# Patient Record
Sex: Female | Born: 1950 | Race: White | Hispanic: No | Smoking: Never smoker
Health system: Southern US, Community
[De-identification: ages and names within clinical notes are randomized; demographics above are authoritative.]

## PROBLEM LIST (undated history)

## (undated) DIAGNOSIS — R1031 Right lower quadrant pain: Secondary | ICD-10-CM

## (undated) DIAGNOSIS — I4510 Unspecified right bundle-branch block: Secondary | ICD-10-CM

## (undated) DIAGNOSIS — Z860100 Personal history of colon polyps, unspecified: Secondary | ICD-10-CM

## (undated) DIAGNOSIS — R51 Headache: Secondary | ICD-10-CM

## (undated) DIAGNOSIS — F32A Depression, unspecified: Secondary | ICD-10-CM

## (undated) DIAGNOSIS — R9389 Abnormal findings on diagnostic imaging of other specified body structures: Secondary | ICD-10-CM

## (undated) DIAGNOSIS — M797 Fibromyalgia: Secondary | ICD-10-CM

## (undated) DIAGNOSIS — E109 Type 1 diabetes mellitus without complications: Secondary | ICD-10-CM

## (undated) DIAGNOSIS — I10 Essential (primary) hypertension: Secondary | ICD-10-CM

## (undated) DIAGNOSIS — Z9641 Presence of insulin pump (external) (internal): Secondary | ICD-10-CM

## (undated) DIAGNOSIS — G4700 Insomnia, unspecified: Secondary | ICD-10-CM

## (undated) DIAGNOSIS — Z8639 Personal history of other endocrine, nutritional and metabolic disease: Secondary | ICD-10-CM

## (undated) DIAGNOSIS — F329 Major depressive disorder, single episode, unspecified: Secondary | ICD-10-CM

## (undated) DIAGNOSIS — R42 Dizziness and giddiness: Secondary | ICD-10-CM

## (undated) HISTORY — PX: COLONOSCOPY WITH PROPOFOL: SHX5780

## (undated) HISTORY — DX: Personal history of colonic polyps: Z86.010

## (undated) HISTORY — DX: Depression, unspecified: F32.A

## (undated) HISTORY — DX: Major depressive disorder, single episode, unspecified: F32.9

## (undated) HISTORY — DX: Personal history of colon polyps, unspecified: Z86.0100

## (undated) HISTORY — DX: Essential (primary) hypertension: I10

## (undated) HISTORY — DX: Insomnia, unspecified: G47.00

## (undated) HISTORY — DX: Headache: R51

## (undated) HISTORY — PX: TONSILLECTOMY: SUR1361

## (undated) HISTORY — DX: Fibromyalgia: M79.7

---

## 2003-02-13 ENCOUNTER — Other Ambulatory Visit: Admission: RE | Admit: 2003-02-13 | Discharge: 2003-02-13 | Payer: Self-pay | Admitting: Obstetrics and Gynecology

## 2005-01-13 ENCOUNTER — Other Ambulatory Visit: Admission: RE | Admit: 2005-01-13 | Discharge: 2005-01-13 | Payer: Self-pay | Admitting: Obstetrics and Gynecology

## 2006-02-01 ENCOUNTER — Encounter: Admission: RE | Admit: 2006-02-01 | Discharge: 2006-02-01 | Payer: Self-pay | Admitting: Obstetrics and Gynecology

## 2006-12-15 ENCOUNTER — Ambulatory Visit: Payer: Self-pay | Admitting: Family Medicine

## 2006-12-15 DIAGNOSIS — I10 Essential (primary) hypertension: Secondary | ICD-10-CM

## 2006-12-15 DIAGNOSIS — R51 Headache: Secondary | ICD-10-CM

## 2006-12-15 DIAGNOSIS — F329 Major depressive disorder, single episode, unspecified: Secondary | ICD-10-CM | POA: Insufficient documentation

## 2006-12-15 DIAGNOSIS — F3289 Other specified depressive episodes: Secondary | ICD-10-CM | POA: Insufficient documentation

## 2006-12-15 DIAGNOSIS — R079 Chest pain, unspecified: Secondary | ICD-10-CM

## 2006-12-15 DIAGNOSIS — R519 Headache, unspecified: Secondary | ICD-10-CM | POA: Insufficient documentation

## 2006-12-15 DIAGNOSIS — Z860100 Personal history of colon polyps, unspecified: Secondary | ICD-10-CM | POA: Insufficient documentation

## 2006-12-15 DIAGNOSIS — G4700 Insomnia, unspecified: Secondary | ICD-10-CM

## 2006-12-15 DIAGNOSIS — E785 Hyperlipidemia, unspecified: Secondary | ICD-10-CM | POA: Insufficient documentation

## 2006-12-17 ENCOUNTER — Telehealth: Payer: Self-pay | Admitting: Family Medicine

## 2007-01-03 ENCOUNTER — Encounter: Admission: RE | Admit: 2007-01-03 | Discharge: 2007-01-03 | Payer: Self-pay | Admitting: Obstetrics and Gynecology

## 2007-08-29 ENCOUNTER — Telehealth: Payer: Self-pay | Admitting: Family Medicine

## 2007-10-05 ENCOUNTER — Ambulatory Visit: Payer: Self-pay | Admitting: Family Medicine

## 2007-10-10 LAB — CONVERTED CEMR LAB
Albumin: 4 g/dL (ref 3.5–5.2)
Alkaline Phosphatase: 63 units/L (ref 39–117)
BUN: 17 mg/dL (ref 6–23)
Basophils Absolute: 0 10*3/uL (ref 0.0–0.1)
Cholesterol: 234 mg/dL (ref 0–200)
Eosinophils Absolute: 0.2 10*3/uL (ref 0.0–0.7)
Eosinophils Relative: 2.8 % (ref 0.0–5.0)
GFR calc Af Amer: 95 mL/min
GFR calc non Af Amer: 78 mL/min
HCT: 42.4 % (ref 36.0–46.0)
MCHC: 35.5 g/dL (ref 30.0–36.0)
MCV: 88.1 fL (ref 78.0–100.0)
Monocytes Absolute: 0.4 10*3/uL (ref 0.1–1.0)
Platelets: 225 10*3/uL (ref 150–400)
Potassium: 4 meq/L (ref 3.5–5.1)
RDW: 12.8 % (ref 11.5–14.6)
Sodium: 141 meq/L (ref 135–145)
TSH: 1 microintl units/mL (ref 0.35–5.50)
Total Bilirubin: 1 mg/dL (ref 0.3–1.2)
Triglycerides: 96 mg/dL (ref 0–149)

## 2007-11-01 ENCOUNTER — Telehealth: Payer: Self-pay | Admitting: Family Medicine

## 2008-03-21 ENCOUNTER — Telehealth: Payer: Self-pay | Admitting: Family Medicine

## 2008-05-17 ENCOUNTER — Ambulatory Visit: Payer: Self-pay | Admitting: Family Medicine

## 2008-05-17 LAB — CONVERTED CEMR LAB
Blood in Urine, dipstick: NEGATIVE
Glucose, Urine, Semiquant: NEGATIVE
Specific Gravity, Urine: 1.02
WBC Urine, dipstick: NEGATIVE
pH: 7

## 2008-05-21 ENCOUNTER — Encounter: Payer: Self-pay | Admitting: Family Medicine

## 2008-05-21 LAB — CONVERTED CEMR LAB
ALT: 18 units/L (ref 0–35)
AST: 18 units/L (ref 0–37)
Albumin: 3.6 g/dL (ref 3.5–5.2)
BUN: 17 mg/dL (ref 6–23)
Basophils Absolute: 0 10*3/uL (ref 0.0–0.1)
Chloride: 108 meq/L (ref 96–112)
Direct LDL: 124.5 mg/dL
Glucose, Bld: 97 mg/dL (ref 70–99)
Lymphocytes Relative: 29.7 % (ref 12.0–46.0)
Lymphs Abs: 1.6 10*3/uL (ref 0.7–4.0)
Monocytes Relative: 6.9 % (ref 3.0–12.0)
Platelets: 186 10*3/uL (ref 150.0–400.0)
Potassium: 3.9 meq/L (ref 3.5–5.1)
RDW: 12.6 % (ref 11.5–14.6)
TSH: 1.65 microintl units/mL (ref 0.35–5.50)

## 2008-05-29 ENCOUNTER — Telehealth (INDEPENDENT_AMBULATORY_CARE_PROVIDER_SITE_OTHER): Payer: Self-pay | Admitting: *Deleted

## 2008-07-20 ENCOUNTER — Telehealth: Payer: Self-pay | Admitting: Family Medicine

## 2008-09-05 ENCOUNTER — Telehealth: Payer: Self-pay | Admitting: Family Medicine

## 2008-09-10 ENCOUNTER — Telehealth (INDEPENDENT_AMBULATORY_CARE_PROVIDER_SITE_OTHER): Payer: Self-pay | Admitting: *Deleted

## 2008-10-25 ENCOUNTER — Telehealth: Payer: Self-pay | Admitting: Family Medicine

## 2008-11-19 ENCOUNTER — Ambulatory Visit: Payer: Self-pay | Admitting: Family Medicine

## 2008-11-19 DIAGNOSIS — IMO0001 Reserved for inherently not codable concepts without codable children: Secondary | ICD-10-CM | POA: Insufficient documentation

## 2008-11-23 ENCOUNTER — Telehealth: Payer: Self-pay | Admitting: *Deleted

## 2008-12-31 ENCOUNTER — Telehealth: Payer: Self-pay | Admitting: Family Medicine

## 2009-01-07 ENCOUNTER — Telehealth: Payer: Self-pay | Admitting: Family Medicine

## 2009-01-11 ENCOUNTER — Ambulatory Visit: Payer: Self-pay | Admitting: Family Medicine

## 2009-01-31 ENCOUNTER — Telehealth (INDEPENDENT_AMBULATORY_CARE_PROVIDER_SITE_OTHER): Payer: Self-pay | Admitting: *Deleted

## 2009-02-04 ENCOUNTER — Ambulatory Visit: Payer: Self-pay

## 2009-02-04 ENCOUNTER — Encounter (HOSPITAL_COMMUNITY): Admission: RE | Admit: 2009-02-04 | Discharge: 2009-04-01 | Payer: Self-pay | Admitting: Family Medicine

## 2009-02-04 ENCOUNTER — Ambulatory Visit: Payer: Self-pay | Admitting: Family Medicine

## 2009-02-04 ENCOUNTER — Ambulatory Visit: Payer: Self-pay | Admitting: Cardiology

## 2009-02-04 ENCOUNTER — Telehealth: Payer: Self-pay | Admitting: Family Medicine

## 2009-02-06 ENCOUNTER — Ambulatory Visit: Payer: Self-pay | Admitting: Family Medicine

## 2009-02-06 LAB — CONVERTED CEMR LAB
AST: 25 units/L (ref 0–37)
Albumin: 3.5 g/dL (ref 3.5–5.2)
Basophils Absolute: 0 10*3/uL (ref 0.0–0.1)
Bilirubin Urine: NEGATIVE
Chloride: 106 meq/L (ref 96–112)
Cholesterol: 226 mg/dL — ABNORMAL HIGH (ref 0–200)
Creatinine, Ser: 0.9 mg/dL (ref 0.4–1.2)
Direct LDL: 145.5 mg/dL
Eosinophils Relative: 2.4 % (ref 0.0–5.0)
GFR calc non Af Amer: 67.82 mL/min (ref >60)
Glucose, Urine, Semiquant: NEGATIVE
HCT: 45.7 % (ref 36.0–46.0)
Ketones, urine, test strip: NEGATIVE
Lymphocytes Relative: 31.3 % (ref 12.0–46.0)
Monocytes Relative: 6.7 % (ref 3.0–12.0)
Neutrophils Relative %: 58.9 % (ref 43.0–77.0)
Platelets: 213 10*3/uL (ref 150.0–400.0)
Potassium: 4 meq/L (ref 3.5–5.1)
Specific Gravity, Urine: >=1.03
TSH: 2.51 microintl units/mL (ref 0.35–5.50)
Total CHOL/HDL Ratio: 4
Total Protein: 6.8 g/dL (ref 6.0–8.3)
Triglycerides: 161 mg/dL — ABNORMAL HIGH (ref 0.0–149.0)
WBC: 5.8 10*3/uL (ref 4.5–10.5)

## 2009-02-07 ENCOUNTER — Encounter (INDEPENDENT_AMBULATORY_CARE_PROVIDER_SITE_OTHER): Payer: Self-pay | Admitting: *Deleted

## 2009-02-13 ENCOUNTER — Telehealth: Payer: Self-pay | Admitting: Family Medicine

## 2009-02-14 ENCOUNTER — Encounter (INDEPENDENT_AMBULATORY_CARE_PROVIDER_SITE_OTHER): Payer: Self-pay | Admitting: *Deleted

## 2009-02-20 ENCOUNTER — Ambulatory Visit: Payer: Self-pay | Admitting: Internal Medicine

## 2009-06-07 ENCOUNTER — Encounter: Admission: RE | Admit: 2009-06-07 | Discharge: 2009-06-07 | Payer: Self-pay | Admitting: Obstetrics and Gynecology

## 2009-08-08 ENCOUNTER — Telehealth: Payer: Self-pay | Admitting: Family Medicine

## 2009-12-31 ENCOUNTER — Encounter: Admission: RE | Admit: 2009-12-31 | Discharge: 2009-12-31 | Payer: Self-pay | Admitting: Obstetrics and Gynecology

## 2010-02-23 LAB — CONVERTED CEMR LAB
Albumin: 3.9 g/dL (ref 3.5–5.2)
Basophils Absolute: 0 10*3/uL (ref 0.0–0.1)
Bilirubin Urine: NEGATIVE
Bilirubin, Direct: 0.1 mg/dL (ref 0.0–0.3)
Blood in Urine, dipstick: NEGATIVE
Calcium: 9.5 mg/dL (ref 8.4–10.5)
Cholesterol: 199 mg/dL (ref 0–200)
Eosinophils Absolute: 0.1 10*3/uL (ref 0.0–0.6)
Eosinophils Relative: 1.7 % (ref 0.0–5.0)
GFR calc Af Amer: 95 mL/min
GFR calc non Af Amer: 78 mL/min
Glucose, Bld: 86 mg/dL (ref 70–99)
HDL: 53.2 mg/dL (ref >39.0)
Ketones, urine, test strip: NEGATIVE
Lymphocytes Relative: 32.2 % (ref 12.0–46.0)
MCHC: 34.8 g/dL (ref 30.0–36.0)
MCV: 87.9 fL (ref 78.0–100.0)
Neutro Abs: 4.6 10*3/uL (ref 1.4–7.7)
Neutrophils Relative %: 59.4 % (ref 43.0–77.0)
Platelets: 229 10*3/uL (ref 150–400)
RBC: 4.83 M/uL (ref 3.87–5.11)
Sodium: 140 meq/L (ref 135–145)
Specific Gravity, Urine: >=1.03
TSH: 1.41 microintl units/mL (ref 0.35–5.50)
Total CHOL/HDL Ratio: 3.7
Triglycerides: 122 mg/dL (ref 0–149)
WBC: 7.6 10*3/uL (ref 4.5–10.5)
pH: 5

## 2010-02-25 NOTE — Assessment & Plan Note (Signed)
Summary: CPX/MHF/RSC/CJR/pt rsc/cjr pt rsc/njr   Vital Signs:  Patient profile:   60 year old female Height:      64.25 inches Weight:      236 pounds Temp:     98.0 degrees F oral Pulse rate:   103 / minute BP sitting:   108 / 80  (left arm) Cuff size:   large  Vitals Entered By: Alfred Levins, CMA (February 06, 2009 9:32 AM) CC: cpx, no pap   History of Present Illness: 60 yr old female for cpx. She feels great today. She was seen last month for some chest discomfort that we felt was due to GERD. Indeed after starting Omeprazole daily these symptoms have disappeared. Her stress test was normal.   Current Medications (verified): 1)  Ambien 10 Mg  Tabs (Zolpidem Tartrate) .... At Bedtime 2)  Hyzaar 50-12.5 Mg Tabs (Losartan Potassium-Hctz) .... Take 1 By Mouth Once Daily. 3)  Flexeril 10 Mg Tabs (Cyclobenzaprine Hcl) .... Three Times A Day As Needed Spasm 4)  Omeprazole 40 Mg Cpdr (Omeprazole) .... Once Daily  Allergies (verified): No Known Drug Allergies  Past History:  Past Medical History: Colonic polyps, hx of Depression Headache Hypertension insomnia normal stress test on 02-04-09 sees Dr. Henderson Cloud for GYN exams  Past Surgical History: Tonsillectomy colonoscopy 02-2006 showing benign polyps  Family History: Reviewed history from 12/15/2006 and no changes required. Family History of Alcoholism/Addiction Family History Breast cancer 1st degree relative <50 Family History Uterine cancer Family History of Heart disease Family History of Aneurysm Abdominal  Social History: Reviewed history from 12/15/2006 and no changes required. Divorced Never Smoked Alcohol use-no Drug use-no Regular exercise-no  Review of Systems  The patient denies anorexia, fever, weight loss, weight gain, vision loss, decreased hearing, hoarseness, chest pain, syncope, dyspnea on exertion, peripheral edema, prolonged cough, headaches, hemoptysis, abdominal pain, melena, hematochezia,  severe indigestion/heartburn, hematuria, incontinence, genital sores, muscle weakness, suspicious skin lesions, transient blindness, difficulty walking, depression, unusual weight change, abnormal bleeding, enlarged lymph nodes, angioedema, breast masses, and testicular masses.    Physical Exam  General:  overweight-appearing.   Head:  Normocephalic and atraumatic without obvious abnormalities. No apparent alopecia or balding. Eyes:  No corneal or conjunctival inflammation noted. EOMI. Perrla. Funduscopic exam benign, without hemorrhages, exudates or papilledema. Vision grossly normal. Ears:  External ear exam shows no significant lesions or deformities.  Otoscopic examination reveals clear canals, tympanic membranes are intact bilaterally without bulging, retraction, inflammation or discharge. Hearing is grossly normal bilaterally. Nose:  External nasal examination shows no deformity or inflammation. Nasal mucosa are pink and moist without lesions or exudates. Mouth:  Oral mucosa and oropharynx without lesions or exudates.  Teeth in good repair. Neck:  No deformities, masses, or tenderness noted. Chest Wall:  No deformities, masses, or tenderness noted. Lungs:  Normal respiratory effort, chest expands symmetrically. Lungs are clear to auscultation, no crackles or wheezes. Heart:  Normal rate and regular rhythm. S1 and S2 normal without gallop, murmur, click, rub or other extra sounds. Abdomen:  Bowel sounds positive,abdomen soft and non-tender without masses, organomegaly or hernias noted. Msk:  No deformity or scoliosis noted of thoracic or lumbar spine.   Pulses:  R and L carotid,radial,femoral,dorsalis pedis and posterior tibial pulses are full and equal bilaterally Extremities:  No clubbing, cyanosis, edema, or deformity noted with normal full range of motion of all joints.   Neurologic:  No cranial nerve deficits noted. Station and gait are normal. Plantar reflexes are down-going bilaterally.  DTRs are symmetrical throughout. Sensory, motor and coordinative functions appear intact. Skin:  Intact without suspicious lesions or rashes Cervical Nodes:  No lymphadenopathy noted Axillary Nodes:  No palpable lymphadenopathy Inguinal Nodes:  No significant adenopathy Psych:  Cognition and judgment appear intact. Alert and cooperative with normal attention span and concentration. No apparent delusions, illusions, hallucinations   Impression & Recommendations:  Problem # 1:  HEALTH MAINTENANCE EXAM (ICD-V70.0)  Orders: UA Dipstick w/o Micro (manual) (56213)  Complete Medication List: 1)  Hyzaar 50-12.5 Mg Tabs (Losartan potassium-hctz) .... Take 1 by mouth once daily. 2)  Flexeril 10 Mg Tabs (Cyclobenzaprine hcl) .... Three times a day as needed spasm 3)  Temazepam 30 Mg Caps (Temazepam) .... At bedtime 4)  Omeprazole 20 Mg Tbec (Omeprazole) .... Once daily 5)  Prozac 10 Mg Caps (Fluoxetine hcl) .... Once daily 6)  Zocor 40 Mg Tabs (Simvastatin) .... Once daily  Other Orders: Gastroenterology Referral (GI)  Patient Instructions: 1)  It is important that you exercise reguarly at least 20 minutes 5 times a week. If you develop chest pain, have severe difficulty breathing, or feel very tired, stop exercising immediately and seek medical attention.  2)  You need to lose weight. Consider a lower calorie diet and regular exercise.  3)  Add Zocor, and recheck lipids in 90 days.  4)  switch from Ambien to Temazepam so insurance will cover it.  Prescriptions: ZOCOR 40 MG TABS (SIMVASTATIN) once daily  #30 x 11   Entered and Authorized by:   Nelwyn Salisbury MD   Signed by:   Nelwyn Salisbury MD on 02/06/2009   Method used:   Print then Give to Patient   RxID:   [id number] PROZAC 10 MG CAPS (FLUOXETINE HCL) once daily  #90 x 3   Entered and Authorized by:   Nelwyn Salisbury MD   Signed by:   Nelwyn Salisbury MD on 02/06/2009   Method used:   Print then Give to Patient   RxID:    [id number] QIHKVQ 50-12.5 MG TABS (LOSARTAN POTASSIUM-HCTZ) take 1 by mouth once daily.  #30 x 11   Entered and Authorized by:   Nelwyn Salisbury MD   Signed by:   Nelwyn Salisbury MD on 02/06/2009   Method used:   Print then Give to Patient   RxID:   [id number] TEMAZEPAM 30 MG CAPS (TEMAZEPAM) at bedtime  #30 x 5   Entered and Authorized by:   Nelwyn Salisbury MD   Signed by:   Nelwyn Salisbury MD on 02/06/2009   Method used:   Print then Give to Patient   RxID:   [id number]   Preventive Care Screening  Mammogram:    Date:  01/27/2008    Results:  normal   Last Tetanus Booster:    Date:  01/27/2003    Results:  Historical    Laboratory Results   Urine Tests  Date/Time Received: February 06, 2009 10:33 AM Date/Time Reported: February 06, 2009 10:33 AM  Routine Urinalysis   Color: lt. yellow Appearance: Clear Glucose: negative   (Normal Range: Negative) Bilirubin: negative   (Normal Range: Negative) Ketone: negative   (Normal Range: Negative) Spec. Gravity: >=1.030   (Normal Range: 1.003-1.035) Blood: negative   (Normal Range: Negative) pH: 5.0   (Normal Range: 5.0-8.0) Protein: negative   (Normal Range: Negative) Urobilinogen: 0.2   (Normal Range: 0-1) Nitrite: negative   (Normal Range: Negative) Leukocyte Esterace: negative   (Normal Range:  Negative)    Comments: Alfred Levins, CMA  February 06, 2009 10:33 AM

## 2010-02-25 NOTE — Miscellaneous (Signed)
Summary: [facility]  Clinical Lists Changes  Medications: Added new medication of MIRALAX   POWD (POLYETHYLENE GLYCOL 3350) As per prep  instructions. - Signed Added new medication of DULCOLAX 5 MG  TBEC (BISACODYL) Day before procedure take 2 at 3pm and 2 at 8pm. - Signed Added new medication of REGLAN 10 MG  TABS (METOCLOPRAMIDE HCL) As per prep instructions. - Signed Rx of MIRALAX   POWD (POLYETHYLENE GLYCOL 3350) As per prep  instructions.;  #255gm x 0;  Signed;  Entered by: Ezra Sites RN;  Authorized by: Hart Carwin MD;  Method used: Electronically to [facility] [address] [phone]*, [address], [place], [place], [place]  [address], Ph: [phone] or [phone], Fax: [phone] Rx of DULCOLAX 5 MG  TBEC (BISACODYL) Day before procedure take 2 at 3pm and 2 at 8pm.;  #4 x 0;  Signed;  Entered by: Ezra Sites RN;  Authorized by: Hart Carwin MD;  Method used: Electronically to [facility] [address] [phone]*, [address], [place], [place], [place]  [address], Ph: [phone] or [phone], Fax: [phone] Rx of REGLAN 10 MG  TABS (METOCLOPRAMIDE HCL) As per prep instructions.;  #2 x 0;  Signed;  Entered by: Ezra Sites RN;  Authorized by: Hart Carwin MD;  Method used: Electronically to [facility] [address] [phone]*, [address], [place], [place], [place]  [address], Ph: [phone] or [phone], Fax: [phone] Observations: Added new observation of NKA: T (02/20/2009 10:28)    Prescriptions: REGLAN 10 MG  TABS (METOCLOPRAMIDE HCL) As per prep instructions.  #2 x 0   Entered by:   Ezra Sites RN   Authorized by:   Hart Carwin MD   Signed by:   Ezra Sites RN on 02/20/2009   Method used:   Electronically to        [facility]. [address] [phone]* (retail)       [address]       [place], [place]  [address]       Ph: [phone] or [phone]       Fax: [phone]   RxID:   [id number] DULCOLAX 5 MG  TBEC (BISACODYL) Day before procedure  take 2 at 3pm and 2 at 8pm.  #4 x 0   Entered by:   Ezra Sites RN   Authorized by:   Hart Carwin MD   Signed by:   Ezra Sites RN on 02/20/2009   Method used:   Electronically to        [facility] [address] [phone]* (retail)       [address]       [place], [place]  [address]       Ph: [phone] or [phone]       Fax: [phone]   RxID:   [id number] MIRALAX   POWD (POLYETHYLENE GLYCOL 3350) As per prep  instructions.  #255gm x 0   Entered by:   Ezra Sites RN   Authorized by:   Hart Carwin MD   Signed by:   Ezra Sites RN on 02/20/2009   Method used:   Electronically to        [facility]. [address] [phone]* (retail)       [address]       [place], [place]  [address]       Ph: [phone] or [phone]       Fax: [phone]   RxID:   [id number]

## 2010-02-25 NOTE — Progress Notes (Signed)
Summary: Nuclear Pre-Procedure  Phone Note Outgoing Call Call back at [facility] Phone [phone]   Call placed by: Stanton Kidney, EMT-P,  January 31, 2009 1:32 PM Action Taken: Phone Call Completed Summary of Call: Left message with information on Myoview Information Sheet (see scanned document for details).     Nuclear Med Background Indications for Stress Test: Evaluation for Ischemia     Symptoms: Chest Pressure, Chest Tightness, SOB    Nuclear Pre-Procedure Cardiac Risk Factors: Hypertension, Lipids Height (in): 65

## 2010-02-25 NOTE — Letter (Signed)
Summary: Previsit letter  [facility]  [address] [place], [place] [address]   Phone: [phone]  Fax: [phone]       02/07/2009 MRN: [id number]  [facility] Katie Palmer [address] [place], [place]  [address]  Dear [name],  Welcome to the Gastroenterology Division at [facility].    You are scheduled to see a nurse for your pre-procedure visit on 02/18/2009 at 2:30PM on the 3rd floor at [facility], [address]  We ask that you try to arrive at our office 15 minutes prior to your appointment time to allow for check-in.  Your nurse visit will consist of discussing your medical and surgical history, your immediate family medical history, and your medications.    Please bring a complete list of all your medications or, if you prefer, bring the medication bottles and we will list them.  We will need to be aware of both prescribed and over the counter drugs.  We will need to know exact dosage information as well.  If you are on blood thinners (Coumadin, Plavix, Aggrenox, Ticlid, etc.) please call our office today/prior to your appointment, as we need to consult with your physician about holding your medication.   Please be prepared to read and sign documents such as consent forms, a financial agreement, and acknowledgement forms.  If necessary, and with your consent, a friend or relative is welcome to sit-in on the nurse visit with you.  Please bring your insurance card so that we may make a copy of it.  If your insurance requires a referral to see a specialist, please bring your referral form from your primary care physician.  No co-pay is required for this nurse visit.     If you cannot keep your appointment, please call [phone] to cancel or reschedule prior to your appointment date.  This allows [place] the opportunity to schedule an appointment for another patient in need of care.    Thank you for choosing [facility] for your medical needs.   We appreciate the opportunity to care for you.  Please visit [place] at our website  to learn more about our practice.                     Sincerely.                                                                                                                   The Gastroenterology Division

## 2010-02-25 NOTE — Progress Notes (Signed)
Summary: pharmacy needs to verify dose  Phone Note From Pharmacy Call back at [phone]   Caller: [facility] [address] [phone]* Call For: Katie Palmer  Summary of Call: pt was on celexa 20mg  and it was changed to Prozac 10mg  but back in 2009 Dr Henderson Cloud gave her 20mg  and pharmacy is calling to see if you wanted to increase the dose Initial call taken by: Alfred Levins, CMA,  February 13, 2009 12:04 PM  Follow-up for Phone Call        change the Prozac to 20 mg a day Follow-up by: Nelwyn Salisbury MD,  February 13, 2009 1:50 PM  Additional Follow-up for Phone Call Additional follow up Details #1::        Phone call completed, Pharmacist called Additional Follow-up by: Alfred Levins, CMA,  February 13, 2009 5:03 PM    New/Updated Medications: FLUOXETINE HCL 20 MG  TABS (FLUOXETINE HCL) Take 1 tab by mouth daily Prescriptions: FLUOXETINE HCL 20 MG  TABS (FLUOXETINE HCL) Take 1 tab by mouth daily  #90 x 3   Entered by:   Alfred Levins, CMA   Authorized by:   Nelwyn Salisbury MD   Signed by:   Alfred Levins, CMA on 02/13/2009   Method used:   Electronically to        [facility] [address] [phone]* (retail)       [address]       [place], [place]  [address]       Ph: [phone] or [phone]       Fax: [phone]   RxID:   [id number]

## 2010-02-25 NOTE — Letter (Signed)
Summary: [facility] Instructions  [facility]  [address] [place], [place] [address]   Phone: [phone]  Fax: [phone]       Katie Palmer    1950-06-12    MRN: [id number]       Procedure Day /Date: Thursday 02/28/09       Arrival Time:  8:00 AM       Procedure Time: 9:00 AM      Location of Procedure:                    [facility] (4th Floor)    PREPARATION FOR COLONOSCOPY WITH MIRALAX  Starting 5 days prior to your procedure Saturday 02/23/09  do not eat nuts, seeds, popcorn, corn, beans, peas,  salads, or any raw vegetables.  Do not take any fiber supplements (e.g. Metamucil, Citrucel, and Benefiber). ____________________________________________________________________________________________________   THE DAY BEFORE YOUR PROCEDURE         DATE:  02/27/09   DAY:  Wednesday   1   Drink clear liquids the entire day-NO SOLID FOOD  2   Do not drink anything colored red or purple.  Avoid juices with pulp.  No orange juice.  3   Drink at least 64 oz. (8 glasses) of fluid/clear liquids during the day to prevent dehydration and help the prep work efficiently.  CLEAR LIQUIDS INCLUDE: Water Jello Ice Popsicles Tea (sugar ok, no milk/cream) Powdered fruit flavored drinks Coffee (sugar ok, no milk/cream) Gatorade Juice: apple, white grape, white cranberry  Lemonade Clear bullion, consomm, broth Carbonated beverages (any kind) Strained chicken noodle soup Hard Candy  4   Mix the entire bottle of Miralax with 64 oz. of Gatorade/Powerade in the morning and put in the refrigerator to chill.  5   At 3:00 pm take 2 Dulcolax/Bisacodyl tablets.  6   At 4:30 pm take one Reglan/Metoclopramide tablet.  7  Starting at 5:00 pm drink one 8 oz glass of the Miralax mixture every 15-20 minutes until you have finished drinking the entire 64 oz.  You should finish drinking prep around 7:30 or 8:00 pm.  8   If you are nauseated, you may take the 2nd  Reglan/Metoclopramide tablet at 6:30 pm.        9    At 8:00 pm take 2 more DULCOLAX/Bisacodyl tablets.     THE DAY OF YOUR PROCEDURE      DATE:  _2/3/11  _ DAY: _Thursday  _  You may drink clear liquids until  7:00 AM    (2 HOURS BEFORE PROCEDURE).   MEDICATION INSTRUCTIONS  Unless otherwise instructed, you should take regular prescription medications with a small sip of water as early as possible the morning of your procedure.   Additional medication instructions: Do not take Losartin/HCTZ morning of procedure.         OTHER INSTRUCTIONS  You will need a responsible adult at least 62 years of age to accompany you and drive you home.   This person must remain in the waiting room during your procedure.  Wear loose fitting clothing that is easily removed.  Leave jewelry and other valuables at home.  However, you may wish to bring a book to read or an iPod/MP3 player to listen to music as you wait for your procedure to start.  Remove all body piercing jewelry and leave at home.  Total time from sign-in until discharge is approximately 2-3 hours.  You should go home  directly after your procedure and rest.  You can resume normal activities the day after your procedure.  The day of your procedure you should not:   Drive   Make legal decisions   Operate machinery   Drink alcohol   Return to work  You will receive specific instructions about eating, activities and medications before you leave.   The above instructions have been reviewed and explained to me by   Ezra Sites RN  February 20, 2009 10:50 AM     I fully understand and can verbalize these instructions _____________________________ Date _______

## 2010-02-25 NOTE — Progress Notes (Signed)
Summary: refill Temazepam  Phone Note Refill Request Message from:  Fax from Pharmacy on August 08, 2009 10:53 AM  Refills Requested: Medication #1:  TEMAZEPAM 30 MG CAPS at bedtime   Dosage confirmed as above?Dosage Confirmed   Supply Requested: 3 months   Last Refilled: 07/08/2009   Notes: 90 day supply  Method Requested: Fax to Local Pharmacy Initial call taken by: Raechel Ache, RN,  August 08, 2009 10:53 AM [facility]: [facility]  Follow-up for Phone Call        call in #90 x3 Follow-up by: Nelwyn Salisbury MD,  August 08, 2009 12:08 PM  Additional Follow-up for Phone Call Additional follow up Details #1::        Rx faxed to pharmacy    Prescriptions: TEMAZEPAM 30 MG CAPS (TEMAZEPAM) at bedtime  #90 x 3   Entered by:   Raechel Ache, RN   Authorized by:   Nelwyn Salisbury MD   Signed by:   Raechel Ache, RN on 08/08/2009   Method used:   Historical   RxID:   [id number]

## 2010-02-25 NOTE — Assessment & Plan Note (Signed)
Summary: stress only/dx:chest pain/wt:239/ins:bcbs/dr fry  Nuclear Med Background Indications for Stress Test: Evaluation for Ischemia    History Comments: NO DOCUMENTED CAD  Symptoms: Chest Pressure, Chest Tightness, Fatigue, SOB  Symptoms Comments: Last episode of CP:2 weeks ago.   Nuclear Pre-Procedure Cardiac Risk Factors: Hypertension, Lipids, Obesity Caffeine/Decaff Intake: None NPO After: 7:30 PM Lungs: Clear IV 0.9% NS with Angio Cath: 20g     IV Site: (R) AC IV Started by: Stanton Kidney EMT-P Chest Size (in) 40     Cup Size DD     Height (in): 65 Weight (lb): 234 BMI: 39.08  Nuclear Med Study 1 or 2 day study:  1 day     Stress Test Type:  Stress Reading MD:  Olga Millers, MD     Referring MD:  Gershon Crane, MD Resting Radionuclide:  Technetium 16m Tetrofosmin     Resting Radionuclide Dose:  11.0 mCi  Stress Radionuclide:  Technetium 58m Tetrofosmin     Stress Radionuclide Dose:  33.0 mCi   Stress Protocol Exercise Time (min):  8:31 min     Max HR:  162 bpm     Predicted Max HR:  160 bpm  Max Systolic BP: 172 mm Hg     Percent Max HR:  101.25 %     METS: 10.4 Rate Pressure Product:  45409    Stress Test Technologist:  Rea [employer] CMA-N     Nuclear Technologist:  Burna Mortimer Deal RT-N  Rest Procedure  Myocardial perfusion imaging was performed at rest 45 minutes following the intravenous administration of Myoview Technetium 92m Tetrofosmin.  Stress Procedure  The patient exercised for 8:31.  The patient stopped due to fatigue and denied any chest pain.  There were no diagnostic ST-T wave changes.  Myoview was injected at peak exercise and myocardial perfusion imaging was performed after a brief delay.  QPS Raw Data Images:  Acuisition technically good; normal left ventricular size. Stress Images:  There is normal uptake in all areas. Rest Images:  Normal homogeneous uptake in all areas of the myocardium. Subtraction (SDS):  No evidence of ischemia. Transient  Ischemic Dilatation:  .90  (Normal <1.22)  Lung/Heart Ratio:  .31  (Normal <0.45)  Quantitative Gated Spect Images QGS EDV:  56 ml QGS ESV:  14 ml QGS EF:  75 % QGS cine images:  Normal wall motion.   Overall Impression  Exercise Capacity: Fair exercise capacity. BP Response: Normal blood pressure response. Clinical Symptoms: There is dyspnea. ECG Impression: No significant ST segment change suggestive of ischemia. Overall Impression: There is no sign of scar or ischemia.

## 2010-02-25 NOTE — Miscellaneous (Signed)
Summary: Labs Ordered  Clinical Lists Changes  Problems: Added new problem of HEALTH SCREENING (ICD-V70.0) Orders: Added new Test order of TLB-CBC Platelet - w/Differential (85025-CBCD) - Signed Added new Test order of TLB-BMP (Basic Metabolic Panel-BMET) (80048-METABOL) - Signed Added new Test order of TLB-TSH (Thyroid Stimulating Hormone) (84443-TSH) - Signed Added new Test order of TLB-Hepatic/Liver Function Pnl (80076-HEPATIC) - Signed

## 2010-02-25 NOTE — Progress Notes (Signed)
  Phone Note From Other Clinic   Caller: Nurse (Patsy w/ [facility]) / Merita Norton - Lab Request: Talk with Provider Action Taken: Provider Notified Summary of Call: Patsy w/ [facility] called to adv that pt was at their facility today for a nuc med study / stress test and since they were starting an IV on the pt, they would go ahead and do bld draw so that pt will not have to come over to have CPX Labs drawn at [facility]...... Pt will have CPX Labs (CBC diff, BMET, TSH, Lipid, Hepatic Panel).... Pt will have UA done at time of her appt for CPX w/ Dr. Clent Ridges..... Merita Norton at [facility] aware of same per Patsy.  Initial call taken by: Debbra Riding,  February 04, 2009 9:08 AM

## 2010-03-18 ENCOUNTER — Other Ambulatory Visit: Payer: Self-pay

## 2010-03-19 MED ORDER — TEMAZEPAM 30 MG PO CAPS: 30.0000 mg | ORAL_CAPSULE | Freq: Every evening | ORAL | Status: AC | PRN

## 2010-03-19 NOTE — Telephone Encounter (Signed)
Temazepam called to [facility]  #30 w/5 refills

## 2010-03-19 NOTE — Telephone Encounter (Signed)
Call in #30 with 5 rf 

## 2010-04-30 ENCOUNTER — Other Ambulatory Visit: Payer: Self-pay | Admitting: Family Medicine

## 2010-07-21 ENCOUNTER — Other Ambulatory Visit: Payer: Self-pay | Admitting: Family Medicine

## 2010-08-05 ENCOUNTER — Encounter: Payer: Self-pay | Admitting: Cardiology

## 2010-09-23 ENCOUNTER — Telehealth: Payer: Self-pay | Admitting: Family Medicine

## 2010-09-23 NOTE — Telephone Encounter (Signed)
Refill request for Temazepam 30 mg take 1 po qhs. Pt last here on 02/06/09 and script last filled on 08/22/10.

## 2010-09-24 ENCOUNTER — Other Ambulatory Visit: Payer: Self-pay | Admitting: Family Medicine

## 2010-09-24 NOTE — Telephone Encounter (Signed)
NO refills until she sees [place] again

## 2010-09-24 NOTE — Telephone Encounter (Signed)
Left below message. 

## 2010-09-24 NOTE — Telephone Encounter (Signed)
Refused, pt needs office visit and pt aware.

## 2010-11-13 ENCOUNTER — Other Ambulatory Visit: Payer: Self-pay | Admitting: Family Medicine

## 2010-11-13 NOTE — Telephone Encounter (Signed)
Refill request for Hyzaar 50-12.5 mg and pt has not been seen since 02/06/10 and took last pill today.

## 2010-11-13 NOTE — Telephone Encounter (Signed)
Pt called req refill of losartan-hydrochlorothiazide (HYZAAR) 50-12.5 MG per tablet to [facility]. Pt took last pill today.

## 2010-11-14 MED ORDER — LOSARTAN POTASSIUM-HCTZ 50-12.5 MG PO TABS: 1.0000 | ORAL_TABLET | Freq: Every day | ORAL | Status: DC

## 2010-11-14 NOTE — Telephone Encounter (Signed)
Call in one year supply  

## 2010-11-14 NOTE — Telephone Encounter (Signed)
Script sent e-scribe 

## 2010-12-12 ENCOUNTER — Other Ambulatory Visit: Payer: Self-pay

## 2010-12-15 ENCOUNTER — Other Ambulatory Visit (INDEPENDENT_AMBULATORY_CARE_PROVIDER_SITE_OTHER): Payer: BC Managed Care – PPO

## 2010-12-15 DIAGNOSIS — Z0000 Encounter for general adult medical examination without abnormal findings: Secondary | ICD-10-CM

## 2010-12-15 LAB — HEPATIC FUNCTION PANEL
ALT: 17 U/L (ref 0–35)
AST: 20 U/L (ref 0–37)
Alkaline Phosphatase: 80 U/L (ref 39–117)
Total Bilirubin: 0.6 mg/dL (ref 0.3–1.2)

## 2010-12-15 LAB — LIPID PANEL
Cholesterol: 188 mg/dL (ref 0–200)
LDL Cholesterol: 112 mg/dL — ABNORMAL HIGH (ref 0–99)
VLDL: 14.8 mg/dL (ref 0.0–40.0)

## 2010-12-15 LAB — CBC WITH DIFFERENTIAL/PLATELET
Basophils Absolute: 0 10*3/uL (ref 0.0–0.1)
Eosinophils Relative: 2.5 % (ref 0.0–5.0)
HCT: 44.9 % (ref 36.0–46.0)
Hemoglobin: 15.3 g/dL — ABNORMAL HIGH (ref 12.0–15.0)
Lymphocytes Relative: 30.1 % (ref 12.0–46.0)
Lymphs Abs: 1.7 10*3/uL (ref 0.7–4.0)
Monocytes Relative: 6.5 % (ref 3.0–12.0)
Neutro Abs: 3.5 10*3/uL (ref 1.4–7.7)
Platelets: 223 10*3/uL (ref 150.0–400.0)
RDW: 14 % (ref 11.5–14.6)
WBC: 5.8 10*3/uL (ref 4.5–10.5)

## 2010-12-15 LAB — BASIC METABOLIC PANEL
Calcium: 8.8 mg/dL (ref 8.4–10.5)
GFR: 78.35 mL/min (ref >60.00)
Potassium: 4.3 mEq/L (ref 3.5–5.1)
Sodium: 140 mEq/L (ref 135–145)

## 2010-12-15 LAB — POCT URINALYSIS DIPSTICK
Glucose, UA: NEGATIVE
Nitrite, UA: NEGATIVE
Protein, UA: NEGATIVE
Spec Grav, UA: >=1.03
Urobilinogen, UA: 0.2

## 2010-12-15 LAB — TSH: TSH: 1.35 u[IU]/mL (ref 0.35–5.50)

## 2010-12-16 ENCOUNTER — Encounter: Payer: Self-pay | Admitting: Family Medicine

## 2010-12-16 NOTE — Progress Notes (Signed)
Quick Note:  Spoke with pt and gave results, also put a copy in mail per pt request. ______

## 2010-12-17 ENCOUNTER — Encounter: Payer: Self-pay | Admitting: Family Medicine

## 2010-12-22 ENCOUNTER — Encounter: Payer: Self-pay | Admitting: Family Medicine

## 2010-12-22 ENCOUNTER — Ambulatory Visit (INDEPENDENT_AMBULATORY_CARE_PROVIDER_SITE_OTHER): Payer: BC Managed Care – PPO | Admitting: Family Medicine

## 2010-12-22 VITALS — BP 124/86 | HR 95 | Temp 98.8°F | Ht 63.75 in | Wt 241.0 lb

## 2010-12-22 DIAGNOSIS — Z23 Encounter for immunization: Secondary | ICD-10-CM

## 2010-12-22 DIAGNOSIS — E538 Deficiency of other specified B group vitamins: Secondary | ICD-10-CM

## 2010-12-22 DIAGNOSIS — E559 Vitamin D deficiency, unspecified: Secondary | ICD-10-CM

## 2010-12-22 DIAGNOSIS — Z0000 Encounter for general adult medical examination without abnormal findings: Secondary | ICD-10-CM

## 2010-12-22 LAB — VITAMIN B12: Vitamin B-12: 734 pg/mL (ref 211–911)

## 2010-12-22 MED ORDER — SUMATRIPTAN SUCCINATE 100 MG PO TABS: 100.0000 mg | ORAL_TABLET | Freq: Once | ORAL | Status: DC | PRN

## 2010-12-22 MED ORDER — DULOXETINE HCL 60 MG PO CPEP: 60.0000 mg | ORAL_CAPSULE | Freq: Every day | ORAL | Status: DC

## 2010-12-22 MED ORDER — LOSARTAN POTASSIUM-HCTZ 50-12.5 MG PO TABS: 1.0000 | ORAL_TABLET | Freq: Every day | ORAL | Status: DC

## 2010-12-22 MED ORDER — ALPRAZOLAM 1 MG PO TABS: 1.0000 mg | ORAL_TABLET | Freq: Every evening | ORAL | Status: DC | PRN

## 2010-12-22 NOTE — Progress Notes (Signed)
  Subjective:    Patient ID: Katie Palmer, female    DOB: 12-15-1950, 60 y.o.   MRN: [id number]  HPI 60 yr old female for a cpx. She complains of her usual issues including muscle aches, fibromyalgia, fatigue, and headaches. She would like to try imitrex. She has used Xanax for sleep with good results and wants to try it again.    Review of Systems  Constitutional: Negative.   HENT: Negative.   Eyes: Negative.   Respiratory: Negative.   Cardiovascular: Negative.   Gastrointestinal: Negative.   Genitourinary: Negative for dysuria, urgency, frequency, hematuria, flank pain, decreased urine volume, enuresis, difficulty urinating, pelvic pain and dyspareunia.  Musculoskeletal: Negative.   Skin: Negative.   Neurological: Negative.   Hematological: Negative.   Psychiatric/Behavioral: Negative.        Objective:   Physical Exam  Constitutional: She is oriented to person, place, and time. She appears well-developed and well-nourished. No distress.  HENT:  Head: Normocephalic and atraumatic.  Right Ear: External ear normal.  Left Ear: External ear normal.  Nose: Nose normal.  Mouth/Throat: Oropharynx is clear and moist. No oropharyngeal exudate.  Eyes: Conjunctivae and EOM are normal. Pupils are equal, round, and reactive to light. No scleral icterus.  Neck: Normal range of motion. Neck supple. No JVD present. No thyromegaly present.  Cardiovascular: Normal rate, regular rhythm, normal heart sounds and intact distal pulses.  Exam reveals no gallop and no friction rub.   No murmur heard.      EKG stable with RAFB  Pulmonary/Chest: Effort normal and breath sounds normal. No respiratory distress. She has no wheezes. She has no rales. She exhibits no tenderness.  Abdominal: Soft. Bowel sounds are normal. She exhibits no distension and no mass. There is no tenderness. There is no rebound and no guarding.  Musculoskeletal: Normal range of motion. She exhibits no edema and no tenderness.    Lymphadenopathy:    She has no cervical adenopathy.  Neurological: She is alert and oriented to person, place, and time. She has normal reflexes. No cranial nerve deficit. She exhibits normal muscle tone. Coordination normal.  Skin: Skin is warm and dry. No rash noted. No erythema.  Psychiatric: She has a normal mood and affect. Her behavior is normal. Judgment and thought content normal.          Assessment & Plan:  Get levels for vitamins B12 and D. Try Xanax for sleep.

## 2010-12-22 NOTE — Progress Notes (Signed)
Addended by: Aniceto Boss A on: 12/22/2010 02:54 PM   Modules accepted: Orders

## 2010-12-23 ENCOUNTER — Other Ambulatory Visit: Payer: Self-pay | Admitting: Obstetrics and Gynecology

## 2010-12-23 ENCOUNTER — Telehealth: Payer: Self-pay | Admitting: Family Medicine

## 2010-12-23 DIAGNOSIS — R921 Mammographic calcification found on diagnostic imaging of breast: Secondary | ICD-10-CM

## 2010-12-23 NOTE — Telephone Encounter (Signed)
Pt was here yesterday for her cpx. She had declined at that time to get a refill on her Flexeril, but has changed her mind and has none or nearly none left. Can you refill? Please have Nettie Elm send to [facility] on [address]

## 2010-12-25 MED ORDER — ERGOCALCIFEROL 1.25 MG (50000 UT) PO CAPS: 50000.0000 [IU] | ORAL_CAPSULE | ORAL | Status: DC

## 2010-12-25 MED ORDER — CYCLOBENZAPRINE HCL 10 MG PO TABS: 10.0000 mg | ORAL_TABLET | Freq: Three times a day (TID) | ORAL | Status: DC | PRN

## 2010-12-25 NOTE — Progress Notes (Signed)
Quick Note:  Spoke with pt and also sent script e-scribe ______

## 2010-12-25 NOTE — Telephone Encounter (Signed)
Call in #60 with 11 rf 

## 2010-12-25 NOTE — Telephone Encounter (Signed)
Script sent e-scribe and pt aware. 

## 2010-12-25 NOTE — Progress Notes (Signed)
Addended by: Aniceto Boss A on: 12/25/2010 12:43 PM   Modules accepted: Orders

## 2011-01-07 ENCOUNTER — Ambulatory Visit
Admission: RE | Admit: 2011-01-07 | Discharge: 2011-01-07 | Disposition: A | Discharge status: Home / Self Care | Payer: BC Managed Care – PPO | Source: Ambulatory Visit | Attending: Obstetrics and Gynecology | Admitting: Obstetrics and Gynecology

## 2011-01-07 DIAGNOSIS — R921 Mammographic calcification found on diagnostic imaging of breast: Secondary | ICD-10-CM

## 2011-03-03 ENCOUNTER — Telehealth: Payer: Self-pay | Admitting: Family Medicine

## 2011-03-03 NOTE — Telephone Encounter (Signed)
Pt called and lft her ALPRAZolam (XANAX) 1 MG tablet meds in [place] in hotel. Pt is req that Dr Clent Ridges call [facility] at [facility] [facility] and [address] [phone] and ok the refill for Alprazolam 1 mg. Pt having migraines and can not sleep.

## 2011-03-03 NOTE — Telephone Encounter (Signed)
Please advise 

## 2011-03-03 NOTE — Telephone Encounter (Signed)
Call in #90 with no rf 

## 2011-03-04 MED ORDER — ALPRAZOLAM 1 MG PO TABS: 1.0000 mg | ORAL_TABLET | Freq: Every evening | ORAL | Status: DC | PRN

## 2011-03-04 NOTE — Telephone Encounter (Signed)
Pt called back to check on status of getting refill of Alprazolam to [facility] [phone]

## 2011-03-04 NOTE — Telephone Encounter (Signed)
Script called in

## 2011-05-20 ENCOUNTER — Ambulatory Visit: Payer: BC Managed Care – PPO | Admitting: Family Medicine

## 2011-05-21 ENCOUNTER — Other Ambulatory Visit: Payer: BC Managed Care – PPO

## 2011-05-22 LAB — VITAMIN D 25 HYDROXY (VIT D DEFICIENCY, FRACTURES): Vit D, 25-Hydroxy: 92 ng/mL — ABNORMAL HIGH (ref 30–89)

## 2011-05-25 NOTE — Progress Notes (Signed)
Quick Note:  Spoke with pt ______ 

## 2011-05-27 ENCOUNTER — Ambulatory Visit (INDEPENDENT_AMBULATORY_CARE_PROVIDER_SITE_OTHER): Payer: BC Managed Care – PPO | Admitting: Family Medicine

## 2011-05-27 ENCOUNTER — Encounter: Payer: Self-pay | Admitting: Family Medicine

## 2011-05-27 VITALS — BP 118/78 | HR 92 | Temp 98.5°F | Wt 237.0 lb

## 2011-05-27 DIAGNOSIS — IMO0001 Reserved for inherently not codable concepts without codable children: Secondary | ICD-10-CM

## 2011-05-27 DIAGNOSIS — I10 Essential (primary) hypertension: Secondary | ICD-10-CM

## 2011-05-27 DIAGNOSIS — M797 Fibromyalgia: Secondary | ICD-10-CM | POA: Insufficient documentation

## 2011-05-27 MED ORDER — TRAMADOL HCL 50 MG PO TABS: 50.0000 mg | ORAL_TABLET | ORAL | Status: AC | PRN

## 2011-05-27 MED ORDER — ALPRAZOLAM 1 MG PO TABS: 1.0000 mg | ORAL_TABLET | Freq: Every evening | ORAL | Status: DC | PRN

## 2011-05-27 MED ORDER — MELOXICAM 15 MG PO TABS: 15.0000 mg | ORAL_TABLET | Freq: Every day | ORAL | Status: AC

## 2011-05-27 NOTE — Progress Notes (Signed)
  Subjective:    Patient ID: Katie Palmer, female    DOB: 1950-12-10, 61 y.o.   MRN: [id number]  HPI Here to follow up fibromyalgia and vitamin D deficiency. We discovered her low vitamin D level last Novemeber at her cpx, and she took 12 weeks of supplementation. This got her level well up to normal, but this had no effect on how she feels unfortunately. She is still tired all the time and aches from head to toe. She has been taking 800 mg of Motrin 3-4 times a day, and this helps a little. She has also tried some of her son's Tramadol, and this is helpful.    Review of Systems  Constitutional: Positive for fatigue.  Respiratory: Negative.   Cardiovascular: Negative.   Musculoskeletal: Positive for myalgias.       Objective:   Physical Exam  Constitutional: She appears well-developed and well-nourished.  Cardiovascular: Normal rate, regular rhythm, normal heart sounds and intact distal pulses.   Pulmonary/Chest: Effort normal and breath sounds normal.          Assessment & Plan:  She will try Meloxicam daily and add prn Tramadol. She needs to get more exercise so she plans to walk her dog in the park every day. She will take OTC vitamin D at 2000 units a day.

## 2011-06-11 ENCOUNTER — Telehealth: Payer: Self-pay | Admitting: Family Medicine

## 2011-06-11 NOTE — Telephone Encounter (Signed)
Pt is running out of samples of Cymbalta. Pt says that Dr Clent Ridges told pt to call when pt is running low. Pt needs to discuss some things with Dr Clent Ridges or Nurse. Pls call.

## 2011-06-12 NOTE — Telephone Encounter (Signed)
I tried to reach pt and no answer.  

## 2011-06-15 NOTE — Telephone Encounter (Signed)
I spoke with pt  

## 2011-06-15 NOTE — Telephone Encounter (Signed)
Samples are ready for pick up and I spoke with pt. She wants to know what you think about the lap band procedure? She has discussed her weight problem with you but not the procedure.

## 2011-06-15 NOTE — Telephone Encounter (Signed)
I would not advise this for her because she does not have life threatening effects from her weight (like diabetes, heart disease, etc). The procedure has some risks, and these outweigh the benefits

## 2011-09-07 ENCOUNTER — Other Ambulatory Visit: Payer: Self-pay | Admitting: Family Medicine

## 2011-09-07 NOTE — Telephone Encounter (Signed)
Pt called and said that [facility] had sent over a refill req last week re: traMADol (ULTRAM) 50 MG tablet, but hasn't gotten a response. Pt is completely out of med. Pt is taking 2 every 4 hrs for pain. Pls call in today.

## 2011-09-07 NOTE — Telephone Encounter (Signed)
Last written 05/27/11 # [phone] 5Rf Please advise Last seen 05/27/11

## 2011-09-07 NOTE — Telephone Encounter (Signed)
Please advise on RF 

## 2011-09-08 NOTE — Telephone Encounter (Signed)
Per dr. Clent Ridges - ok to refill- change directions to include "max dose 8 tablets in 24hrs."

## 2011-09-08 NOTE — Telephone Encounter (Signed)
Please advise on tramadol refill.  

## 2011-09-08 NOTE — Telephone Encounter (Signed)
Patient calling back again. States she is in so much pain 10/10. States she thought about taking the Ibuprofen again, but said Dr. Clent Ridges did not want her to take it. Please let her know when the refill is sent! Thanks.

## 2011-09-20 ENCOUNTER — Other Ambulatory Visit: Payer: Self-pay | Admitting: Family Medicine

## 2011-09-21 ENCOUNTER — Telehealth: Payer: Self-pay | Admitting: Family Medicine

## 2011-09-21 MED ORDER — TRAMADOL HCL 50 MG PO TABS: 50.0000 mg | ORAL_TABLET | ORAL | Status: DC | PRN

## 2011-09-21 NOTE — Telephone Encounter (Signed)
Patient called stating that she need a refill of her tramdol sent to [facility] on [address]. As she is completely out. Please assist.

## 2011-09-21 NOTE — Telephone Encounter (Signed)
Dr. Fabian Sharp did approve # 10 and I called in to pharmacy. Pt did not want to wait until tomorrow, next time she will call in 3 days before running out.

## 2011-09-21 NOTE — Telephone Encounter (Signed)
Pt is completely out of tramadol

## 2011-09-22 MED ORDER — TRAMADOL HCL 50 MG PO TABS: 50.0000 mg | ORAL_TABLET | ORAL | Status: DC | PRN

## 2011-09-22 NOTE — Telephone Encounter (Signed)
I called in script 

## 2011-09-22 NOTE — Telephone Encounter (Signed)
Call in #60 with 5 rf 

## 2011-11-20 ENCOUNTER — Other Ambulatory Visit: Payer: Self-pay | Admitting: Family Medicine

## 2011-11-24 ENCOUNTER — Telehealth: Payer: Self-pay | Admitting: Family Medicine

## 2011-11-24 NOTE — Telephone Encounter (Signed)
Pt called and said that she needs more samples of either the 30mg  or 60mg  Cymbalta. Pls call when ready for pick up.

## 2011-11-25 ENCOUNTER — Other Ambulatory Visit: Payer: Self-pay | Admitting: Family Medicine

## 2011-11-25 NOTE — Telephone Encounter (Signed)
Patient called stating that the drug store has been trying to get a refill of her tramadol as she is out of refills. Please assist and also assist with previous message.

## 2011-11-25 NOTE — Telephone Encounter (Signed)
I spoke with pharmacy and pt is picking up the Tramadol at least twice a month and there is no more refills.

## 2011-11-26 MED ORDER — TRAMADOL HCL 50 MG PO TABS: 50.0000 mg | ORAL_TABLET | ORAL | Status: DC | PRN

## 2011-11-26 NOTE — Telephone Encounter (Signed)
Call in #120 with 5 rf 

## 2011-11-26 NOTE — Telephone Encounter (Signed)
[facility] called to check on status of pts Tramadol. Pt has been out since Friday.

## 2011-11-26 NOTE — Telephone Encounter (Signed)
I called in script and spoke with pt. ( out of samples )

## 2011-12-02 ENCOUNTER — Other Ambulatory Visit: Payer: Self-pay | Admitting: Obstetrics and Gynecology

## 2011-12-02 DIAGNOSIS — Z1231 Encounter for screening mammogram for malignant neoplasm of breast: Secondary | ICD-10-CM

## 2011-12-17 ENCOUNTER — Other Ambulatory Visit: Payer: BC Managed Care – PPO

## 2011-12-21 ENCOUNTER — Other Ambulatory Visit (INDEPENDENT_AMBULATORY_CARE_PROVIDER_SITE_OTHER): Payer: BC Managed Care – PPO

## 2011-12-21 DIAGNOSIS — Z0000 Encounter for general adult medical examination without abnormal findings: Secondary | ICD-10-CM

## 2011-12-21 LAB — BASIC METABOLIC PANEL
BUN: 20 mg/dL (ref 6–23)
Chloride: 106 mEq/L (ref 96–112)
Glucose, Bld: 104 mg/dL — ABNORMAL HIGH (ref 70–99)
Potassium: 3.7 mEq/L (ref 3.5–5.1)
Sodium: 138 mEq/L (ref 135–145)

## 2011-12-21 LAB — CBC WITH DIFFERENTIAL/PLATELET
Eosinophils Relative: 2.6 % (ref 0.0–5.0)
HCT: 45.1 % (ref 36.0–46.0)
Hemoglobin: 15 g/dL (ref 12.0–15.0)
Lymphs Abs: 1.8 10*3/uL (ref 0.7–4.0)
MCV: 89 fl (ref 78.0–100.0)
Monocytes Absolute: 0.3 10*3/uL (ref 0.1–1.0)
Monocytes Relative: 6.8 % (ref 3.0–12.0)
Neutro Abs: 2.7 10*3/uL (ref 1.4–7.7)
Platelets: 192 10*3/uL (ref 150.0–400.0)
WBC: 5 10*3/uL (ref 4.5–10.5)

## 2011-12-21 LAB — POCT URINALYSIS DIPSTICK
Bilirubin, UA: NEGATIVE
Blood, UA: NEGATIVE
Leukocytes, UA: NEGATIVE
Nitrite, UA: NEGATIVE
Protein, UA: NEGATIVE
Urobilinogen, UA: 0.2
pH, UA: 5.5

## 2011-12-21 LAB — HEPATIC FUNCTION PANEL
ALT: 17 U/L (ref 0–35)
AST: 20 U/L (ref 0–37)
Albumin: 3.6 g/dL (ref 3.5–5.2)
Total Bilirubin: 0.7 mg/dL (ref 0.3–1.2)
Total Protein: 6.8 g/dL (ref 6.0–8.3)

## 2011-12-21 LAB — LIPID PANEL: Total CHOL/HDL Ratio: 4

## 2011-12-21 LAB — TSH: TSH: 1.54 u[IU]/mL (ref 0.35–5.50)

## 2011-12-21 NOTE — Addendum Note (Signed)
Addended by: Bonnye Fava on: 12/21/2011 10:49 AM   Modules accepted: Orders

## 2011-12-22 ENCOUNTER — Other Ambulatory Visit: Payer: Self-pay | Admitting: Family Medicine

## 2011-12-22 LAB — VITAMIN D 25 HYDROXY (VIT D DEFICIENCY, FRACTURES): Vit D, 25-Hydroxy: 18 ng/mL — ABNORMAL LOW (ref 30–89)

## 2011-12-22 NOTE — Progress Notes (Signed)
Quick Note:  I left voice message with results and pt does have a CPE on 12/30/11. ______

## 2011-12-30 ENCOUNTER — Ambulatory Visit (INDEPENDENT_AMBULATORY_CARE_PROVIDER_SITE_OTHER): Payer: BC Managed Care – PPO | Admitting: Family Medicine

## 2011-12-30 ENCOUNTER — Encounter: Payer: Self-pay | Admitting: Family Medicine

## 2011-12-30 VITALS — BP 120/78 | HR 84 | Temp 98.2°F | Ht 65.0 in | Wt 227.0 lb

## 2011-12-30 DIAGNOSIS — R42 Dizziness and giddiness: Secondary | ICD-10-CM

## 2011-12-30 DIAGNOSIS — Z0000 Encounter for general adult medical examination without abnormal findings: Secondary | ICD-10-CM

## 2011-12-30 MED ORDER — ERGOCALCIFEROL 1.25 MG (50000 UT) PO CAPS: 50000.0000 [IU] | ORAL_CAPSULE | ORAL | Status: DC

## 2011-12-30 MED ORDER — ALPRAZOLAM 1 MG PO TABS: 1.0000 mg | ORAL_TABLET | Freq: Every evening | ORAL | Status: DC | PRN

## 2011-12-30 MED ORDER — CYCLOBENZAPRINE HCL 10 MG PO TABS: 10.0000 mg | ORAL_TABLET | Freq: Three times a day (TID) | ORAL | Status: DC | PRN

## 2011-12-30 MED ORDER — LOSARTAN POTASSIUM-HCTZ 50-12.5 MG PO TABS: 1.0000 | ORAL_TABLET | Freq: Every day | ORAL | Status: DC

## 2011-12-30 NOTE — Progress Notes (Signed)
  Subjective:    Patient ID: Katie Palmer, female    DOB: Jun 02, 1950, 61 y.o.   MRN: [id number]  HPI 61 yr old female for a cpx. She wants to discuss several things. She had been on Cymbalta for about 2 years but this was not really helping her fibromyalgia symptoms. She was getting more and more tired all the time, she had trouble concentrating, and she was having vivid unpleasant dreams. She thought these were side effects of the Cymbalta so she weaned herself off this over a 4 weeks period. Now she feels much better though she is still fatigued. She has started to diet and she has lost about 15 pounds. She still has vertigo, especially when she lies down and she props herself up with several pillows in bed. Her recent vitamin D level was extremely low again.   Review of Systems  Constitutional: Positive for fatigue. Negative for fever, chills, diaphoresis, activity change, appetite change and unexpected weight change.  HENT: Negative.   Eyes: Negative.   Respiratory: Negative.   Cardiovascular: Negative.   Gastrointestinal: Negative.   Genitourinary: Negative for dysuria, urgency, frequency, hematuria, flank pain, decreased urine volume, enuresis, difficulty urinating, pelvic pain and dyspareunia.  Musculoskeletal: Negative.   Skin: Negative.   Neurological: Positive for dizziness. Negative for tremors, seizures, syncope, facial asymmetry, speech difficulty, weakness, light-headedness, numbness and headaches.  Hematological: Negative.   Psychiatric/Behavioral: Negative.        Objective:   Physical Exam  Constitutional: She is oriented to person, place, and time. She appears well-developed and well-nourished. No distress.  HENT:  Head: Normocephalic and atraumatic.  Right Ear: External ear normal.  Left Ear: External ear normal.  Nose: Nose normal.  Mouth/Throat: Oropharynx is clear and moist. No oropharyngeal exudate.  Eyes: Conjunctivae normal and EOM are normal. Pupils are equal,  round, and reactive to light. No scleral icterus.  Neck: Normal range of motion. Neck supple. No JVD present. No thyromegaly present.  Cardiovascular: Normal rate, regular rhythm, normal heart sounds and intact distal pulses.  Exam reveals no gallop and no friction rub.   No murmur heard.      EKG shows stable LAFB   Pulmonary/Chest: Effort normal and breath sounds normal. No respiratory distress. She has no wheezes. She has no rales. She exhibits no tenderness.  Abdominal: Soft. Bowel sounds are normal. She exhibits no distension and no mass. There is no tenderness. There is no rebound and no guarding.  Musculoskeletal: Normal range of motion. She exhibits no edema and no tenderness.  Lymphadenopathy:    She has no cervical adenopathy.  Neurological: She is alert and oriented to person, place, and time. She has normal reflexes. No cranial nerve deficit. She exhibits normal muscle tone. Coordination normal.  Skin: Skin is warm and dry. No rash noted. No erythema.  Psychiatric: She has a normal mood and affect. Her behavior is normal. Judgment and thought content normal.          Assessment & Plan:  Well exam. I urged her to continue with diet and exercise, which will her feel better in multiple ways. Stay off Cymbalta. We will supplement the vitamin D again and recheck a level in 90 days. Try vestibular rehab for the vertigo.

## 2012-01-05 ENCOUNTER — Ambulatory Visit: Payer: BC Managed Care – PPO | Admitting: Physical Therapy

## 2012-01-08 ENCOUNTER — Ambulatory Visit
Admission: RE | Admit: 2012-01-08 | Discharge: 2012-01-08 | Disposition: A | Discharge status: Home / Self Care | Payer: BC Managed Care – PPO | Source: Ambulatory Visit | Attending: Obstetrics and Gynecology | Admitting: Obstetrics and Gynecology

## 2012-01-08 DIAGNOSIS — Z1231 Encounter for screening mammogram for malignant neoplasm of breast: Secondary | ICD-10-CM

## 2012-02-17 ENCOUNTER — Telehealth: Payer: Self-pay | Admitting: Family Medicine

## 2012-02-17 NOTE — Telephone Encounter (Signed)
Patient Information:  Caller Name: Hagar  Phone: [phone]  Patient: Katie Palmer, Katie Palmer  Gender: Female  DOB: 1950/02/13  Age: 62 Years  PCP: Gershon Crane [facility])  Office Follow Up:  Does the office need to follow up with this patient?: Yes  Instructions For The Office: OFFICE PLEASE SEE IF DR Clent Ridges WILL CALL IN MEDICATIONS FOR PATIENT  RN Note:  RN triaged patient using [facility] Guideline Dizziness or Vertigo Protocol.  See Provider within 24 hours Disposition for 'Previously evaluated and worsening symptoms'.  Pt did not want to come into office; pt is requesting prescription for motion sickness and nausea  Symptoms  Reason For Call & Symptoms: pt reports she has had vertigo for years; pt states Dr Clent Ridges is aware and is working on getting you seen by a specialist.  Pt reports nausea and room spinning  Reviewed Health History In EMR: Yes  Reviewed Medications In EMR: Yes  Reviewed Allergies In EMR: Yes  Reviewed Surgeries / Procedures: Yes  Date of Onset of Symptoms: Unknown  Treatments Tried: Dramamine  Treatments Tried Worked: No  Guideline(s) Used:  No Protocol Available - Sick Adult  Disposition Per Guideline:   See Today in Office  Reason For Disposition Reached:   Nursing judgment  Advice Given:  N/A  Patient Refused Recommendation:  Patient Requests Prescription  Pt is requesting RX for motion sickness and nausea.  Pt uses [facility] [phone]  (last office visit was 12/30/11)

## 2012-02-18 ENCOUNTER — Telehealth: Payer: Self-pay | Admitting: Family Medicine

## 2012-02-18 NOTE — Telephone Encounter (Signed)
Patient Information:  Caller Name: Lyndon  Phone: [phone]  Patient: Katie Palmer  Gender: Female  DOB: [birth year]  Age: 62 Years  PCP: Gershon Crane [facility])  Office Follow Up:  Does the office need to follow up with this patient?: Yes  Instructions For The Office: Patient request RX for vertigo. LOV 12/2011  RN Note:  Reviewed EPIC unable to locate Vertigo medication.  Patient states she was just in office in December 2013 for full physical and discussion for Vestibular Rehab discussed. Patient insurance will not pick up till April , she will have to wait. Advised I will forward message to the office regarding Rx request. Zofran 4mg  0DT every 8 hours prn nausea/vomiting #6 with NR per Dr. Clent Ridges called to [facility] [phone] . Spoke with pharmacist Rob. ready in 1 hours. Advised that she must follow up with office tomorrow for evaluation or discussion of medicaiton . Home care instrucitons and call back guidelines provided.  Symptoms  Reason For Call & Symptoms: Patient has Vertigo (for 20 years). She states it started acting up.  She is nauseated and having dizziness. Onset three days.  LOV 12/30/11.  She is requesting medication for discomfort. Decreased  appetite, +drinking. Nausea and discomfort is the worst. Called office yesterday but Dr. Clent Ridges is off today.   Patient requesting something for nausea until Dr. Clent Ridges is in the office for medication. She declines to make appt because she was just in 12/13 and insurance will not cover.  +spinning.   Reviewed Health History In EMR: Yes  Reviewed Medications In EMR: Yes  Reviewed Allergies In EMR: Yes  Reviewed Surgeries / Procedures: No  Date of Onset of Symptoms: 02/15/2012  Treatments Tried: Dramamine  Treatments Tried Worked: No  Guideline(s) Used:  Dizziness  Disposition Per Guideline:   See Within 2 Weeks in Office  Reason For Disposition Reached:   Dizziness not present now, but is a chronic symptom (recurrent or  ongoing AND lasting > 4 weeks)  Advice Given:  Drink Fluids:  Drink several glasses of fruit juice, other clear fluids, or water. This will improve hydration and blood glucose. If you have a fever or have had heat exposure, make sure the fluids are cold.  Rest for 1-2 Hours:  Lie down with feet elevated for 1 hour. This will improve blood flow and increase blood flow to the brain.  Stand Up Slowly:  In the mornings, sit up for a few minutes before you stand up. That will help your blood flow make the adjustment.  If you have to stand up for long periods of time, contract and relax your leg muscles to help pump the blood back to the heart.  Sit down or lie down if you feel dizzy.  Call Back If:  Still feel dizzy after 2 hours of rest and fluids  Passes out (faints)  You become worse.  RN Overrode Recommendation:  Patient Requests Prescription  Patient request Vertigo medication. Zofran called in for assistance wtih nausea

## 2012-02-19 MED ORDER — MECLIZINE HCL 25 MG PO TABS: 25.0000 mg | ORAL_TABLET | ORAL | Status: DC | PRN

## 2012-02-19 MED ORDER — ONDANSETRON 4 MG PO TBDP: 4.0000 mg | ORAL_TABLET | Freq: Three times a day (TID) | ORAL | Status: DC | PRN

## 2012-02-19 NOTE — Telephone Encounter (Signed)
Call in Zofran as above with 5 rf

## 2012-02-19 NOTE — Telephone Encounter (Signed)
Call in Meclizine 25 mg q 4 hours prn dizziness, #60 with 5 rf 

## 2012-02-19 NOTE — Telephone Encounter (Signed)
I sent script e-scribe and left voice message for pt 

## 2012-02-25 ENCOUNTER — Other Ambulatory Visit: Payer: Self-pay | Admitting: Family Medicine

## 2012-03-19 ENCOUNTER — Other Ambulatory Visit: Payer: Self-pay | Admitting: Family Medicine

## 2012-03-21 NOTE — Telephone Encounter (Signed)
Call in #120 with 5 rf 

## 2012-05-30 ENCOUNTER — Other Ambulatory Visit: Payer: Self-pay | Admitting: Family Medicine

## 2012-06-16 ENCOUNTER — Other Ambulatory Visit: Payer: Self-pay | Admitting: Family Medicine

## 2012-06-17 ENCOUNTER — Telehealth: Payer: Self-pay | Admitting: Family Medicine

## 2012-06-17 MED ORDER — MELOXICAM 15 MG PO TABS: 15.0000 mg | ORAL_TABLET | Freq: Every day | ORAL | Status: DC

## 2012-06-17 NOTE — Telephone Encounter (Signed)
Refill request for Meloxicam and I did send e-scribe.

## 2012-06-17 NOTE — Telephone Encounter (Signed)
Can we refill this? 

## 2012-06-29 ENCOUNTER — Other Ambulatory Visit: Payer: Self-pay | Admitting: Family Medicine

## 2012-06-29 NOTE — Telephone Encounter (Signed)
Call in #90 with one rf 

## 2012-06-29 NOTE — Telephone Encounter (Signed)
Refill request for Alprazolam 1 mg take 1 po qhs prn. 

## 2012-06-30 MED ORDER — ALPRAZOLAM 1 MG PO TABS: 1.0000 mg | ORAL_TABLET | Freq: Every evening | ORAL | Status: DC | PRN

## 2012-06-30 NOTE — Telephone Encounter (Signed)
I called in script 

## 2012-07-02 ENCOUNTER — Other Ambulatory Visit: Payer: Self-pay | Admitting: Family Medicine

## 2012-07-04 NOTE — Telephone Encounter (Signed)
Call in #120 with 5 rf 

## 2012-10-10 ENCOUNTER — Telehealth: Payer: Self-pay | Admitting: Family Medicine

## 2012-10-10 NOTE — Telephone Encounter (Signed)
PT is calling to request that a 3 month supply of ALPRAZolam (XANAX) 1 MG tablet, be sent to [facility] on [address]. She states that she usually only recieves a one month supply, but a 3 month would be better. Please assist.

## 2012-10-10 NOTE — Telephone Encounter (Signed)
No, we gave her a 6 month supply on 06-30-12

## 2012-10-10 NOTE — Telephone Encounter (Signed)
Also requesting Tramadol 50 mg.

## 2012-10-11 MED ORDER — TRAMADOL HCL 50 MG PO TABS: 50.0000 mg | ORAL_TABLET | ORAL | Status: DC | PRN

## 2012-10-11 NOTE — Telephone Encounter (Signed)
Increase the Tramadol to #180 with 5 rf

## 2012-10-11 NOTE — Telephone Encounter (Signed)
I called in script and I spoke with pt. She does have a 3 month supply of Xanax.

## 2012-10-11 NOTE — Telephone Encounter (Signed)
I spoke with pt and she does take the Tramadol every 4 hours as needed. Also she is changing pharmacies now to [facility].

## 2012-11-28 ENCOUNTER — Telehealth: Payer: Self-pay | Admitting: Family Medicine

## 2012-11-28 NOTE — Telephone Encounter (Signed)
Patient Information:  Caller Name: Karia  Phone: [phone]  Patient: Katie Palmer, Katie Palmer  Gender: Female  DOB: Aug 31, 1950  Age: 62 Years  PCP: Gershon Crane [facility])  Office Follow Up:  Does the office need to follow up with this patient?: Yes  Instructions For The Office: See Today per guideline.  Dr. Claris Che appointments are full.  She has appointment on 11/29/12 @ 11:00.  Patient asks if she needs to come for fasting blood work before her appointment. Please follow up with patient. Thank you.   Symptoms  Reason For Call & Symptoms: Patient calling about FSBG 219 before eating.  Blood sugars have been going up and she has been losing weight.  Has appointment with Dr. Clent Ridges on 11/29/12.  See Today in Office per Diabetes - High Blood Sugar due to New -onset diabetes suspected (caller agreed she has had frequent urination, unintentional weight loss, extreme thirst and generally feeling very poorly.)  Reviewed Health History In EMR: Yes  Reviewed Medications In EMR: Yes  Reviewed Allergies In EMR: Yes  Reviewed Surgeries / Procedures: Yes  Date of Onset of Symptoms: 08/26/2012  Treatments Tried: Losing weight from 240 to 190; has not tried to lose weight last of October.  Treatments Tried Worked: No  Guideline(s) Used:  Diabetes - High Blood Sugar  Disposition Per Guideline:   See Today in Office  Reason For Disposition Reached:   New-onset diabetes suspected (e.g., frequent urination, weak, weight loss)  Advice Given:  General  Symptoms of mild hyperglycemia: frequent urination, increased thirst, fatigue, blurred vision.  Treatment - Liquids  Drink at least one glass (8 oz or 240 ml) of water per hour for the next 4 hours. (Reason: adequate hydration will reduce hyperglycemia).  Measure and Record Your Blood Glucose  Every day you should measure your blood glucose before breakfast and before going to bed.  Record the results and show them to your doctor at your next office  visit.  Patient Will Follow Care Advice:  YES

## 2012-11-28 NOTE — Telephone Encounter (Signed)
I left voice message for pt to come in fasting.

## 2012-11-29 ENCOUNTER — Ambulatory Visit (INDEPENDENT_AMBULATORY_CARE_PROVIDER_SITE_OTHER): Payer: BC Managed Care – PPO | Admitting: Family Medicine

## 2012-11-29 ENCOUNTER — Encounter: Payer: Self-pay | Admitting: Family Medicine

## 2012-11-29 VITALS — BP 128/86 | HR 112 | Temp 98.2°F | Wt 196.0 lb

## 2012-11-29 DIAGNOSIS — E119 Type 2 diabetes mellitus without complications: Secondary | ICD-10-CM

## 2012-11-29 DIAGNOSIS — E559 Vitamin D deficiency, unspecified: Secondary | ICD-10-CM

## 2012-11-29 LAB — POCT URINALYSIS DIPSTICK
Blood, UA: NEGATIVE
Nitrite, UA: NEGATIVE
Protein, UA: NEGATIVE
Spec Grav, UA: 1.02
Urobilinogen, UA: 1
pH, UA: 5.5

## 2012-11-29 LAB — HEPATIC FUNCTION PANEL
ALT: 19 U/L (ref 0–35)
Albumin: 4.3 g/dL (ref 3.5–5.2)
Alkaline Phosphatase: 100 U/L (ref 39–117)
Bilirubin, Direct: 0 mg/dL (ref 0.0–0.3)
Total Protein: 7.9 g/dL (ref 6.0–8.3)

## 2012-11-29 LAB — BASIC METABOLIC PANEL
CO2: 27 mEq/L (ref 19–32)
Calcium: 9.7 mg/dL (ref 8.4–10.5)
Chloride: 97 mEq/L (ref 96–112)
Glucose, Bld: 265 mg/dL — ABNORMAL HIGH (ref 70–99)
Sodium: 134 mEq/L — ABNORMAL LOW (ref 135–145)

## 2012-11-29 LAB — CBC WITH DIFFERENTIAL/PLATELET
Basophils Relative: 0.4 % (ref 0.0–3.0)
Eosinophils Absolute: 0.1 10*3/uL (ref 0.0–0.7)
HCT: 48.2 % — ABNORMAL HIGH (ref 36.0–46.0)
Hemoglobin: 16.8 g/dL — ABNORMAL HIGH (ref 12.0–15.0)
Lymphocytes Relative: 24.1 % (ref 12.0–46.0)
Lymphs Abs: 2.2 10*3/uL (ref 0.7–4.0)
MCHC: 34.7 g/dL (ref 30.0–36.0)
MCV: 87.7 fl (ref 78.0–100.0)
Monocytes Relative: 5.4 % (ref 3.0–12.0)
Neutro Abs: 6.2 10*3/uL (ref 1.4–7.7)
RBC: 5.5 Mil/uL — ABNORMAL HIGH (ref 3.87–5.11)
WBC: 9 10*3/uL (ref 4.5–10.5)

## 2012-11-29 LAB — LIPID PANEL
HDL: 74 mg/dL (ref >39.00)
Total CHOL/HDL Ratio: 3
Triglycerides: 109 mg/dL (ref 0.0–149.0)

## 2012-11-29 MED ORDER — METFORMIN HCL 500 MG PO TABS: 500.0000 mg | ORAL_TABLET | Freq: Two times a day (BID) | ORAL | Status: DC

## 2012-11-29 MED ORDER — MECLIZINE HCL 25 MG PO TABS: 25.0000 mg | ORAL_TABLET | ORAL | Status: DC | PRN

## 2012-11-29 MED ORDER — LOSARTAN POTASSIUM-HCTZ 50-12.5 MG PO TABS: 1.0000 | ORAL_TABLET | Freq: Every day | ORAL | Status: DC

## 2012-11-29 MED ORDER — CYCLOBENZAPRINE HCL 10 MG PO TABS: 10.0000 mg | ORAL_TABLET | Freq: Three times a day (TID) | ORAL | Status: DC | PRN

## 2012-11-29 MED ORDER — MELOXICAM 15 MG PO TABS: 15.0000 mg | ORAL_TABLET | Freq: Every day | ORAL | Status: DC

## 2012-11-29 MED ORDER — TRAMADOL HCL 50 MG PO TABS: 100.0000 mg | ORAL_TABLET | Freq: Four times a day (QID) | ORAL | Status: DC | PRN

## 2012-11-29 MED ORDER — SUMATRIPTAN SUCCINATE 100 MG PO TABS: ORAL_TABLET | ORAL | Status: DC

## 2012-11-29 MED ORDER — ALPRAZOLAM 1 MG PO TABS: 1.0000 mg | ORAL_TABLET | Freq: Every evening | ORAL | Status: DC | PRN

## 2012-11-29 NOTE — Progress Notes (Signed)
  Subjective:    Patient ID: Katie Palmer, female    DOB: 22-Oct-1950, 62 y.o.   MRN: [id number]  HPI Here for new onset diabetes. She had been feeling very tired and was urinating frequently, so she started checking her glucoses a month ago. She is getting random values from 219 to 258 during the day. She has changed her diet and is exercising, and she has lost 31 lbs since she was here last December. u   Review of Systems  Constitutional: Positive for fatigue.  Respiratory: Negative.   Cardiovascular: Negative.        Objective:   Physical Exam  Constitutional: She appears well-developed and well-nourished.  Neck: No thyromegaly present.  Cardiovascular: Normal rate, regular rhythm, normal heart sounds and intact distal pulses.   Pulmonary/Chest: Effort normal and breath sounds normal.  Lymphadenopathy:    She has no cervical adenopathy.          Assessment & Plan:  I agree she does have type 2 diabetes. Get fasting labs today. Start on Metformin 500 mg bid.

## 2012-12-02 NOTE — Progress Notes (Signed)
Quick Note:  I spoke with pt and faxed results to [phone] per pt request. ______

## 2012-12-02 NOTE — Progress Notes (Signed)
Quick Note:  I spoke with pt and faxed results to [phone] per pt request. ______ 

## 2013-01-11 ENCOUNTER — Telehealth: Payer: Self-pay | Admitting: Family Medicine

## 2013-01-11 NOTE — Telephone Encounter (Signed)
[facility] Triage Call Report Triage Record Num: [id number] Operator: Maryfrances Bunnell Patient Name: Katie Palmer Call Date & Time: 01/10/2013 5:21:02PM Patient Phone: [phone] PCP: Tera Mater. Clent Ridges Patient Gender: Female PCP Fax : [phone] Patient DOB: Sep 19, 1950 [facility] Name: [facility] Reason for Call: Caller: Michalene/Patient; PCP: [facility]); CB#: [phone]; Call regarding Question; Metformin 500mg  bid since Nov. 2014 and bs have been 230's fasting; Checks bs am and pm is still running 230's; Feels terrible "like I have been hit by a train." Feels like she did when she first came in, no improvement; Continues to be losing wt, down to 183lbs; Calling to see if a dose increase needs to be made but missed office hours. Denies any confusion, increased urination, dizziness; Has only checked bs x 1 today so far and it was 255; Per Diabetes: Control Problems Protocol "Symptoms of early hyperglycemia (unusually thirsty, frequent urination, feeling tired or weak) Home care advice given. Reviewed emergent symptoms. Appt offered per disposition to see provider within 24 hours but patient states she will have no insurance coverage until Feb. and would prefer to f/u by phone in am. Protocol(s) Used: Diabetes: Control Problems Recommended Outcome per Protocol: See Provider within 24 hours Reason for Outcome: Symptoms of early hyperglycemia (unusually thirsty, frequent urination, feeling tired or weak) Care Advice: Follow the usual meal plan if possible. Drink extra non-caloric fluids. If unable to eat at all, drink regular soft drinks and juices so that 50 grams of carbohydrates are taken in every 3 to 4 hours. ~ Go to ED immediately if blood sugar is 300 mg/dl or more AND symptoms are present including: large urine ketones, rapid, deep breathing, fruity breath, nausea, vomiting or abdominal pain; confused thinking, dry, flushed skin; extreme thirst,  extreme fatigue or weakness; or frequent urination. ~ ~ SYMPTOM / CONDITION MANAGEMENT ~ Check blood sugar and ketones before calling provider Call provider within 24 hours if blood sugar is 250 mg/dl two times in a row, or if most blood sugars are 250 mg/dl for more than 3 days. Call provider immediately if urine ketones are large (3+ to 4+). ~ Blood Sugar and Ketone Testing: - Check blood sugar as recommended, and more often whenever it has been high or low, any change to the treatment plan, increased stress, illness, infection, or a change to the regular schedule. - Test for urine ketones anytime the blood sugar is above 250 mg/dl, when there is stress, acute illness, nausea, vomiting, or abdominal pain. - Follow action plan for illness. - Record blood sugar and ketones in meter log or separate logbook, and take to all provider visits. - Drink extra water and other non-sugar fluids to prevent dehydration when the blood sugar is high and urine ketones are present. ~ High Blood Sugar Treatment: - Follow action plan. - Adjust medications if instructed to do so in action plan or by provider. - Test blood sugar and ketones more often. - Record blood sugar and ketones in meter log or separate logbook, and take to all provider visits. - Drink extra water and other non-sugar fluids to prevent dehydration when the blood sugar is high and urine ketones

## 2013-01-11 NOTE — Telephone Encounter (Signed)
Pt called regarding her diabetes, pt states it is not under control. Readings range from 236 to 364 beginning on November 6th. Does pt need to Metformin? Pt feels awful. Please advise.

## 2013-01-11 NOTE — Telephone Encounter (Signed)
Advised pt if she feels worse before advise can be give to report to the ED.

## 2013-01-11 NOTE — Telephone Encounter (Signed)
Called pt and pt states she is taking the metformin 500 mg bid.  Pt states she is thirsty and the water and pt aches all over, pt cannot work.  Pt was diagnosed in November and pt cannot get control of it.  Pt would like to know if she needs additional medication or if she needs to increase the metformin.  Pt has an upcoming CPX in Feb and she does not have insurance until that time.

## 2013-01-12 MED ORDER — GLIPIZIDE 10 MG PO TABS: 10.0000 mg | ORAL_TABLET | Freq: Two times a day (BID) | ORAL | Status: DC

## 2013-01-12 MED ORDER — METFORMIN HCL 1000 MG PO TABS: 1000.0000 mg | ORAL_TABLET | Freq: Two times a day (BID) | ORAL | Status: DC

## 2013-01-12 NOTE — Telephone Encounter (Signed)
See my other note

## 2013-01-12 NOTE — Telephone Encounter (Signed)
Tell her to increase the Metformin to 1000 mg bid, and call in a one year supply. Also call in Glipizide 10 mg bid for one year supply. See [place] in 2 weeks to follow up

## 2013-01-12 NOTE — Telephone Encounter (Signed)
Called and spoke with pt and pt is aware of Dr. Claris Che recommendations.  Pt states she cannot come back in until Feb 2015 because her last appt cost her $700 dollars and she cannot afford that at this time.  Pt states she will keep a detailed log of her blood sugar readings and call to report these in 2 weeks.  Advised pt to call if bs are too low or too high.  Pt verbalized understanding.  Both rx sent to [facility] for 1 year supply.

## 2013-01-12 NOTE — Telephone Encounter (Signed)
See previous note

## 2013-01-18 ENCOUNTER — Telehealth: Payer: Self-pay | Admitting: Family Medicine

## 2013-01-18 NOTE — Telephone Encounter (Signed)
[facility] Triage Call Report Triage Record Num: [id number] Operator: Geanie Berlin Patient Name: Katie Palmer Call Date & Time: 01/17/2013 5:03:41PM Patient Phone: [phone] PCP: Tera Mater. Clent Ridges Patient Gender: Female PCP Fax : [phone] Patient DOB: 12-20-1950 [facility] Name[facility] Reason for Call: Caller: Katie Palmer/Patient; PCP: [facility]); CB#: [phone]; Call regarding Elevated blood sugar; Onset: Afebrile. Random blood sugar 290 at 1640, two hrs after eating salad for lunch; Blood sugar 249 at 1230 and Blood sugars in 230's 01/16/13. MD increased Metformn to 1000 mg BID and added Glypizide 10 mg BID 01/12/13. Vomited within 1 hour of eating at 1330. Continues to have nausea. No diarrhea. Feels like she has "been hit by a truck"with generalized body aches. Drinking a lot of water. Initially did not take full dose of Metformin or Glipizide; trying to ease into it. Advised to see [facility] within 4 hours for new or increasing symptoms or glucose out of control as defined by provider or action plan and taking medications/following therapy as prescribed per Diabetes Control Problems. Protocol(s) Used: Diabetes: Control Problems Recommended Outcome per Protocol: See Provider within 4 hours Reason for Outcome: New or increasing symptoms or glucose out of control as defined by provider or action plan AND taking medications/following therapy as prescribed Care Advice: Follow the usual meal plan if possible. Drink extra non-caloric fluids. If unable to eat at all, drink regular soft drinks and juices so that 50 grams of carbohydrates are taken in every 3 to 4 hours. ~ ~ Call provider if symptoms worsen before scheduled appointment. ~ SYMPTOM / CONDITION MANAGEMENT Medication Advice: - Tell provider all prescription, nonprescription or alternative medications that you take. - Know possible side effects of medication and what to do If they  occur. - Discontinue all nonprescription and alternative medications, especially stimulants, until evaluated by provider. ~ High Blood Sugar Treatment: - Follow action plan. - Adjust medications if instructed to do so in action plan or by provider. - Test blood sugar and ketones more often. - Record blood sugar and ketones in meter log or separate logbook, and take to all provider visits. - Drink extra water and other non-sugar fluids to prevent dehydration when the blood sugar is high and urine

## 2013-01-18 NOTE — Telephone Encounter (Signed)
FYI

## 2013-02-01 ENCOUNTER — Other Ambulatory Visit: Payer: Self-pay | Admitting: Family Medicine

## 2013-02-01 NOTE — Telephone Encounter (Signed)
Pt also needs new rx glucose test strips ( true test )

## 2013-02-02 MED ORDER — GLUCOSE BLOOD VI STRP: ORAL_STRIP | Status: DC

## 2013-02-24 ENCOUNTER — Other Ambulatory Visit: Payer: Self-pay

## 2013-02-24 ENCOUNTER — Ambulatory Visit (INDEPENDENT_AMBULATORY_CARE_PROVIDER_SITE_OTHER): Payer: BC Managed Care – PPO | Admitting: Internal Medicine

## 2013-02-24 ENCOUNTER — Encounter: Payer: Self-pay | Admitting: Internal Medicine

## 2013-02-24 VITALS — BP 114/68 | HR 86 | Temp 98.1°F | Resp 12 | Ht 64.5 in | Wt 176.0 lb

## 2013-02-24 DIAGNOSIS — R5381 Other malaise: Secondary | ICD-10-CM

## 2013-02-24 DIAGNOSIS — R5383 Other fatigue: Secondary | ICD-10-CM

## 2013-02-24 DIAGNOSIS — E119 Type 2 diabetes mellitus without complications: Secondary | ICD-10-CM

## 2013-02-24 LAB — GLUCOSE, POCT (MANUAL RESULT ENTRY): POC GLUCOSE: 334 mg/dL — AB (ref 70–99)

## 2013-02-24 LAB — HEMOGLOBIN A1C: Hgb A1c MFr Bld: 10.4 % — ABNORMAL HIGH (ref 4.6–6.5)

## 2013-02-24 MED ORDER — GLUCOSE BLOOD VI STRP: ORAL_STRIP | Status: DC

## 2013-02-24 MED ORDER — INSULIN PEN NEEDLE 32G X 5 MM MISC: Status: DC

## 2013-02-24 MED ORDER — INSULIN GLARGINE 100 UNIT/ML SOLOSTAR PEN: 20.0000 [IU] | PEN_INJECTOR | Freq: Every day | SUBCUTANEOUS | Status: DC

## 2013-02-24 MED ORDER — ACCU-CHEK SOFTCLIX LANCETS MISC: Status: DC

## 2013-02-24 NOTE — Patient Instructions (Addendum)
Please come back for a follow-up appointment in 1 month - bring your sugar log. Please continue Metformin 1000 mg 2x a day. Please start Lantus 15 units at bedtime. If sugars not improved <180 in am in the next 5 days, please increase to 20 units.  Please let me know in 2 weeks if sugars are still >200 or if they are low <80.  PATIENT INSTRUCTIONS FOR TYPE 2 DIABETES:  **Please join MyChart!** - see attached instructions about how to join   DIET AND EXERCISE Diet and exercise is an important part of diabetic treatment.  We recommended aerobic exercise in the form of brisk walking (working between 40-60% of maximal aerobic capacity, similar to brisk walking) for 150 minutes per week (such as 30 minutes five days per week) along with 3 times per week performing 'resistance' training (using various gauge rubber tubes with handles) 5-10 exercises involving the major muscle groups (upper body, lower body and core) performing 10-15 repetitions (or near fatigue) each exercise. Start at half the above goal but build slowly to reach the above goals. If limited by weight, joint pain, or disability, we recommend daily walking in a swimming pool with water up to waist to reduce pressure from joints while allow for adequate exercise.    BLOOD GLUCOSES Monitoring your blood glucoses is important for continued management of your diabetes. Please check your blood glucoses 2-4 times a day: fasting, before meals and at bedtime (you can rotate these measurements - e.g. one day check before the 3 meals, the next day check before 2 of the meals and before bedtime, etc.   HYPOGLYCEMIA (low blood sugar) Hypoglycemia is usually a reaction to not eating, exercising, or taking too much insulin/ other diabetes drugs.  Symptoms include tremors, sweating, hunger, confusion, headache, etc. Treat IMMEDIATELY with 15 grams of Carbs:   4 glucose tablets    cup regular juice/soda   2 tablespoons raisins   4 teaspoons sugar    1 tablespoon honey Recheck blood glucose in 15 mins and repeat above if still symptomatic/blood glucose <100. Please contact our office at [phone] if you have questions about how to next handle your insulin.  RECOMMENDATIONS TO REDUCE YOUR RISK OF DIABETIC COMPLICATIONS: * Take your prescribed MEDICATION(S). * Follow a DIABETIC diet: Complex carbs, fiber rich foods, heart healthy fish twice weekly, (monounsaturated and polyunsaturated) fats * AVOID saturated/trans fats, high fat foods, >2,300 mg salt per day. * EXERCISE at least 5 times a week for 30 minutes or preferably daily.  * DO NOT SMOKE OR DRINK more than 1 drink a day. * Check your FEET every day. Do not wear tightfitting shoes. Contact us if you develop an ulcer * See your EYE doctor once a year or more if needed * Get a FLU shot once a year * Get a PNEUMONIA vaccine once before and once after age 2 years  GOALS:  * Your Hemoglobin A1c of <7%  * fasting sugars need to be <130 * after meals sugars need to be <180 (2h after you start eating) * Your Systolic BP should be 098 or lower  * Your Diastolic BP should be 80 or lower  * Your HDL (Good Cholesterol) should be 40 or higher  * Your LDL (Bad Cholesterol) should be 100 or lower  * Your Triglycerides should be 150 or lower  * Your Urine microalbumin (kidney function) should be <30 * Your Body Mass Index should be 25 or lower   We will  be glad to help you achieve these goals. Our telephone number is: [phone].  Please consider the following ways to cut down carbs and fat and increase fiber and micronutrients in your diet:  - substitute whole grain for white bread or pasta - substitute brown rice for white rice - substitute 90-calorie flat bread pieces for slices of bread when possible - substitute sweet potatoes or yams for white potatoes - substitute humus for margarine - substitute tofu for cheese when possible - substitute almond or rice milk for regular  milk (would not drink soy milk daily due to concern for soy estrogen influence on breast cancer risk) - substitute dark chocolate for other sweets when possible - substitute water - can add lemon or orange slices for taste - for diet sodas (artificial sweeteners will trick your body that you can eat sweets without getting calories and will lead you to overeating and weight gain in the long run) - do not skip breakfast or other meals (this will slow down the metabolism and will result in more weight gain over time)  - can try smoothies made from fruit and almond/rice milk in am instead of regular breakfast - can also try old-fashioned (not instant) oatmeal made with almond/rice milk in am - order the dressing on the side when eating salad at a restaurant (pour less than half of the dressing on the salad) - eat as little meat as possible - can try juicing, but should not forget that juicing will get rid of the fiber, so would alternate with eating raw veg./fruits or drinking smoothies - use as little oil as possible, even when using olive oil - can dress a salad with a mix of balsamic vinegar and lemon juice, for e.g. - use agave nectar, stevia sugar, or regular sugar rather than artificial sweateners - steam or broil/roast veggies  - snack on veggies/fruit/nuts (unsalted, preferably) when possible, rather than processed foods - reduce or eliminate aspartame in diet (it is in diet sodas, chewing gum, etc) Read the labels!  Try to read Dr. Janene Harvey book: "Program for Reversing Diabetes" for the vegan concept and other ideas for healthy eating.  Try to replace snacking on these with drinking/eating: * soy or almond milk * veggies with humus or other low calorie/low fat dip * low glycemic index fruits (higher glycemic index = higher risk to increase your sugars):          http://www.health.http://flores-mcbride.com/ * fruit/veggie smoothies           Ninja blender recipes:          https://www.moore-west.com/ * unsalted nuts Etc.

## 2013-02-24 NOTE — Progress Notes (Signed)
Patient ID: Katie Palmer, female   DOB: 1950-04-20, 63 y.o.   MRN: [id number]  HPI: Katie Palmer is a 63 y.o.-year-old female, referred by her PCP, Dr. Sarajane Palmer, for management of DM2, uncontrolled, without complications.  Patient has been diagnosed with diabetes in 11/2012 - before then, she started to feel very fatigued and to have frequent urination >> started to check sugars: 219-258. She changed her diet and started to exercise >> started to lose weight, but she feels this is not entirely intentionally: ~50 lbs in a year. Hemoglobin A1c was: Lab Results  Component Value Date   HGBA1C 10.0* 11/29/2012   Pt is on a regimen of: - Metformin 1000 mg po bid - can tolerate well the high dose She was on Glipizide 10 mg bid >> diarrhea if taking with Metformin  She has True Test - bought them online.   Pt checks her sugars 1-3 a day and they are: - am: 187-293 - 2h after b'fast: n/c - before lunch: 198-241 - 2h after lunch: 174-261 - before dinner: 164-347 - 2h after dinner: n/c - bedtime: 198-371 -midnight - nighttime: No lows. ? If has hypoglycemia awareness. Highest sugar was 300s.  Pt's meals are: - Breakfast: boiled egg; not oatmeal - Lunch: salad - watches the dressing - Dinner: vegetable soup - made from scratch - Snacks: no No bread, no soft drinks. Exercises 3-5 x a week  - no CKD, last BUN/creatinine:  Lab Results  Component Value Date   BUN 16 11/29/2012   CREATININE 0.7 11/29/2012  She is on Losartan. - last set of lipids: Lab Results  Component Value Date   CHOL 200 11/29/2012   HDL 74.00 11/29/2012   LDLCALC 104* 11/29/2012   LDLDIRECT 145.5 02/04/2009   TRIG 109.0 11/29/2012   CHOLHDL 3 11/29/2012  She is not on a statin. - no previous dilated eye exam - no numbness and tingling in her feet.  No FH of DM.   ROS: Constitutional: + weight loss, + increased appetite, + fatigue, + both subjective hyperthermia/hypothermia, + poor sleep Eyes: no blurry vision, no  xerophthalmia ENT: no sore throat, no nodules palpated in throat, no dysphagia/odynophagia, no hoarseness Cardiovascular: no CP/SOB/palpitations/leg swelling Respiratory: no cough/SOB Gastrointestinal: no N/V/D/C Musculoskeletal: + muscle aches/no joint aches Skin: no rashes, + easy bruising, + hair loss Neurological: no tremors/numbness/tingling/dizziness Psychiatric: no depression/anxiety  Past Medical History  Diagnosis Date  . Hx of colonic polyps   . Depression   . Headache(784.0)   . Hypertension   . Insomnia   . Fibromyalgia    Past Surgical History  Procedure Laterality Date  . Pulmonary stress test      Normal on 02/04/09. Sees Dr. Philis Palmer for GYN exams  . Tonsillectomy    . Colonoscopy  Feb. 2012    per GI in [place], clear, repeat in 5 yrs   . Diagnostic mammogram      01/27/2008. normal   History   Social History  . Marital Status: Divorced    Spouse Name: N/A    Number of Children: 44, 31 y/o   Occupational History  . Insurance agent   Social History Main Topics  . Smoking status: Never Smoker   . Smokeless tobacco: Never Used  . Alcohol Use: No  . Drug Use: No   Current Outpatient Prescriptions on File Prior to Visit  Medication Sig Dispense Refill  . ALPRAZolam (XANAX) 1 MG tablet Take 1 tablet (1 mg total) by mouth at bedtime  as needed for sleep.  90 tablet  1  . cyclobenzaprine (FLEXERIL) 10 MG tablet Take 1 tablet (10 mg total) by mouth 3 (three) times daily as needed.  120 tablet  3  . glucose blood (TRUETEST TEST) test strip Use as instructed  100 each  1  . losartan-hydrochlorothiazide (HYZAAR) 50-12.5 MG per tablet Take 1 tablet by mouth daily.  90 tablet  3  . meclizine (ANTIVERT) 25 MG tablet Take 1 tablet (25 mg total) by mouth every 4 (four) hours as needed.  60 tablet  5  . meloxicam (MOBIC) 15 MG tablet TAKE 1 TABLET EVERY DAY  30 tablet  2  . metFORMIN (GLUCOPHAGE) 1000 MG tablet Take 1 tablet (1,000 mg total) by mouth 2 (two) times  daily with a meal.  180 tablet  3  . ondansetron (ZOFRAN ODT) 4 MG disintegrating tablet Take 1 tablet (4 mg total) by mouth every 8 (eight) hours as needed for nausea.  6 tablet  5  . SUMAtriptan (IMITREX) 100 MG tablet TAKE 1 TABLET BY MOUTH AS NEEDED FOR MIGRAINE  30 tablet  3  . traMADol (ULTRAM) 50 MG tablet Take 2 tablets (100 mg total) by mouth every 6 (six) hours as needed.  240 tablet  3  . glipiZIDE (GLUCOTROL) 10 MG tablet Take 1 tablet (10 mg total) by mouth 2 (two) times daily before a meal.  180 tablet  3   No current facility-administered medications on file prior to visit.   Allergies  Allergen Reactions  . Cymbalta [Duloxetine Hcl]     confusion   Family History  Problem Relation Age of Onset  . Alcohol abuse    . Breast cancer      1st degree relative  . Uterine cancer    . Heart disease    . Aneurysm     PE: Ht 5' 4.5" (1.638 m)  Wt 176 lb (79.833 kg)  BMI 29.75 kg/m2 Wt Readings from Last 3 Encounters:  02/24/13 176 lb (79.833 kg)  11/29/12 196 lb (88.905 kg)  12/30/11 227 lb (102.967 kg)   Constitutional: overweight, in NAD Eyes: PERRLA, EOMI, no exophthalmos ENT: moist mucous membranes, no thyromegaly, no cervical lymphadenopathy Cardiovascular: RRR, No MRG Respiratory: CTA B Gastrointestinal: abdomen soft, NT, ND, BS+ Musculoskeletal: no deformities, strength intact in all 4 Skin: moist, warm, no rashes Neurological: no tremor with outstretched hands, DTR normal in all 4  ASSESSMENT: 1. DM2, insulin-dependent, uncontrolled, without complications  2. Fatigue  PLAN:  1. Patient with recently dx-ed diabetes, on oral antidiabetic regimen, with poor control. - We discussed about options for treatment, and I suggested to:  Patient Instructions  Please come back for a follow-up appointment in 3 months - bring your sugar log. Please continue Metformin 1000 mg 2x a day. Please start Lantus 15 units at bedtime. If sugars not improved <180 in am in the  next 5 days, please increase to 20 units.  Please let me know in 2 weeks if sugars are still >200 or if they are low <80. - demonstrated use of insulin pen - discussed proper injection techniques:  Preference for the abdominal sq tissue  Rotation of sites  Change needle for each injection  Keep needle in for 10 sec after last unit of insulin in - sent Rx for insulin, pen needles to her pharmacy - given a AccuCheck Nano meter and discount card for strips - sent a Rx for strips and lancets to her pharmacy - Continue  checking sugars at different times of the day - check 2 times a day, rotating checks - given sugar log and advised how to fill it and to bring it at next appt  - discussed diet and given handout with suggestions for improvement - referred to nutrition - she is also interested in Diabetes education classes - given info about the "Diabetes [facility]" support Group here in [place] - given foot care handout and explained the principles  - given instructions for hypoglycemia management "15-15 rule"  - advised for yearly eye exams - referred to Dr Prudencio Burly - check A1c today - checked CBG in the office: 300s - Return to clinic in 1 mo with sugar log   2. Fatigue - check vit D and B12  Time spent with the pt: 60 min, of which >70% was spent in counseling about her disease, reviewing her sugar log, reviewing hypoglycemia management rules, teaching insulin injection technique, designing a treatment plan >> please see most discussed topics listed above.  Office Visit on 02/24/2013  Component Date Value Range Status  . Hemoglobin A1C 02/24/2013 10.4* 4.6 - 6.5 % Final   Glycemic Control Guidelines for People with Diabetes:Non Diabetic:  <6%Goal of Therapy: <7%Additional Action Suggested:  >8%   . Vitamin B-12 02/24/2013 299  211 - 911 pg/mL Final  . Vit D, 25-Hydroxy 02/24/2013 33  30 - 89 ng/mL Final   Comment: This assay accurately quantifies Vitamin D, which is the sum of the                           25-Hydroxy forms of Vitamin D2 and D3.  Studies have shown that the                          optimum concentration of 25-Hydroxy Vitamin D is 30 ng/mL or higher.                           Concentrations of Vitamin D between 20 and 29 ng/mL are considered to                          be insufficient and concentrations less than 20 ng/mL are considered                          to be deficient for Vitamin D.  . POC Glucose 02/24/2013 334* 70 - 99 mg/dl Final   Msg sent: Dear Ms Dimas Millin,  Your Hba1c is high, as expected.  Your vitamin D is normal, but the vitamin B12 is low, and I would recommend intramuscular injections to boost your vitamin B12 - you can get these through your PCP or through [place]. Please let me know. Please let me know if you have any questions. Sincerely, Philemon Kingdom MD

## 2013-02-25 LAB — VITAMIN D 25 HYDROXY (VIT D DEFICIENCY, FRACTURES): Vit D, 25-Hydroxy: 33 ng/mL (ref 30–89)

## 2013-02-27 ENCOUNTER — Encounter: Payer: Self-pay | Admitting: Internal Medicine

## 2013-02-27 ENCOUNTER — Other Ambulatory Visit: Payer: Self-pay | Admitting: *Deleted

## 2013-02-27 LAB — VITAMIN B12: Vitamin B-12: 299 pg/mL (ref 211–911)

## 2013-02-27 MED ORDER — INSULIN PEN NEEDLE 32G X 5 MM MISC: Status: DC

## 2013-02-27 NOTE — Telephone Encounter (Signed)
Pt send a message through MyChart requesting pen needles. Done

## 2013-02-28 ENCOUNTER — Encounter: Payer: Self-pay | Admitting: *Deleted

## 2013-02-28 ENCOUNTER — Encounter: Payer: BC Managed Care – PPO | Attending: Internal Medicine | Admitting: *Deleted

## 2013-02-28 VITALS — Ht 62.0 in | Wt 178.5 lb

## 2013-02-28 DIAGNOSIS — Z794 Long term (current) use of insulin: Secondary | ICD-10-CM | POA: Insufficient documentation

## 2013-02-28 DIAGNOSIS — F411 Generalized anxiety disorder: Secondary | ICD-10-CM | POA: Insufficient documentation

## 2013-02-28 DIAGNOSIS — Z713 Dietary counseling and surveillance: Secondary | ICD-10-CM | POA: Insufficient documentation

## 2013-02-28 DIAGNOSIS — E119 Type 2 diabetes mellitus without complications: Secondary | ICD-10-CM | POA: Insufficient documentation

## 2013-02-28 NOTE — Progress Notes (Signed)
Appt start time: 1130 end time:  1230.  Assessment:  Patient was seen on  02/28/13 for individual diabetes education. Ms. Dimas Millin presents with a diagnosis of T2DM in 11/2012 by her PCP. This was identified during her routine physical exam. Her routine physical 11/2011 revealed a FBS of only 146m/dl. She met recently with Dr. CWardell Heathand insulin was prescribed. The patient was afraid to start the insulin and took her first dose last night. Her FBS this date was  1648mdl. This pleased her greatly. [place] home. She feels that her glucose is "out of control" and is eager to learn as much as possible to manage her T2DM effectively. She has no family history of same. She is presently testing her glucose FBS and pp with results ranging from 198-371.   Ms. Dupke's anxiety is rather high and is unable to absorb much information. We discussed the option of attending our Core Classes. We agreed that she would begin classes on 03/02/13 @ 0900. She is excited to be in a class with others sharing her concerns.  Current HbA1c: 10.4%  Preferred Learning Style:   No preference indicated   Learning Readiness:   Change in progress  MEDICATIONS: See List, Metformin, Lantus    Intervention:  Nutrition counseling provided.  Discussed diabetes disease process and treatment options. Discussed role of medications and diet in glucose control  Provided education on meal planning to include protein and no more than 3 servings or carbohydrates as demonstrated with the "Plate Method"  Discussed effects of physical activity on glucose levels and long-term glucose control.  Recommended 150 minutes of physical activity/week.  Reviewed patient medications.  Discussed role of medication on blood glucose and possible side effects  Discussed blood glucose monitoring and interpretation.  Discussed recommended target ranges and individual ranges.    Teaching Method Utilized:   Visual Auditory  Barriers to learning/adherence to lifestyle change: Finances and fear  Demonstrated degree of understanding via:  Verbalized understanding  Monitoring/Evaluation:  Dietary intake, exercise, test glucose, and body weight , begin Core Classes 03/02/13 @ 9:00am

## 2013-03-02 ENCOUNTER — Other Ambulatory Visit: Payer: Self-pay | Admitting: *Deleted

## 2013-03-02 ENCOUNTER — Ambulatory Visit: Payer: BC Managed Care – PPO

## 2013-03-02 ENCOUNTER — Encounter: Payer: Self-pay | Admitting: Family Medicine

## 2013-03-02 ENCOUNTER — Encounter: Payer: Self-pay | Admitting: Internal Medicine

## 2013-03-02 MED ORDER — GLUCOPHAGE XR 500 MG PO TB24: ORAL_TABLET | ORAL | Status: DC

## 2013-03-02 NOTE — Telephone Encounter (Signed)
Formulary change, Glucophage XR (DAW) per Dr Cruzita Lederer.

## 2013-03-03 ENCOUNTER — Encounter: Payer: Self-pay | Admitting: Family Medicine

## 2013-03-09 ENCOUNTER — Ambulatory Visit: Payer: BC Managed Care – PPO

## 2013-03-09 ENCOUNTER — Other Ambulatory Visit: Payer: Self-pay

## 2013-03-09 DIAGNOSIS — Z1231 Encounter for screening mammogram for malignant neoplasm of breast: Secondary | ICD-10-CM

## 2013-03-10 ENCOUNTER — Ambulatory Visit
Admission: RE | Admit: 2013-03-10 | Discharge: 2013-03-10 | Disposition: A | Discharge status: Home / Self Care | Payer: BC Managed Care – PPO | Source: Ambulatory Visit

## 2013-03-10 DIAGNOSIS — Z1231 Encounter for screening mammogram for malignant neoplasm of breast: Secondary | ICD-10-CM

## 2013-03-15 ENCOUNTER — Encounter: Payer: Self-pay | Admitting: Family Medicine

## 2013-03-15 ENCOUNTER — Ambulatory Visit (INDEPENDENT_AMBULATORY_CARE_PROVIDER_SITE_OTHER): Payer: BC Managed Care – PPO | Admitting: Family Medicine

## 2013-03-15 VITALS — BP 112/80 | HR 81 | Temp 98.7°F | Ht 64.5 in | Wt 180.0 lb

## 2013-03-15 DIAGNOSIS — E538 Deficiency of other specified B group vitamins: Secondary | ICD-10-CM

## 2013-03-15 DIAGNOSIS — E785 Hyperlipidemia, unspecified: Secondary | ICD-10-CM

## 2013-03-15 DIAGNOSIS — Z0000 Encounter for general adult medical examination without abnormal findings: Secondary | ICD-10-CM

## 2013-03-15 MED ORDER — ONDANSETRON 4 MG PO TBDP: 4.0000 mg | ORAL_TABLET | Freq: Three times a day (TID) | ORAL | Status: DC | PRN

## 2013-03-15 MED ORDER — SUMATRIPTAN SUCCINATE 100 MG PO TABS: ORAL_TABLET | ORAL | Status: DC

## 2013-03-15 MED ORDER — LOSARTAN POTASSIUM-HCTZ 50-12.5 MG PO TABS: 1.0000 | ORAL_TABLET | Freq: Every day | ORAL | Status: DC

## 2013-03-15 MED ORDER — ALPRAZOLAM 1 MG PO TABS: 1.0000 mg | ORAL_TABLET | Freq: Every evening | ORAL | Status: DC | PRN

## 2013-03-15 MED ORDER — TRAMADOL HCL 50 MG PO TABS: 100.0000 mg | ORAL_TABLET | Freq: Four times a day (QID) | ORAL | Status: DC | PRN

## 2013-03-15 MED ORDER — CYCLOBENZAPRINE HCL 10 MG PO TABS: 10.0000 mg | ORAL_TABLET | Freq: Three times a day (TID) | ORAL | Status: DC | PRN

## 2013-03-15 MED ORDER — CYANOCOBALAMIN 1000 MCG/ML IJ SOLN
1000.0000 ug | Freq: Once | INTRAMUSCULAR | Status: AC
  Administered 2013-03-15: 1000 ug via INTRAMUSCULAR

## 2013-03-15 MED ORDER — MELOXICAM 15 MG PO TABS: ORAL_TABLET | ORAL | Status: DC

## 2013-03-15 MED ORDER — MECLIZINE HCL 25 MG PO TABS: 25.0000 mg | ORAL_TABLET | ORAL | Status: DC | PRN

## 2013-03-15 NOTE — Progress Notes (Signed)
   Subjective:    Patient ID: Katie Palmer, female    DOB: 1950-07-27, 63 y.o.   MRN: [id number]  HPI 63 yr old female for a cpx. She feels well except for some mild generalized fatigue. She is seeing Dr. Cruzita Lederer for her diabetes, and her last A1c was 10.4. Some recent labs found her B12 level to be at the low end of normal, and Dr. Cruzita Lederer suggested she might benefit from supplementing this. She has no signs of neuropathy.    Review of Systems  Constitutional: Positive for fatigue. Negative for fever, chills, diaphoresis, activity change, appetite change and unexpected weight change.  HENT: Negative.   Eyes: Negative.   Respiratory: Negative.   Cardiovascular: Negative.   Gastrointestinal: Negative.   Genitourinary: Negative for dysuria, urgency, frequency, hematuria, flank pain, decreased urine volume, enuresis, difficulty urinating, pelvic pain and dyspareunia.  Musculoskeletal: Negative.   Skin: Negative.   Neurological: Negative.   Psychiatric/Behavioral: Negative.        Objective:   Physical Exam  Constitutional: She is oriented to person, place, and time. She appears well-developed and well-nourished. No distress.  HENT:  Head: Normocephalic and atraumatic.  Right Ear: External ear normal.  Left Ear: External ear normal.  Nose: Nose normal.  Mouth/Throat: Oropharynx is clear and moist. No oropharyngeal exudate.  Eyes: Conjunctivae and EOM are normal. Pupils are equal, round, and reactive to light. No scleral icterus.  Neck: Normal range of motion. Neck supple. No JVD present. No thyromegaly present.  Cardiovascular: Normal rate, regular rhythm, normal heart sounds and intact distal pulses.  Exam reveals no gallop and no friction rub.   No murmur heard. EKG normal   Pulmonary/Chest: Effort normal and breath sounds normal. No respiratory distress. She has no wheezes. She has no rales. She exhibits no tenderness.  Abdominal: Soft. Bowel sounds are normal. She exhibits no  distension and no mass. There is no tenderness. There is no rebound and no guarding.  Musculoskeletal: Normal range of motion. She exhibits no edema and no tenderness.  Lymphadenopathy:    She has no cervical adenopathy.  Neurological: She is alert and oriented to person, place, and time. She has normal reflexes. No cranial nerve deficit. She exhibits normal muscle tone. Coordination normal.  Skin: Skin is warm and dry. No rash noted. No erythema.  Psychiatric: She has a normal mood and affect. Her behavior is normal. Judgment and thought content normal.          Assessment & Plan:  Well exam. We will give her B12 shots weekly for 12 weeks and then check another level.

## 2013-03-15 NOTE — Progress Notes (Signed)
Pre visit review using our clinic review tool, if applicable. No additional management support is needed unless otherwise documented below in the visit note. 

## 2013-03-16 ENCOUNTER — Ambulatory Visit: Payer: BC Managed Care – PPO

## 2013-03-22 ENCOUNTER — Ambulatory Visit: Payer: BC Managed Care – PPO | Admitting: Family Medicine

## 2013-03-22 ENCOUNTER — Telehealth: Payer: Self-pay | Admitting: Family Medicine

## 2013-03-22 NOTE — Telephone Encounter (Signed)
It will be okay

## 2013-03-22 NOTE — Telephone Encounter (Signed)
Pt would like to resc b12 inj for Friday. You already have 2 inj. Is it ok to sch? Pt states she doesn't mind waiting.

## 2013-03-22 NOTE — Telephone Encounter (Signed)
Done. thanks

## 2013-03-24 ENCOUNTER — Ambulatory Visit: Payer: BC Managed Care – PPO

## 2013-03-24 ENCOUNTER — Ambulatory Visit: Payer: BC Managed Care – PPO | Admitting: Family Medicine

## 2013-03-27 ENCOUNTER — Ambulatory Visit: Payer: BC Managed Care – PPO | Admitting: Internal Medicine

## 2013-03-30 ENCOUNTER — Ambulatory Visit: Payer: BC Managed Care – PPO | Admitting: Family Medicine

## 2013-03-30 ENCOUNTER — Other Ambulatory Visit (INDEPENDENT_AMBULATORY_CARE_PROVIDER_SITE_OTHER): Payer: BC Managed Care – PPO

## 2013-03-30 DIAGNOSIS — E538 Deficiency of other specified B group vitamins: Secondary | ICD-10-CM

## 2013-03-30 DIAGNOSIS — E785 Hyperlipidemia, unspecified: Secondary | ICD-10-CM

## 2013-03-30 LAB — LIPID PANEL
Cholesterol: 197 mg/dL (ref 0–200)
HDL: 62.6 mg/dL (ref >39.00)
LDL Cholesterol: 122 mg/dL — ABNORMAL HIGH (ref 0–99)
Total CHOL/HDL Ratio: 3
Triglycerides: 63 mg/dL (ref 0.0–149.0)
VLDL: 12.6 mg/dL (ref 0.0–40.0)

## 2013-03-30 MED ORDER — CYANOCOBALAMIN 1000 MCG/ML IJ SOLN
1000.0000 ug | Freq: Once | INTRAMUSCULAR | Status: AC
  Administered 2013-03-30: 1000 ug via INTRAMUSCULAR

## 2013-04-14 ENCOUNTER — Encounter: Payer: Self-pay | Admitting: Internal Medicine

## 2013-04-14 ENCOUNTER — Ambulatory Visit (INDEPENDENT_AMBULATORY_CARE_PROVIDER_SITE_OTHER): Payer: BC Managed Care – PPO | Admitting: Internal Medicine

## 2013-04-14 VITALS — BP 118/70 | HR 78 | Temp 98.3°F | Resp 12 | Wt 179.0 lb

## 2013-04-14 DIAGNOSIS — E119 Type 2 diabetes mellitus without complications: Secondary | ICD-10-CM

## 2013-04-14 MED ORDER — SITAGLIPTIN PHOSPHATE 100 MG PO TABS: 100.0000 mg | ORAL_TABLET | Freq: Every day | ORAL | Status: DC

## 2013-04-14 NOTE — Progress Notes (Signed)
Patient ID: Katie Palmer, female   DOB: 05-13-1950, 63 y.o.   MRN: [id number]  HPI: Katie Palmer is a 63 y.o.-year-old female, returning for f/u for DM2, dx 11/2012, uncontrolled, without complications. Last visit 1.5 mo ago.  Patient has been diagnosed with diabetes in 11/2012 - before then, she started to feel very fatigued and to have frequent urination >> started to check sugars: 219-258. She changed her diet and started to exercise >> started to lose weight, but she feels this is not entirely intentionally: ~50 lbs in a year.  Hemoglobin A1c was: Lab Results  Component Value Date   HGBA1C 10.4* 02/24/2013   HGBA1C 10.0* 11/29/2012   Pt is on a regimen of: - Metformin 1000 mg po bid - can tolerate well the high dose - Lantus 15 - started at last visit She was on Glipizide 10 mg bid >> weight gain  She has an AccuChek Nano given at last visit.  Pt checks her sugars 1-3 a day and they are LOW per her detailed log: - am: 187-293 >> 47-150, most 50-90 - 2h after b'fast: n/c >> 59-149 - before lunch: 198-241 >> 58-100 - 2h after lunch: 174-261 >> 72-221 (130-140) - before dinner: 164-347 >> 83-107 - 2h after dinner: n/c >> 101-185 - bedtime: 198-371 -midnight  >> 131-234 - nighttime: No lows. ? If has hypoglycemia awareness. Highest sugar was 300s.  Pt's meals are: - Breakfast: boiled egg; not oatmeal - Lunch: salad - watches the dressing - Dinner: vegetable soup - made from scratch - Snacks: no No bread, no soft drinks. Exercises 3-5 x a week  - no CKD, last BUN/creatinine:  Lab Results  Component Value Date   BUN 16 11/29/2012   CREATININE 0.7 11/29/2012  She is on Losartan. - last set of lipids: Lab Results  Component Value Date   CHOL 197 03/30/2013   HDL 62.60 03/30/2013   LDLCALC 122* 03/30/2013   LDLDIRECT 145.5 02/04/2009   TRIG 63.0 03/30/2013   CHOLHDL 3 03/30/2013  She is not on a statin. - no previous dilated eye exam >> I referred her to Dr Prudencio Burly at last visit. -  no numbness and tingling in her feet.  I reviewed pt's medications, allergies, PMH, social hx, family hx and no changes required, except as mentioned above.  ROS: Constitutional: no weight loss, + increased appetite, + fatigue, + both subjective hyperthermia/hypothermia, + poor sleep Eyes: no blurry vision, no xerophthalmia ENT: no sore throat, no nodules palpated in throat, no dysphagia/odynophagia, no hoarseness Cardiovascular: no CP/SOB/palpitations/leg swelling Respiratory: no cough/SOB Gastrointestinal: no N/V/D/C Musculoskeletal: + muscle aches/no joint aches Skin: no rashes, + itching Neurological: no tremors/numbness/tingling/dizziness  PE: BP 118/70  Pulse 78  Temp(Src) 98.3 F (36.8 C) (Oral)  Resp 12  Wt 179 lb (81.194 kg)  SpO2 96% Wt Readings from Last 3 Encounters:  04/14/13 179 lb (81.194 kg)  03/15/13 180 lb (81.647 kg)  02/28/13 178 lb 8 oz (80.967 kg)   Constitutional: overweight, in NAD Eyes: PERRLA, EOMI, no exophthalmos ENT: moist mucous membranes, no thyromegaly, no cervical lymphadenopathy Cardiovascular: RRR, No MRG Respiratory: CTA B Gastrointestinal: abdomen soft, NT, ND, BS+ Musculoskeletal: no deformities, strength intact in all 4 Skin: moist, warm, no rashes Neurological: no tremor with outstretched hands, DTR normal in all 4  ASSESSMENT: 1. DM2, insulin-dependent, uncontrolled, without complications  2. Fatigue  PLAN:  1. Patient with recently dx-ed diabetes, on oral antidiabetic regimen + insulin added at last visit >> despite  starting at a low dose, she had a significant decrease in sugars and has frequent lows. - We discussed about options for treatment, and I suggested to:  Patient Instructions  Please come back for a follow-up appointment in 1 month - bring your sugar log. Please continue Metformin 1000 mg 2x a day. Please decrease Lantus to 10 units at bedtime. If sugars still <80 in am, please stop Lantus. Let's start Januvia 100  mg daily in am after you come off Lantus if sugars later in the day are still high.  Please let me know in 2 weeks if sugars are still >200 or if they are low <80. - Continue checking sugars at different times of the day - check 2 times a day, rotating checks - referred to nutrition - she is also interested in Diabetes education classes >> saw Katie Palmer - check A1c at next visit - Return to clinic in 1 mo with sugar log

## 2013-04-14 NOTE — Patient Instructions (Addendum)
Please come back for a follow-up appointment in 1 month - bring your sugar log. Please continue Metformin 1000 mg 2x a day. Please decrease Lantus to 10 units at bedtime. If sugars still <80 in am, please stop Lantus. Let's start Januvia 100 mg daily in am after you come off Lantus if sugars later in the day are still high.  Please let me know in 2 weeks if sugars are still >200 or if they are low <80.

## 2013-04-17 ENCOUNTER — Encounter: Payer: Self-pay | Admitting: Internal Medicine

## 2013-04-18 ENCOUNTER — Encounter: Payer: Self-pay | Admitting: Family Medicine

## 2013-04-20 ENCOUNTER — Encounter: Payer: Self-pay | Admitting: Internal Medicine

## 2013-04-21 ENCOUNTER — Encounter: Payer: Self-pay | Admitting: Internal Medicine

## 2013-05-04 LAB — HM DIABETES EYE EXAM

## 2013-05-05 ENCOUNTER — Encounter: Payer: Self-pay | Admitting: Internal Medicine

## 2013-05-09 ENCOUNTER — Ambulatory Visit: Payer: BC Managed Care – PPO | Admitting: Internal Medicine

## 2013-05-11 ENCOUNTER — Telehealth: Payer: Self-pay | Admitting: Family Medicine

## 2013-05-11 MED ORDER — TRAMADOL HCL 50 MG PO TABS: 100.0000 mg | ORAL_TABLET | Freq: Four times a day (QID) | ORAL | Status: DC | PRN

## 2013-05-11 NOTE — Telephone Encounter (Signed)
Pt requested a refill for Ultram and needs the okay to fill Xanax 2 days early. Per Dr. [name] call in Ultram with 2 refills and give the okay for early refill on Xanax. I called this in to [facility] and spoke with pt.

## 2013-05-16 ENCOUNTER — Ambulatory Visit: Payer: BC Managed Care – PPO | Admitting: Internal Medicine

## 2013-05-18 ENCOUNTER — Ambulatory Visit: Payer: BC Managed Care – PPO | Admitting: Internal Medicine

## 2013-05-23 ENCOUNTER — Ambulatory Visit: Payer: BC Managed Care – PPO | Admitting: Internal Medicine

## 2013-05-30 ENCOUNTER — Ambulatory Visit: Payer: BC Managed Care – PPO | Admitting: Internal Medicine

## 2013-06-05 ENCOUNTER — Encounter: Payer: Self-pay | Admitting: Internal Medicine

## 2013-06-05 ENCOUNTER — Ambulatory Visit (INDEPENDENT_AMBULATORY_CARE_PROVIDER_SITE_OTHER): Payer: BC Managed Care – PPO | Admitting: Internal Medicine

## 2013-06-05 VITALS — BP 118/82 | HR 85 | Temp 98.2°F | Resp 14 | Ht 64.5 in | Wt 166.0 lb

## 2013-06-05 DIAGNOSIS — E119 Type 2 diabetes mellitus without complications: Secondary | ICD-10-CM

## 2013-06-05 LAB — HEMOGLOBIN A1C: Hgb A1c MFr Bld: 10.4 % — ABNORMAL HIGH (ref 4.6–6.5)

## 2013-06-05 MED ORDER — INSULIN PEN NEEDLE 32G X 5 MM MISC: Status: DC

## 2013-06-05 MED ORDER — INSULIN GLARGINE 100 UNIT/ML SOLOSTAR PEN: 12.0000 [IU] | PEN_INJECTOR | Freq: Every day | SUBCUTANEOUS | Status: DC

## 2013-06-05 MED ORDER — CANAGLIFLOZIN 100 MG PO TABS: ORAL_TABLET | ORAL | Status: DC

## 2013-06-05 NOTE — Patient Instructions (Signed)
Please change the diabetes regimen as follows: - Lantus 12 units at bedtime - Januvia 100 mg in am - start Invokana 100 mg in am Please send me a message in 2 weeks with your sugars.  Please return in 1 month with your sugar log.   Please stop at the lab.

## 2013-06-05 NOTE — Progress Notes (Signed)
Patient ID: Katie Palmer, female   DOB: 1950/12/25, 63 y.o.   MRN: [id number]  HPI: Katie Palmer is a 63 y.o.-year-old female, returning for f/u for DM2, dx 11/2012, uncontrolled, without complications. Last visit 1.5 mo ago.  Last Hemoglobin A1c was: Lab Results  Component Value Date   HGBA1C 10.4* 02/24/2013   HGBA1C 10.0* 11/29/2012   Pt is on a regimen of: - Metformin 1000 mg po bid - can tolerate well the high dose - Lantus 12 or 15 units at bedtime - Januvia 100 mg daily  She was on Glipizide 10 mg bid >> weight gain  We tried to stop the Lantus as we started Januvia but her sugars greatly increased then. Reviewed tel notes since last visit:  From: Palmer,Katie  Sent: 04/20/2013 8:09 PM EDT  To: Philemon Kingdom, MD  Subject: Non-Urgent Medical Question   I have taken Januvia for 2 days. Readings are way off  3/24 131. 7am 3/25 224. 8am 3/26 344. 11 am  326. 5pm 395 6pm 394. 11am  222. 9pm 439 9pm 436. 8pm  do you want me to go back on insulin?   04/21/2013 11:13 AM  Dear Katie Palmer,  YES! Let's go back to 10 units of Lantus and increase to 12 if needed. Please let me know about your sugars after the weekend. Can keep Januvia on, too, but it is clear that it is not enough.  Sincerely,  Philemon Kingdom MD   From: Palmer,Katie  Sent: 04/21/2013 11:16 AM EDT  To: Philemon Kingdom, MD  Subject: Non-Urgent Medical Question   Dr Cruzita Lederer  I took 10 cc of insulin last night. Took 3rd day of Januvia today.  Morning reading - 271  Ate bowl of cereal  Reading after 2 hours - 311   From: Philemon Kingdom, MD  Sent: 04/21/2013 12:33 PM EDT  To: Katie Palmer  Subject: RE: Non-Urgent Medical Question   Katie Palmer,  If sugars do not improve in the next 2-3 days increase the insulin dose to 12, then to 15 units.  Philemon Kingdom MD   She has an Gaffer given at last visit.  Pt checks her sugars 1-3 a day and they are very fluctuating per her detailed log: - am: 187-293  >> 47-150, most 50-90 >> 52-507 - 2h after b'fast: n/c >> 59-149 >> 216, 328 - before lunch: 198-241 >> 58-100 >> 121-391 - 2h after lunch: 174-261 >> 72-221 (130-140) >> 249-332 - before dinner: 164-347 >> 83-107 >> 249-535 - 2h after dinner: n/c >> 101-185 >> 298-450 - bedtime: 198-371 -midnight  >> 131-234 >> n/c - nighttime: ? If has hypoglycemia awareness. Highest sugar was >500.  Pt's meals are: - Breakfast: boiled egg; not oatmeal - Lunch: salad - watches the dressing - Dinner: vegetable soup - made from scratch - Snacks: no No bread, no soft drinks. Exercises 3-5 x a week, walks daily, feels stronger.   - no CKD, last BUN/creatinine:  Lab Results  Component Value Date   BUN 16 11/29/2012   CREATININE 0.7 11/29/2012  She is on Losartan. - last set of lipids: Lab Results  Component Value Date   CHOL 197 03/30/2013   HDL 62.60 03/30/2013   LDLCALC 122* 03/30/2013   LDLDIRECT 145.5 02/04/2009   TRIG 63.0 03/30/2013   CHOLHDL 3 03/30/2013  She is not on a statin. - last eye exam - Dr Prudencio Burly >> no DR - no numbness and tingling in her feet.  I reviewed  pt's medications, allergies, PMH, social hx, family hx and no changes required, except as mentioned above.  ROS: Constitutional: + weight loss, no increased appetite, no fatigue, + both subjective hyperthermia/hypothermia, + poor sleep Eyes: + blurry vision, no xerophthalmia ENT: no sore throat, no nodules palpated in throat, no dysphagia/odynophagia, no hoarseness Cardiovascular: no CP/SOB/palpitations/leg swelling Respiratory: no cough/SOB Gastrointestinal: no N/V/D/C Musculoskeletal: + muscle aches/no joint aches Skin: no rashes, + easy bruising Neurological: no tremors/numbness/tingling/dizziness, + HA  PE: BP 118/82  Pulse 85  Temp(Src) 98.2 F (36.8 C)  Resp 14  Ht 5' 4.5" (1.638 m)  Wt 166 lb (75.297 kg)  BMI 28.06 kg/m2  SpO2 95% Wt Readings from Last 3 Encounters:  06/05/13 166 lb (75.297 kg)  04/14/13 179 lb  (81.194 kg)  03/15/13 180 lb (81.647 kg)   Constitutional: overweight, in NAD Eyes: PERRLA, EOMI, no exophthalmos ENT: moist mucous membranes, no thyromegaly, no cervical lymphadenopathy Cardiovascular: RRR, No MRG Respiratory: CTA B Gastrointestinal: abdomen soft, NT, ND, BS+ Musculoskeletal: no deformities, strength intact in all 4 Skin: moist, warm, no rashes Neurological: no tremor with outstretched hands, DTR normal in all 4  ASSESSMENT: 1. DM2, insulin-dependent, uncontrolled, without complications  2. Fatigue  PLAN:  1. Patient with recently dx-ed diabetes, on oral antidiabetic regimen + basal insulin. Her sugars worsened considerably since last visit, with very fluctuating values. She lost weight through changing diet, exercise, but I believe also 2/2 high sugars >> glycosuria. - We discussed about options for treatment, and I believe that she ultimately needs mealtime insulin but will try the following first (we also need to be very mindful of finances, she is very concerned about not affording her insulin - has been getting samples):  Patient Instructions  Please change the diabetes regimen as follows: - Lantus 12 units at bedtime - Januvia 100 mg in am - start Invokana 100 mg in am Please send me a message in 2 weeks with your sugars. Please return in 1 month with your sugar log.  Please stop at the lab. - we discussed about SEs of Invokana, which are: dizziness (advised to be careful when stands from sitting position), decreased BP - usually not < normal (BP today is not low), and fungal UTIs (advised to let me know if develops one).  - given discount card for Invokana - given samples of Levemir (we did not have Lantus) - Continue checking sugars at different times of the day - check 2 times a day, rotating checks - she has diabetes classes and meets with the [facility] support group in 2 weeks - check A1c today - Return to clinic in 1 mo with sugar log - but  advised to call me in 2 weeks to see if we need to increase Invokana or add Glipizide back or change to mealtime insulin  Office Visit on 06/05/2013  Component Date Value Ref Range Status  . Hemoglobin A1C 06/05/2013 10.4* 4.6 - 6.5 % Final   Glycemic Control Guidelines for People with Diabetes:Non Diabetic:  <6%Goal of Therapy: <7%Additional Action Suggested:  >8%    HbA1c same.

## 2013-07-28 ENCOUNTER — Inpatient Hospital Stay (HOSPITAL_COMMUNITY)
Admission: EM | Admit: 2013-07-28 | Discharge: 2013-07-30 | DRG: 639 | Disposition: A | Discharge status: Home / Self Care | Payer: BC Managed Care – PPO | Attending: Internal Medicine | Admitting: Internal Medicine

## 2013-07-28 ENCOUNTER — Encounter (HOSPITAL_COMMUNITY): Payer: Self-pay | Admitting: Emergency Medicine

## 2013-07-28 ENCOUNTER — Emergency Department (HOSPITAL_COMMUNITY): Payer: BC Managed Care – PPO

## 2013-07-28 DIAGNOSIS — E860 Dehydration: Secondary | ICD-10-CM | POA: Diagnosis not present

## 2013-07-28 DIAGNOSIS — E1310 Other specified diabetes mellitus with ketoacidosis without coma: Secondary | ICD-10-CM

## 2013-07-28 DIAGNOSIS — I10 Essential (primary) hypertension: Secondary | ICD-10-CM | POA: Diagnosis present

## 2013-07-28 DIAGNOSIS — E1010 Type 1 diabetes mellitus with ketoacidosis without coma: Principal | ICD-10-CM | POA: Diagnosis present

## 2013-07-28 DIAGNOSIS — M791 Myalgia, unspecified site: Secondary | ICD-10-CM

## 2013-07-28 DIAGNOSIS — Z860100 Personal history of colon polyps, unspecified: Secondary | ICD-10-CM

## 2013-07-28 DIAGNOSIS — Z6826 Body mass index (BMI) 26.0-26.9, adult: Secondary | ICD-10-CM | POA: Diagnosis not present

## 2013-07-28 DIAGNOSIS — IMO0001 Reserved for inherently not codable concepts without codable children: Secondary | ICD-10-CM | POA: Diagnosis present

## 2013-07-28 DIAGNOSIS — D72829 Elevated white blood cell count, unspecified: Secondary | ICD-10-CM | POA: Diagnosis present

## 2013-07-28 DIAGNOSIS — Z794 Long term (current) use of insulin: Secondary | ICD-10-CM | POA: Diagnosis not present

## 2013-07-28 DIAGNOSIS — E1110 Type 2 diabetes mellitus with ketoacidosis without coma: Secondary | ICD-10-CM | POA: Diagnosis present

## 2013-07-28 DIAGNOSIS — K219 Gastro-esophageal reflux disease without esophagitis: Secondary | ICD-10-CM | POA: Diagnosis present

## 2013-07-28 DIAGNOSIS — E43 Unspecified severe protein-calorie malnutrition: Secondary | ICD-10-CM | POA: Diagnosis present

## 2013-07-28 DIAGNOSIS — G4700 Insomnia, unspecified: Secondary | ICD-10-CM | POA: Diagnosis present

## 2013-07-28 DIAGNOSIS — E785 Hyperlipidemia, unspecified: Secondary | ICD-10-CM

## 2013-07-28 DIAGNOSIS — M797 Fibromyalgia: Secondary | ICD-10-CM | POA: Diagnosis present

## 2013-07-28 HISTORY — DX: Dizziness and giddiness: R42

## 2013-07-28 LAB — BASIC METABOLIC PANEL
ANION GAP: 38 — AB (ref 5–15)
Anion gap: 32 — ABNORMAL HIGH (ref 5–15)
Anion gap: 23 — ABNORMAL HIGH (ref 5–15)
Anion gap: 20 — ABNORMAL HIGH (ref 5–15)
BUN: 30 mg/dL — ABNORMAL HIGH (ref 6–23)
BUN: 28 mg/dL — ABNORMAL HIGH (ref 6–23)
BUN: 24 mg/dL — ABNORMAL HIGH (ref 6–23)
BUN: 22 mg/dL (ref 6–23)
CALCIUM: 8.2 mg/dL — AB (ref 8.4–10.5)
CHLORIDE: 89 meq/L — AB (ref 96–112)
CO2: 8 meq/L — AB (ref 19–32)
CO2: 7 mEq/L — CL (ref 19–32)
CO2: 13 mEq/L — ABNORMAL LOW (ref 19–32)
CO2: 13 mEq/L — ABNORMAL LOW (ref 19–32)
Calcium: 10.1 mg/dL (ref 8.4–10.5)
Calcium: 8.3 mg/dL — ABNORMAL LOW (ref 8.4–10.5)
Calcium: 8.4 mg/dL (ref 8.4–10.5)
Chloride: 99 mEq/L (ref 96–112)
Chloride: 105 mEq/L (ref 96–112)
Chloride: 108 mEq/L (ref 96–112)
Creatinine, Ser: 0.99 mg/dL (ref 0.50–1.10)
Creatinine, Ser: 0.76 mg/dL (ref 0.50–1.10)
Creatinine, Ser: 0.7 mg/dL (ref 0.50–1.10)
Creatinine, Ser: 0.64 mg/dL (ref 0.50–1.10)
GFR calc Af Amer: 68 mL/min — ABNORMAL LOW (ref >90)
GFR calc Af Amer: >90 mL/min (ref >90)
GFR calc non Af Amer: 59 mL/min — ABNORMAL LOW (ref >90)
GFR calc non Af Amer: 87 mL/min — ABNORMAL LOW (ref >90)
GFR, EST NON AFRICAN AMERICAN: 90 mL/min — AB (ref >90)
GLUCOSE: 421 mg/dL — AB (ref 70–99)
Glucose, Bld: 278 mg/dL — ABNORMAL HIGH (ref 70–99)
Glucose, Bld: 123 mg/dL — ABNORMAL HIGH (ref 70–99)
Glucose, Bld: 78 mg/dL (ref 70–99)
POTASSIUM: 5 meq/L (ref 3.7–5.3)
POTASSIUM: 4.2 meq/L (ref 3.7–5.3)
Potassium: 5.2 mEq/L (ref 3.7–5.3)
Potassium: 4.4 mEq/L (ref 3.7–5.3)
SODIUM: 135 meq/L — AB (ref 137–147)
Sodium: 138 mEq/L (ref 137–147)
Sodium: 141 mEq/L (ref 137–147)
Sodium: 141 mEq/L (ref 137–147)

## 2013-07-28 LAB — HEPATIC FUNCTION PANEL
ALT: 23 U/L (ref 0–35)
AST: 16 U/L (ref 0–37)
Albumin: 4.6 g/dL (ref 3.5–5.2)
Alkaline Phosphatase: 103 U/L (ref 39–117)
Bilirubin, Direct: <0.2 mg/dL (ref 0.0–0.3)
Total Bilirubin: 0.3 mg/dL (ref 0.3–1.2)
Total Protein: 8.5 g/dL — ABNORMAL HIGH (ref 6.0–8.3)

## 2013-07-28 LAB — I-STAT TROPONIN, ED: TROPONIN I, POC: 0 ng/mL (ref 0.00–0.08)

## 2013-07-28 LAB — GLUCOSE, CAPILLARY
GLUCOSE-CAPILLARY: 137 mg/dL — AB (ref 70–99)
Glucose-Capillary: 234 mg/dL — ABNORMAL HIGH (ref 70–99)
Glucose-Capillary: 271 mg/dL — ABNORMAL HIGH (ref 70–99)
Glucose-Capillary: 85 mg/dL (ref 70–99)
Glucose-Capillary: 90 mg/dL (ref 70–99)

## 2013-07-28 LAB — MRSA PCR SCREENING: MRSA by PCR: NEGATIVE

## 2013-07-28 LAB — CBC
HCT: 51.4 % — ABNORMAL HIGH (ref 36.0–46.0)
HEMOGLOBIN: 17.3 g/dL — AB (ref 12.0–15.0)
MCH: 30.8 pg (ref 26.0–34.0)
MCHC: 33.7 g/dL (ref 30.0–36.0)
MCV: 91.6 fL (ref 78.0–100.0)
Platelets: 324 10*3/uL (ref 150–400)
RBC: 5.61 MIL/uL — ABNORMAL HIGH (ref 3.87–5.11)
RDW: 13.8 % (ref 11.5–15.5)
WBC: 15.2 10*3/uL — ABNORMAL HIGH (ref 4.0–10.5)

## 2013-07-28 LAB — CBG MONITORING, ED: Glucose-Capillary: 286 mg/dL — ABNORMAL HIGH (ref 70–99)

## 2013-07-28 MED ORDER — MECLIZINE HCL 25 MG PO TABS
25.0000 mg | ORAL_TABLET | ORAL | Status: DC | PRN
  Filled 2013-07-28: qty 1

## 2013-07-28 MED ORDER — PANTOPRAZOLE SODIUM 40 MG IV SOLR
40.0000 mg | Freq: Once | INTRAVENOUS | Status: AC
  Administered 2013-07-28: 40 mg via INTRAVENOUS
  Filled 2013-07-28: qty 40

## 2013-07-28 MED ORDER — SODIUM CHLORIDE 0.9 % IV SOLN
INTRAVENOUS | Status: DC
  Administered 2013-07-28: 2.8 [IU]/h via INTRAVENOUS
  Filled 2013-07-28: qty 1

## 2013-07-28 MED ORDER — POTASSIUM CHLORIDE 10 MEQ/100ML IV SOLN
10.0000 meq | INTRAVENOUS | Status: AC
  Administered 2013-07-28 (×2): 10 meq via INTRAVENOUS
  Filled 2013-07-28 (×2): qty 100

## 2013-07-28 MED ORDER — DEXTROSE-NACL 5-0.45 % IV SOLN: INTRAVENOUS | Status: DC

## 2013-07-28 MED ORDER — TRAMADOL HCL 50 MG PO TABS
100.0000 mg | ORAL_TABLET | Freq: Four times a day (QID) | ORAL | Status: DC | PRN
  Administered 2013-07-28 – 2013-07-30 (×5): 100 mg via ORAL
  Filled 2013-07-28 (×5): qty 2

## 2013-07-28 MED ORDER — ONDANSETRON HCL 4 MG/2ML IJ SOLN: 4.0000 mg | Freq: Once | INTRAMUSCULAR | Status: DC

## 2013-07-28 MED ORDER — ALPRAZOLAM 1 MG PO TABS
1.0000 mg | ORAL_TABLET | Freq: Every evening | ORAL | Status: DC | PRN
  Administered 2013-07-29 (×2): 1 mg via ORAL
  Filled 2013-07-28: qty 2
  Filled 2013-07-28: qty 1

## 2013-07-28 MED ORDER — ENOXAPARIN SODIUM 40 MG/0.4ML ~~LOC~~ SOLN
40.0000 mg | SUBCUTANEOUS | Status: DC
  Administered 2013-07-28 – 2013-07-29 (×2): 40 mg via SUBCUTANEOUS
  Filled 2013-07-28 (×3): qty 0.4

## 2013-07-28 MED ORDER — PANTOPRAZOLE SODIUM 40 MG IV SOLR
40.0000 mg | Freq: Every day | INTRAVENOUS | Status: DC
  Administered 2013-07-28 – 2013-07-29 (×2): 40 mg via INTRAVENOUS
  Filled 2013-07-28 (×2): qty 40

## 2013-07-28 MED ORDER — DEXTROSE 50 % IV SOLN: 25.0000 mL | INTRAVENOUS | Status: DC | PRN

## 2013-07-28 MED ORDER — VITAMINS A & D EX OINT
TOPICAL_OINTMENT | CUTANEOUS | Status: AC
  Administered 2013-07-28: 5
  Filled 2013-07-28: qty 5

## 2013-07-28 MED ORDER — SODIUM CHLORIDE 0.9 % IV SOLN
INTRAVENOUS | Status: DC
  Administered 2013-07-28: 15:00:00 via INTRAVENOUS

## 2013-07-28 MED ORDER — DEXTROSE-NACL 5-0.45 % IV SOLN
INTRAVENOUS | Status: AC
  Administered 2013-07-28: 20:00:00 via INTRAVENOUS

## 2013-07-28 MED ORDER — SODIUM CHLORIDE 0.9 % IV SOLN: INTRAVENOUS | Status: AC

## 2013-07-28 MED ORDER — ONDANSETRON HCL 4 MG/2ML IJ SOLN
4.0000 mg | Freq: Once | INTRAMUSCULAR | Status: AC
  Administered 2013-07-28: 4 mg via INTRAVENOUS
  Filled 2013-07-28: qty 2

## 2013-07-28 MED ORDER — MELOXICAM 15 MG PO TABS
15.0000 mg | ORAL_TABLET | Freq: Every morning | ORAL | Status: DC
  Administered 2013-07-29 – 2013-07-30 (×2): 15 mg via ORAL
  Filled 2013-07-28 (×2): qty 1

## 2013-07-28 MED ORDER — SODIUM CHLORIDE 0.9 % IV BOLUS (SEPSIS)
1000.0000 mL | Freq: Once | INTRAVENOUS | Status: AC
  Administered 2013-07-28: 1000 mL via INTRAVENOUS

## 2013-07-28 MED ORDER — SUMATRIPTAN SUCCINATE 100 MG PO TABS
100.0000 mg | ORAL_TABLET | ORAL | Status: DC | PRN
  Administered 2013-07-28 – 2013-07-29 (×2): 100 mg via ORAL
  Filled 2013-07-28 (×2): qty 1

## 2013-07-28 MED ORDER — HYDROMORPHONE HCL PF 1 MG/ML IJ SOLN
1.0000 mg | Freq: Once | INTRAMUSCULAR | Status: AC
  Administered 2013-07-28: 1 mg via INTRAVENOUS
  Filled 2013-07-28: qty 1

## 2013-07-28 MED ORDER — SODIUM CHLORIDE 0.9 % IV SOLN
INTRAVENOUS | Status: AC
  Administered 2013-07-28: 4.5 [IU]/h via INTRAVENOUS
  Filled 2013-07-28: qty 1

## 2013-07-28 MED ORDER — SODIUM CHLORIDE 0.9 % IV BOLUS (SEPSIS)
2000.0000 mL | Freq: Once | INTRAVENOUS | Status: AC
Start: 2013-07-28 — End: 2013-07-28
  Administered 2013-07-28: 2000 mL via INTRAVENOUS

## 2013-07-28 MED ORDER — SODIUM BICARBONATE 8.4 % IV SOLN
50.0000 meq | Freq: Once | INTRAVENOUS | Status: AC
  Administered 2013-07-28: 50 meq via INTRAVENOUS
  Filled 2013-07-28: qty 50

## 2013-07-28 MED ORDER — ONDANSETRON HCL 4 MG/2ML IJ SOLN: 4.0000 mg | Freq: Four times a day (QID) | INTRAMUSCULAR | Status: DC | PRN

## 2013-07-28 MED ORDER — SODIUM CHLORIDE 0.9 % IV SOLN
INTRAVENOUS | Status: DC
  Administered 2013-07-28: 18:00:00 via INTRAVENOUS

## 2013-07-28 NOTE — H&P (Addendum)
History and PhysicalErma Palmer JKD:[id number] DOB: Oct 20, 1950 DOA: 07/28/2013  Referring physician: Dr. Eulis Foster PCP: Laurey Morale, MD   Chief Complaint: Myalgias, weakness, dizziness, vomiting.  History of Present Illness:   Katie Palmer is an 63 y.o. female with a PMH of HTN, uncontrolled DM (last hemoglobin A1c 10.4% 06/05/13), who presets with a one week history of insomnia accompanied by leg cramps, difficulty concentrating, fatigue, myalgias, and over the past 48 hours, chest pain/indigestion (burning sensation relieved by vomiting), palpitations, but no SOB, cough.  The patient states she was diagnosed with DM 11/14, and has had difficulty controlling her blood glucoses despite seeing an endocrinologist and trying multiple oral medications.  She saw her endocrinologist on 06/05/13 and was put on Invokana at that time.  She says she has felt poorly since she started this medication and has had longstanding polyuria and polydipsia, along with profound weight loss since the time of diagnosis (95 pounds).  She has been compliant with her medications, and hasn't noticed any aggravating or alleviating factors.  Upon initial evaluation in the ED, the patient's glucose was 421, bicarbonate was 8 with an anion gap of 38.  ROS:   Constitutional: No fever, + chills;  Appetite diminished over past 24 hours; + 95 lb weight loss in 9 months, 10 lbs in past week, +fatigue.  HEENT: + blurry vision, no diplopia, no pharyngitis, no dysphagia CV: + chest pain, + palpitations, no PND, no orthopnea, no edema.  Resp: No SOB, no cough, no pleuritic pain. GI: + nausea, + vomiting, no diarrhea, no melena, no hematochezia, + constipation x 1 week, no abdominal pain.  GU: No dysuria, no hematuria, + frequency, no urgency. MSK: + myalgias, + arthralgias.  Neuro:  + chronic migraine headache, no focal neurological deficits, no history of seizures.  Psych: + depression, + anxiety.  Endo: No heat intolerance, no cold  intolerance, + polyuria, + polydipsia  Skin: + recent facial rashe, no skin lesions.  Heme: No easy bruising.  Travel history: No recent travel.   Past Medical History:   Past Medical History  Diagnosis Date  . Hx of colonic polyps   . Depression   . Headache(784.0)   . Hypertension   . Insomnia   . Fibromyalgia   . Diabetes mellitus without complication   . Vertigo     Past Surgical History:   Past Surgical History  Procedure Laterality Date  . Pulmonary stress test      Normal on 02/04/09. Sees Dr. Philis Pique for GYN exams  . Tonsillectomy    . Colonoscopy  Feb. 2012    per GI in [place], clear, repeat in 5 yrs   . Diagnostic mammogram      01/27/2008. normal    Social History:   History   Social History  . Marital Status: Divorced    Spouse Name: N/A    Number of Children: 1  . Years of Education: N/A   Occupational History  . Life insurance agent    Social History Main Topics  . Smoking status: Never Smoker   . Smokeless tobacco: Never Used  . Alcohol Use: No  . Drug Use: No  . Sexual Activity: Not on file   Other Topics Concern  . Not on file   Social History Narrative   Divorced.  Son and grandson live with her.  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    Family history:   Family  History  Problem Relation Age of Onset  . Alcohol abuse Father     Died age 4  . Leukemia Brother     Died age 59  . Heart disease Father   . Aneurysm Mother     Died age 107  . COPD Sister   . Rheum arthritis Sister   . Heart failure Sister     Allergies   Cymbalta  Current Medications:   Prior to Admission medications   Medication Sig Start Date End Date Taking? Authorizing Provider  ACCU-CHEK SOFTCLIX LANCETS lancets Use as instructed 02/24/13  Yes Philemon Kingdom, MD  ALPRAZolam Duanne Moron) 1 MG tablet Take 1 tablet (1 mg total) by mouth at bedtime as needed for sleep. 03/15/13 03/15/14 Yes Laurey Morale, MD    Aspirin-Salicylamide-Caffeine [facility] HEADACHE POWDER PO) Take 1 each by mouth as needed (headaches).   Yes Historical Provider, MD  Canagliflozin 100 MG TABS Please take 1 tablet by mouth in am 06/05/13  Yes Philemon Kingdom, MD  cyclobenzaprine (FLEXERIL) 10 MG tablet Take 1 tablet (10 mg total) by mouth 3 (three) times daily as needed. 03/15/13  Yes Laurey Morale, MD  glucose blood test strip Use 3x a day - AccuCheck nano 02/24/13  Yes Philemon Kingdom, MD  Insulin Glargine (LANTUS) 100 UNIT/ML Solostar Pen Inject 12 Units into the skin at bedtime. 06/05/13  Yes Philemon Kingdom, MD  Insulin Pen Needle (CAREFINE PEN NEEDLES) 32G X 5 MM MISC Use once a day 06/05/13  Yes Philemon Kingdom, MD  losartan-hydrochlorothiazide (HYZAAR) 50-12.5 MG per tablet Take 1 tablet by mouth daily. 03/15/13  Yes Laurey Morale, MD  meclizine (ANTIVERT) 25 MG tablet Take 1 tablet (25 mg total) by mouth every 4 (four) hours as needed for dizziness. 03/15/13  Yes Laurey Morale, MD  meloxicam (MOBIC) 15 MG tablet Take 15 mg by mouth every morning.   Yes Historical Provider, MD  metFORMIN (GLUMETZA) 1000 MG (MOD) 24 hr tablet Take 1,000 mg by mouth 2 (two) times daily.   Yes Historical Provider, MD  ondansetron (ZOFRAN ODT) 4 MG disintegrating tablet Take 1 tablet (4 mg total) by mouth every 8 (eight) hours as needed for nausea. 03/15/13  Yes Laurey Morale, MD  SUMAtriptan (IMITREX) 100 MG tablet Take 100 mg by mouth as needed for migraine or headache. May repeat in 2 hours if headache persists or recurs.   Yes Historical Provider, MD  traMADol (ULTRAM) 50 MG tablet Take 2 tablets (100 mg total) by mouth every 6 (six) hours as needed. 05/11/13  Yes Laurey Morale, MD    Physical Exam:   Filed Vitals:   07/28/13 1515 07/28/13 1530 07/28/13 1554 07/28/13 1555  BP:  95/75    Pulse:  108    Temp:    97.6 F (36.4 C)  TempSrc:    Oral  Resp: 19 24    Height:   5\' 5"  (1.651 m)   Weight:   64.411 kg (142 lb)   SpO2:  100%        Physical Exam: Blood pressure 95/75, pulse 108, temperature 97.6 F (36.4 C), temperature source Oral, resp. rate 24, height 5\' 5"  (1.651 m), weight 64.411 kg (142 lb), SpO2 100.00%. Gen: Uncomfortable appearing, shivering, mildly toxic. Head: Normocephalic, atraumatic. Eyes: PERRL, EOMI, sclerae nonicteric. Mouth: Oropharynx clear with dry mucous membranes. Neck: Supple, no thyromegaly, no lymphadenopathy, no jugular venous distention. Chest: Lungs CTAB. CV: Heart sounds are tachycardic, regular, no M/R/G. Abdomen: Soft,  nontender, nondistended with normal active bowel sounds. Extremities: Extremities are without C/E/C. Skin: Warm and dry. Neuro: Alert and oriented times 3; cranial nerves II through XII grossly intact. Psych: Mood and affect anxious/depressed.   Data Review:    Labs: Basic Metabolic Panel:  Recent Labs Lab 07/28/13 1253  NA 135*  K 5.0  CL 89*  CO2 8*  GLUCOSE 421*  BUN 30*  CREATININE 0.99  CALCIUM 10.1   CBC:  Recent Labs Lab 07/28/13 1253  WBC 15.2*  HGB 17.3*  HCT 51.4*  MCV 91.6  PLT 324    Radiographic Studies: No results found.  EKG: Independently reviewed. Sinus tachycardia at 115 bpm.  Q waves V3-V6.   Assessment/Plan:   Principal Problem:   DKA (diabetic ketoacidoses) / DM (diabetes mellitus), uncontrolled, with ketoacidosis  Status post IV fluid bolus in the ER consisting of normal saline x2 liters, continue to vigorously hydrate with normal saline.  Start on insulin drip per glucose stabilizer protocol with every hour CBG checks and every 2 hour BMET checks until stable.  Supplement potassium if needed. 2 runs ordered.  Add dextrose to IV fluids when serum glucose less than 250.  Transition to basal/bolus insulin when the ketoacidosis has resolved and the patient is able to eat. Continue IV insulin infusion for one to two hours after initiating the SQ insulin, to avoid recurrent hyperglycemia.  Given one amp of  sodium bicarbonate for severe acidosis.  Active Problems:   Severe protein calorie malnutrition  Check albumin.  Dietician consult when more stable.    GERD  PPI ordered.    HYPERTENSION  Hold Losartan/HCTZ.    INSOMNIA  Continue Xanax Q HS PRN.    Fibromyalgia  Continue Mobic, PRN Tramadol.    Leukocytosis  Check CXR.  U/A/culture.    Dehydration  Vigorously hydrate.    DVT prophylaxis  Lovenox ordered.  Code Status: Full. Family Communication: Katie Palmer, son, [phone]. Disposition Plan: Home when stable.  Time spent: 70 minutes.  RAMA,CHRISTINA [facility] Pager [phone] Cell: [phone]   If 7PM-7AM, please contact night-coverage [email or web address] Password [email or web address] 07/28/2013, 4:32 PM    **Disclaimer: This note was dictated with voice recognition software. Similar sounding words can inadvertently be transcribed and this note may contain transcription errors which may not have been corrected upon publication of note.**

## 2013-07-28 NOTE — ED Notes (Signed)
Tersa from lab reports needs another urine sample due to cup "leaky" to ensure culture clean catch.

## 2013-07-28 NOTE — Progress Notes (Signed)
[facility] Progress Note Patient Name: Katie Palmer DOB: 1950-09-15 MRN: [id number]  Date of Service  07/28/2013   HPI/Events of Note  ELINK ATTENDING NOTE:  65 F admitted to [facility] with DKI.  Has PMH significant for HTN/fibromyalgia and Diabetes.  Camera check shows patient to be  HD stable, sleeping on with adequate sats on RA.     eICU Interventions  Plan: Continue insulin gtt Monitor patient   Intervention Category Intermediate Interventions: Hyperglycemia - evaluation and treatment  Taylan Marez 07/28/2013, 9:39 PM

## 2013-07-28 NOTE — ED Provider Notes (Signed)
CSN: [id number]     Arrival date & time 07/28/13  1222 History   First MD Initiated Contact with Patient 07/28/13 1258     Chief Complaint  Patient presents with  . Generalized Body Aches  . Chest Pain     (Consider location/radiation/quality/duration/timing/severity/associated sxs/prior Treatment) HPI  Katie Palmer is a 63 y.o. female who presents for complaint of myalgias, hyperglycemia, weight. This dizziness, vomiting, and burning, chest pain. Her symptoms have been progressive over several weeks. During this time she has had weight loss of greater than 10 pounds. She denies fever, chills, or back pain. She has not seen her doctor recently. She is taking her medications, as prescribed. There are no other known modifying factors.  Past Medical History  Diagnosis Date  . Hx of colonic polyps   . Depression   . Headache(784.0)   . Hypertension   . Insomnia   . Fibromyalgia   . Diabetes mellitus without complication    Past Surgical History  Procedure Laterality Date  . Pulmonary stress test      Normal on 02/04/09. Sees Dr. Philis Pique for GYN exams  . Tonsillectomy    . Colonoscopy  Feb. 2012    per GI in [place], clear, repeat in 5 yrs   . Diagnostic mammogram      01/27/2008. normal   Family History  Problem Relation Age of Onset  . Alcohol abuse    . Breast cancer      1st degree relative  . Uterine cancer    . Heart disease    . Aneurysm     History  Substance Use Topics  . Smoking status: Never Smoker   . Smokeless tobacco: Never Used  . Alcohol Use: No   OB History   Grav Para Term Preterm Abortions TAB SAB Ect Mult Living                 Review of Systems  All other systems reviewed and are negative.     Allergies  Cymbalta  Home Medications   Prior to Admission medications   Medication Sig Start Date End Date Taking? Authorizing Provider  ACCU-CHEK SOFTCLIX LANCETS lancets Use as instructed 02/24/13  Yes Philemon Kingdom, MD  ALPRAZolam  Duanne Moron) 1 MG tablet Take 1 tablet (1 mg total) by mouth at bedtime as needed for sleep. 03/15/13 03/15/14 Yes Laurey Morale, MD  Aspirin-Salicylamide-Caffeine [facility] HEADACHE POWDER PO) Take 1 each by mouth as needed (headaches).   Yes Historical Provider, MD  Canagliflozin 100 MG TABS Please take 1 tablet by mouth in am 06/05/13  Yes Philemon Kingdom, MD  cyclobenzaprine (FLEXERIL) 10 MG tablet Take 1 tablet (10 mg total) by mouth 3 (three) times daily as needed. 03/15/13  Yes Laurey Morale, MD  glucose blood test strip Use 3x a day - AccuCheck nano 02/24/13  Yes Philemon Kingdom, MD  Insulin Glargine (LANTUS) 100 UNIT/ML Solostar Pen Inject 12 Units into the skin at bedtime. 06/05/13  Yes Philemon Kingdom, MD  Insulin Pen Needle (CAREFINE PEN NEEDLES) 32G X 5 MM MISC Use once a day 06/05/13  Yes Philemon Kingdom, MD  losartan-hydrochlorothiazide (HYZAAR) 50-12.5 MG per tablet Take 1 tablet by mouth daily. 03/15/13  Yes Laurey Morale, MD  meclizine (ANTIVERT) 25 MG tablet Take 1 tablet (25 mg total) by mouth every 4 (four) hours as needed for dizziness. 03/15/13  Yes Laurey Morale, MD  meloxicam (MOBIC) 15 MG tablet Take 15 mg by mouth every morning.  Yes Historical Provider, MD  metFORMIN (GLUMETZA) 1000 MG (MOD) 24 hr tablet Take 1,000 mg by mouth 2 (two) times daily.   Yes Historical Provider, MD  ondansetron (ZOFRAN ODT) 4 MG disintegrating tablet Take 1 tablet (4 mg total) by mouth every 8 (eight) hours as needed for nausea. 03/15/13  Yes Laurey Morale, MD  SUMAtriptan (IMITREX) 100 MG tablet Take 100 mg by mouth as needed for migraine or headache. May repeat in 2 hours if headache persists or recurs.   Yes Historical Provider, MD  traMADol (ULTRAM) 50 MG tablet Take 2 tablets (100 mg total) by mouth every 6 (six) hours as needed. 05/11/13  Yes Laurey Morale, MD   BP 136/91  Pulse 118  SpO2 95% Physical Exam  Nursing note and vitals reviewed. Constitutional: She is oriented to person, place, and time.  She appears well-developed and well-nourished.  HENT:  Head: Normocephalic and atraumatic.  Eyes: Conjunctivae and EOM are normal. Pupils are equal, round, and reactive to light.  Neck: Normal range of motion and phonation normal. Neck supple.  Cardiovascular: Normal rate, regular rhythm and intact distal pulses.   Pulmonary/Chest: Effort normal and breath sounds normal. She exhibits no tenderness.  Abdominal: Soft. She exhibits no distension. There is no tenderness. There is no guarding.  Musculoskeletal: Normal range of motion.  Neurological: She is alert and oriented to person, place, and time. She exhibits normal muscle tone.  Skin: Skin is warm and dry.  Psychiatric: She has a normal mood and affect. Her behavior is normal. Judgment and thought content normal.    ED Course  Procedures (including critical care time)   Medications  0.9 %  sodium chloride infusion ( Intravenous New Bag/Given 07/28/13 1512)  dextrose 5 %-0.45 % sodium chloride infusion (not administered)  insulin regular (NOVOLIN R,HUMULIN R) 1 Units/mL in sodium chloride 0.9 % 100 mL infusion (not administered)  sodium chloride 0.9 % bolus 2,000 mL (not administered)  sodium chloride 0.9 % bolus 1,000 mL (1,000 mLs Intravenous New Bag/Given 07/28/13 1511)  HYDROmorphone (DILAUDID) injection 1 mg (1 mg Intravenous Given 07/28/13 1504)  ondansetron (ZOFRAN) injection 4 mg (4 mg Intravenous Given 07/28/13 1504)  pantoprazole (PROTONIX) injection 40 mg (40 mg Intravenous Given 07/28/13 1504)    Patient Vitals for the past 24 hrs:  BP Temp Temp src Pulse SpO2  07/28/13 1241 136/91 mmHg - Oral 118 95 %    3:36 PM Reevaluation with update and discussion. After initial assessment and treatment, an updated evaluation reveals , on trial of orthostatic vitals, patient became symptomatic and had an elevated heart rate, blood pressure, could not be obtained because of dizziness. The patient feels somewhat better, at this time after  initiation of treatment. Kiam Bransfield L    3:47 PM-Consult complete with Dr. Rockne Menghini. Patient case explained and discussed. She agrees to admit patient for further evaluation and treatment. Call ended at 15:50  CRITICAL CARE Performed by: Richarda Blade Total critical care time: 40 min Critical care time was exclusive of separately billable procedures and treating other patients. Critical care was necessary to treat or prevent imminent or life-threatening deterioration. Critical care was time spent personally by me on the following activities: development of treatment plan with patient and/or surrogate as well as nursing, discussions with consultants, evaluation of patient's response to treatment, examination of patient, obtaining history from patient or surrogate, ordering and performing treatments and interventions, ordering and review of laboratory studies, ordering and review of radiographic studies, pulse oximetry  and re-evaluation of patient's condition.   Labs Review Labs Reviewed  CBC - Abnormal; Notable for the following:    WBC 15.2 (*)    RBC 5.61 (*)    Hemoglobin 17.3 (*)    HCT 51.4 (*)    All other components within normal limits  BASIC METABOLIC PANEL - Abnormal; Notable for the following:    Sodium 135 (*)    Chloride 89 (*)    CO2 8 (*)    Glucose, Bld 421 (*)    BUN 30 (*)    GFR calc non Af Amer 59 (*)    GFR calc Af Amer 68 (*)    Anion gap 38 (*)    All other components within normal limits  I-STAT TROPOININ, ED    Imaging Review No results found.   EKG Interpretation   Date/Time:  Friday July 28 2013 15:00:37 EDT Ventricular Rate:  118 PR Interval:  155 QRS Duration: 80 QT Interval:  356 QTC Calculation: 499 R Axis:   -71 Text Interpretation:  Sinus tachycardia Biatrial enlargement Inferior  infarct, old Anterior infarct, old since last tracing no significant  change Confirmed by [facility]  MD, Jashay Roddy [phone]) on 07/28/2013 3:33:00 PM       .   EKG Interpretation  Date/Time:  Friday July 28 2013 15:00:37 EDT Ventricular Rate:  118 PR Interval:  155 QRS Duration: 80 QT Interval:  356 QTC Calculation: 499 R Axis:   -71 Text Interpretation:  Sinus tachycardia Biatrial enlargement Inferior infarct, old Anterior infarct, old since last tracing no significant change Confirmed by [facility]  MD, Curtina Grills ([id number]) on 07/28/2013 3:33:00 PM        MDM   Final diagnoses:  Diabetic ketoacidosis associated with other specified diabetes mellitus  Dehydration  Myalgia    Evaluation is consistent with DKA, cause unclear. Patient is type II diabetic. She has a prolonged progression of symptoms. She'll need to be admitted for stabilization, and treatment.  Nursing Notes Reviewed/ Care Coordinated, and agree without changes. Applicable Imaging Reviewed.  Interpretation of Laboratory Data incorporated into ED treatment  Plan: Admit    Richarda Blade, MD 07/28/13 [phone]

## 2013-07-28 NOTE — ED Notes (Addendum)
Pt stood and HR went to 120-130. Pt stated that she felt faint. Pt returned to bed and is now resting quietly. Pt is A&O and in NAD. Dr. Eulis Foster notified

## 2013-07-28 NOTE — ED Notes (Signed)
Critical called-CO2= 8  Called by [employer], MD notified at [phone]

## 2013-07-28 NOTE — Progress Notes (Signed)
CRITICAL VALUE ALERT  Critical value received:  C02 7  Date of notification:  7/3    Time of notification: 1920  Critical value read back:Yes.    Nurse who received alert:  Manuela Neptune   MD notified (1st page):  Kathline Magic NP [facility]  Time of first page:  1922

## 2013-07-28 NOTE — ED Notes (Signed)
Pharmacy sent Imitrex but one pill pack had been opened and empty. Lyndee Leo in pharmacy notified and stated that she will send the correct dose to ICU. Returning meds back to pharmacy the way that they were received

## 2013-07-28 NOTE — ED Notes (Signed)
Notified Dr. Eulis Foster that pt is c/o chest pressure since yesterday and emesis that started today

## 2013-07-28 NOTE — ED Notes (Addendum)
Pt from home c/o leg cramps, generalized body aches x 1week and CP that started yesterday but described as pressure. Pt denies SOB, but adds that she has had emesis several times today and is unable to keep fluid down. Pt denies urinary s/sx but states she had frequency. Pt is A&O and in NAD. Pt adds that she has leg weakness and has been unable to sleep for several days

## 2013-07-29 DIAGNOSIS — E860 Dehydration: Secondary | ICD-10-CM

## 2013-07-29 DIAGNOSIS — K219 Gastro-esophageal reflux disease without esophagitis: Secondary | ICD-10-CM

## 2013-07-29 LAB — GLUCOSE, CAPILLARY
GLUCOSE-CAPILLARY: 156 mg/dL — AB (ref 70–99)
GLUCOSE-CAPILLARY: 146 mg/dL — AB (ref 70–99)
GLUCOSE-CAPILLARY: 154 mg/dL — AB (ref 70–99)
GLUCOSE-CAPILLARY: 136 mg/dL — AB (ref 70–99)
GLUCOSE-CAPILLARY: 165 mg/dL — AB (ref 70–99)
GLUCOSE-CAPILLARY: 97 mg/dL (ref 70–99)
Glucose-Capillary: 336 mg/dL — ABNORMAL HIGH (ref 70–99)
Glucose-Capillary: 108 mg/dL — ABNORMAL HIGH (ref 70–99)
Glucose-Capillary: 144 mg/dL — ABNORMAL HIGH (ref 70–99)
Glucose-Capillary: 150 mg/dL — ABNORMAL HIGH (ref 70–99)
Glucose-Capillary: 152 mg/dL — ABNORMAL HIGH (ref 70–99)
Glucose-Capillary: 284 mg/dL — ABNORMAL HIGH (ref 70–99)

## 2013-07-29 LAB — BASIC METABOLIC PANEL
Anion gap: 19 — ABNORMAL HIGH (ref 5–15)
Anion gap: 17 — ABNORMAL HIGH (ref 5–15)
Anion gap: 16 — ABNORMAL HIGH (ref 5–15)
Anion gap: 14 (ref 5–15)
Anion gap: 17 — ABNORMAL HIGH (ref 5–15)
BUN: 20 mg/dL (ref 6–23)
BUN: 18 mg/dL (ref 6–23)
BUN: 16 mg/dL (ref 6–23)
BUN: 14 mg/dL (ref 6–23)
BUN: 17 mg/dL (ref 6–23)
CALCIUM: 8.4 mg/dL (ref 8.4–10.5)
CALCIUM: 8 mg/dL — AB (ref 8.4–10.5)
CALCIUM: 8.3 mg/dL — AB (ref 8.4–10.5)
CHLORIDE: 105 meq/L (ref 96–112)
CO2: 15 mEq/L — ABNORMAL LOW (ref 19–32)
CO2: 16 mEq/L — ABNORMAL LOW (ref 19–32)
CO2: 17 mEq/L — ABNORMAL LOW (ref 19–32)
CO2: 20 mEq/L (ref 19–32)
CO2: 17 mEq/L — ABNORMAL LOW (ref 19–32)
CREATININE: 0.59 mg/dL (ref 0.50–1.10)
CREATININE: 0.63 mg/dL (ref 0.50–1.10)
CREATININE: 0.68 mg/dL (ref 0.50–1.10)
Calcium: 8.2 mg/dL — ABNORMAL LOW (ref 8.4–10.5)
Calcium: 8.6 mg/dL (ref 8.4–10.5)
Chloride: 104 mEq/L (ref 96–112)
Chloride: 104 mEq/L (ref 96–112)
Chloride: 104 mEq/L (ref 96–112)
Chloride: 103 mEq/L (ref 96–112)
Creatinine, Ser: 0.65 mg/dL (ref 0.50–1.10)
Creatinine, Ser: 0.61 mg/dL (ref 0.50–1.10)
GFR calc non Af Amer: >90 mL/min (ref >90)
GFR calc non Af Amer: >90 mL/min (ref >90)
GFR calc non Af Amer: >90 mL/min (ref >90)
GLUCOSE: 181 mg/dL — AB (ref 70–99)
Glucose, Bld: 118 mg/dL — ABNORMAL HIGH (ref 70–99)
Glucose, Bld: 160 mg/dL — ABNORMAL HIGH (ref 70–99)
Glucose, Bld: 172 mg/dL — ABNORMAL HIGH (ref 70–99)
Glucose, Bld: 190 mg/dL — ABNORMAL HIGH (ref 70–99)
POTASSIUM: 3.9 meq/L (ref 3.7–5.3)
POTASSIUM: 3.9 meq/L (ref 3.7–5.3)
Potassium: 3.7 mEq/L (ref 3.7–5.3)
Potassium: 3.8 mEq/L (ref 3.7–5.3)
Potassium: 4.4 mEq/L (ref 3.7–5.3)
SODIUM: 137 meq/L (ref 137–147)
Sodium: 138 mEq/L (ref 137–147)
Sodium: 137 mEq/L (ref 137–147)
Sodium: 137 mEq/L (ref 137–147)
Sodium: 139 mEq/L (ref 137–147)

## 2013-07-29 LAB — URINALYSIS, ROUTINE W REFLEX MICROSCOPIC
Bilirubin Urine: NEGATIVE
Glucose, UA: >1000 mg/dL — AB
Hgb urine dipstick: NEGATIVE
Leukocytes, UA: NEGATIVE
NITRITE: NEGATIVE
Protein, ur: NEGATIVE mg/dL
Specific Gravity, Urine: 1.027 (ref 1.005–1.030)
UROBILINOGEN UA: 0.2 mg/dL (ref 0.0–1.0)
pH: 5.5 (ref 5.0–8.0)

## 2013-07-29 LAB — URINE MICROSCOPIC-ADD ON

## 2013-07-29 MED ORDER — INSULIN GLARGINE 100 UNIT/ML ~~LOC~~ SOLN
5.0000 [IU] | Freq: Every day | SUBCUTANEOUS | Status: DC
Start: 2013-07-29 — End: 2013-07-30
  Administered 2013-07-29 – 2013-07-30 (×2): 5 [IU] via SUBCUTANEOUS
  Filled 2013-07-29 (×2): qty 0.05

## 2013-07-29 MED ORDER — INSULIN ASPART 100 UNIT/ML ~~LOC~~ SOLN
3.0000 [IU] | Freq: Three times a day (TID) | SUBCUTANEOUS | Status: DC
  Administered 2013-07-29 – 2013-07-30 (×3): 3 [IU] via SUBCUTANEOUS

## 2013-07-29 MED ORDER — INSULIN ASPART 100 UNIT/ML ~~LOC~~ SOLN
0.0000 [IU] | Freq: Every day | SUBCUTANEOUS | Status: DC
  Administered 2013-07-29: 3 [IU] via SUBCUTANEOUS

## 2013-07-29 MED ORDER — PANTOPRAZOLE SODIUM 40 MG PO TBEC
40.0000 mg | DELAYED_RELEASE_TABLET | Freq: Every day | ORAL | Status: DC
  Administered 2013-07-30: 40 mg via ORAL
  Filled 2013-07-29: qty 1

## 2013-07-29 MED ORDER — INSULIN ASPART 100 UNIT/ML ~~LOC~~ SOLN
0.0000 [IU] | Freq: Three times a day (TID) | SUBCUTANEOUS | Status: DC
  Administered 2013-07-29: 2 [IU] via SUBCUTANEOUS
  Administered 2013-07-30 (×2): 1 [IU] via SUBCUTANEOUS

## 2013-07-29 MED ORDER — SODIUM CHLORIDE 0.9 % IV SOLN
INTRAVENOUS | Status: DC
  Administered 2013-07-29 – 2013-07-30 (×2): via INTRAVENOUS

## 2013-07-29 MED ORDER — HYDROCORTISONE 1 % EX LOTN
TOPICAL_LOTION | Freq: Three times a day (TID) | CUTANEOUS | Status: DC
  Administered 2013-07-29 – 2013-07-30 (×3): via TOPICAL
  Filled 2013-07-29: qty 118

## 2013-07-29 NOTE — Progress Notes (Signed)
Progress Note   Katie Palmer XKG:[id number] DOB: 04/25/1950 DOA: 07/28/2013 PCP: Laurey Morale, MD   Brief Narrative:   Katie Palmer is an 63 y.o. female with a PMH of HTN, uncontrolled DM (last hemoglobin A1c 10.4% 06/05/13), who was admitted 07/28/13 with a one week history of insomnia accompanied by leg cramps, difficulty concentrating, fatigue, myalgias, and over the past 48 hours, chest pain/indigestion (burning sensation relieved by vomiting), palpitations, but no SOB, cough. Upon initial valuation in the ED, the patient was found to be in DKA with a bicarbonate of 8, a glucose of 421, and an anion gap of 38.   Assessment/Plan:   Principal Problem:  DKA (diabetic ketoacidoses) / DM (diabetes mellitus), uncontrolled, with ketoacidosis  Status post IV fluid bolus in the ER consisting of normal saline x2 liters, continue to vigorously hydrate with D5 and half-normal saline.  Continue insulin drip per glucose stabilizer protocol. Anion gap now closed (14). Current insulin drip at 0.3 units per hour. Given the relatively low insulin requirements, suspect she may be a type I diabetic. Check insulin and will attempt acid decarboxylase autoantibodies. Transition to basal/bolus insulin and advance diet to carbohydrate modified. Continue IV insulin infusion for one to two hours after initiating the SQ insulin, to avoid recurrent hyperglycemia.  Diabetes coordinator consultation and dietitian consultation requested. Transfer to floor when off insulin drip.  Active Problems:  Severe protein calorie malnutrition  Albumin 4.6. Dietician consultation requested.  GERD  PPI ordered.  HYPERTENSION  Hold Losartan/HCTZ. Blood pressure soft.  INSOMNIA  Continue Xanax Q HS PRN.  Fibromyalgia  Continue Mobic, PRN Tramadol.  Leukocytosis  Chest x-ray clear.  Suspect elevation from hemoconcentration.  Dehydration  Hydrated.  DVT prophylaxis  Lovenox ordered.  Code Status: Full.  Family  Communication: No family at the bedside. [name], son, [phone], is emergency contact.  Disposition Plan: Home when stable.    IV Access:    Peripheral IV   Procedures:    Chest x-ray as noted below.   Medical Consultants:    None.   Other Consultants:    Diabetes coordinator.  Dietitian.   Anti-Infectives:    None.  Subjective:   Katie Palmer is feeling much better today. Her reflux symptoms are gone and she is not having any further nausea or vomiting. No complaints of pain. No dyspnea.  Objective:    Filed Vitals:   07/29/13 0000 07/29/13 0400 07/29/13 0430 07/29/13 0600  BP: 93/54  91/60 85/51  Pulse: 92  80 81  Temp: 98.1 F (36.7 C) 97.7 F (36.5 C)    TempSrc: Oral Oral    Resp: 18  18 16   Height:      Weight:      SpO2: 98%  93% 98%    Intake/Output Summary (Last 24 hours) at 07/29/13 0816 Last data filed at 07/29/13 0500  Gross per 24 hour  Intake 108.24 ml  Output   1200 ml  Net -1091.76 ml    Exam: Gen:  NAD Cardiovascular:  RRR, No M/R/G Respiratory:  Lungs CTAB Gastrointestinal:  Abdomen soft, NT/ND, + BS Extremities:  No C/E/C   Data Reviewed:    Labs: Basic Metabolic Panel:  Recent Labs Lab 07/28/13 2225 07/29/13 0030 07/29/13 0230 07/29/13 0347 07/29/13 0627  NA 141 138 137 137 139  K 4.2 3.9 3.7 3.8 4.4  CL 108 104 104 104 105  CO2 13* 15* 16* 17* 20  GLUCOSE 78 118* 160* 181* 172*  BUN 22 20 18 17 16   CREATININE 0.64 0.65 0.59 0.63 0.68  CALCIUM 8.2* 8.3* 8.0* 8.2* 8.6   GFR Estimated Creatinine Clearance: 69.5 ml/min (by C-G formula based on Cr of 0.68). Liver Function Tests:  Recent Labs Lab 07/28/13 1253  AST 16  ALT 23  ALKPHOS 103  BILITOT 0.3  PROT 8.5*  ALBUMIN 4.6   CBC:  Recent Labs Lab 07/28/13 1253  WBC 15.2*  HGB 17.3*  HCT 51.4*  MCV 91.6  PLT 324   CBG:  Recent Labs Lab 07/28/13 1844 07/28/13 1948 07/28/13 2119 07/28/13 2227 07/28/13 2332  GLUCAP 271*  234* 137* 90 44   Microbiology Recent Results (from the past 240 hour(s))  MRSA PCR SCREENING     Status: None   Collection Time    07/28/13  5:52 PM      Result Value Ref Range Status   MRSA by PCR NEGATIVE  NEGATIVE Final   Comment:            The GeneXpert MRSA Assay (FDA     approved for NASAL specimens     only), is one component of a     comprehensive MRSA colonization     surveillance program. It is not     intended to diagnose MRSA     infection nor to guide or     monitor treatment for     MRSA infections.     Radiographs/Studies:   Dg Chest 2 View  07/28/2013   CLINICAL DATA:  Chest pain, vomiting  EXAM: CHEST  2 VIEW  COMPARISON:  None.  FINDINGS: Lungs are clear.  No pleural effusion or pneumothorax.  The heart is normal in size.  Degenerative changes of the visualized thoracolumbar spine.  IMPRESSION: No evidence of acute cardiopulmonary disease.   Electronically Signed   By: Julian Hy M.D.   On: 07/28/2013 17:13    Medications:   . enoxaparin (LOVENOX) injection  40 mg Subcutaneous Q24H  . meloxicam  15 mg Oral q morning - 10a  . pantoprazole (PROTONIX) IV  40 mg Intravenous Daily   Continuous Infusions: . sodium chloride 200 mL/hr at 07/28/13 1802  . dextrose 5 % and 0.45% NaCl 125 mL/hr at 07/28/13 2009  . insulin (NOVOLIN-R) infusion 0.3 Units/hr (07/29/13 0500)    Time spent: 35 minutes with > 50% of time discussing current diagnostic test results, clinical impression and plan of care.    LOS: 1 day   [name]  [facility] Pager [phone]. If unable to reach me by pager, please call my cell phone at [phone].  *Please refer to [email or web address], password [email or web address] to get updated schedule on who will round on this patient, as hospitalists switch teams weekly. If 7PM-7AM, please contact night-coverage at [email or web address], password [email or web address] for any overnight needs.  07/29/2013, 8:16 AM    **Disclaimer: This note was dictated with voice recognition  software. Similar sounding words can inadvertently be transcribed and this note may contain transcription errors which may not have been corrected upon publication of note.**

## 2013-07-30 LAB — LIPID PANEL
CHOL/HDL RATIO: 3 ratio
Cholesterol: 189 mg/dL (ref 0–200)
HDL: 64 mg/dL (ref >39)
LDL CALC: 102 mg/dL — AB (ref 0–99)
Triglycerides: 117 mg/dL (ref <150)
VLDL: 23 mg/dL (ref 0–40)

## 2013-07-30 LAB — CBC
HEMATOCRIT: 36.9 % (ref 36.0–46.0)
Hemoglobin: 12.6 g/dL (ref 12.0–15.0)
MCH: 30.2 pg (ref 26.0–34.0)
MCHC: 34.1 g/dL (ref 30.0–36.0)
MCV: 88.5 fL (ref 78.0–100.0)
Platelets: 157 10*3/uL (ref 150–400)
RBC: 4.17 MIL/uL (ref 3.87–5.11)
RDW: 14.2 % (ref 11.5–15.5)
WBC: 8.2 10*3/uL (ref 4.0–10.5)

## 2013-07-30 LAB — GLUCOSE, CAPILLARY
Glucose-Capillary: 135 mg/dL — ABNORMAL HIGH (ref 70–99)
Glucose-Capillary: 128 mg/dL — ABNORMAL HIGH (ref 70–99)

## 2013-07-30 LAB — BASIC METABOLIC PANEL
Anion gap: 14 (ref 5–15)
BUN: 15 mg/dL (ref 6–23)
CALCIUM: 8.4 mg/dL (ref 8.4–10.5)
CHLORIDE: 107 meq/L (ref 96–112)
CO2: 20 meq/L (ref 19–32)
CREATININE: 0.55 mg/dL (ref 0.50–1.10)
GFR calc Af Amer: >90 mL/min (ref >90)
GFR calc non Af Amer: >90 mL/min (ref >90)
GLUCOSE: 153 mg/dL — AB (ref 70–99)
Potassium: 3.8 mEq/L (ref 3.7–5.3)
SODIUM: 141 meq/L (ref 137–147)

## 2013-07-30 MED ORDER — INSULIN ASPART 100 UNIT/ML ~~LOC~~ SOLN: SUBCUTANEOUS | Status: DC

## 2013-07-30 MED ORDER — LIVING WELL WITH DIABETES BOOK
Freq: Once | Status: AC
  Administered 2013-07-30: 10:00:00
  Filled 2013-07-30: qty 1

## 2013-07-30 MED ORDER — INSULIN GLARGINE 100 UNIT/ML SOLOSTAR PEN: 6.0000 [IU] | PEN_INJECTOR | Freq: Every day | SUBCUTANEOUS | Status: DC

## 2013-07-30 NOTE — Progress Notes (Signed)
INITIAL NUTRITION ASSESSMENT  DOCUMENTATION CODES Per approved criteria  -Non-severe (moderate) malnutrition in the context of chronic illness   INTERVENTION: Reviewed CHO counting with patient and basics of meal planning (consistent CHO, protein with each meal, low fat, high fiber, low CHO snacks).  Teach back method used.  "Plate Method" handout and "CHO Counting for People with Diabetes" provided with contact information.Marland Kitchen  NUTRITION DIAGNOSIS: Food and nutrition related knowledge deficit related diabetes AEB patient report.  Goal: Intake of meals to meet >90% estimated needs. Monitor:  Patient to be d/c'ed Reason for Assessment: Consult to assess status and needs.  62 y.o. female  Admitting Dx: DKA (diabetic ketoacidoses)  ASSESSMENT: Patient admitted with uncontrolled DM with ketoacidosis. HgbAiC of 10.4 06/05/13.    Patient reports Dx of Dm in November with weight loss from 240 lbs in September 2014.  Patient stated that she began walking 2-3 miles per day, having more balanced meals and drinking only water.  Began having a fruity smell to breath.    Patient with a 35% weight loss in the past 9 months.  Eating well until 9 months ago.    Patient meets criteria for mild/moderate malnutrition related to chronic illness AEB 35% weight loss in the past 9 months, mild depletion of muscle mass.  Nutrition Focused Physical Exam:  Subcutaneous Fat:  Orbital Region: wnl Upper Arm Region: wnl Thoracic and Lumbar Region: wnl Muscle:  Temple Region: mild Clavicle Bone Region: wnl Clavicle and Acromion Bone Region: mild Scapular Bone Region: wnl Dorsal Hand: wnl Patellar Region: wnl Anterior Thigh Region: wnl Posterior Calf Region: mild  Edema: possitive    Height: Ht Readings from Last 1 Encounters:  07/29/13 5\' 5"  (1.651 m)    Weight: Wt Readings from Last 1 Encounters:  07/29/13 156 lb 12 oz (71.1 kg)    Ideal Body Weight: 125 lbs  % Ideal Body Weight:  125  Wt Readings from Last 10 Encounters:  07/29/13 156 lb 12 oz (71.1 kg)  06/05/13 166 lb (75.297 kg)  04/14/13 179 lb (81.194 kg)  03/15/13 180 lb (81.647 kg)  02/28/13 178 lb 8 oz (80.967 kg)  02/24/13 176 lb (79.833 kg)  11/29/12 196 lb (88.905 kg)  12/30/11 227 lb (102.967 kg)  05/27/11 237 lb (107.502 kg)  12/22/10 241 lb (109.317 kg)    Usual Body Weight: 180 lbs 03/15/13  % Usual Body Weight: 87  BMI:  Body mass index is 26.08 kg/(m^2).  Estimated Nutritional Needs: Kcal: 1700-1800 Protein: 55-65 gm Fluid: 1.7-1.8L daily  Skin: intact  Diet Order: Carb Control  EDUCATION NEEDS: -Education needs addressed   Intake/Output Summary (Last 24 hours) at 07/30/13 1233 Last data filed at 07/30/13 0457  Gross per 24 hour  Intake    240 ml  Output   1350 ml  Net  -1110 ml    Labs:   Recent Labs Lab 07/29/13 0627 07/29/13 0830 07/30/13 0408  NA 139 137 141  K 4.4 3.9 3.8  CL 105 103 107  CO2 20 17* 20  BUN 16 14 15   CREATININE 0.68 0.61 0.55  CALCIUM 8.6 8.4 8.4  GLUCOSE 172* 190* 153*    CBG (last 3)   Recent Labs  07/29/13 2121 07/30/13 0749 07/30/13 1216  GLUCAP 284* 135* 128*    Scheduled Meds: . enoxaparin (LOVENOX) injection  40 mg Subcutaneous Q24H  . hydrocortisone   Topical TID  . insulin aspart  0-5 Units Subcutaneous QHS  . insulin aspart  0-9  Units Subcutaneous TID WC  . insulin aspart  3 Units Subcutaneous TID WC  . insulin glargine  5 Units Subcutaneous Daily  . living well with diabetes book   Does not apply Once  . meloxicam  15 mg Oral q morning - 10a  . pantoprazole  40 mg Oral Daily    Continuous Infusions: . sodium chloride 10 mL/hr at 07/30/13 5035    Past Medical History  Diagnosis Date  . Hx of colonic polyps   . Depression   . Headache(784.0)   . Hypertension   . Insomnia   . Fibromyalgia   . Diabetes mellitus without complication   . Vertigo     Past Surgical History  Procedure Laterality Date  .  Pulmonary stress test      Normal on 02/04/09. Sees Dr. Philis Pique for GYN exams  . Tonsillectomy    . Colonoscopy  Feb. 2012    per GI in [place], clear, repeat in 5 yrs   . Diagnostic mammogram      01/27/2008. normal    Antonieta Iba, RD, LDN Clinical Inpatient Dietitian Pager:  [phone] Weekend and after hours pager:  [phone]

## 2013-07-30 NOTE — Progress Notes (Signed)
Patient was stable at time of discharge. IV removed. I reviewed discharge education with patient. We reviewed her insulin sliding scale and lantus regimen. Patient stated she is following up with Benjiman Core MD tomorrow. I gave patient her prescriptions. We also reviewed hypoglycemia symptoms and interventions. Patient verbalized understanding of all discharge education. Patient seen by diabetic coordinator and dietician before she prepared to leave.

## 2013-07-30 NOTE — Discharge Instructions (Signed)
Diabetic Ketoacidosis °Diabetic ketoacidosis (DKA) is a life-threatening complication of type 1 diabetes. It must be quickly recognized and treated. Treatment requires hospitalization. °CAUSES  °When there is no insulin in the body, glucose (sugar) cannot be used, and the body breaks down fat for energy. When fat breaks down, acids (ketones) build up in the blood. Very high levels of glucose and high levels of acids lead to severe loss of body fluids (dehydration) and other dangerous chemical changes. This stresses your vital organs and can cause coma or death. °SIGNS AND SYMPTOMS  °· Tiredness (fatigue). °· Weight loss. °· Excessive thirst. °· Ketones in your urine. °· Light-headedness. °· Fruity or sweet smelling breath. °· Excessive urination. °· Visual changes. °· Confusion or irritability. °· Nausea or vomiting. °· Rapid breathing. °· Stomachache or abdominal pain. °DIAGNOSIS  °Your health care provider will diagnose DKA based on your history, physical exam, and blood tests. The health care provider will check to see if you have another illness that caused you to go into DKA. Most of this will be done quickly in an emergency room. °TREATMENT  °· Fluid replacement to correct dehydration. °· Insulin. °· Correction of electrolytes, such as potassium and sodium. °· Antibiotic medicines. °PREVENTION °· Always take your insulin. Do not skip your insulin injections. °· If you are sick, treat yourself quickly. Your body often needs more insulin to fight the illness. °· Check your blood glucose regularly. °· Check urine ketones if your blood glucose is greater than 240 milligrams per deciliter (mg/dL). °· Do not use outdated (expired) insulin. °· If your blood glucose is high, drink plenty of fluids. This helps flush out ketones. °HOME CARE INSTRUCTIONS  °· If you are sick, follow the advice of your health care provider. °· To prevent dehydration, drink enough water and fluids to keep your urine clear or pale  yellow. °¨ If you cannot eat, alternate between drinking fluids with sugar (soda, juices, flavored gelatin) and salty fluids (broth, bouillon). °¨ If you can eat, follow your usual diet and drink sugar-free liquids (water, diet drinks). °· Always take your usual dose of insulin. If you cannot eat or if your glucose is getting too low, call your health care provider for further instructions. °· Continue to monitor your blood or urine ketones every 3-4 hours around the clock. Set your alarm clock or have someone wake you up. If you are too sick, have someone test it for you. °· Rest and avoid exercise. °SEEK MEDICAL CARE IF:  °· You have a fever. °· You have ketones in your urine, or your blood glucose is higher than a level your health care provider suggests. You may need extra insulin. Call your health care provider if you need advice on adjusting your insulin. °· You cannot drink at least a tablespoon (15 mL) of fluid every 15-20 minutes. °· You have been vomiting for more than 2 hours. °· You have symptoms of DKA: °¨ Fruity smelling breath. °¨ Breathing faster or slower. °¨ Becoming very sleepy. °SEEK IMMEDIATE MEDICAL CARE IF:  °· You have signs of dehydration: °¨ Decreased urination. °¨ Increased thirst. °¨ Dry skin and mouth. °¨ Light-headedness. °· Your blood glucose is very high (as advised by your health care provider) twice in a row. °· You faint. °· You have chest pain or trouble breathing. °· You have a sudden, severe headache. °· You have sudden weakness in one arm or one leg. °· You have sudden trouble speaking or swallowing. °· You   have vomiting or diarrhea that is getting worse after 3 hours.  You have abdominal pain. MAKE SURE YOU:   Understand these instructions.  Will watch your condition.  Will get help right away if you are not doing well or get worse. Document Released: 01/10/2000 Document Revised: 01/17/2013 Document Reviewed: 07/18/2008 [facility] [employer] Patient Information 2015 [place],  [place]. This information is not intended to replace advice given to you by your health care provider. Make sure you discuss any questions you have with your health care provider.  Insulin Treatment for Diabetes Diabetes is a disease that does not go away (chronic). It occurs when the body does not properly use the sugar (glucose) that is released from food after it is digested. Glucose levels are controlled by a hormone called insulin, which is made by your pancreas. Depending on the type of diabetes you have, either of the following will apply:   The pancreas does not make any insulin (type 1 diabetes).  The pancreas makes too little insulin, and the body cannot respond normally to the insulin that is made (type 2 diabetes). Without insulin, death can occur. However, with the addition of insulin, blood sugar monitoring, and treatment, someone with diabetes can live a full and productive life. This document will discuss the role of insulin in your treatment and provide information about its use.  HOW IS INSULIN GIVEN? Insulin is a medicine that can only be given by injection. Taking it by mouth makes it inactive because of the acid in your stomach. Insulin is injected under the skin by a syringe and needle, an insulin pen, a pump, or a jet injector. Your dose will be determined by your health care provider based on your individual needs. You will also be given guidance on which method of giving insulin is right for you. Remember that if you give insulin with a needle and syringe, you must do so using only a special insulin syringe made for this purpose. WHERE ON THE BODY SHOULD INSULIN BE INJECTED? Insulin is injected into the fatty layer of tissue just under your skin. Good places to inject insulin include the upper arm, the front and outer area of the thigh, the hips, and the abdomen. Giving your insulin in the abdomen is preferred because this provides the most rapid and consistent absorption. Avoid the  area 2 inches (5 cm) around the navel and avoid injecting into areas on your body with scar tissue. In addition, it is important to rotate your injection sites with every shot to prevent irritation and improve absorption. WHAT ARE THE DIFFERENT TYPES OF INSULIN?  If you have type 1 diabetes, you must take insulin to stay alive. Your body does not produce it. If you have type 2 diabetes, you might require insulin in addition to, or instead of, other medicines. In either case, proper use of insulin is critical to control your diabetes.  There are a number of different types of insulin. Usually, you will give yourself injections, though others can be trained to give them to you. Some people have an insulin pump that delivers insulin continuously through a tube (cannula) that is placed under the skin. Using insulin requires that you check your blood sugar several times a day. The exact number of times and time of day to check will vary depending on your type of diabetes, your type of insulin, and treatment goals. Your health care provider will direct you.  Generally, different insulins have different properties. The following is a general  guide. Specifics will vary by product, and new products are introduced periodically.   Rapid-acting insulin starts working quickly (in as little as 5 minutes) and wears off in 4 to 6 hours (sometimes longer). This type of insulin works well when taken just before a meal to bring your blood sugar quickly back to normal.   Short-acting insulin starts working in about 30 minutes and can last 6 to 10 hours. This type of insulin should be taken about 30 minutes before you start eating a meal.  Intermediate-acting insulin starts working in 1-2 hours and wears off after about 10 to 18 hours. This insulin will lower your blood sugar for a longer period of time, but it will not be as effective in lowering your blood sugar right after a meal.   Long-acting insulin mimics the  small amount of insulin that your pancreas usually produces throughout the day. You need to have some insulin present at all times. It is crucial to the metabolism of brain cells and other cells. Long-acting insulin is meant to be used either once or twice a day. It is usually used in combination with other types of insulin, or in combination with other diabetes medicines.  Discuss the type of insulin you are taking with your health care provider or pharmacist. You will then be aware of when the insulin can be expected to peak and when it will wear off. This is important to know so you can plan for meal times and periods of exercise.  Your health care provider will usually have a strategy in mind when treating you with insulin. This will vary with your type of diabetes, your diabetes treatment goals, and your health history. It is important that you understand this strategy so you can be an active partner in treating your diabetes. Here are some terms you might hear:   Basal insulin. This refers to the small amount of insulin that needs to be present in your blood at all times. Sometimes oral medicines will be enough. For other people, and especially for people with type 1 diabetes, insulin is needed. Usually, intermediate-acting or long-acting insulin is used once or twice a day to accomplish this.   Prandial (meal-related) insulin. Your blood sugar will rise rapidly after a meal. Rapid-acting or short-acting insulin can be used right before the meal to bring your blood sugar back to normal quickly. You might be instructed to adjust the amount of insulin depending on how much carbohydrate (starch) is in your meal.   Corrective insulin. You might be instructed to check your blood sugar at certain times of the day. You then might use a small amount of rapid-acting or short-acting insulin to bring the blood sugar down to normal if it is elevated.   Tight control (also called intensive therapy). Tight  control means keeping your blood sugar as close to your target as possible and keeping it from going too high after meals. People with tight control of their diabetes are shown to have fewer long-term complications from their diabetes.   Glycohemoglobin (also called glyco, glycosylated hemoglobin, hemoglobin A1c, or A1c) level. This measures how well your blood sugar has been controlled during the past 1 to 3 months. It helps your health care provider see how effective your treatment is and decide if any changes are needed. Your health care provider will discuss your target glycohemoglobin level with you.  Insulin treatment requires your careful attention. Treatment plans will be different for different people. Some people  do well with a simple program. Others require more complicated programs, with multiple insulin injections daily. You will work with your health care provider to develop the best program for you. Regardless of your insulin treatment plan, you must also do your best on weight control, diet and food choices, exercise, blood pressure control, cholesterol control, and stress levels.  WHAT ARE THE SIDE EFFECTS OF INSULIN? Although insulin treatment is important, it does have some side effects, such as:   Insulin can cause your blood sugar to go too low (hypoglycemia).   Weight gain can occur.   Improper injection technique can cause hypoglycemia, blood sugar going too high (hyperglycemia), skin injury or irritation, or other problems. You must learn to inject insulin properly. Document Released: 04/10/2008 Document Revised: 09/14/2012 Document Reviewed: 06/26/2012 [facility] Patient Information 2015 [place], [place]. This information is not intended to replace advice given to you by your health care provider. Make sure you discuss any questions you have with your health care provider.

## 2013-07-30 NOTE — Progress Notes (Signed)
Inpatient Diabetes Program Recommendations  AACE/ADA: New Consensus Statement on Inpatient Glycemic Control (2013)  Target Ranges:  Prepandial:   less than 140 mg/dL      Peak postprandial:   less than 180 mg/dL (1-2 hours)      Critically ill patients:  140 - 180 mg/dL   Reason for Visit: Diabetes Consult  Diabetes history: DM2 Outpatient Diabetes medications: Lantus 12 units QHS, Invokana 100 QAM, metformin 1000 mg bid Current orders for Inpatient glycemic control: Lantus 5 units QAM, Novolog 3 units tidwc + sensitive tidwc and hs  Results for ALONNA, BARTLING (MRN [id number]) as of 07/30/2013 13:45  Ref. Range 07/29/2013 11:54 07/29/2013 17:35 07/29/2013 21:21 07/30/2013 07:49 07/30/2013 12:16  Glucose-Capillary Latest Range: 70-99 mg/dL 152 (H) 97 284 (H) 135 (H) 128 (H)   Results for DONDREA, CLENDENIN (MRN [id number]) as of 07/30/2013 13:45  Ref. Range 07/30/2013 04:08  Sodium Latest Range: 137-147 mEq/L 141  Potassium Latest Range: 3.7-5.3 mEq/L 3.8  Chloride Latest Range: 96-112 mEq/L 107  CO2 Latest Range: 19-32 mEq/L 20  BUN Latest Range: 6-23 mg/dL 15  Creatinine Latest Range: 0.50-1.10 mg/dL 0.55  Calcium Latest Range: 8.4-10.5 mg/dL 8.4  GFR calc non Af Amer Latest Range: >90 mL/min >90  GFR calc Af Amer Latest Range: >90 mL/min >90  Glucose Latest Range: 70-99 mg/dL 153 (H)  Anion gap Latest Range: 5-15  14     Katie Palmer is an 63 y.o. female with a PMH of HTN, uncontrolled DM (last hemoglobin A1c 10.4% 06/05/13), who presets with a one week history of insomnia accompanied by leg cramps, difficulty concentrating, fatigue, myalgias, and over the past 48 hours, chest pain/indigestion (burning sensation relieved by vomiting), palpitations, but no SOB, cough. The patient states she was diagnosed with DM 11/14, and has had difficulty controlling her blood glucoses despite seeing an endocrinologist and trying multiple oral medications. Had started on Invokana approx 3 weeks ago. Checks blood sugars at  home and keeps log. Sees Dr. Renne Crigler for endo. Pt will see her tomorrow am since she has appt. Discussed patho of Type 1 DM. Answered questions regarding diet, exercise, hypoglycemia s/s and treatment, and insulin administration. Pt has received samples of both Levemir and Lantus from MD office since co-pay is >$200. May need to switch to more affordable insulin, which will be determined by OP MD.  Discussed meal coverage insulin and s/s and pt voices understanding.  Pt very motivated to control diabetes. Interested in attending OP Diabetes education. This order was placed and pt will receieve phone call to schedule appt. Given Living Well With Diabetes. Answered all questions. Discussed above with MD.  Thank you. Lorenda Peck, RD, LDN, CDE Inpatient Diabetes Coordinator [phone]

## 2013-07-31 ENCOUNTER — Ambulatory Visit: Payer: BC Managed Care – PPO | Admitting: Internal Medicine

## 2013-08-01 ENCOUNTER — Telehealth: Payer: Self-pay | Admitting: Family Medicine

## 2013-08-01 NOTE — Telephone Encounter (Signed)
[facility] # [place], [place] is requesting re-fill on traMADol (ULTRAM) 50 MG tablet

## 2013-08-02 LAB — GLUTAMIC ACID DECARBOXYLASE AUTO ABS

## 2013-08-02 MED ORDER — TRAMADOL HCL 50 MG PO TABS: 100.0000 mg | ORAL_TABLET | Freq: Four times a day (QID) | ORAL | Status: DC | PRN

## 2013-08-02 NOTE — Telephone Encounter (Signed)
I called in script 

## 2013-08-02 NOTE — Telephone Encounter (Signed)
Call in #[phone] with 5 rf 

## 2013-08-03 ENCOUNTER — Ambulatory Visit (INDEPENDENT_AMBULATORY_CARE_PROVIDER_SITE_OTHER): Payer: BC Managed Care – PPO | Admitting: Internal Medicine

## 2013-08-03 VITALS — BP 118/64 | HR 87 | Temp 98.6°F | Resp 12 | Wt 154.0 lb

## 2013-08-03 DIAGNOSIS — E1310 Other specified diabetes mellitus with ketoacidosis without coma: Secondary | ICD-10-CM

## 2013-08-03 MED ORDER — INSULIN ASPART 100 UNIT/ML ~~LOC~~ SOLN: SUBCUTANEOUS | Status: DC

## 2013-08-03 MED ORDER — INSULIN GLARGINE 100 UNIT/ML SOLOSTAR PEN: 7.0000 [IU] | PEN_INJECTOR | Freq: Every day | SUBCUTANEOUS | Status: DC

## 2013-08-03 NOTE — Patient Instructions (Addendum)
Please increase Lantus to 7 units at bedtime. Increase NovoLog as follows: 3 units before a small meal 4 units before a larger meal 5 units if you eat out or if you eat dessert Continue NovoLog sliding scale: 150-200: + 1 unit 201-250: + 2 units 251-300: + 3 units > 300: + 4 units Please call me or send me  Message with your blood sugars in 10-14 days to see if we need to adjust the doses further.  Please return in 1 month with your sugar log.   Please stop at the lab.

## 2013-08-03 NOTE — Progress Notes (Signed)
Patient ID: Katie Palmer, female   DOB: August 11, 1950, 63 y.o.   MRN: [id number]  HPI: Katie Palmer is a 63 y.o.-year-old female, returning for f/u for DM, dx 11/2012, uncontrolled, with complications (DKA admission 07/28/2013). Last visit 2 mo ago.   She has been hospitalized for DKA 1 week ago. She was d/c'd on Lantus and mealtime insulin.  Last Hemoglobin A1c was: Lab Results  Component Value Date   HGBA1C 10.4* 06/05/2013   HGBA1C 10.4* 02/24/2013   HGBA1C 10.0* 11/29/2012   Pt was on a regimen of: - Metformin 1000 mg po bid - can tolerate well the high dose - Lantus 12 or 15 units at bedtime - Invokana 100 mg in am She was on Glipizide 10 mg bid >> weight gain Had to stop Januvia 2/2 cost. She was doing well on this. We tried to stop the Lantus as we started Januvia but her sugars greatly increased then.   After discharge, she is on: - Lantus 6 units hs - NovoLog 3 units tid ac - NovoLog SSI: target 150, ISF 50  She will have a carb counting nutrition appt on 08/17/2013.   She has an Gaffer.  Pt checks her sugars 1-3 a day and they are still fluctuating per her detailed log: - am: 187-293 >> 47-150, most 50-90 >> 52-507 >> 107, 124, 144, 204, 358 this am - 2h after b'fast: n/c >> 59-149 >> 216, 328 >> 304 - before lunch: 198-241 >> 58-100 >> 121-391 >> 130, 213, 215 - 2h after lunch: 174-261 >> 72-221 (130-140) >> 249-332 >> 179, 306 - before dinner: 164-347 >> 83-107 >> 249-535 >> 257 - 2h after dinner: n/c >> 101-185 >> 298-450 >> n/c - bedtime: 198-371 -midnight  >> 131-234 >> n/c - nighttime: n/c ? If has hypoglycemia awareness. Highest sugar was 380s.  Pt's meals are: - Breakfast: boiled egg or scrambled egg + toast; not oatmeal - Lunch: salad - watches the dressing - Dinner: vegetable soup - made from scratch - Snacks: no No bread, no soft drinks. Exercises 3-5 x a week, walks daily, feels stronger.   - no CKD, last BUN/creatinine:  Lab Results   Component Value Date   BUN 15 07/30/2013   CREATININE 0.55 07/30/2013  She is on Losartan. - last set of lipids: Lab Results  Component Value Date   CHOL 189 07/30/2013   HDL 64 07/30/2013   LDLCALC 102* 07/30/2013   LDLDIRECT 145.5 02/04/2009   TRIG 117 07/30/2013   CHOLHDL 3.0 07/30/2013  She is not on a statin. - last eye exam - Dr Katie Palmer >> no DR - no numbness and tingling in her feet.  I reviewed pt's medications, allergies, PMH, social hx, family hx and no changes required, except as mentioned above. Stopped Metformin and Invokana at d/c.  ROS: Constitutional: no weight loss/gain, + increased appetite,+  fatigue, + poor sleep Eyes: + blurry vision, no xerophthalmia ENT: no sore throat, no nodules palpated in throat, no dysphagia/odynophagia, no hoarseness Cardiovascular: no CP/SOB/palpitations/+ leg swelling Respiratory: no cough/SOB Gastrointestinal: no N/V/D/+C/+ heartburn Musculoskeletal: + muscle aches/no joint aches Skin: + rash - arm (from IV in the hosp.), + easy bruising, + hair loss Neurological: no tremors/numbness/tingling/dizziness, + HA  PE: BP 118/64  Pulse 87  Temp(Src) 98.6 F (37 C) (Oral)  Resp 12  Wt 154 lb (69.854 kg)  SpO2 98% Body mass index is 25.63 kg/(m^2).  Wt Readings from Last 3 Encounters:  08/03/13 154 lb (69.854  kg)  07/29/13 156 lb 12 oz (71.1 kg)  06/05/13 166 lb (75.297 kg)   Constitutional: normal weight, in NAD, but feels in a fog Eyes: PERRLA, EOMI, no exophthalmos ENT: moist mucous membranes, no thyromegaly, no cervical lymphadenopathy Cardiovascular: RRR, No MRG, + nonpitting LE edema Respiratory: CTA B Gastrointestinal: abdomen soft, NT, ND, BS+ Musculoskeletal: no deformities, strength intact in all 4 Skin: moist, warm, no rashes Neurological: no tremor with outstretched hands, DTR normal in all 4  ASSESSMENT: 1. DM1, insulin-dependent, uncontrolled, with complications - DKA episode after starting Invokana (despite being on  Lantus) 07/28/2013 PLAN:  1. Patient with recently dx-ed diabetes - which appears to be DM1 based on insulin-dependency, her thin body habitus and recent DKA admission. Her sugars are high today, 400, so we cannot check a C-peptide, but will check anti-pancreatic antibodies to confirm DM1. - her sugars are escalating towards the end of the day >> will need more mealtime insulin. She will have a carb counting carb soon >> we can then switch to an ICR, which would be best to control her very fluctuating sugars - I advised her to: Patient Instructions  Please increase Lantus to 7 units at bedtime. Increase NovoLog as follows: 3 units before a small meal 4 units before a larger meal 5 units if you eat out or if you eat dessert Continue NovoLog sliding scale: 150-200: + 1 unit 201-250: + 2 units 251-300: + 3 units > 300: + 4 units Please call me or send me  Message with your blood sugars in 10-14 days to see if we need to adjust the doses further.  Please return in 1 month with your sugar log.   Please stop at the lab.  - Continue checking sugars at different times of the day - check 3-4 times a day, rotating checks - check A1c at next visit - Return in about 1 month (around 09/03/2013).   Office Visit on 08/03/2013  Component Date Value Ref Range Status  . Glutamic Acid Decarb Ab 08/03/2013 >30.0* <=1.0 U/mL Final  . Pancreatic Islet Cell Antibody 08/03/2013 >80* < 5 JDF Units Final   Comment:                             Laboratory Developed Test performed using a reagent labeled by                           the manufacturer as ASR Class I or ASR Class II (non-blood bank).                                                      This test(s) was developed and its performance characteristics                           have been determined by [employer],                           [place], [place]. It has not been cleared or approved by the U.S.  Food and Drug Administration. The FDA has determined that such                           clearance or approval is not necessary. Performance                           characteristics refer to the analytical performance of the test.   Type 1 diabetes, likely LADA.

## 2013-08-04 ENCOUNTER — Ambulatory Visit: Payer: BC Managed Care – PPO | Admitting: Internal Medicine

## 2013-08-04 LAB — INSULIN ANTIBODIES, BLOOD: INSULIN ANTIBODIES, HUMAN: 3.5 U/mL — AB (ref <0.4)

## 2013-08-05 NOTE — Discharge Summary (Signed)
Physician Discharge Summary  Katie Palmer JQB:[id number] DOB: [birth year] DOA: 07/28/2013  PCP: Laurey Morale, MD  Admit date: 07/28/2013 Discharge date: 07/30/13   Recommendations for Outpatient Follow-Up:   1. Recommend close follow up with endocrinologist to assess glycemic control.   Discharge Diagnosis:   Principal Problem:    DKA (diabetic ketoacidoses) Active Problems:    HYPERTENSION    INSOMNIA    Fibromyalgia    DM (diabetes mellitus), uncontrolled, with ketoacidosis    Leukocytosis    Dehydration    GERD (gastroesophageal reflux disease)    Severe protein-calorie malnutrition   Discharge Condition: Improved.  Diet recommendation: Low sodium, heart healthy.  Carbohydrate-modified.     History of Present Illness:   Katie Palmer is an 63 y.o. female with a PMH of HTN, uncontrolled DM (last hemoglobin A1c 10.4% 06/05/13), who was admitted 07/28/13 with a one week history of insomnia accompanied by leg cramps, difficulty concentrating, fatigue, myalgias, and over the past 48 hours, chest pain/indigestion (burning sensation relieved by vomiting), palpitations, but no SOB, cough. Upon initial valuation in the ED, the patient was found to be in DKA with a bicarbonate of 8, a glucose of 421, and an anion gap of 38.  Hospital Course by Problem:   Principal Problem:  DKA (diabetic ketoacidoses) / DM (diabetes mellitus), uncontrolled, with ketoacidosis  Status post IV fluid bolus in the ER consisting of normal saline x2 liters, continue to vigorously hydrate with D5 and half-normal saline.  Initially treated with insulin drip per glucose stabilizer protocol. When anion gap closed and acidosis resolved, transitioned to basal/bolus insulin. Insulin and glutamic acid decarboxylase autoantibodies high, consistent with type I diabetes.  Seen and evaluated by diabetes coordinator and dietitian who provided patient education.  Active Problems:  Severe protein calorie  malnutrition  Albumin 4.6. Dietician consultation performed. GERD  PPI ordered. HYPERTENSION  Losartan/HCTZ held secondary to low BP.  OK to resume at discharge.  INSOMNIA  Continue Xanax Q HS PRN. Fibromyalgia  Continue Mobic, PRN Tramadol. Leukocytosis  Chest x-ray clear. Suspect elevation from hemoconcentration. Resolved. Dehydration  Hydrated. Resolved.   Procedures:    None.   Medical Consultants:    None.   Discharge Exam:   Filed Vitals:   07/30/13 0453  BP: 101/67  Pulse: 76  Temp: 97.5 F (36.4 C)  Resp: 16   Filed Vitals:   07/29/13 1433 07/29/13 2101 07/30/13 0209 07/30/13 0453  BP: 101/63 116/52 111/66 101/67  Pulse: 76 86 81 76  Temp: 97.4 F (36.3 C) 98 F (36.7 C) 97.4 F (36.3 C) 97.5 F (36.4 C)  TempSrc: Oral Axillary Oral Oral  Resp: 16 16 16 16   Height:      Weight:      SpO2: 98% 98% 96% 98%    Gen:  NAD Cardiovascular:  RRR, No M/R/G Respiratory: Lungs CTAB Gastrointestinal: Abdomen soft, NT/ND with normal active bowel sounds. Extremities: No C/E/C    Discharge Instructions:   Discharge Instructions   Ambulatory referral to Nutrition and Diabetic Education    Complete by:  As directed      Call MD for:  extreme fatigue    Complete by:  As directed      Call MD for:    Complete by:  As directed   Fruity smell to breath, excessive thirst, excessive urination, blood glucoses consistently greater than 250.     Diet - low sodium heart healthy    Complete by:  As directed  Diet Carb Modified    Complete by:  As directed      Discharge instructions    Complete by:  As directed   It is VERY IMPORTANT that you follow up with a PCP or endocrinologist on a regular basis.  Check your blood glucoses before each meal and at bedtime and maintain a log of your readings.  Bring this log with you when you follow up with your PCP or endocrinologist so that he or she can adjust your insulin at your follow up visit.  If your before  meals sugars are consistently higher than 180, increase her meal coverage to 4 units. Can increase further by one unit every 24 hours as needed for blood sugars consistently higher than 180.  If your fasting sugar in the morning is less than 100, decrease the Lantus dose to 5 units.  If your fasting sugar in the morning is > 180 AND all of your other sugars are > 180, increase the Lantus by one unit every 24 hours until the sugars are below 180.  If you smell a fruity smell on your breath, or have excessive thirst or urination, check your sugar immediately and contact your doctor.  Stop all other diabetes medications.    You were cared for by Dr. Jacquelynn Cree  (a hospitalist) during your hospital stay. If you have any questions about your discharge medications or the care you received while you were in the hospital after you are discharged, you can call the unit and ask to speak with the hospitalist on call if the hospitalist that took care of you is not available. Once you are discharged, your primary care physician will handle any further medical issues. Please note that NO REFILLS for any discharge medications will be authorized once you are discharged, as it is imperative that you return to your primary care physician (or establish a relationship with a primary care physician if you do not have one) for your aftercare needs so that they can reassess your need for medications and monitor your lab values.  Any outstanding tests can be reviewed by your PCP at your follow up visit.  It is also important to review any medicine changes with your PCP.  Please bring these d/c instructions with you to your next visit so your physician can review these changes with you.     Increase activity slowly    Complete by:  As directed             Medication List    STOP taking these medications       Canagliflozin 100 MG Tabs     Insulin Glargine 100 UNIT/ML Solostar Pen  Commonly known as:  LANTUS       metFORMIN 1000 MG (MOD) 24 hr tablet  Commonly known as:  GLUMETZA      TAKE these medications       ACCU-CHEK SOFTCLIX LANCETS lancets  Use as instructed     ALPRAZolam 1 MG tablet  Commonly known as:  XANAX  Take 1 tablet (1 mg total) by mouth at bedtime as needed for sleep.     BC HEADACHE POWDER PO  Take 1 each by mouth as needed (headaches).     cyclobenzaprine 10 MG tablet  Commonly known as:  FLEXERIL  Take 1 tablet (10 mg total) by mouth 3 (three) times daily as needed.     glucose blood test strip  Use 3x a day - AccuCheck nano  insulin aspart 100 UNIT/ML injection  Commonly known as:  novoLOG  - Use this scale for bedtime coverage only: CBG < 70: Drink juice;      -   CBG 70 -200 (dose in units): 0 units;  CBG 201 - 250: 2 units; CBG 251 - 300: 3 units; CBG 301 - 350: 4 units; CBG 351 - 400: 5 units;CBG > 400 Call MD     Insulin Pen Needle 32G X 5 MM Misc  Commonly known as:  CAREFINE PEN NEEDLES  Use once a day     losartan-hydrochlorothiazide 50-12.5 MG per tablet  Commonly known as:  HYZAAR  Take 1 tablet by mouth daily.     meclizine 25 MG tablet  Commonly known as:  ANTIVERT  Take 1 tablet (25 mg total) by mouth every 4 (four) hours as needed for dizziness.     meloxicam 15 MG tablet  Commonly known as:  MOBIC  Take 15 mg by mouth every morning.     ondansetron 4 MG disintegrating tablet  Commonly known as:  ZOFRAN ODT  Take 1 tablet (4 mg total) by mouth every 8 (eight) hours as needed for nausea.     SUMAtriptan 100 MG tablet  Commonly known as:  IMITREX  Take 100 mg by mouth as needed for migraine or headache. May repeat in 2 hours if headache persists or recurs.           Follow-up Information   Follow up with FRY,STEPHEN A, MD. Schedule an appointment as soon as possible for a visit in 1 week.   Specialty:  Family Medicine   Contact information:   [place] [address] [phone]        The results  of significant diagnostics from this hospitalization (including imaging, microbiology, ancillary and laboratory) are listed below for reference.     Significant Diagnostic Studies:   Radiographs: Dg Chest 2 View  07/28/2013   CLINICAL DATA:  Chest pain, vomiting  EXAM: CHEST  2 VIEW  COMPARISON:  None.  FINDINGS: Lungs are clear.  No pleural effusion or pneumothorax.  The heart is normal in size.  Degenerative changes of the visualized thoracolumbar spine.  IMPRESSION: No evidence of acute cardiopulmonary disease.   Electronically Signed   By: Julian Hy M.D.   On: 07/28/2013 17:13    Labs:  Basic Metabolic Panel:  Recent Labs Lab 07/30/13 0408  NA 141  K 3.8  CL 107  CO2 20  GLUCOSE 153*  BUN 15  CREATININE 0.55  CALCIUM 8.4   GFR Estimated Creatinine Clearance: 70.2 ml/min (by C-G formula based on Cr of 0.55).  CBC:  Recent Labs Lab 07/30/13 0408  WBC 8.2  HGB 12.6  HCT 36.9  MCV 88.5  PLT 157   CBG:  Recent Labs Lab 07/29/13 1154 07/29/13 1735 07/29/13 2121 07/30/13 0749 07/30/13 1216  GLUCAP 152* 97 284* 135* 128*   Microbiology Recent Results (from the past 240 hour(s))  MRSA PCR SCREENING     Status: None   Collection Time    07/28/13  5:52 PM      Result Value Ref Range Status   MRSA by PCR NEGATIVE  NEGATIVE Final   Comment:            The GeneXpert MRSA Assay (FDA     approved for NASAL specimens     only), is one component of a     comprehensive MRSA colonization  surveillance program. It is not     intended to diagnose MRSA     infection nor to guide or     monitor treatment for     MRSA infections.    Time coordinating discharge: 35 minutes.  Signed:  Eyan Hagood  Pager [phone] [facility] 08/05/2013, 10:54 AM

## 2013-08-07 LAB — GLUTAMIC ACID DECARBOXYLASE AUTO ABS

## 2013-08-08 ENCOUNTER — Encounter: Payer: Self-pay | Admitting: Internal Medicine

## 2013-08-08 LAB — ANTI-ISLET CELL ANTIBODY

## 2013-08-09 ENCOUNTER — Encounter: Payer: Self-pay | Admitting: Internal Medicine

## 2013-08-10 ENCOUNTER — Other Ambulatory Visit: Payer: Self-pay | Admitting: *Deleted

## 2013-08-10 ENCOUNTER — Encounter: Payer: Self-pay | Admitting: Internal Medicine

## 2013-08-10 MED ORDER — INSULIN GLARGINE 100 UNIT/ML SOLOSTAR PEN: 7.0000 [IU] | PEN_INJECTOR | Freq: Every day | SUBCUTANEOUS | Status: DC

## 2013-08-10 MED ORDER — INSULIN PEN NEEDLE 32G X 4 MM MISC: Status: DC

## 2013-08-17 ENCOUNTER — Encounter: Payer: BC Managed Care – PPO | Attending: Internal Medicine

## 2013-08-17 VITALS — Ht 65.0 in | Wt 166.0 lb

## 2013-08-17 DIAGNOSIS — Z794 Long term (current) use of insulin: Secondary | ICD-10-CM | POA: Diagnosis not present

## 2013-08-17 DIAGNOSIS — Z713 Dietary counseling and surveillance: Secondary | ICD-10-CM | POA: Insufficient documentation

## 2013-08-17 DIAGNOSIS — E119 Type 2 diabetes mellitus without complications: Secondary | ICD-10-CM | POA: Insufficient documentation

## 2013-08-18 ENCOUNTER — Encounter: Payer: Self-pay | Admitting: Internal Medicine

## 2013-08-20 NOTE — Progress Notes (Signed)
Patient was seen on 08/17/13 for the first of a series of three diabetes self-management courses at [facility].  Patient Education Plan per assessed needs and concerns is to attend four course education program for Diabetes Self Management Education.  Current HbA1c: 10.4%  The following learning objectives were met by the patient during this class:  Describe diabetes  State some common risk factors for diabetes  Defines the role of glucose and insulin  Identifies type of diabetes and pathophysiology  Describe the relationship between diabetes and cardiovascular risk  State the members of the Healthcare Team  States the rationale for glucose monitoring  State when to test glucose  State their individual Target Range  State the importance of logging glucose readings  Describe how to interpret glucose readings  Identifies A1C target  Explain the correlation between A1c and eAG values  State symptoms and treatment of high blood glucose  State symptoms and treatment of low blood glucose  Explain proper technique for glucose testing  Identifies proper sharps disposal  Handouts given during class include:  Living Well with Diabetes book  Carb Counting and Meal Planning book  Meal Plan Card  Carbohydrate guide  Meal planning worksheet  Low Sodium Flavoring Tips  The diabetes portion plate  T5V to eAG Conversion Chart  Diabetes Medications  Diabetes Recommended Care Schedule  Support Group  Diabetes Success Plan  Core Class Satisfaction Survey  Follow-Up Plan:  Attend core 2

## 2013-08-24 ENCOUNTER — Ambulatory Visit: Payer: BC Managed Care – PPO

## 2013-08-25 ENCOUNTER — Encounter: Payer: Self-pay | Admitting: Family Medicine

## 2013-08-28 ENCOUNTER — Telehealth: Payer: Self-pay | Admitting: Family Medicine

## 2013-08-28 DIAGNOSIS — E1065 Type 1 diabetes mellitus with hyperglycemia: Secondary | ICD-10-CM

## 2013-08-28 DIAGNOSIS — E108 Type 1 diabetes mellitus with unspecified complications: Principal | ICD-10-CM

## 2013-08-28 DIAGNOSIS — IMO0002 Reserved for concepts with insufficient information to code with codable children: Secondary | ICD-10-CM

## 2013-08-28 MED ORDER — FUROSEMIDE 20 MG PO TABS: 20.0000 mg | ORAL_TABLET | Freq: Every day | ORAL | Status: DC

## 2013-08-28 NOTE — Telephone Encounter (Signed)
Call in Lasix 20 mg daily, #30 with 5 rf

## 2013-08-28 NOTE — Telephone Encounter (Signed)
I sent script e-scribe and spoke with pt. Also she is going to schedule a hospital follow up.

## 2013-08-28 NOTE — Telephone Encounter (Signed)
Referral was done  

## 2013-08-28 NOTE — Telephone Encounter (Addendum)
Pt would like a referral to dr Buddy Duty for dm type 1. Pt has bcbs. Pt no longer want to see dr Cruzita Lederer

## 2013-08-30 ENCOUNTER — Ambulatory Visit (INDEPENDENT_AMBULATORY_CARE_PROVIDER_SITE_OTHER): Payer: BC Managed Care – PPO | Admitting: Family Medicine

## 2013-08-31 ENCOUNTER — Ambulatory Visit: Payer: BC Managed Care – PPO

## 2013-09-04 ENCOUNTER — Encounter: Payer: BC Managed Care – PPO | Attending: Internal Medicine | Admitting: *Deleted

## 2013-09-04 ENCOUNTER — Ambulatory Visit (INDEPENDENT_AMBULATORY_CARE_PROVIDER_SITE_OTHER): Payer: BC Managed Care – PPO | Admitting: Family Medicine

## 2013-09-04 ENCOUNTER — Encounter: Payer: Self-pay | Admitting: Family Medicine

## 2013-09-04 ENCOUNTER — Encounter: Payer: Self-pay | Admitting: *Deleted

## 2013-09-04 VITALS — BP 108/80 | HR 70 | Temp 98.0°F | Ht 65.0 in | Wt 174.0 lb

## 2013-09-04 VITALS — Ht 65.0 in | Wt 171.6 lb

## 2013-09-04 DIAGNOSIS — E1310 Other specified diabetes mellitus with ketoacidosis without coma: Secondary | ICD-10-CM

## 2013-09-04 DIAGNOSIS — Z713 Dietary counseling and surveillance: Secondary | ICD-10-CM | POA: Diagnosis present

## 2013-09-04 DIAGNOSIS — Z794 Long term (current) use of insulin: Secondary | ICD-10-CM | POA: Insufficient documentation

## 2013-09-04 DIAGNOSIS — E119 Type 2 diabetes mellitus without complications: Secondary | ICD-10-CM | POA: Diagnosis present

## 2013-09-04 DIAGNOSIS — R51 Headache: Secondary | ICD-10-CM

## 2013-09-04 DIAGNOSIS — I10 Essential (primary) hypertension: Secondary | ICD-10-CM

## 2013-09-04 MED ORDER — FUROSEMIDE 40 MG PO TABS: 40.0000 mg | ORAL_TABLET | Freq: Every day | ORAL | Status: DC

## 2013-09-04 NOTE — Progress Notes (Signed)
Appt start time: 1130 end time:  1030.  Assessment:  Patient was seen on  09/04/13 for individual diabetes education. Newly diagnosed with DM 1 at age 63. She works as Occupational hygienist, she is self employed so working fewer hours right now as she adjusts to new diagnosis. She buys and cooks her own meals. She used to play tennis, right now she is consumed with her health issues.She attended Core 1 but with DM 1 was more appropriate for individual education. States she has a lot of family stress in her life, but did not go into any explanation of what she meant by that. She is not happy with weight gain since she started on insulin 3 weeks ago.  Patient Education Plan per assessed needs and concerns is to attend individual session for Diabetes Self Management Education.  Current HbA1c: 10.4%  Preferred Learning Style:  No preference indicated   Learning Readiness:   Ready  Change in progress  MEDICATIONS: see list, diabetes medications are Lantus and Novolog  DIETARY INTAKE:  24-hr recall:  B ( AM): now has whole wheat toast with 1 egg, water  Snk ( AM): peanuts OR almonds Or Nutri-grain bar  L ( PM): 1/2 sandwich and fresh fruit occasionally or small salad, OR salad with chicken added, water or unsweet tea Snk ( PM): same as AM, or pretzel chips out of the bag D ( PM): salad with chicken added OR meat, salad, infrequently bread or starch type food, water or unsweet tea Snk ( PM): pretzels OR Nutri-grain bar Beverages: water or unsweet tea   Usual physical activity: walks 1.2 miles each day with her dog for now down to 30 minutes without any pain  Estimated energy needs: 1200 calories 135 g carbohydrates 90 g protein 33 g fat  Progress Towards Goal(s):  In progress.   Nutritional Diagnosis:  NB-1.1 Food and nutrition-related knowledge deficit As related to new onset of DM 1 Diabetes.  As evidenced by A1c of 10.4%.    Intervention:  Nutrition counseling  provided.  Discussed diabetes disease process and treatment options.  Discussed physiology of Type 1 and Type 2 diabetes and role of obesity on insulin resistance.  Encouraged moderate weight reduction to improve glucose levels.    Provided education on macronutrients on glucose levels.  Provided education on carb counting, importance of regularly scheduled meals/snacks, and meal planning  Discussed effects of physical activity on glucose levels and long-term glucose control.  Suggested she continue with her current amount of  physical activity/week.  Reviewed patient medications.  Discussed role of medication on blood glucose and possible side effects  Discussed blood glucose monitoring and interpretation.  Discussed recommended target ranges and individual ranges.    Provided information on the following:  Short-term complications: hyper- and hypo-glycemia.  Discussed causes,symptoms, and treatment options.  Prevention, detection, and treatment of long-term complications.  Discussed the role of prolonged elevated glucose levels on body systems.  Role of stress on blood glucose levels and discussed strategies to manage psychosocial issues.  Recommendations for long-term diabetes self-care.  Provided checklist for medical, dental, and emotional self-care.  Plan:  Aim for 2 Carb Choices per meal (30 grams) +/- 1 either way  Aim for 0-1 Carbs per snack if hungry  Include protein in moderation with your meals and snacks Consider reading food labels for Total Carbohydrate of foods Continue with your activity level by walking for 30 minutes daily as tolerated Continue checking BG at alternate times per  day as directed by MD   Teaching Method Utilized: Visual, Auditory and Hands on  Handouts given during visit include: Carb Counting and Food Label handouts Meal Plan Card DM 1 Support Group Flyer  Grocery List   Insulin Action handout  APP Resource List  Barriers to  learning/adherence to lifestyle change: stress of new onset of DM 1 in addition to Fibromyalgia  Diabetes self-care support plan:   [facility] DM 1 support group  Demonstrated degree of understanding via:  Teach Back   Monitoring/Evaluation:  Dietary intake, exercise, reading food labels, and body weight in 1 month(s).

## 2013-09-04 NOTE — Progress Notes (Signed)
Pre visit review using our clinic review tool, if applicable. No additional management support is needed unless otherwise documented below in the visit note. 

## 2013-09-04 NOTE — Patient Instructions (Signed)
Plan:  Aim for 2 Carb Choices per meal (30 grams) +/- 1 either way  Aim for 0-1 Carbs per snack if hungry  Include protein in moderation with your meals and snacks Consider reading food labels for Total Carbohydrate of foods Continue with your activity level by walking for 30 minutes daily as tolerated Continue checking BG at alternate times per day as directed by MD

## 2013-09-04 NOTE — Progress Notes (Signed)
   Subjective:    Patient ID: Katie Palmer, female    DOB: Jun 22, 1950, 63 y.o.   MRN: [id number]  HPI Here to follow up a hospital stay from 07-28-13 to 07-30-13 for DKA. Her admission glucose was 421 and her last A1c in May was 10.4. During her admission all her oral diabetic meds were stopped and she was placed on Lantus and Novolog. Her Lantus is at 9 units each evening and the Novolog is a sliding scale. She feels a little better but her glucoses are still quite labile, jumping from lows in the 50s to highs over 400. She has met with a diabetic educator and is learning a lot about nutrition. She has an appt to see Dr. Buddy Duty in December.    Review of Systems  Constitutional: Negative.   Respiratory: Negative.   Cardiovascular: Positive for leg swelling. Negative for chest pain and palpitations.       Objective:   Physical Exam  Constitutional: She appears well-developed and well-nourished.  Cardiovascular: Normal rate, regular rhythm, normal heart sounds and intact distal pulses.   Pulmonary/Chest: Effort normal and breath sounds normal.  Musculoskeletal:  2+ edema to both lower legs           Assessment & Plan:  We will increase the Lasix to 40 mg a day. We will try to get her appt with Dr. Buddy Duty moved up ASAP.

## 2013-09-20 ENCOUNTER — Telehealth: Payer: Self-pay | Admitting: Family Medicine

## 2013-09-20 NOTE — Telephone Encounter (Signed)
Pt states she had a reaction to her rx furosemide (LASIX) 40 MG tablet, pt states she has had diarrhea every since she has been on it and would like to know if she can try something else.

## 2013-09-21 MED ORDER — TORSEMIDE 10 MG PO TABS: 10.0000 mg | ORAL_TABLET | Freq: Every day | ORAL | Status: DC

## 2013-09-21 NOTE — Telephone Encounter (Signed)
I spoke with pt and even on the 20 mg of Lasix, she was having these symptoms. She is still having swelling and needs something else to try.

## 2013-09-21 NOTE — Telephone Encounter (Signed)
Why is she taking Lasix? If I knew that, maybe she could simply stop it

## 2013-09-21 NOTE — Telephone Encounter (Signed)
Stop Lasix and call in Demadex 10 mg daily, #90 with 3 rf

## 2013-09-21 NOTE — Telephone Encounter (Signed)
I added Lasix to drug allergy list, removed from current medication, sent new script e-scribe and left a voice message for pt.

## 2013-09-27 ENCOUNTER — Other Ambulatory Visit: Payer: Self-pay | Admitting: Family Medicine

## 2013-10-04 ENCOUNTER — Other Ambulatory Visit: Payer: Self-pay | Admitting: Family Medicine

## 2013-10-09 ENCOUNTER — Encounter: Payer: BC Managed Care – PPO | Attending: Internal Medicine | Admitting: *Deleted

## 2013-10-09 VITALS — Ht 65.0 in | Wt 169.3 lb

## 2013-10-09 DIAGNOSIS — Z794 Long term (current) use of insulin: Secondary | ICD-10-CM | POA: Diagnosis not present

## 2013-10-09 DIAGNOSIS — E119 Type 2 diabetes mellitus without complications: Secondary | ICD-10-CM | POA: Diagnosis present

## 2013-10-09 DIAGNOSIS — Z713 Dietary counseling and surveillance: Secondary | ICD-10-CM | POA: Diagnosis present

## 2013-10-09 DIAGNOSIS — E1310 Other specified diabetes mellitus with ketoacidosis without coma: Secondary | ICD-10-CM

## 2013-10-09 NOTE — Progress Notes (Signed)
Appt start time: 1100 end time:  1200.  Assessment:  Patient was seen on  10/09/13 for individual diabetes education follow up. She is here today with her son, Darnelle Maffucci, stating that she wants him to hear the same information in case she forgets something today. He appears supportive. She is happy with 2 pound weight loss since last visit here on 09/04/13. She states she is now seeing Dr. Barbette Hair. Continues to feel overwhelmed and scared about the diagnosis of DM1, especially at her age. She states her Lantus dose is now split between morning and evening and the total dose is now 11 units per day instead of 9. She states she is having some hypoglycemia in the 40's and 50's at various times of day as well as during the night. She feels she understands the basic concept of carb counting that we discussed at last visit, but would like a review today.  Patient Education Plan per assessed needs and concerns is to attend individual session for Diabetes Self Management Education.  Current HbA1c: 10.4%  Preferred Learning Style:  No preference indicated   Learning Readiness:   Ready  Change in progress  MEDICATIONS: see list, diabetes medications are Lantus and Novolog  DIETARY INTAKE:  24-hr recall:  B ( AM): now has 1 slice whole wheat toast with 1 egg, water  Snk ( AM): peanuts OR almonds Or Nutri-grain bar  L ( PM): 1/2 sandwich and fresh fruit occasionally or small salad, OR salad with chicken added, water or unsweet tea Snk ( PM): same as AM, no more pretzel chips out of the bag D ( PM): salad with chicken added OR meat, salad, infrequently bread or starch type food, water or unsweet tea Snk ( PM): pretzels OR Nutri-grain bar Beverages: water or unsweet tea   Usual physical activity: walks 1.2 miles each day with her dog. She has increased her pace so she is now walking that same distance in only 30 minutes and without any pain. She has not been carrying any food or glucose tablets with her  when she walks  Estimated energy needs: 1200 calories 135 g carbohydrates 90 g protein 33 g fat  Progress Towards Goal(s):  In progress.   Nutritional Diagnosis:  NB-1.1 Food and nutrition-related knowledge deficit As related to new onset of DM 1 Diabetes.  As evidenced by A1c of 10.4%.    Intervention:  Nutrition counseling provided.  Reviewed education on carb counting including by food group as well as food labels and practiced meal planning with use of food models. Encouraged her to aim for an average of 2 Carb Choices at every meal especially before any exercise  Discussed effects of physical activity on glucose levels and long-term glucose control.  Suggested she continue with her current amount of  physical activity/week. Instructed her to carry a food with 15 grams carbohydrate or Glucose Tablets with her when walking.  Reviewed patient medications.  Discussed role of medication on blood glucose and possible side effects. Reviewed insulin action and relationship of matching carb intake to insulin doses to manage BG better  Discussed blood glucose monitoring and interpretation.  Discussed recommended target ranges and individual ranges.    Short-term complications: hyper- and hypo-glycemia.  Discussed causes,symptoms, and treatment options.  Plan:  Aim for 2 Carb Choices per meal (30 grams) +/- 1 either way  Aim for 0-1 Carbs per snack if hungry  Include protein in moderation with your meals and snacks Consider reading food labels  for Total Carbohydrate of foods Continue with your activity level by walking for 30 minutes daily as tolerated Continue checking BG at alternate times per day as directed by MD  Bring a carb containing snack or glucose tablets with you when you walk  Teaching Method Utilized: Visual, Auditory and Hands on  Handouts given during visit include: Carb Counting and Food Label handouts Meal Plan Card  Barriers to learning/adherence to lifestyle  change: stress of new onset of DM 1 in addition to Fibromyalgia  Diabetes self-care support plan:   [facility] DM 1 support group  Demonstrated degree of understanding via:  Teach Back   Monitoring/Evaluation:  Dietary intake, exercise, reading food labels, and body weight PRN.

## 2013-10-11 ENCOUNTER — Encounter: Payer: Self-pay | Admitting: *Deleted

## 2013-10-20 ENCOUNTER — Telehealth: Payer: Self-pay | Admitting: Family Medicine

## 2013-10-20 NOTE — Telephone Encounter (Signed)
[id number] - [place], [place] AT [place] is requesting re-fill on meloxicam (MOBIC) 15 MG tablet

## 2013-10-22 ENCOUNTER — Encounter: Payer: Self-pay | Admitting: Family Medicine

## 2013-10-23 MED ORDER — MELOXICAM 15 MG PO TABS: 15.0000 mg | ORAL_TABLET | Freq: Every morning | ORAL | Status: DC

## 2013-10-23 NOTE — Telephone Encounter (Signed)
Refill for one year 

## 2013-10-23 NOTE — Telephone Encounter (Signed)
We cannot rewrite all her prescriptions now since this has already been done. It would be okay to refill the Xanax a little early, though, so please call in #90 with one rf

## 2013-10-23 NOTE — Telephone Encounter (Signed)
I sent script e-scribe. 

## 2013-10-30 ENCOUNTER — Encounter: Payer: Self-pay | Admitting: Family Medicine

## 2013-10-31 MED ORDER — ALPRAZOLAM 1 MG PO TABS: 1.0000 mg | ORAL_TABLET | Freq: Every evening | ORAL | Status: DC | PRN

## 2013-11-02 ENCOUNTER — Encounter: Payer: Self-pay | Admitting: Family Medicine

## 2013-11-03 NOTE — Telephone Encounter (Signed)
I agree with trying Topamax for her headaches. Start at 25 mg every day. Call in #30 with 2 rf. We can increase the dose if needed so let us know in 2 weeks

## 2013-11-06 MED ORDER — TOPIRAMATE 25 MG PO TABS
25.0000 mg | ORAL_TABLET | Freq: Every day | ORAL | Status: DC
Start: 2013-11-06 — End: 2014-01-22

## 2013-11-16 ENCOUNTER — Other Ambulatory Visit: Payer: Self-pay | Admitting: Family Medicine

## 2013-11-29 ENCOUNTER — Telehealth: Payer: Self-pay | Admitting: Family Medicine

## 2013-11-29 MED ORDER — MECLIZINE HCL 25 MG PO TABS: 25.0000 mg | ORAL_TABLET | ORAL | Status: DC | PRN

## 2013-11-29 NOTE — Telephone Encounter (Signed)
I sent script e-scribe. 

## 2013-11-29 NOTE — Telephone Encounter (Signed)
[id number] - [place], [place] AT [place] is requesting re-fill on meclizine (ANTIVERT) 25 MG tablet

## 2014-01-02 ENCOUNTER — Telehealth: Payer: Self-pay | Admitting: Family Medicine

## 2014-01-02 ENCOUNTER — Encounter: Payer: Self-pay | Admitting: Family Medicine

## 2014-01-02 NOTE — Telephone Encounter (Addendum)
Pt has sent email and needs tramadol tonight sent to new pharm [facility] [phone]. Pt is out.

## 2014-01-03 MED ORDER — TRAMADOL HCL 50 MG PO TABS: 100.0000 mg | ORAL_TABLET | Freq: Four times a day (QID) | ORAL | Status: DC | PRN

## 2014-01-03 NOTE — Telephone Encounter (Signed)
This is a duplicate note, see pt e-mail.

## 2014-01-03 NOTE — Telephone Encounter (Signed)
Per Dr. [name] send in 1 refill.

## 2014-01-03 NOTE — Telephone Encounter (Signed)
She should be able to get this refilled now

## 2014-01-10 ENCOUNTER — Encounter: Payer: Medicare Other | Attending: Internal Medicine | Admitting: *Deleted

## 2014-01-10 DIAGNOSIS — Z794 Long term (current) use of insulin: Secondary | ICD-10-CM | POA: Diagnosis not present

## 2014-01-10 DIAGNOSIS — E1310 Other specified diabetes mellitus with ketoacidosis without coma: Secondary | ICD-10-CM | POA: Diagnosis present

## 2014-01-10 DIAGNOSIS — Z713 Dietary counseling and surveillance: Secondary | ICD-10-CM | POA: Insufficient documentation

## 2014-01-10 NOTE — Progress Notes (Signed)
Introduction to Insulin Pump Therapy:  Appt start time: 1030 end time:  1130.  Assessment:  This patient has DM 1 and their primary concerns today: Patient wants to learn more about pump therapy.  This patient is interested in learning more about insulin pump therapy because she wants better control and fewer shots each day  MEDICATIONS: Basal Insulin: 12 units of Lantus at 6 units twice daily via pen  Bolus Insulin: 15 units of Novolog at 4-6 units each meal  via pen  Total of insulin doses per day 30 Other diabetes medications: none  Patient does currently have Ketone Strips  This patient is currently adjusting bolus insulin based BG  This patient is  currently adjusting bolus insulin based on carb intake   Patient states knowledge of Carb Counting is fair  Progress Towards Obtaining an Insulin PumpGoal(s):  In progress.  Patient states their expectations of pump therapy include: better BG control to help prevent complications Patient expresses understanding that for improved outcomes for their diabetes on an insulin pump they will:  Check BG 4-6 times per day  Change out pump infusion set at least every 3 days  Upload pump information to software on a regular basis so provider can assess patterns and make setting adjustments.     Intervention:    Taught difference between delivery of insulin via syringe/pen compared to insulin pump.  Demonstrated improved insulin delivery via pump due to improved accuracy of dose and flexibility of adjusting bolus insulin based on carb intake and BG correction.  Demonstrated pump, insulin reservoir and infusion set options and button pushing for bolus delivery of insulin through the pump including Medtronic, Animas and Tandem pumps  Explained importance of testing BG at least 4 times per day for appropriate correction of high BG and prevention of DKA as applicable.  Emphasized importance of follow up after Pump Start for appropriate pump  setting adjustments and on-going training on more advanced features.  Handouts given during visit include:  Intro to Pumping handout   Monitoring/Evaluation:    Patient does want to continue with pursuit of insulin pump and prefers Tandem  Patient agrees for me to notify Tandem Rep to start the ordering process  Patient to notify this office when pump is shipped so training appointment can be scheduled

## 2014-01-21 ENCOUNTER — Encounter: Payer: Self-pay | Admitting: Family Medicine

## 2014-01-22 MED ORDER — SUMATRIPTAN SUCCINATE 100 MG PO TABS: 100.0000 mg | ORAL_TABLET | ORAL | Status: DC | PRN

## 2014-01-22 MED ORDER — LOSARTAN POTASSIUM-HCTZ 50-12.5 MG PO TABS: 1.0000 | ORAL_TABLET | Freq: Every day | ORAL | Status: DC

## 2014-01-22 MED ORDER — TOPIRAMATE 25 MG PO TABS: 25.0000 mg | ORAL_TABLET | Freq: Every day | ORAL | Status: DC

## 2014-01-24 MED ORDER — MELOXICAM 15 MG PO TABS: 15.0000 mg | ORAL_TABLET | Freq: Every morning | ORAL | Status: DC

## 2014-01-29 ENCOUNTER — Other Ambulatory Visit: Payer: Self-pay | Admitting: Family Medicine

## 2014-01-29 ENCOUNTER — Encounter: Payer: Self-pay | Admitting: Family Medicine

## 2014-01-30 MED ORDER — TRAMADOL HCL 50 MG PO TABS: 100.0000 mg | ORAL_TABLET | Freq: Four times a day (QID) | ORAL | Status: DC | PRN

## 2014-01-30 NOTE — Telephone Encounter (Signed)
Call in #[phone] with 5 rf 

## 2014-02-20 ENCOUNTER — Other Ambulatory Visit: Payer: Self-pay

## 2014-02-20 DIAGNOSIS — Z1231 Encounter for screening mammogram for malignant neoplasm of breast: Secondary | ICD-10-CM

## 2014-02-26 ENCOUNTER — Ambulatory Visit (INDEPENDENT_AMBULATORY_CARE_PROVIDER_SITE_OTHER): Payer: Medicare Other | Admitting: Family Medicine

## 2014-02-26 ENCOUNTER — Ambulatory Visit: Payer: Medicare Other | Admitting: Family Medicine

## 2014-02-26 ENCOUNTER — Encounter: Payer: Self-pay | Admitting: Family Medicine

## 2014-02-26 VITALS — BP 100/67 | HR 71 | Temp 98.4°F | Ht 65.0 in | Wt 182.0 lb

## 2014-02-26 DIAGNOSIS — S39012A Strain of muscle, fascia and tendon of lower back, initial encounter: Secondary | ICD-10-CM

## 2014-02-26 DIAGNOSIS — I10 Essential (primary) hypertension: Secondary | ICD-10-CM

## 2014-02-26 DIAGNOSIS — R42 Dizziness and giddiness: Secondary | ICD-10-CM

## 2014-02-26 DIAGNOSIS — M797 Fibromyalgia: Secondary | ICD-10-CM

## 2014-02-26 MED ORDER — MELOXICAM 15 MG PO TABS: 15.0000 mg | ORAL_TABLET | Freq: Every morning | ORAL | Status: DC

## 2014-02-26 MED ORDER — HYDROCODONE-ACETAMINOPHEN 10-325 MG PO TABS: 1.0000 | ORAL_TABLET | Freq: Four times a day (QID) | ORAL | Status: DC | PRN

## 2014-02-26 MED ORDER — ALPRAZOLAM 1 MG PO TABS: 1.0000 mg | ORAL_TABLET | Freq: Every evening | ORAL | Status: DC | PRN

## 2014-02-26 MED ORDER — ATORVASTATIN CALCIUM 10 MG PO TABS
10.0000 mg | ORAL_TABLET | Freq: Every day | ORAL | Status: DC
Start: 2014-02-26 — End: 2015-02-07

## 2014-02-26 MED ORDER — SUMATRIPTAN SUCCINATE 100 MG PO TABS: 100.0000 mg | ORAL_TABLET | ORAL | Status: DC | PRN

## 2014-02-26 MED ORDER — LOSARTAN POTASSIUM-HCTZ 50-12.5 MG PO TABS: 1.0000 | ORAL_TABLET | Freq: Every day | ORAL | Status: DC

## 2014-02-26 MED ORDER — MECLIZINE HCL 25 MG PO TABS: 25.0000 mg | ORAL_TABLET | ORAL | Status: DC | PRN

## 2014-02-26 MED ORDER — TRAMADOL HCL 50 MG PO TABS: 100.0000 mg | ORAL_TABLET | Freq: Four times a day (QID) | ORAL | Status: DC | PRN

## 2014-02-26 MED ORDER — CYCLOBENZAPRINE HCL 10 MG PO TABS: ORAL_TABLET | ORAL | Status: DC

## 2014-02-26 NOTE — Progress Notes (Signed)
   Subjective:    Patient ID: Katie Palmer, female    DOB: 1950-06-12, 64 y.o.   MRN: [id number]  HPI Here for refills and advice. She is supposed to get her insulin pump per Dr. Buddy Duty tomorrow and she is quite excited about this. She has changed pharmacies and needs all her meds sent the new one. 2 days she hurt her lower back moving a piece of furniture and nopw she has pain in the center of the lower back. No pain in the legs. Using ice and heat. She already takes Meloxicam and Flexeril daily.    Review of Systems  Constitutional: Negative.   Respiratory: Negative.   Cardiovascular: Negative.   Musculoskeletal: Positive for back pain.       Objective:   Physical Exam  Constitutional: She appears well-developed and well-nourished.  In mild pain   Cardiovascular: Normal rate, regular rhythm, normal heart sounds and intact distal pulses.   Pulmonary/Chest: Effort normal and breath sounds normal.  Musculoskeletal:  Tender in the lower back with a lot of spasm. Full ROM           Assessment & Plan:  meds were refilled. Use Norco for pain until her back heals up.

## 2014-02-26 NOTE — Progress Notes (Signed)
Pre visit review using our clinic review tool, if applicable. No additional management support is needed unless otherwise documented below in the visit note. 

## 2014-02-27 ENCOUNTER — Ambulatory Visit: Payer: Medicare Other | Admitting: *Deleted

## 2014-02-27 ENCOUNTER — Encounter: Payer: Self-pay | Admitting: Family Medicine

## 2014-02-27 NOTE — Telephone Encounter (Signed)
noted 

## 2014-03-06 ENCOUNTER — Ambulatory Visit: Payer: Medicare Other | Admitting: *Deleted

## 2014-03-12 ENCOUNTER — Ambulatory Visit: Payer: Medicare Other

## 2014-03-14 ENCOUNTER — Ambulatory Visit
Admission: RE | Admit: 2014-03-14 | Discharge: 2014-03-14 | Disposition: A | Discharge status: Home / Self Care | Payer: Medicare Other | Source: Ambulatory Visit

## 2014-03-14 DIAGNOSIS — Z1231 Encounter for screening mammogram for malignant neoplasm of breast: Secondary | ICD-10-CM

## 2014-03-21 ENCOUNTER — Telehealth: Payer: Self-pay | Admitting: *Deleted

## 2014-03-21 ENCOUNTER — Ambulatory Visit: Payer: Medicare Other | Admitting: *Deleted

## 2014-03-21 NOTE — Telephone Encounter (Signed)
  Pump Follow Up Progress Note 03/21/14 via telephone  Orders received from MD  giving me permission to make insulin pump adjustments for this patient.  Reviewed blood glucose logs on 03/21/14 via: [employer] and found the following:  Average BG over past 2 weeks: 263 mg/dl +/- 73 mg/dl Her post meal BG's are going well above 40 mg/dl  Her correction doses are not bringing her to her Target Range.   Comments: I re-evaluated her Insulin Carb Ratio and Insulin Sensitivity Factor based on her current Total Daily Dose of insulin being @ 32 units/day and increased her ICR and ISF per below:  Pump Settings: Date: Current Date: 03/21/14  Changes in bold    Basal Rate: Carb Ratio Sensitivity  Basal Rate: Carb Ratio Sensitivity   MN: 0.550 22 65 MN: 0.550 15 (+) 50 (+)                                                         Plan: Patient to continue to bolus for meals and high BG with new settings.  Patient to contact me if her BG goes below 70 or above 300 mg/dl Plan to re-evaluate within 2 weeks as needed.   Follow up: Plan to reschedule the follow up visit within the next 2 weeks with me.

## 2014-03-26 ENCOUNTER — Encounter: Payer: Self-pay | Admitting: *Deleted

## 2014-04-09 ENCOUNTER — Other Ambulatory Visit: Payer: Self-pay | Admitting: Family Medicine

## 2014-04-12 ENCOUNTER — Emergency Department (HOSPITAL_COMMUNITY): Payer: Medicare Other

## 2014-04-12 ENCOUNTER — Inpatient Hospital Stay (HOSPITAL_COMMUNITY)
Admission: EM | Admit: 2014-04-12 | Discharge: 2014-04-14 | DRG: 638 | Disposition: A | Discharge status: Home / Self Care | Payer: Medicare Other | Attending: Internal Medicine | Admitting: Internal Medicine

## 2014-04-12 ENCOUNTER — Encounter (HOSPITAL_COMMUNITY): Payer: Self-pay | Admitting: Radiology

## 2014-04-12 DIAGNOSIS — E1169 Type 2 diabetes mellitus with other specified complication: Secondary | ICD-10-CM

## 2014-04-12 DIAGNOSIS — E860 Dehydration: Secondary | ICD-10-CM | POA: Diagnosis present

## 2014-04-12 DIAGNOSIS — N179 Acute kidney failure, unspecified: Secondary | ICD-10-CM | POA: Diagnosis present

## 2014-04-12 DIAGNOSIS — G8929 Other chronic pain: Secondary | ICD-10-CM | POA: Diagnosis present

## 2014-04-12 DIAGNOSIS — Z9641 Presence of insulin pump (external) (internal): Secondary | ICD-10-CM | POA: Diagnosis present

## 2014-04-12 DIAGNOSIS — F329 Major depressive disorder, single episode, unspecified: Secondary | ICD-10-CM | POA: Diagnosis present

## 2014-04-12 DIAGNOSIS — E785 Hyperlipidemia, unspecified: Secondary | ICD-10-CM | POA: Diagnosis present

## 2014-04-12 DIAGNOSIS — Z794 Long term (current) use of insulin: Secondary | ICD-10-CM | POA: Diagnosis not present

## 2014-04-12 DIAGNOSIS — E1110 Type 2 diabetes mellitus with ketoacidosis without coma: Secondary | ICD-10-CM

## 2014-04-12 DIAGNOSIS — E875 Hyperkalemia: Secondary | ICD-10-CM | POA: Diagnosis present

## 2014-04-12 DIAGNOSIS — E1310 Other specified diabetes mellitus with ketoacidosis without coma: Principal | ICD-10-CM | POA: Diagnosis present

## 2014-04-12 DIAGNOSIS — Z86010 Personal history of colonic polyps: Secondary | ICD-10-CM

## 2014-04-12 DIAGNOSIS — R079 Chest pain, unspecified: Secondary | ICD-10-CM

## 2014-04-12 DIAGNOSIS — M797 Fibromyalgia: Secondary | ICD-10-CM | POA: Diagnosis present

## 2014-04-12 DIAGNOSIS — E43 Unspecified severe protein-calorie malnutrition: Secondary | ICD-10-CM | POA: Diagnosis present

## 2014-04-12 DIAGNOSIS — Z888 Allergy status to other drugs, medicaments and biological substances status: Secondary | ICD-10-CM

## 2014-04-12 DIAGNOSIS — E0810 Diabetes mellitus due to underlying condition with ketoacidosis without coma: Secondary | ICD-10-CM

## 2014-04-12 DIAGNOSIS — K219 Gastro-esophageal reflux disease without esophagitis: Secondary | ICD-10-CM | POA: Diagnosis present

## 2014-04-12 DIAGNOSIS — I10 Essential (primary) hypertension: Secondary | ICD-10-CM

## 2014-04-12 LAB — GLUCOSE, CAPILLARY
GLUCOSE-CAPILLARY: 293 mg/dL — AB (ref 70–99)
GLUCOSE-CAPILLARY: 152 mg/dL — AB (ref 70–99)
Glucose-Capillary: 324 mg/dL — ABNORMAL HIGH (ref 70–99)
Glucose-Capillary: 211 mg/dL — ABNORMAL HIGH (ref 70–99)
Glucose-Capillary: 130 mg/dL — ABNORMAL HIGH (ref 70–99)
Glucose-Capillary: 150 mg/dL — ABNORMAL HIGH (ref 70–99)
Glucose-Capillary: 195 mg/dL — ABNORMAL HIGH (ref 70–99)

## 2014-04-12 LAB — BASIC METABOLIC PANEL
Anion gap: 9 (ref 5–15)
Anion gap: 7 (ref 5–15)
BUN: 33 mg/dL — ABNORMAL HIGH (ref 6–23)
BUN: 32 mg/dL — AB (ref 6–23)
CHLORIDE: 111 mmol/L (ref 96–112)
CHLORIDE: 107 mmol/L (ref 96–112)
CO2: 19 mmol/L (ref 19–32)
CO2: 23 mmol/L (ref 19–32)
CREATININE: 1.41 mg/dL — AB (ref 0.50–1.10)
Calcium: 8.5 mg/dL (ref 8.4–10.5)
Calcium: 8.4 mg/dL (ref 8.4–10.5)
Creatinine, Ser: 1.18 mg/dL — ABNORMAL HIGH (ref 0.50–1.10)
GFR calc Af Amer: 44 mL/min — ABNORMAL LOW (ref >90)
GFR calc non Af Amer: 38 mL/min — ABNORMAL LOW (ref >90)
GFR, EST AFRICAN AMERICAN: 55 mL/min — AB (ref >90)
GFR, EST NON AFRICAN AMERICAN: 47 mL/min — AB (ref >90)
GLUCOSE: 198 mg/dL — AB (ref 70–99)
GLUCOSE: 197 mg/dL — AB (ref 70–99)
POTASSIUM: 4.5 mmol/L (ref 3.5–5.1)
Potassium: 4.5 mmol/L (ref 3.5–5.1)
SODIUM: 139 mmol/L (ref 135–145)
Sodium: 137 mmol/L (ref 135–145)

## 2014-04-12 LAB — CBC WITH DIFFERENTIAL/PLATELET
BASOS PCT: 0 % (ref 0–1)
Basophils Absolute: 0 10*3/uL (ref 0.0–0.1)
EOS PCT: 1 % (ref 0–5)
Eosinophils Absolute: 0.1 10*3/uL (ref 0.0–0.7)
HEMATOCRIT: 43.9 % (ref 36.0–46.0)
HEMOGLOBIN: 14.6 g/dL (ref 12.0–15.0)
Lymphocytes Relative: 13 % (ref 12–46)
Lymphs Abs: 2.2 10*3/uL (ref 0.7–4.0)
MCH: 30 pg (ref 26.0–34.0)
MCHC: 33.3 g/dL (ref 30.0–36.0)
MCV: 90.1 fL (ref 78.0–100.0)
MONOS PCT: 4 % (ref 3–12)
Monocytes Absolute: 0.6 10*3/uL (ref 0.1–1.0)
Neutro Abs: 13.9 10*3/uL — ABNORMAL HIGH (ref 1.7–7.7)
Neutrophils Relative %: 82 % — ABNORMAL HIGH (ref 43–77)
PLATELETS: 221 10*3/uL (ref 150–400)
RBC: 4.87 MIL/uL (ref 3.87–5.11)
RDW: 13 % (ref 11.5–15.5)
WBC: 16.9 10*3/uL — ABNORMAL HIGH (ref 4.0–10.5)

## 2014-04-12 LAB — URINALYSIS, ROUTINE W REFLEX MICROSCOPIC
Bilirubin Urine: NEGATIVE
Glucose, UA: >1000 mg/dL — AB
Hgb urine dipstick: NEGATIVE
Ketones, ur: >80 mg/dL — AB
Leukocytes, UA: NEGATIVE
Nitrite: NEGATIVE
PH: 5 (ref 5.0–8.0)
Protein, ur: NEGATIVE mg/dL
Specific Gravity, Urine: 1.023 (ref 1.005–1.030)
UROBILINOGEN UA: 0.2 mg/dL (ref 0.0–1.0)

## 2014-04-12 LAB — CBG MONITORING, ED
Glucose-Capillary: 425 mg/dL — ABNORMAL HIGH (ref 70–99)
Glucose-Capillary: 403 mg/dL — ABNORMAL HIGH (ref 70–99)
Glucose-Capillary: 352 mg/dL — ABNORMAL HIGH (ref 70–99)

## 2014-04-12 LAB — URINE MICROSCOPIC-ADD ON

## 2014-04-12 LAB — I-STAT VENOUS BLOOD GAS, ED
Acid-base deficit: 19 mmol/L — ABNORMAL HIGH (ref 0.0–2.0)
Bicarbonate: 10.5 mEq/L — ABNORMAL LOW (ref 20.0–24.0)
O2 Saturation: 76 %
TCO2: 12 mmol/L (ref 0–100)
pCO2, Ven: 37.3 mmHg — ABNORMAL LOW (ref 45.0–50.0)
pH, Ven: 7.06 — CL (ref 7.250–7.300)
pO2, Ven: 57 mmHg — ABNORMAL HIGH (ref 30.0–45.0)

## 2014-04-12 LAB — I-STAT ARTERIAL BLOOD GAS, ED
ACID-BASE DEFICIT: 20 mmol/L — AB (ref 0.0–2.0)
Bicarbonate: 8.8 mEq/L — ABNORMAL LOW (ref 20.0–24.0)
O2 Saturation: 96 %
PCO2 ART: 30.6 mmHg — AB (ref 35.0–45.0)
Patient temperature: 98.6
TCO2: 10 mmol/L (ref 0–100)
pH, Arterial: 7.068 — CL (ref 7.350–7.450)
pO2, Arterial: 110 mmHg — ABNORMAL HIGH (ref 80.0–100.0)

## 2014-04-12 LAB — COMPREHENSIVE METABOLIC PANEL
ALT: 20 U/L (ref 0–35)
AST: 21 U/L (ref 0–37)
Albumin: 3.8 g/dL (ref 3.5–5.2)
Alkaline Phosphatase: 93 U/L (ref 39–117)
Anion gap: 21 — ABNORMAL HIGH (ref 5–15)
BUN: 35 mg/dL — ABNORMAL HIGH (ref 6–23)
CALCIUM: 9.3 mg/dL (ref 8.4–10.5)
CO2: 12 mmol/L — ABNORMAL LOW (ref 19–32)
CREATININE: 1.46 mg/dL — AB (ref 0.50–1.10)
Chloride: 99 mmol/L (ref 96–112)
GFR calc Af Amer: 42 mL/min — ABNORMAL LOW (ref >90)
GFR, EST NON AFRICAN AMERICAN: 37 mL/min — AB (ref >90)
GLUCOSE: 485 mg/dL — AB (ref 70–99)
POTASSIUM: 5.6 mmol/L — AB (ref 3.5–5.1)
Sodium: 132 mmol/L — ABNORMAL LOW (ref 135–145)
TOTAL PROTEIN: 6.4 g/dL (ref 6.0–8.3)
Total Bilirubin: 1.6 mg/dL — ABNORMAL HIGH (ref 0.3–1.2)

## 2014-04-12 LAB — MRSA PCR SCREENING: MRSA by PCR: NEGATIVE

## 2014-04-12 LAB — CK: Total CK: 36 U/L (ref 7–177)

## 2014-04-12 LAB — LIPASE, BLOOD: LIPASE: 15 U/L (ref 11–59)

## 2014-04-12 LAB — TROPONIN I

## 2014-04-12 LAB — INFLUENZA PANEL BY PCR (TYPE A & B)
H1N1FLUPCR: NOT DETECTED
INFLAPCR: NEGATIVE
Influenza B By PCR: NEGATIVE

## 2014-04-12 MED ORDER — HYDROMORPHONE HCL 1 MG/ML IJ SOLN
1.0000 mg | Freq: Once | INTRAMUSCULAR | Status: AC
  Administered 2014-04-12: 1 mg via INTRAVENOUS
  Filled 2014-04-12: qty 1

## 2014-04-12 MED ORDER — ATORVASTATIN CALCIUM 10 MG PO TABS
10.0000 mg | ORAL_TABLET | Freq: Every day | ORAL | Status: DC
  Administered 2014-04-12 – 2014-04-13 (×2): 10 mg via ORAL
  Filled 2014-04-12 (×2): qty 1

## 2014-04-12 MED ORDER — PANTOPRAZOLE SODIUM 40 MG PO TBEC
40.0000 mg | DELAYED_RELEASE_TABLET | Freq: Every day | ORAL | Status: DC
  Administered 2014-04-12 – 2014-04-14 (×3): 40 mg via ORAL
  Filled 2014-04-12 (×3): qty 1

## 2014-04-12 MED ORDER — SODIUM CHLORIDE 0.9 % IV SOLN: INTRAVENOUS | Status: DC

## 2014-04-12 MED ORDER — INSULIN GLARGINE 100 UNIT/ML ~~LOC~~ SOLN
10.0000 [IU] | Freq: Every day | SUBCUTANEOUS | Status: DC
  Filled 2014-04-12: qty 0.1

## 2014-04-12 MED ORDER — SODIUM CHLORIDE 0.9 % IV BOLUS (SEPSIS)
1000.0000 mL | Freq: Once | INTRAVENOUS | Status: AC
  Administered 2014-04-12: 1000 mL via INTRAVENOUS

## 2014-04-12 MED ORDER — DEXTROSE-NACL 5-0.45 % IV SOLN
INTRAVENOUS | Status: DC
  Administered 2014-04-12: 20:00:00 via INTRAVENOUS

## 2014-04-12 MED ORDER — SUMATRIPTAN SUCCINATE 100 MG PO TABS
100.0000 mg | ORAL_TABLET | ORAL | Status: DC | PRN
  Administered 2014-04-13 (×2): 100 mg via ORAL
  Filled 2014-04-12 (×5): qty 1

## 2014-04-12 MED ORDER — ACETAMINOPHEN 325 MG PO TABS
650.0000 mg | ORAL_TABLET | Freq: Once | ORAL | Status: AC
  Administered 2014-04-12: 650 mg via ORAL
  Filled 2014-04-12: qty 2

## 2014-04-12 MED ORDER — ONDANSETRON HCL 4 MG/2ML IJ SOLN: 4.0000 mg | Freq: Four times a day (QID) | INTRAMUSCULAR | Status: DC | PRN

## 2014-04-12 MED ORDER — ONDANSETRON HCL 4 MG/2ML IJ SOLN
4.0000 mg | Freq: Once | INTRAMUSCULAR | Status: AC
  Administered 2014-04-12: 4 mg via INTRAVENOUS
  Filled 2014-04-12: qty 2

## 2014-04-12 MED ORDER — LORAZEPAM 2 MG/ML IJ SOLN
1.0000 mg | Freq: Once | INTRAMUSCULAR | Status: AC
  Administered 2014-04-12: 1 mg via INTRAVENOUS
  Filled 2014-04-12: qty 1

## 2014-04-12 MED ORDER — SODIUM CHLORIDE 0.9 % IV SOLN
INTRAVENOUS | Status: DC
  Filled 2014-04-12: qty 2.5

## 2014-04-12 MED ORDER — CYCLOBENZAPRINE HCL 5 MG PO TABS: 5.0000 mg | ORAL_TABLET | Freq: Three times a day (TID) | ORAL | Status: DC | PRN

## 2014-04-12 MED ORDER — ALBUTEROL SULFATE (2.5 MG/3ML) 0.083% IN NEBU: 2.5000 mg | INHALATION_SOLUTION | RESPIRATORY_TRACT | Status: DC | PRN

## 2014-04-12 MED ORDER — DEXTROSE-NACL 5-0.45 % IV SOLN: INTRAVENOUS | Status: DC

## 2014-04-12 MED ORDER — ENOXAPARIN SODIUM 40 MG/0.4ML ~~LOC~~ SOLN
40.0000 mg | SUBCUTANEOUS | Status: DC
  Administered 2014-04-13 (×2): 40 mg via SUBCUTANEOUS
  Filled 2014-04-12 (×4): qty 0.4

## 2014-04-12 MED ORDER — GI COCKTAIL ~~LOC~~
30.0000 mL | Freq: Three times a day (TID) | ORAL | Status: DC | PRN
  Filled 2014-04-12: qty 30

## 2014-04-12 MED ORDER — TOPIRAMATE 25 MG PO TABS
25.0000 mg | ORAL_TABLET | Freq: Every day | ORAL | Status: DC | PRN
  Administered 2014-04-13: 25 mg via ORAL
  Filled 2014-04-12 (×4): qty 1

## 2014-04-12 MED ORDER — TRAMADOL HCL 50 MG PO TABS
100.0000 mg | ORAL_TABLET | Freq: Four times a day (QID) | ORAL | Status: DC | PRN
  Administered 2014-04-12 – 2014-04-13 (×3): 100 mg via ORAL
  Filled 2014-04-12 (×3): qty 2

## 2014-04-12 MED ORDER — SODIUM CHLORIDE 0.9 % IV SOLN
INTRAVENOUS | Status: DC
  Administered 2014-04-12: 13:00:00 via INTRAVENOUS

## 2014-04-12 MED ORDER — SODIUM CHLORIDE 0.9 % IV SOLN
INTRAVENOUS | Status: DC
  Administered 2014-04-12: 3.7 [IU]/h via INTRAVENOUS
  Administered 2014-04-12: 10.6 [IU]/h via INTRAVENOUS
  Filled 2014-04-12: qty 2.5

## 2014-04-12 MED ORDER — ALPRAZOLAM 0.5 MG PO TABS
1.0000 mg | ORAL_TABLET | Freq: Every evening | ORAL | Status: DC | PRN
  Administered 2014-04-12 – 2014-04-13 (×2): 1 mg via ORAL
  Filled 2014-04-12 (×2): qty 2

## 2014-04-12 NOTE — ED Provider Notes (Signed)
CSN: [id number]     Arrival date & time 04/12/14  8616 History   First MD Initiated Contact with Patient 04/12/14 0932     No chief complaint on file.    (Consider location/radiation/quality/duration/timing/severity/associated sxs/prior Treatment) HPI Comments: Patient here with 2 days of diffuse body pain worse between her shoulders with associated high blood sugars at home. Blood sugars have been as high as 600 and she has been using her home insulin pump to treat this. Does have a history of DKA and this is slightly similar. Patient also has a history of myalgia and has been using her tramadol without relief. Denies any fevers or chills. Did have emesis 1. Denies any anginal type chest pain. She's not short of breath no fever noted. Symptoms persistent and nothing makes them better or worse. Called EMS and was given aspirin and nitroglycerin without relief of her symptoms.  The history is provided by the patient and the spouse.    Past Medical History  Diagnosis Date  . Hx of colonic polyps   . Depression   . Headache(784.0)   . Hypertension   . Insomnia   . Fibromyalgia   . Diabetes mellitus without complication   . Vertigo    Past Surgical History  Procedure Laterality Date  . Pulmonary stress test      Normal on 02/04/09. Sees Dr. Philis Pique for GYN exams  . Tonsillectomy    . Colonoscopy  Feb. 2012    per GI in [place], clear, repeat in 5 yrs   . Diagnostic mammogram      01/27/2008. normal   Family History  Problem Relation Age of Onset  . Alcohol abuse Father     Died age 32  . Leukemia Brother     Died age 73  . Heart disease Father   . Aneurysm Mother     Died age 2  . COPD Sister   . Rheum arthritis Sister   . Heart failure Sister    History  Substance Use Topics  . Smoking status: Never Smoker   . Smokeless tobacco: Never Used  . Alcohol Use: No   OB History    No data available     Review of Systems  All other systems reviewed and are  negative.     Allergies  Cymbalta and Lasix  Home Medications   Prior to Admission medications   Medication Sig Start Date End Date Taking? Authorizing Provider  ACCU-CHEK SOFTCLIX LANCETS lancets Use as instructed 02/24/13  Yes Philemon Kingdom, MD  ALPRAZolam Duanne Moron) 1 MG tablet Take 1 tablet (1 mg total) by mouth at bedtime as needed for sleep. 02/26/14 02/26/15 Yes Laurey Morale, MD  atorvastatin (LIPITOR) 10 MG tablet Take 1 tablet (10 mg total) by mouth daily. 02/26/14  Yes Laurey Morale, MD  cyclobenzaprine (FLEXERIL) 10 MG tablet TAKE 1 TABLET BY MOUTH 3 TIMES DAILY AS NEEDED. Patient taking differently: TAKE 1 TABLET BY MOUTH 3 TIMES DAILY AS NEEDED FOR MUSCLE SPASM 02/26/14  Yes Laurey Morale, MD  glucose blood test strip Use 3x a day - AccuCheck nano 02/24/13  Yes Philemon Kingdom, MD  meclizine (ANTIVERT) 25 MG tablet Take 1 tablet (25 mg total) by mouth every 4 (four) hours as needed for dizziness. 02/26/14  Yes Laurey Morale, MD  ondansetron (ZOFRAN ODT) 4 MG disintegrating tablet Take 1 tablet (4 mg total) by mouth every 8 (eight) hours as needed for nausea. 03/15/13  Yes Laurey Morale,  MD  SUMAtriptan (IMITREX) 100 MG tablet Take 1 tablet (100 mg total) by mouth as needed for migraine or headache. May repeat in 2 hours if headache persists or recurs. 02/26/14  Yes Laurey Morale, MD  Aspirin-Salicylamide-Caffeine [facility] HEADACHE POWDER PO) Take 1 each by mouth as needed (headaches).    Historical Provider, MD  HYDROcodone-acetaminophen (NORCO) 10-325 MG per tablet Take 1 tablet by mouth every 6 (six) hours as needed for severe pain. 02/26/14   Laurey Morale, MD  insulin aspart (NOVOLOG) 100 UNIT/ML injection Use this scale for bedtime coverage only: CBG < 70: Drink juice;       CBG 70 -200 (dose in units): 0 units;  CBG 201 - 250: 2 units; CBG 251 - 300: 3 units; CBG 301 - 350: 4 units; CBG 351 - 400: 5 units;CBG > 400 Call MD Patient not taking: Reported on 04/12/2014 07/30/13   Venetia Maxon Rama,  MD  Insulin Glargine (LANTUS) 100 UNIT/ML Solostar Pen Inject 12 Units into the skin at bedtime.  08/10/13   Philemon Kingdom, MD  Insulin Pen Needle (CAREFINE PEN NEEDLES) 32G X 5 MM MISC Use once a day 06/05/13   Philemon Kingdom, MD  Insulin Pen Needle (ULTILET PEN NEEDLE) 32G X 4 MM MISC Use to inject insulin 4 times daily. Dx code: 249.11 08/10/13   Philemon Kingdom, MD  losartan-hydrochlorothiazide (HYZAAR) 50-12.5 MG per tablet Take 1 tablet by mouth daily. 02/26/14   Laurey Morale, MD  meloxicam (MOBIC) 15 MG tablet Take 1 tablet (15 mg total) by mouth every morning. Patient not taking: Reported on 04/12/2014 02/26/14   Laurey Morale, MD  traMADol (ULTRAM) 50 MG tablet Take 2 tablets (100 mg total) by mouth every 6 (six) hours as needed. 02/26/14   Laurey Morale, MD   BP 145/71 mmHg  Pulse 89  Temp(Src) 97.8 F (36.6 C) (Oral)  Resp 22  Ht 5\' 4"  (1.626 m)  Wt 180 lb (81.647 kg)  BMI 30.88 kg/m2  SpO2 100% Physical Exam  Constitutional: She is oriented to person, place, and time. She appears well-developed and well-nourished.  Non-toxic appearance. No distress.  HENT:  Head: Normocephalic and atraumatic.  Eyes: Conjunctivae, EOM and lids are normal. Pupils are equal, round, and reactive to light.  Neck: Normal range of motion. Neck supple. No tracheal deviation present. No thyroid mass present.  Cardiovascular: Normal rate, regular rhythm and normal heart sounds.  Exam reveals no gallop.   No murmur heard. Pulmonary/Chest: Effort normal and breath sounds normal. No stridor. No respiratory distress. She has no decreased breath sounds. She has no wheezes. She has no rhonchi. She has no rales.  Abdominal: Soft. Normal appearance and bowel sounds are normal. She exhibits no distension. There is no tenderness. There is no rebound and no CVA tenderness.  Musculoskeletal: Normal range of motion. She exhibits no edema or tenderness.  Neurological: She is alert and oriented to person, place, and  time. She has normal strength. No cranial nerve deficit or sensory deficit. GCS eye subscore is 4. GCS verbal subscore is 5. GCS motor subscore is 6.  Skin: Skin is warm and dry. No abrasion and no rash noted.  Psychiatric: She has a normal mood and affect. Her speech is normal and behavior is normal.  Nursing note and vitals reviewed.   ED Course  Procedures (including critical care time) Labs Review Labs Reviewed  TROPONIN I  CBC WITH DIFFERENTIAL/PLATELET  COMPREHENSIVE METABOLIC PANEL  LIPASE, BLOOD  URINALYSIS, ROUTINE W REFLEX MICROSCOPIC  BLOOD GAS, VENOUS    Imaging Review No results found.   EKG Interpretation   Date/Time:  Thursday April 12 2014 09:32:23 EDT Ventricular Rate:  78 PR Interval:  158 QRS Duration: 97 QT Interval:  394 QTC Calculation: 449 R Axis:   -65 Text Interpretation:  Sinus rhythm Abnormal R-wave progression, late  transition Inferior infarct, old Confirmed by Braylin Formby  MD, Kasten Leveque ([id number])  on 04/12/2014 9:51:39 AM      MDM   Final diagnoses:  Chest pain     CRITICAL CARE Performed by: Leota Jacobsen Total critical care time: 50 Critical care time was exclusive of separately billable procedures and treating other patients. Critical care was necessary to treat or prevent imminent or life-threatening deterioration. Critical care was time spent personally by me on the following activities: development of treatment plan with patient and/or surrogate as well as nursing, discussions with consultants, evaluation of patient's response to treatment, examination of patient, obtaining history from patient or surrogate, ordering and performing treatments and interventions, ordering and review of laboratory studies, ordering and review of radiographic studies, pulse oximetry and re-evaluation of patient's condition.  Patient given IV fluids here and placed on glucose stabilizer. Given medications for pain and feels better now. Discussed with  hospitalist and patient will be admitted    Lacretia Leigh, MD 04/12/14 1301

## 2014-04-12 NOTE — Progress Notes (Signed)
Inpatient Diabetes Program Recommendations  AACE/ADA: New Consensus Statement on Inpatient Glycemic Control (2013)  Target Ranges:  Prepandial:   less than 140 mg/dL      Peak postprandial:   less than 180 mg/dL (1-2 hours)      Critically ill patients:  140 - 180 mg/dL     Results for SANDHYA, DENHERDER (MRN [id number]) as of 04/12/2014 15:59  Ref. Range 04/12/2014 11:18  Sodium Latest Range: 135-145 mmol/L 132 (L)  Potassium Latest Range: 3.5-5.1 mmol/L 5.6 (H)  Chloride Latest Range: 96-112 mmol/L 99  CO2 Latest Range: 19-32 mmol/L 12 (L)  BUN Latest Range: 6-23 mg/dL 35 (H)  Creatinine Latest Range: 0.50-1.10 mg/dL 1.46 (H)  Calcium Latest Range: 8.4-10.5 mg/dL 9.3  GFR calc non Af Amer Latest Range: >90 mL/min 37 (L)  GFR calc Af Amer Latest Range: >90 mL/min 42 (L)  Glucose Latest Range: 70-99 mg/dL 485 (H)  Anion gap Latest Range: 5-15  21 (H)    Chief Complaint: DKA  History: DM, HTN  Home DM Meds: T-slim Insulin Pump (see below for settings as of 03/21/14 from note from CDE at the [facility] and DM management center)  Current DM Orders: IV insulin drip per GlucoStabilizer (DKA order set)    **Spoke with patient down in the ED this afternoon.  Patient told me her sugars have been running high at home.  Changed her insulin pump reservoir/set/site yesterday (03/16) and sugars remained high (per patient, her sugars were running high before she changed her reservoir/set/site yesterday).  Per patient, does not have any symptoms of illness or infection that she knows of.  **Per patient, she spoke with her insulin pump trainer (Bev Paddock) on 03/21/14.  A few insulin pump changes were made to her insulin pump settings with her trainer by phone without incident.  Patient's Endocrinologist is Dr. Minette Brine with [facility].  **Explained to patient that we are treating her for DKA and explained how we are treating her (IVF, IV insulin drip, electrolyte replacement  as needed, etc).  Patient agreeable to all treatments thus far.  Explained to patient that when her labs have improved and her acidosis has cleared that the MD will likely allow her to resume her insulin pump.  Explained to patient that when this happens, patient will need to use all new supplies (reservoir/ insulin/ set/ site, etc) as this is hospital policy to use all new supplies when a patient is admitted with DKA.  Patient agreeable and stated she can have her son bring extra pump supplies.  Also instructed patient to call the 1-800# on the back of her pump to the manufacturer to run a safety check with her pump to assess for any problems.  If there is a problem with her insulin pump, patient will need to transition to Lantus and Novolog instead of her insulin pump.  Patient aware and agreeable.   Per Chart Review, patient's insulin pump settings are as follows (as of 03/21/14)--------  Basal settings: 12AM- 12AM: 0.550 units/hour  Patient gets a total of 13.2 units basal insulin per 24 hour period  Carbohydrate Ratio: 1 unit for every 15 grams of carbohydrates consumed  Correction/Sensitivity Factor:  1 unit for every 50 mg/dl above target CBG    MD- If patient performs safety check with her insulin pump and the pump is deemed safe, would have patient resume her insulin pump when her labs have improved.  Recommend overlap of IV insulin drip and Insulin pump  by one hour (have patient resume pump with all new supplies and turn IV insulin drip off one hour after pump restarted)  If pump is not safe to use, would transition patient off IV insulin drip to Lantus and Novolog- Lantus 13 units daily + Novolog Sensitive SSI + 4 units Novolog meal coverage     Will follow Wyn Quaker RN, MSN, CDE Diabetes Coordinator Inpatient Diabetes Program Team Pager: [phone] (8a-5p)

## 2014-04-12 NOTE — ED Notes (Signed)
Phlebotomy at bedside.

## 2014-04-12 NOTE — ED Notes (Signed)
Lab results given to [place].

## 2014-04-12 NOTE — ED Notes (Addendum)
Pt presents with generalized pain located in her legs, shoulder blades and Chest X 08000. Per EMS cbg is 300. ASA 324 mg and nitor sublingual X 1. Condition is acute in nature. Condition is made better by nothing. Condition is made worse by nothing. Pt has an appointment with her endocrinologist on Tuesday.

## 2014-04-12 NOTE — ED Notes (Signed)
Pt knows that urine is needed 

## 2014-04-12 NOTE — H&P (Signed)
Patient Demographics  Katie Palmer, is a 64 y.o. female  MRN: [id number]   DOB - 1950-09-05  Admit Date - 04/12/2014  Outpatient Primary MD for the patient is Laurey Morale, MD   With History of -  Past Medical History  Diagnosis Date  . Hx of colonic polyps   . Depression   . Headache(784.0)   . Hypertension   . Insomnia   . Fibromyalgia   . Diabetes mellitus without complication   . Vertigo       Past Surgical History  Procedure Laterality Date  . Pulmonary stress test      Normal on 02/04/09. Sees Dr. Philis Pique for GYN exams  . Tonsillectomy    . Colonoscopy  Feb. 2012    per GI in [place], clear, repeat in 5 yrs   . Diagnostic mammogram      01/27/2008. normal    in for   No chief complaint on file.    HPI  Katie Palmer  is a 64 y.o. female,  With Hx of DM 2 recently placed on Insulin Pump, HTN, Dyslipidemia, GERD, fibromyalgia, depression who is been following with Dr. Buddy Duty endocrinologist in [place] and was recently placed on insulin pump about 4 weeks ago comes in with chief complaints of generalized weakness and body aches going on for about 4 days, she has also noticed her sugars have been running high in mid 4-600 range for the same time.  In the ER workup consistent with DKA, acute renal failure, dehydration, hyperkalemia. I was called to admit the patient. Patient denies any fever or chills, no headache, no chest pain cough and shortness of breath, no abdominal pain or discomfort, she does have chronic reflux, no diarrhea, no dysuria, no blood in stool or urine. No focal weakness. Does have fibromyalgia with chronic pains and a chest but for the last 4 days has been having some generalized aches.    Review of Systems    In addition to the HPI above,   No Fever-chills, No  Headache, No changes with Vision or hearing, No problems swallowing food or Liquids, No Chest pain, Cough or Shortness of Breath, No Abdominal pain, Chronic reflux, No Nausea or Vommitting, Bowel movements are regular, No Blood in stool or Urine, No dysuria, No new skin rashes or bruises, No new joints pains-aches, some bodyaches No new weakness, tingling, numbness in any extremity, No recent weight gain or loss, No polyuria, polydypsia or polyphagia, No significant Mental Stressors.  A full 10 point Review of Systems was done, except as stated above, all other Review of Systems were negative.   Social History History  Substance Use Topics  . Smoking status: Never Smoker   . Smokeless tobacco: Never Used  . Alcohol Use: No     Family History Family History  Problem Relation Age of Onset  . Alcohol abuse Father     Died age 31  .  Leukemia Brother     Died age 36  . Heart disease Father   . Aneurysm Mother     Died age 78  . COPD Sister   . Rheum arthritis Sister   . Heart failure Sister       Prior to Admission medications   Medication Sig Start Date End Date Taking? Authorizing Provider  ACCU-CHEK SOFTCLIX LANCETS lancets Use as instructed 02/24/13  Yes Philemon Kingdom, MD  ALPRAZolam Duanne Moron) 1 MG tablet Take 1 tablet (1 mg total) by mouth at bedtime as needed for sleep. 02/26/14 02/26/15 Yes Laurey Morale, MD  Aspirin-Salicylamide-Caffeine [facility] HEADACHE POWDER PO) Take 1 each by mouth as needed (headaches).   Yes Historical Provider, MD  atorvastatin (LIPITOR) 10 MG tablet Take 1 tablet (10 mg total) by mouth daily. 02/26/14  Yes Laurey Morale, MD  cyclobenzaprine (FLEXERIL) 10 MG tablet TAKE 1 TABLET BY MOUTH 3 TIMES DAILY AS NEEDED. Patient taking differently: TAKE 1 TABLET BY MOUTH 3 TIMES DAILY AS NEEDED FOR MUSCLE SPASM 02/26/14  Yes Laurey Morale, MD  GLUCAGON EMERGENCY 1 MG injection Use as directed for severe low blood sugar 03/08/14  Yes Historical Provider, MD    glucose blood test strip Use 3x a day - AccuCheck nano 02/24/13  Yes Philemon Kingdom, MD  losartan-hydrochlorothiazide (HYZAAR) 50-12.5 MG per tablet Take 1 tablet by mouth daily. 02/26/14  Yes Laurey Morale, MD  meclizine (ANTIVERT) 25 MG tablet Take 1 tablet (25 mg total) by mouth every 4 (four) hours as needed for dizziness. 02/26/14  Yes Laurey Morale, MD  ondansetron (ZOFRAN ODT) 4 MG disintegrating tablet Take 1 tablet (4 mg total) by mouth every 8 (eight) hours as needed for nausea. 03/15/13  Yes Laurey Morale, MD  SUMAtriptan (IMITREX) 100 MG tablet Take 1 tablet (100 mg total) by mouth as needed for migraine or headache. May repeat in 2 hours if headache persists or recurs. 02/26/14  Yes Laurey Morale, MD  topiramate (TOPAMAX) 25 MG tablet Take 25 mg by mouth daily as needed (migraines).  01/10/14  Yes Historical Provider, MD  traMADol (ULTRAM) 50 MG tablet Take 2 tablets (100 mg total) by mouth every 6 (six) hours as needed. 02/26/14  Yes Laurey Morale, MD  HYDROcodone-acetaminophen [facility]) 10-325 MG per tablet Take 1 tablet by mouth every 6 (six) hours as needed for severe pain. Patient not taking: Reported on 04/12/2014 02/26/14   Laurey Morale, MD  insulin aspart (NOVOLOG) 100 UNIT/ML injection Use this scale for bedtime coverage only: CBG < 70: Drink juice;       CBG 70 -200 (dose in units): 0 units;  CBG 201 - 250: 2 units; CBG 251 - 300: 3 units; CBG 301 - 350: 4 units; CBG 351 - 400: 5 units;CBG > 400 Call MD Patient not taking: Reported on 04/12/2014 07/30/13   Venetia Maxon Rama, MD  Insulin Pen Needle (CAREFINE PEN NEEDLES) 32G X 5 MM MISC Use once a day Patient not taking: Reported on 04/12/2014 06/05/13   Philemon Kingdom, MD  Insulin Pen Needle (ULTILET PEN NEEDLE) 32G X 4 MM MISC Use to inject insulin 4 times daily. Dx code: 249.11 Patient not taking: Reported on 04/12/2014 08/10/13   Philemon Kingdom, MD  meloxicam (MOBIC) 15 MG tablet Take 1 tablet (15 mg total) by mouth every  morning. Patient not taking: Reported on 04/12/2014 02/26/14   Laurey Morale, MD    Allergies  Allergen Reactions  .  Cymbalta [Duloxetine Hcl]     confusion  . Lasix [Furosemide]     Severe diarrhea    Physical Exam  Vitals  Blood pressure 118/79, pulse 106, temperature 97.8 F (36.6 C), temperature source Oral, resp. rate 20, height 5\' 4"  (1.626 m), weight 81.647 kg (180 lb), SpO2 96 %.   1. General middle-aged white female lying in bed in NAD, but appears dehydrated and fatigued  2. Normal affect and insight, Not Suicidal or Homicidal, Awake Alert, Oriented X 3.  3. No F.N deficits, ALL C.Nerves Intact, Strength 5/5 all 4 extremities, Sensation intact all 4 extremities, Plantars down going.  4. Ears and Eyes appear Normal, Conjunctivae clear, PERRLA. Moist Oral Mucosa.  5. Supple Neck, No JVD, No cervical lymphadenopathy appriciated, No Carotid Bruits.  6. Symmetrical Chest wall movement, Good air movement bilaterally, CTAB.  7. RRR, No Gallops, Rubs or Murmurs, No Parasternal Heave.  8. Positive Bowel Sounds, Abdomen Soft, No tenderness, No organomegaly appriciated,No rebound -guarding or rigidity.  9.  No Cyanosis, Normal Skin Turgor, No Skin Rash or Bruise.  10. Good muscle tone,  joints appear normal , no effusions, Normal ROM.  11. No Palpable Lymph Nodes in Neck or Axillae     Data Review  CBC  Recent Labs Lab 04/12/14 1118  WBC 16.9*  HGB 14.6  HCT 43.9  PLT 221  MCV 90.1  MCH 30.0  MCHC 33.3  RDW 13.0  LYMPHSABS 2.2  MONOABS 0.6  EOSABS 0.1  BASOSABS 0.0   ------------------------------------------------------------------------------------------------------------------  Chemistries   Recent Labs Lab 04/12/14 1118  NA 132*  K 5.6*  CL 99  CO2 12*  GLUCOSE 485*  BUN 35*  CREATININE 1.46*  CALCIUM 9.3  AST 21  ALT 20  ALKPHOS 93  BILITOT 1.6*    ------------------------------------------------------------------------------------------------------------------ estimated creatinine clearance is 39.7 mL/min (by C-G formula based on Cr of 1.46). ------------------------------------------------------------------------------------------------------------------ No results for input(s): TSH, T4TOTAL, T3FREE, THYROIDAB in the last 72 hours.  Invalid input(s): FREET3   Coagulation profile No results for input(s): INR, PROTIME in the last 168 hours. ------------------------------------------------------------------------------------------------------------------- No results for input(s): DDIMER in the last 72 hours. -------------------------------------------------------------------------------------------------------------------  Cardiac Enzymes  Recent Labs Lab 04/12/14 1118  TROPONINI <0.03   ------------------------------------------------------------------------------------------------------------------ Invalid input(s): POCBNP   ---------------------------------------------------------------------------------------------------------------  Urinalysis    Component Value Date/Time   COLORURINE YELLOW 04/12/2014 1145   APPEARANCEUR CLEAR 04/12/2014 1145   LABSPEC 1.023 04/12/2014 1145   PHURINE 5.0 04/12/2014 1145   GLUCOSEU >1000* 04/12/2014 1145   HGBUR NEGATIVE 04/12/2014 1145   HGBUR negative 02/06/2009 0915   BILIRUBINUR NEGATIVE 04/12/2014 1145   BILIRUBINUR 1+ 11/29/2012 1255   KETONESUR >80* 04/12/2014 1145   PROTEINUR NEGATIVE 04/12/2014 1145   PROTEINUR n 11/29/2012 1255   UROBILINOGEN 0.2 04/12/2014 1145   UROBILINOGEN 1.0 11/29/2012 1255   NITRITE NEGATIVE 04/12/2014 1145   NITRITE n 11/29/2012 1255   LEUKOCYTESUR NEGATIVE 04/12/2014 1145    ----------------------------------------------------------------------------------------------------------------  Imaging results:   Dg Chest 2  View  04/12/2014   CLINICAL DATA:  Chest pain body aches for 3 days  EXAM: CHEST  2 VIEW  COMPARISON:  07/28/2013  FINDINGS: Normal heart size and mediastinal contours. No acute infiltrate or edema. No effusion or pneumothorax. No acute osseous findings.  IMPRESSION: No active cardiopulmonary disease.   Electronically Signed   By: Monte Fantasia M.D.   On: 04/12/2014 10:54    My personal review of EKG: Rhythm NSR, Rate  78 /min,   no Acute  ST changes    Assessment & Plan   1. DKA in a patient with type 2 diabetes mellitus. Likely due to insulin pump failure. Will be admitted to stepdown unit, IV fluid bolus followed by maintenance, IV insulin drip per protocol for DKA, check A1c, we'll request diabetic educator to evaluate pump. Monitor electrolytes and replace as needed.    2. Dehydration with acute renal failure. Due to #1 above hydrate, hold ACE inhibitor, repeat BMP in the morning.mildly elevated potassium we will hydrate and monitor. This is likely due to DKA.    3. Fibromyalgia. Home medications will be continued, does have some body aches likely due to #1, rule out influenza, check CK and she is on statin.   4. Dyslipidemia. On statin. If CK levels are elevated we can discontinue statins.   5. GERD. Place on PPI along with GI cocktail as needed.   6. Essential hypertension. Blood pressure soft, hydrate hold blood pressure medications.      DVT Prophylaxis    Lovenox    AM Labs Ordered, also please review Full Orders  Family Communication: Admission, patients condition and plan of care including tests being ordered have been discussed with the patient  who indicates understanding and agree with the plan and Code Status.  Code Status Full  Likely DC to  Home  Condition Fair  Time spent in minutes : 35    SINGH,PRASHANT K M.D on 04/12/2014 at 1:08 PM  Between 7am to 7pm - Pager - [phone]  After 7pm go to [email or web address] - password [facility]  [facility]   Office  [phone]

## 2014-04-13 DIAGNOSIS — E860 Dehydration: Secondary | ICD-10-CM

## 2014-04-13 LAB — GLUCOSE, CAPILLARY
GLUCOSE-CAPILLARY: 100 mg/dL — AB (ref 70–99)
GLUCOSE-CAPILLARY: 131 mg/dL — AB (ref 70–99)
GLUCOSE-CAPILLARY: 147 mg/dL — AB (ref 70–99)
GLUCOSE-CAPILLARY: 166 mg/dL — AB (ref 70–99)
GLUCOSE-CAPILLARY: 280 mg/dL — AB (ref 70–99)
GLUCOSE-CAPILLARY: 367 mg/dL — AB (ref 70–99)
GLUCOSE-CAPILLARY: 459 mg/dL — AB (ref 70–99)
GLUCOSE-CAPILLARY: 71 mg/dL (ref 70–99)
Glucose-Capillary: 449 mg/dL — ABNORMAL HIGH (ref 70–99)
Glucose-Capillary: 499 mg/dL — ABNORMAL HIGH (ref 70–99)

## 2014-04-13 LAB — BASIC METABOLIC PANEL
ANION GAP: 6 (ref 5–15)
BUN: 23 mg/dL (ref 6–23)
CO2: 20 mmol/L (ref 19–32)
Calcium: 8.4 mg/dL (ref 8.4–10.5)
Chloride: 108 mmol/L (ref 96–112)
Creatinine, Ser: 1.17 mg/dL — ABNORMAL HIGH (ref 0.50–1.10)
GFR calc Af Amer: 55 mL/min — ABNORMAL LOW (ref >90)
GFR, EST NON AFRICAN AMERICAN: 48 mL/min — AB (ref >90)
Glucose, Bld: 415 mg/dL — ABNORMAL HIGH (ref 70–99)
Potassium: 4.2 mmol/L (ref 3.5–5.1)
SODIUM: 134 mmol/L — AB (ref 135–145)

## 2014-04-13 LAB — HEMOGLOBIN A1C
Hgb A1c MFr Bld: 8.7 % — ABNORMAL HIGH (ref 4.8–5.6)
Mean Plasma Glucose: 203 mg/dL

## 2014-04-13 LAB — MAGNESIUM: MAGNESIUM: 2 mg/dL (ref 1.5–2.5)

## 2014-04-13 MED ORDER — INSULIN PUMP
Freq: Three times a day (TID) | SUBCUTANEOUS | Status: DC
  Administered 2014-04-13: 3.91 via SUBCUTANEOUS
  Administered 2014-04-13 (×2): via SUBCUTANEOUS
  Administered 2014-04-14: 3.35 via SUBCUTANEOUS
  Filled 2014-04-13: qty 1

## 2014-04-13 MED ORDER — INSULIN ASPART 100 UNIT/ML ~~LOC~~ SOLN
4.0000 [IU] | Freq: Three times a day (TID) | SUBCUTANEOUS | Status: DC
  Administered 2014-04-13: 4 [IU] via SUBCUTANEOUS

## 2014-04-13 MED ORDER — TRAMADOL HCL 50 MG PO TABS
100.0000 mg | ORAL_TABLET | Freq: Four times a day (QID) | ORAL | Status: DC
  Administered 2014-04-13 – 2014-04-14 (×4): 100 mg via ORAL
  Filled 2014-04-13 (×4): qty 2

## 2014-04-13 MED ORDER — TRAMADOL HCL 50 MG PO TABS
100.0000 mg | ORAL_TABLET | Freq: Four times a day (QID) | ORAL | Status: DC
Start: 2014-04-13 — End: 2014-04-13

## 2014-04-13 MED ORDER — INSULIN ASPART 100 UNIT/ML ~~LOC~~ SOLN
0.0000 [IU] | Freq: Three times a day (TID) | SUBCUTANEOUS | Status: DC
  Administered 2014-04-13: 1 [IU] via SUBCUTANEOUS

## 2014-04-13 MED ORDER — INSULIN GLARGINE 100 UNIT/ML ~~LOC~~ SOLN
13.0000 [IU] | Freq: Every day | SUBCUTANEOUS | Status: DC
  Administered 2014-04-13: 13 [IU] via SUBCUTANEOUS
  Filled 2014-04-13 (×2): qty 0.13

## 2014-04-13 NOTE — Progress Notes (Signed)
Saw patient this am to verify insulin pump safety check. The insulin pump is working effectively. Paged MD for resumption. Since patient had basal insulin last pm, patient will need her basal settings on hold till 1 am.  Will contact [facility] pump rep to set pumps basal rates accordingly. Patient is alert and oriented and has pump supplies at bedside. Will run by patients room to see her place pump and make sure her basal settings are correct.  RN will have patient sign pump contract. Insulin pump assessment to be charted in flow sheets Qshift.  Thanks,  Tama Headings RN, MSN, [facility] Inpatient Diabetes Coordinator Team Pager [phone]

## 2014-04-13 NOTE — Progress Notes (Signed)
[facility] TEAM 1 - Stepdown/ICU TEAM Progress Note  Katie Palmer BPZ:[id number] DOB: August 11, 1950 DOA: 04/12/2014 PCP: Laurey Morale, MD  Admit HPI / Brief Narrative: 64 y.o. F with Hx of DM 2 recently placed on Insulin Pump, HTN, Dyslipidemia, GERD, fibromyalgia, and depression who is being followed by Dr. Buddy Duty endocrinologist in [place].  She was placed on an insulin pump about 4 weeks prior, and came in with a chief complaint of generalized weakness and body aches for 4 days.  She noticed her sugars had been running in mid 4-600 range for the same time.  In the ER workup was consistent with DKA, acute renal failure, dehydration, hyperkalemia.   HPI/Subjective: Pt feels much better today.  She has not new complaints, and has a good appetite.  She agrees w/ resuming her insulin pump.  She denies cp, sob, n/v, or abdom cramps.    Assessment/Plan:  DKA in uncontrolled DM2 on insulin pump Now off insulin gtt - insulin pump checked per DM coordinator and confirmed to be in good working order - resume insulin pump while still inpt and follow CBGs over night (basal not to begin until 1AM as pt given lantus)  Dehydration w/ acute renal failure ACE on hold - volume and renal fxn improving w/ hydration - cont to follow  HTN BP currently well controlled - follow w/o change today   HLD  Cont lipitor   Fibromyalgia On home meds  GERD Cont home tx regimen  Code Status: FULL Family Communication: no family present at time of exam Disposition Plan: transfer to medical bed - watch CBG w/ resumption of insulin pump for 24hrs - watch CO2/anion gap - possible d/c home 3/19   Consultants: None   Procedures: none  Antibiotics: none  DVT prophylaxis: lovenox  Objective: Blood pressure 110/65, pulse 84, temperature 98 F (36.7 C), temperature source Oral, resp. rate 21, height 5\' 4"  (1.626 m), weight 80.7 kg (177 lb 14.6 oz), SpO2 97 %.  Intake/Output Summary (Last 24 hours) at  04/13/14 1131 Last data filed at 04/13/14 0754  Gross per 24 hour  Intake 358.63 ml  Output   1500 ml  Net -1141.37 ml   Exam: General: No acute respiratory distress Lungs: Clear to auscultation bilaterally without wheezes or crackles Cardiovascular: Regular rate and rhythm without murmur gallop or rub normal S1 and S2 Abdomen: Nontender, nondistended, soft, bowel sounds positive, no rebound, no ascites, no appreciable mass Extremities: No significant cyanosis, clubbing, or edema bilateral lower extremities  Data Reviewed: Basic Metabolic Panel:  Recent Labs Lab 04/12/14 1118 04/12/14 1828 04/12/14 2222 04/13/14 0244  NA 132* 139 137  --   K 5.6* 4.5 4.5  --   CL 99 111 107  --   CO2 12* 19 23  --   GLUCOSE 485* 198* 197*  --   BUN 35* 33* 32*  --   CREATININE 1.46* 1.41* 1.18*  --   CALCIUM 9.3 8.5 8.4  --   MG  --   --   --  2.0    Liver Function Tests:  Recent Labs Lab 04/12/14 1118  AST 21  ALT 20  ALKPHOS 93  BILITOT 1.6*  PROT 6.4  ALBUMIN 3.8    Recent Labs Lab 04/12/14 1118  LIPASE 15   CBC:  Recent Labs Lab 04/12/14 1118  WBC 16.9*  NEUTROABS 13.9*  HGB 14.6  HCT 43.9  MCV 90.1  PLT 221   CBG:  Recent Labs Lab 04/13/14 0011  04/13/14 0110 04/13/14 0208 04/13/14 0310 04/13/14 0753  GLUCAP 166* 131* 100* 71 147*    Recent Results (from the past 240 hour(s))  MRSA PCR Screening     Status: None   Collection Time: 04/12/14  5:01 PM  Result Value Ref Range Status   MRSA by PCR NEGATIVE NEGATIVE Final    Comment:        The GeneXpert MRSA Assay (FDA approved for NASAL specimens only), is one component of a comprehensive MRSA colonization surveillance program. It is not intended to diagnose MRSA infection nor to guide or monitor treatment for MRSA infections.      Studies:  Recent x-ray studies have been reviewed in detail by the Attending Physician  Scheduled Meds:  Scheduled Meds: . atorvastatin  10 mg Oral q1800    . enoxaparin (LOVENOX) injection  40 mg Subcutaneous Q24H  . insulin pump   Subcutaneous TID AC, HS, 0200  . pantoprazole  40 mg Oral Daily  . traMADol  100 mg Oral 4 times per day    Time spent on care of this patient: 35 mins   Arline Ketter T , MD   [facility] Office  [phone] Pager - Text Page per Shea Evans as per below:  On-Call/Text Page:      [email or web address]      password [email or web address]  If 7PM-7AM, please contact night-coverage [email or web address] Password [email or web address] 04/13/2014, 11:31 AM   LOS: 1 day

## 2014-04-13 NOTE — Progress Notes (Signed)
Utilization Review Completed.  

## 2014-04-14 LAB — BASIC METABOLIC PANEL
ANION GAP: 4 — AB (ref 5–15)
BUN: 17 mg/dL (ref 6–23)
CO2: 24 mmol/L (ref 19–32)
Calcium: 8.6 mg/dL (ref 8.4–10.5)
Chloride: 109 mmol/L (ref 96–112)
Creatinine, Ser: 0.84 mg/dL (ref 0.50–1.10)
GFR calc Af Amer: 83 mL/min — ABNORMAL LOW (ref >90)
GFR, EST NON AFRICAN AMERICAN: 71 mL/min — AB (ref >90)
GLUCOSE: 170 mg/dL — AB (ref 70–99)
POTASSIUM: 3.8 mmol/L (ref 3.5–5.1)
SODIUM: 137 mmol/L (ref 135–145)

## 2014-04-14 LAB — GLUCOSE, CAPILLARY
Glucose-Capillary: 283 mg/dL — ABNORMAL HIGH (ref 70–99)
Glucose-Capillary: 61 mg/dL — ABNORMAL LOW (ref 70–99)
Glucose-Capillary: 156 mg/dL — ABNORMAL HIGH (ref 70–99)
Glucose-Capillary: 143 mg/dL — ABNORMAL HIGH (ref 70–99)

## 2014-04-14 MED ORDER — INSULIN PUMP: SUBCUTANEOUS | Status: DC

## 2014-04-14 NOTE — Discharge Summary (Signed)
DISCHARGE SUMMARY  Katie Palmer  MR#: [id number]  DOB:1950/03/16  Date of Admission: 04/12/2014 Date of Discharge: 04/14/2014  Attending Physician:MCCLUNG,JEFFREY T  Patient's PCP:FRY,STEPHEN A, MD  Consults:  None   Disposition: D/C home   Follow-up Appts:     Follow-up Information    Follow up with KERR,JEFFREY, MD On 04/17/2014.   Specialty:  Endocrinology   Why:  Keep your previously scheduled appointment.     Contact information:   [address] [place] [address] [phone]      Tests Needing Follow-up: -monitoring of CBG trends on insulin pump will be required   Discharge Diagnoses: DKA in uncontrolled DM2 on insulin pump Dehydration w/ acute renal failure HTN HLD  Fibromyalgia GERD  Initial presentation: 64 y.o. F with Hx of DM 2 recently placed on Insulin Pump, HTN, Dyslipidemia, GERD, fibromyalgia, and depression who is being followed by Dr. Buddy Duty endocrinologist in [place]. She was placed on an insulin pump about 4 weeks prior, and came in with a chief complaint of generalized weakness and body aches for 4 days. She noticed her sugars had been running in mid 4-600 range for the same time.  In the ER workup was consistent with DKA, acute renal failure, dehydration, hyperkalemia.   Hospital Course:  DKA in uncontrolled DM2 on insulin pump Rapidly weaned from insulin gtt, initially on to lantus insulin - insulin pump checked per DM coordinator and confirmed to be in good working order - resumed insulin pump while still inpt and followed CBGs over night (basal not to begin until 1AM as pt given lantus) - once lantus metabolized and insulin pump resumed (1AM 3/19) CBG remained well controlled - one episode of hypoglycemia was encountered at 07:49 on 3/19 (CBG 61), was tx w/ OJ, and f/u CBG at 08:34 was 156 - the pt felt comfortable w/ operating her pump, and was highly motivated to d/c home - a subsequent CBG confirmed stability at 143  and the pt was cleared for d/c home - she is scheduled to f/u w/ her Endocrinologist on Tuesday of next week, and knows to call his on-call partner as needed for questions over the weekend - she is advised to report immediately to the ED however should she encounter severe hypoglycemia or uncontrolled hyperglycemia   Dehydration w/ acute renal failure ACE was placed on hold initially - volume and renal fxn normalized w/ hydration - resume usual med tx upon d/c   HTN BP well controlled th/o hospital stay    HLD  Cont lipitor   Fibromyalgia On home meds - well controlled w/ no acute issues during hospitalization   GERD Cont home tx regimen    Medication List    STOP taking these medications        HYDROcodone-acetaminophen 10-325 MG per tablet  Commonly known as:  NORCO     insulin aspart 100 UNIT/ML injection  Commonly known as:  novoLOG     Insulin Pen Needle 32G X 4 MM Misc  Commonly known as:  ULTILET PEN NEEDLE     Insulin Pen Needle 32G X 5 MM Misc  Commonly known as:  CAREFINE PEN NEEDLES     meloxicam 15 MG tablet  Commonly known as:  MOBIC      TAKE these medications        ACCU-CHEK SOFTCLIX LANCETS lancets  Use as instructed     ALPRAZolam 1 MG tablet  Commonly known as:  XANAX  Take 1 tablet (1  mg total) by mouth at bedtime as needed for sleep.     atorvastatin 10 MG tablet  Commonly known as:  LIPITOR  Take 1 tablet (10 mg total) by mouth daily.     BC HEADACHE POWDER PO  Take 1 each by mouth as needed (headaches).     cyclobenzaprine 10 MG tablet  Commonly known as:  FLEXERIL  TAKE 1 TABLET BY MOUTH 3 TIMES DAILY AS NEEDED.     GLUCAGON EMERGENCY 1 MG injection  Generic drug:  glucagon  Use as directed for severe low blood sugar     glucose blood test strip  Use 3x a day - AccuCheck nano     insulin pump Soln  Continue use of your insulin pump as instructed by Dr. Buddy Duty     losartan-hydrochlorothiazide 50-12.5 MG per tablet  Commonly  known as:  HYZAAR  Take 1 tablet by mouth daily.     meclizine 25 MG tablet  Commonly known as:  ANTIVERT  Take 1 tablet (25 mg total) by mouth every 4 (four) hours as needed for dizziness.     ondansetron 4 MG disintegrating tablet  Commonly known as:  ZOFRAN ODT  Take 1 tablet (4 mg total) by mouth every 8 (eight) hours as needed for nausea.     SUMAtriptan 100 MG tablet  Commonly known as:  IMITREX  Take 1 tablet (100 mg total) by mouth as needed for migraine or headache. May repeat in 2 hours if headache persists or recurs.     topiramate 25 MG tablet  Commonly known as:  TOPAMAX  Take 25 mg by mouth daily as needed (migraines).     traMADol 50 MG tablet  Commonly known as:  ULTRAM  Take 2 tablets (100 mg total) by mouth every 6 (six) hours as needed.       Day of Discharge BP 106/63 mmHg  Pulse 64  Temp(Src) 97.5 F (36.4 C) (Oral)  Resp 18  Ht 5\' 4"  (1.626 m)  Wt 80.7 kg (177 lb 14.6 oz)  BMI 30.52 kg/m2  SpO2 94%  Physical Exam: General: No acute respiratory distress Lungs: Clear to auscultation bilaterally without wheezes or crackles Cardiovascular: Regular rate and rhythm without murmur gallop or rub normal S1 and S2 Abdomen: Nontender, nondistended, soft, bowel sounds positive, no rebound, no ascites, no appreciable mass Extremities: No significant cyanosis, clubbing, or edema bilateral lower extremities  Basic Metabolic Panel:  Recent Labs Lab 04/12/14 1118 04/12/14 1828 04/12/14 2222 04/13/14 0244 04/13/14 1617 04/14/14 0407  NA 132* 139 137  --  134* 137  K 5.6* 4.5 4.5  --  4.2 3.8  CL 99 111 107  --  108 109  CO2 12* 19 23  --  20 24  GLUCOSE 485* 198* 197*  --  415* 170*  BUN 35* 33* 32*  --  23 17  CREATININE 1.46* 1.41* 1.18*  --  1.17* 0.84  CALCIUM 9.3 8.5 8.4  --  8.4 8.6  MG  --   --   --  2.0  --   --    Liver Function Tests:  Recent Labs Lab 04/12/14 1118  AST 21  ALT 20  ALKPHOS 93  BILITOT 1.6*  PROT 6.4  ALBUMIN 3.8      Recent Labs Lab 04/12/14 1118  LIPASE 15   CBC:  Recent Labs Lab 04/12/14 1118  WBC 16.9*  NEUTROABS 13.9*  HGB 14.6  HCT 43.9  MCV 90.1  PLT  221   CBG:  Recent Labs Lab 04/13/14 2120 04/13/14 2215 04/14/14 0158 04/14/14 0749 04/14/14 0834  GLUCAP 449* 499* 283* 61* 156*    Recent Results (from the past 240 hour(s))  MRSA PCR Screening     Status: None   Collection Time: 04/12/14  5:01 PM  Result Value Ref Range Status   MRSA by PCR NEGATIVE NEGATIVE Final    Comment:        The GeneXpert MRSA Assay (FDA approved for NASAL specimens only), is one component of a comprehensive MRSA colonization surveillance program. It is not intended to diagnose MRSA infection nor to guide or monitor treatment for MRSA infections.       Time spent in discharge (includes decision making & examination of pt): >35 minutes  04/14/2014, 10:30 AM   Cherene Altes, MD [facility] Office  [phone] Pager [phone]  On-Call/Text Page:      [email or web address]      password [facility]

## 2014-04-14 NOTE — Discharge Instructions (Signed)
Diabetic Ketoacidosis °Diabetic ketoacidosis (DKA) is a life-threatening complication of type 1 diabetes. It must be quickly recognized and treated. Treatment requires hospitalization. °CAUSES  °When there is no insulin in the body, glucose (sugar) cannot be used, and the body breaks down fat for energy. When fat breaks down, acids (ketones) build up in the blood. Very high levels of glucose and high levels of acids lead to severe loss of body fluids (dehydration) and other dangerous chemical changes. This stresses your vital organs and can cause coma or death. °SIGNS AND SYMPTOMS  °· Tiredness (fatigue). °· Weight loss. °· Excessive thirst. °· Ketones in your urine. °· Light-headedness. °· Fruity or sweet smelling breath. °· Excessive urination. °· Visual changes. °· Confusion or irritability. °· Nausea or vomiting. °· Rapid breathing. °· Stomachache or abdominal pain. °DIAGNOSIS  °Your health care provider will diagnose DKA based on your history, physical exam, and blood tests. The health care provider will check to see if you have another illness that caused you to go into DKA. Most of this will be done quickly in an emergency room. °TREATMENT  °· Fluid replacement to correct dehydration. °· Insulin. °· Correction of electrolytes, such as potassium and sodium. °· Antibiotic medicines. °PREVENTION °· Always take your insulin. Do not skip your insulin injections. °· If you are sick, treat yourself quickly. Your body often needs more insulin to fight the illness. °· Check your blood glucose regularly. °· Check urine ketones if your blood glucose is greater than 240 milligrams per deciliter (mg/dL). °· Do not use outdated (expired) insulin. °· If your blood glucose is high, drink plenty of fluids. This helps flush out ketones. °HOME CARE INSTRUCTIONS  °· If you are sick, follow the advice of your health care provider. °· To prevent dehydration, drink enough water and fluids to keep your urine clear or pale  yellow. °¨ If you cannot eat, alternate between drinking fluids with sugar (soda, juices, flavored gelatin) and salty fluids (broth, bouillon). °¨ If you can eat, follow your usual diet and drink sugar-free liquids (water, diet drinks). °· Always take your usual dose of insulin. If you cannot eat or if your glucose is getting too low, call your health care provider for further instructions. °· Continue to monitor your blood or urine ketones every 3-4 hours around the clock. Set your alarm clock or have someone wake you up. If you are too sick, have someone test it for you. °· Rest and avoid exercise. °SEEK MEDICAL CARE IF:  °· You have a fever. °· You have ketones in your urine, or your blood glucose is higher than a level your health care provider suggests. You may need extra insulin. Call your health care provider if you need advice on adjusting your insulin. °· You cannot drink at least a tablespoon (15 mL) of fluid every 15-20 minutes. °· You have been vomiting for more than 2 hours. °· You have symptoms of DKA: °¨ Fruity smelling breath. °¨ Breathing faster or slower. °¨ Becoming very sleepy. °SEEK IMMEDIATE MEDICAL CARE IF:  °· You have signs of dehydration: °¨ Decreased urination. °¨ Increased thirst. °¨ Dry skin and mouth. °¨ Light-headedness. °· Your blood glucose is very high (as advised by your health care provider) twice in a row. °· You faint. °· You have chest pain or trouble breathing. °· You have a sudden, severe headache. °· You have sudden weakness in one arm or one leg. °· You have sudden trouble speaking or swallowing. °· You   have vomiting or diarrhea that is getting worse after 3 hours. °· You have abdominal pain. °MAKE SURE YOU:  °· Understand these instructions. °· Will watch your condition. °· Will get help right away if you are not doing well or get worse. °Document Released: 01/10/2000 Document Revised: 01/17/2013 Document Reviewed: 07/18/2008 °Exit[employer]® Patient Information ©2015 [employer]  [employer]. This information is not intended to replace advice given to you by your health care provider. Make sure you discuss any questions you have with your health care provider. ° °

## 2014-04-14 NOTE — Progress Notes (Signed)
Discharge home. Home discharge instruction given, no questions verbalized. 

## 2014-04-14 NOTE — Progress Notes (Signed)
CRITICAL VALUE ALERT  Critical value received: cbg 61  Date of notification: 04/14/2014  Time of notification: 0960  Critical value read back:y  Nurse who received alert:  Z Nikki Glanzer  MD notified (1st page):  McClung  Time of first page:  0906  MD notified (2nd page):  Time of second page:  Responding MD:  awaiting  Time MD responded:  awaiting

## 2014-04-19 ENCOUNTER — Encounter: Payer: Self-pay | Admitting: Family Medicine

## 2014-04-19 MED ORDER — HYDROCODONE-ACETAMINOPHEN 10-325 MG PO TABS: 1.0000 | ORAL_TABLET | Freq: Four times a day (QID) | ORAL | Status: DC | PRN

## 2014-04-19 NOTE — Telephone Encounter (Signed)
done

## 2014-04-19 NOTE — Telephone Encounter (Signed)
Called and spoke with pt and pt is aware rx ready for pick up.  

## 2014-05-07 ENCOUNTER — Encounter: Payer: Medicare Other | Admitting: Family Medicine

## 2014-05-20 ENCOUNTER — Inpatient Hospital Stay (HOSPITAL_COMMUNITY)
Admission: EM | Admit: 2014-05-20 | Discharge: 2014-05-22 | DRG: 638 | Disposition: A | Discharge status: Home / Self Care | Payer: Medicare Other | Attending: Internal Medicine | Admitting: Internal Medicine

## 2014-05-20 ENCOUNTER — Encounter (HOSPITAL_COMMUNITY): Payer: Self-pay

## 2014-05-20 DIAGNOSIS — F329 Major depressive disorder, single episode, unspecified: Secondary | ICD-10-CM | POA: Diagnosis present

## 2014-05-20 DIAGNOSIS — Z9641 Presence of insulin pump (external) (internal): Secondary | ICD-10-CM | POA: Diagnosis present

## 2014-05-20 DIAGNOSIS — G4700 Insomnia, unspecified: Secondary | ICD-10-CM | POA: Diagnosis present

## 2014-05-20 DIAGNOSIS — M797 Fibromyalgia: Secondary | ICD-10-CM | POA: Diagnosis present

## 2014-05-20 DIAGNOSIS — E860 Dehydration: Secondary | ICD-10-CM | POA: Diagnosis present

## 2014-05-20 DIAGNOSIS — Z79891 Long term (current) use of opiate analgesic: Secondary | ICD-10-CM

## 2014-05-20 DIAGNOSIS — D72829 Elevated white blood cell count, unspecified: Secondary | ICD-10-CM | POA: Diagnosis present

## 2014-05-20 DIAGNOSIS — Z79899 Other long term (current) drug therapy: Secondary | ICD-10-CM

## 2014-05-20 DIAGNOSIS — N179 Acute kidney failure, unspecified: Secondary | ICD-10-CM | POA: Diagnosis present

## 2014-05-20 DIAGNOSIS — E1310 Other specified diabetes mellitus with ketoacidosis without coma: Secondary | ICD-10-CM

## 2014-05-20 DIAGNOSIS — E785 Hyperlipidemia, unspecified: Secondary | ICD-10-CM | POA: Diagnosis present

## 2014-05-20 DIAGNOSIS — Z806 Family history of leukemia: Secondary | ICD-10-CM | POA: Diagnosis not present

## 2014-05-20 DIAGNOSIS — I10 Essential (primary) hypertension: Secondary | ICD-10-CM | POA: Diagnosis present

## 2014-05-20 DIAGNOSIS — E1110 Type 2 diabetes mellitus with ketoacidosis without coma: Secondary | ICD-10-CM | POA: Diagnosis present

## 2014-05-20 DIAGNOSIS — E1010 Type 1 diabetes mellitus with ketoacidosis without coma: Secondary | ICD-10-CM | POA: Diagnosis not present

## 2014-05-20 DIAGNOSIS — Z8249 Family history of ischemic heart disease and other diseases of the circulatory system: Secondary | ICD-10-CM

## 2014-05-20 DIAGNOSIS — F419 Anxiety disorder, unspecified: Secondary | ICD-10-CM | POA: Diagnosis present

## 2014-05-20 DIAGNOSIS — Z794 Long term (current) use of insulin: Secondary | ICD-10-CM | POA: Diagnosis not present

## 2014-05-20 DIAGNOSIS — F32A Depression, unspecified: Secondary | ICD-10-CM | POA: Diagnosis present

## 2014-05-20 DIAGNOSIS — R531 Weakness: Secondary | ICD-10-CM | POA: Diagnosis present

## 2014-05-20 LAB — BLOOD GAS, ARTERIAL
ACID-BASE DEFICIT: 20.3 mmol/L — AB (ref 0.0–2.0)
BICARBONATE: 8 meq/L — AB (ref 20.0–24.0)
FIO2: 0.21 %
O2 SAT: 94.4 %
PATIENT TEMPERATURE: 98.6
PH ART: 7.129 — AB (ref 7.350–7.450)
TCO2: 7.6 mmol/L (ref 0–100)
pCO2 arterial: 25.3 mmHg — ABNORMAL LOW (ref 35.0–45.0)
pO2, Arterial: 102 mmHg — ABNORMAL HIGH (ref 80.0–100.0)

## 2014-05-20 LAB — CBG MONITORING, ED
GLUCOSE-CAPILLARY: 302 mg/dL — AB (ref 70–99)
GLUCOSE-CAPILLARY: 352 mg/dL — AB (ref 70–99)
GLUCOSE-CAPILLARY: 302 mg/dL — AB (ref 70–99)
Glucose-Capillary: 382 mg/dL — ABNORMAL HIGH (ref 70–99)

## 2014-05-20 LAB — CBC WITH DIFFERENTIAL/PLATELET
BASOS ABS: 0.1 10*3/uL (ref 0.0–0.1)
BASOS PCT: 0 % (ref 0–1)
EOS ABS: 0.2 10*3/uL (ref 0.0–0.7)
EOS PCT: 2 % (ref 0–5)
HEMATOCRIT: 45.6 % (ref 36.0–46.0)
Hemoglobin: 15.8 g/dL — ABNORMAL HIGH (ref 12.0–15.0)
LYMPHS PCT: 22 % (ref 12–46)
Lymphs Abs: 2.9 10*3/uL (ref 0.7–4.0)
MCH: 30.8 pg (ref 26.0–34.0)
MCHC: 34.6 g/dL (ref 30.0–36.0)
MCV: 88.9 fL (ref 78.0–100.0)
MONO ABS: 0.4 10*3/uL (ref 0.1–1.0)
Monocytes Relative: 3 % (ref 3–12)
NEUTROS ABS: 9.7 10*3/uL — AB (ref 1.7–7.7)
Neutrophils Relative %: 73 % (ref 43–77)
Platelets: 270 10*3/uL (ref 150–400)
RBC: 5.13 MIL/uL — AB (ref 3.87–5.11)
RDW: 13 % (ref 11.5–15.5)
WBC: 13.3 10*3/uL — AB (ref 4.0–10.5)

## 2014-05-20 LAB — BASIC METABOLIC PANEL
ANION GAP: 8 (ref 5–15)
Anion gap: 16 — ABNORMAL HIGH (ref 5–15)
Anion gap: 5 (ref 5–15)
BUN: 27 mg/dL — ABNORMAL HIGH (ref 6–23)
BUN: 26 mg/dL — ABNORMAL HIGH (ref 6–23)
BUN: 22 mg/dL (ref 6–23)
CALCIUM: 8.5 mg/dL (ref 8.4–10.5)
CALCIUM: 7.9 mg/dL — AB (ref 8.4–10.5)
CHLORIDE: 117 mmol/L — AB (ref 96–112)
CO2: 14 mmol/L — AB (ref 19–32)
CO2: 17 mmol/L — AB (ref 19–32)
CO2: 16 mmol/L — AB (ref 19–32)
CREATININE: 0.82 mg/dL (ref 0.50–1.10)
CREATININE: 0.76 mg/dL (ref 0.50–1.10)
Calcium: 8 mg/dL — ABNORMAL LOW (ref 8.4–10.5)
Chloride: 111 mmol/L (ref 96–112)
Chloride: 114 mmol/L — ABNORMAL HIGH (ref 96–112)
Creatinine, Ser: 1.01 mg/dL (ref 0.50–1.10)
GFR calc Af Amer: 66 mL/min — ABNORMAL LOW (ref >90)
GFR calc non Af Amer: 57 mL/min — ABNORMAL LOW (ref >90)
GFR calc non Af Amer: 87 mL/min — ABNORMAL LOW (ref >90)
GFR, EST AFRICAN AMERICAN: 85 mL/min — AB (ref >90)
GFR, EST NON AFRICAN AMERICAN: 73 mL/min — AB (ref >90)
GLUCOSE: 281 mg/dL — AB (ref 70–99)
Glucose, Bld: 131 mg/dL — ABNORMAL HIGH (ref 70–99)
Glucose, Bld: 159 mg/dL — ABNORMAL HIGH (ref 70–99)
Potassium: 4.3 mmol/L (ref 3.5–5.1)
Potassium: 4.4 mmol/L (ref 3.5–5.1)
Potassium: 4.3 mmol/L (ref 3.5–5.1)
SODIUM: 138 mmol/L (ref 135–145)
Sodium: 141 mmol/L (ref 135–145)
Sodium: 139 mmol/L (ref 135–145)

## 2014-05-20 LAB — PHOSPHORUS: Phosphorus: 4.1 mg/dL (ref 2.3–4.6)

## 2014-05-20 LAB — COMPREHENSIVE METABOLIC PANEL
ALK PHOS: 85 U/L (ref 39–117)
ALT: 19 U/L (ref 0–35)
AST: 23 U/L (ref 0–37)
Albumin: 4.4 g/dL (ref 3.5–5.2)
BUN: 29 mg/dL — ABNORMAL HIGH (ref 6–23)
CALCIUM: 9.2 mg/dL (ref 8.4–10.5)
CHLORIDE: 102 mmol/L (ref 96–112)
CO2: 10 mmol/L — CL (ref 19–32)
CREATININE: 1.11 mg/dL — AB (ref 0.50–1.10)
GFR calc Af Amer: 59 mL/min — ABNORMAL LOW (ref >90)
GFR calc non Af Amer: 51 mL/min — ABNORMAL LOW (ref >90)
Glucose, Bld: 385 mg/dL — ABNORMAL HIGH (ref 70–99)
POTASSIUM: 4.3 mmol/L (ref 3.5–5.1)
Sodium: 135 mmol/L (ref 135–145)
TOTAL PROTEIN: 7.4 g/dL (ref 6.0–8.3)
Total Bilirubin: 1.5 mg/dL — ABNORMAL HIGH (ref 0.3–1.2)

## 2014-05-20 LAB — URINALYSIS, ROUTINE W REFLEX MICROSCOPIC
Bilirubin Urine: NEGATIVE
Glucose, UA: >1000 mg/dL — AB
HGB URINE DIPSTICK: NEGATIVE
LEUKOCYTES UA: NEGATIVE
Nitrite: NEGATIVE
Protein, ur: NEGATIVE mg/dL
Specific Gravity, Urine: 1.024 (ref 1.005–1.030)
Urobilinogen, UA: 0.2 mg/dL (ref 0.0–1.0)
pH: 5 (ref 5.0–8.0)

## 2014-05-20 LAB — CBC
HCT: 46.8 % — ABNORMAL HIGH (ref 36.0–46.0)
Hemoglobin: 15.2 g/dL — ABNORMAL HIGH (ref 12.0–15.0)
MCH: 30 pg (ref 26.0–34.0)
MCHC: 32.5 g/dL (ref 30.0–36.0)
MCV: 92.5 fL (ref 78.0–100.0)
Platelets: 261 10*3/uL (ref 150–400)
RBC: 5.06 MIL/uL (ref 3.87–5.11)
RDW: 13.4 % (ref 11.5–15.5)
WBC: 20.9 10*3/uL — AB (ref 4.0–10.5)

## 2014-05-20 LAB — GLUCOSE, CAPILLARY
GLUCOSE-CAPILLARY: 281 mg/dL — AB (ref 70–99)
GLUCOSE-CAPILLARY: 286 mg/dL — AB (ref 70–99)
GLUCOSE-CAPILLARY: 105 mg/dL — AB (ref 70–99)
GLUCOSE-CAPILLARY: 91 mg/dL (ref 70–99)
GLUCOSE-CAPILLARY: 100 mg/dL — AB (ref 70–99)
GLUCOSE-CAPILLARY: 138 mg/dL — AB (ref 70–99)
Glucose-Capillary: 143 mg/dL — ABNORMAL HIGH (ref 70–99)
Glucose-Capillary: 182 mg/dL — ABNORMAL HIGH (ref 70–99)
Glucose-Capillary: 201 mg/dL — ABNORMAL HIGH (ref 70–99)
Glucose-Capillary: 96 mg/dL (ref 70–99)

## 2014-05-20 LAB — MRSA PCR SCREENING: MRSA by PCR: NEGATIVE

## 2014-05-20 LAB — URINE MICROSCOPIC-ADD ON

## 2014-05-20 LAB — MAGNESIUM: MAGNESIUM: 2 mg/dL (ref 1.5–2.5)

## 2014-05-20 MED ORDER — SODIUM CHLORIDE 0.9 % IV SOLN
1000.0000 mL | Freq: Once | INTRAVENOUS | Status: AC
  Administered 2014-05-20: 1000 mL via INTRAVENOUS

## 2014-05-20 MED ORDER — SODIUM CHLORIDE 0.9 % IV BOLUS (SEPSIS)
1000.0000 mL | Freq: Once | INTRAVENOUS | Status: AC
  Administered 2014-05-20: 1000 mL via INTRAVENOUS

## 2014-05-20 MED ORDER — POTASSIUM CHLORIDE 10 MEQ/100ML IV SOLN
10.0000 meq | INTRAVENOUS | Status: AC
  Administered 2014-05-20 (×4): 10 meq via INTRAVENOUS
  Filled 2014-05-20 (×3): qty 100

## 2014-05-20 MED ORDER — SODIUM CHLORIDE 0.9 % IV SOLN: 1000.0000 mL | INTRAVENOUS | Status: DC

## 2014-05-20 MED ORDER — ALPRAZOLAM 1 MG PO TABS
1.0000 mg | ORAL_TABLET | Freq: Every evening | ORAL | Status: DC | PRN
  Administered 2014-05-20 – 2014-05-21 (×2): 1 mg via ORAL
  Filled 2014-05-20: qty 1
  Filled 2014-05-20: qty 2

## 2014-05-20 MED ORDER — SODIUM CHLORIDE 0.9 % IV SOLN
INTRAVENOUS | Status: DC
  Administered 2014-05-20: 125 mL/h via INTRAVENOUS
  Administered 2014-05-21: 07:00:00 via INTRAVENOUS

## 2014-05-20 MED ORDER — ACETAMINOPHEN 650 MG RE SUPP: 650.0000 mg | Freq: Four times a day (QID) | RECTAL | Status: DC | PRN

## 2014-05-20 MED ORDER — ONDANSETRON HCL 4 MG PO TABS
4.0000 mg | ORAL_TABLET | Freq: Four times a day (QID) | ORAL | Status: DC | PRN
Start: 2014-05-20 — End: 2014-05-22

## 2014-05-20 MED ORDER — KETOROLAC TROMETHAMINE 30 MG/ML IJ SOLN
30.0000 mg | Freq: Once | INTRAMUSCULAR | Status: AC
  Administered 2014-05-20: 30 mg via INTRAVENOUS
  Filled 2014-05-20: qty 1

## 2014-05-20 MED ORDER — MELOXICAM 15 MG PO TABS
15.0000 mg | ORAL_TABLET | Freq: Every day | ORAL | Status: DC
  Filled 2014-05-20: qty 1

## 2014-05-20 MED ORDER — SODIUM CHLORIDE 0.9 % IJ SOLN
3.0000 mL | Freq: Two times a day (BID) | INTRAMUSCULAR | Status: DC
  Administered 2014-05-20 – 2014-05-21 (×3): 3 mL via INTRAVENOUS

## 2014-05-20 MED ORDER — DEXTROSE-NACL 5-0.45 % IV SOLN
INTRAVENOUS | Status: DC
  Administered 2014-05-20: 17:00:00 via INTRAVENOUS

## 2014-05-20 MED ORDER — ALUM & MAG HYDROXIDE-SIMETH 200-200-20 MG/5ML PO SUSP: 30.0000 mL | Freq: Four times a day (QID) | ORAL | Status: DC | PRN

## 2014-05-20 MED ORDER — ENOXAPARIN SODIUM 40 MG/0.4ML ~~LOC~~ SOLN
40.0000 mg | SUBCUTANEOUS | Status: DC
  Administered 2014-05-20: 40 mg via SUBCUTANEOUS
  Filled 2014-05-20 (×3): qty 0.4

## 2014-05-20 MED ORDER — SUMATRIPTAN SUCCINATE 100 MG PO TABS
100.0000 mg | ORAL_TABLET | Freq: Two times a day (BID) | ORAL | Status: DC | PRN
  Administered 2014-05-20 – 2014-05-22 (×3): 100 mg via ORAL
  Filled 2014-05-20 (×5): qty 1

## 2014-05-20 MED ORDER — ACETAMINOPHEN 325 MG PO TABS: 650.0000 mg | ORAL_TABLET | Freq: Four times a day (QID) | ORAL | Status: DC | PRN

## 2014-05-20 MED ORDER — TRAMADOL HCL 50 MG PO TABS
50.0000 mg | ORAL_TABLET | Freq: Once | ORAL | Status: AC
  Administered 2014-05-20: 50 mg via ORAL
  Filled 2014-05-20: qty 1

## 2014-05-20 MED ORDER — LORAZEPAM 2 MG/ML IJ SOLN
0.5000 mg | Freq: Once | INTRAMUSCULAR | Status: AC
  Administered 2014-05-20: 0.5 mg via INTRAVENOUS
  Filled 2014-05-20: qty 1

## 2014-05-20 MED ORDER — TRAMADOL HCL 50 MG PO TABS
100.0000 mg | ORAL_TABLET | Freq: Four times a day (QID) | ORAL | Status: DC | PRN
  Administered 2014-05-20 – 2014-05-22 (×7): 100 mg via ORAL
  Filled 2014-05-20 (×7): qty 2

## 2014-05-20 MED ORDER — MECLIZINE HCL 25 MG PO TABS
25.0000 mg | ORAL_TABLET | ORAL | Status: DC | PRN
  Filled 2014-05-20: qty 1

## 2014-05-20 MED ORDER — INSULIN REGULAR HUMAN 100 UNIT/ML IJ SOLN
INTRAMUSCULAR | Status: DC
  Administered 2014-05-20: 3.2 [IU]/h via INTRAVENOUS
  Filled 2014-05-20: qty 2.5

## 2014-05-20 MED ORDER — ONDANSETRON HCL 4 MG/2ML IJ SOLN: 4.0000 mg | Freq: Four times a day (QID) | INTRAMUSCULAR | Status: DC | PRN

## 2014-05-20 MED ORDER — DEXTROSE-NACL 5-0.45 % IV SOLN: INTRAVENOUS | Status: DC

## 2014-05-20 MED ORDER — SODIUM CHLORIDE 0.9 % IV SOLN
INTRAVENOUS | Status: DC
  Filled 2014-05-20: qty 2.5

## 2014-05-20 MED ORDER — SODIUM CHLORIDE 0.9 % IV SOLN
INTRAVENOUS | Status: AC
  Administered 2014-05-20: 75 mL/h via INTRAVENOUS

## 2014-05-20 MED ORDER — ATORVASTATIN CALCIUM 10 MG PO TABS
10.0000 mg | ORAL_TABLET | Freq: Every day | ORAL | Status: DC
  Administered 2014-05-20 – 2014-05-21 (×2): 10 mg via ORAL
  Filled 2014-05-20 (×4): qty 1

## 2014-05-20 NOTE — ED Notes (Signed)
Pt reported that she feels like she is in DKA. Reported nausea without vomiting and generalized discomfort.

## 2014-05-20 NOTE — ED Notes (Signed)
Awake. Verbally responsive. A/O x4. Resp even and unlabored. No audible adventitious breath sounds noted. ABC's intact. IV infusing NS and Insulin Drip without difficulty.

## 2014-05-20 NOTE — H&P (Signed)
History and PhysicalAshey Palmer   LPF:[id number] DOB: [birth year] DOA: 05/20/2014  Referring physician: Dr. Elnora Morrison PCP: Laurey Morale, MD  Endocrinologist: Dr. Delrae Rend  Chief Complaint: High blood sugars, body aches.  History of Present Illness:   Katie Palmer is an 64 y.o. 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 Buddy Duty for endocrinology F/U, now on an insulin pump who comes in with high blood sugars and severe myalgias.  She reports that her sugars have been running in the 500's over the past couple of days.  Her symptoms have been associated with polyuria and polydipsia.  Denies any aggravating factors, frustrated because she doesn't understand how her sugar could be "so high" when she hasn't "eaten anything".  She says she has been out of Tramadol for the past 3 days, and that she has been having muscle/body aches from "withdrawal".  Normally takes 2 Tramadol every 4 hours for control of fibromyalgia.  No fever or chills, but does endorse that she had a cold last week.  Her sugars were labile before they became uncrontolled into the 500s.  She had a low reading in the 30's last week.  Last saw Dr. Buddy Duty 2 weeks ago, and reports that he increased the basal and the bolus rate at that visit.  Last hemoglobin A1c was 8.7% on 04/12/14.    ROS:   Constitutional: No fever, no chills;  Appetite normal; No weight loss, + 30 lb weight gain since July, + fatigue.  HEENT: + blurry vision, no diplopia, no pharyngitis, no dysphagia CV: No chest pain, no palpitations, no PND, no orthopnea, no edema.  Resp: No SOB, no cough, no pleuritic pain. GI: No nausea, no vomiting, no diarrhea, no melena, no hematochezia, no constipation, no abdominal pain.  GU: No dysuria, no hematuria, no frequency, no urgency. MSK: + myalgias, no arthralgias.  Neuro:  + chronic daily  headache, no focal neurological deficits, no history of seizures.  Psych: No depression, no anxiety.  Endo: No heat intolerance, no cold intolerance, + polyuria, + polydipsia  Skin: No rashes, no skin lesions.  Heme: No easy bruising.  Travel history: No recent travel.   Past Medical History:   Past Medical History  Diagnosis Date  . Hx of colonic polyps   . Depression   . Headache(784.0)   . Hypertension   . Insomnia   . Fibromyalgia   . Diabetes mellitus without complication   . Vertigo     Past Surgical History:   Past Surgical History  Procedure Laterality Date  . Pulmonary stress test      Normal on 02/04/09. Sees Dr. Philis Pique for GYN exams  . Tonsillectomy    . Colonoscopy  Feb. 2012    per GI in [place], clear, repeat in 5 yrs   . Diagnostic mammogram      01/27/2008. normal    Social History:   History   Social History  . Marital Status: Divorced    Spouse Name: N/A  . Number of Children: 1  . Years of Education: N/A   Occupational History  . Life insurance agent    Social History Main Topics  . Smoking status: Never Smoker   . Smokeless tobacco: Never Used  . Alcohol Use: No  . Drug Use: No  . Sexual Activity: Not on file   Other  Topics Concern  . Not on file   Social History Narrative   Divorced.  Son and grandson live with her.  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    Family history:   Family History  Problem Relation Age of Onset  . Alcohol abuse Father     Died age 64  . Leukemia Brother     Died age 76  . Heart disease Father   . Aneurysm Mother     Died age 50  . COPD Sister   . Rheum arthritis Sister   . Heart failure Sister     Allergies   Cymbalta and Lasix  Current Medications:   Prior to Admission medications   Medication Sig Start Date End Date Taking? Authorizing Provider  ALPRAZolam Duanne Moron) 1 MG tablet Take 1 tablet (1 mg total) by mouth at bedtime as needed for sleep. 02/26/14 02/26/15  Yes Laurey Morale, MD  atorvastatin (LIPITOR) 10 MG tablet Take 1 tablet (10 mg total) by mouth daily. 02/26/14  Yes Laurey Morale, MD  cyclobenzaprine (FLEXERIL) 10 MG tablet TAKE 1 TABLET BY MOUTH 3 TIMES DAILY AS NEEDED. Patient taking differently: 10 mg 3 (three) times daily as needed for muscle spasms.  02/26/14  Yes Laurey Morale, MD  GLUCAGON EMERGENCY 1 MG injection Use as directed for severe low blood sugar 03/08/14  Yes Historical Provider, MD  HYDROcodone-acetaminophen (NORCO) 10-325 MG per tablet Take 1 tablet by mouth every 6 (six) hours as needed. Patient taking differently: Take 1 tablet by mouth every 6 (six) hours as needed for moderate pain.  04/19/14  Yes Laurey Morale, MD  Insulin Human (INSULIN PUMP) SOLN Continue use of your insulin pump as instructed by Dr. Buddy Duty Patient taking differently: Novolog 4-6 UNITS SUBQ PLUS 1 EXTRA UNIT FOR EVERY 50MG /DL ABOVE 100MG /DL AT THE START OF MEALS Continue use of your insulin pump as instructed by Dr. Buddy Duty 04/14/14  Yes Cherene Altes, MD  losartan-hydrochlorothiazide (HYZAAR) 50-12.5 MG per tablet Take 1 tablet by mouth daily. 02/26/14  Yes Laurey Morale, MD  meclizine (ANTIVERT) 25 MG tablet Take 1 tablet (25 mg total) by mouth every 4 (four) hours as needed for dizziness. 02/26/14  Yes Laurey Morale, MD  meloxicam (MOBIC) 15 MG tablet Take 15 mg by mouth daily.   Yes Historical Provider, MD  ondansetron (ZOFRAN ODT) 4 MG disintegrating tablet Take 1 tablet (4 mg total) by mouth every 8 (eight) hours as needed for nausea. 03/15/13  Yes Laurey Morale, MD  SUMAtriptan (IMITREX) 100 MG tablet Take 1 tablet (100 mg total) by mouth as needed for migraine or headache. May repeat in 2 hours if headache persists or recurs. 02/26/14  Yes Laurey Morale, MD  traMADol (ULTRAM) 50 MG tablet Take 2 tablets (100 mg total) by mouth every 6 (six) hours as needed. Patient taking differently: Take 100 mg by mouth every 6 (six) hours as needed for moderate pain.   02/26/14  Yes Laurey Morale, MD  ACCU-CHEK [facility] LANCETS lancets Use as instructed Patient not taking: Reported on 05/20/2014 02/24/13   Philemon Kingdom, MD  glucose blood test strip Use 3x a day - AccuCheck nano Patient not taking: Reported on 05/20/2014 02/24/13   Philemon Kingdom, MD    Physical Exam:   Filed Vitals:   05/20/14 1230 05/20/14 1300 05/20/14 1301 05/20/14 1348  BP: 111/73 119/64 119/64 139/83  Pulse: 107 111 111 110  Temp:  97.7 F (36.5 C) 97.9 F (36.6 C)  TempSrc:   Oral Oral  Resp:  22 21 20   Height:    5' 4.5" (1.638 m)  Weight:    83.9 kg (184 lb 15.5 oz)  SpO2: 95% 99% 99% 100%     Physical Exam: Blood pressure 139/83, pulse 110, temperature 97.9 F (36.6 C), temperature source Oral, resp. rate 20, height 5' 4.5" (1.638 m), weight 83.9 kg (184 lb 15.5 oz), SpO2 100 %. Gen: No acute distress.  Uncomfortable appearing. Head: Normocephalic, atraumatic. Eyes: EOMI, sclerae nonicteric. Mouth: Oropharynx with dry mucous membranes. Neck: Supple, no thyromegaly, no lymphadenopathy, no jugular venous distention. Chest: Lungs CTAB. CV: Heart sounds are tachy/reg.  No M/R/G. Abdomen: Soft, nontender, nondistended with normal active bowel sounds. Extremities: Extremities are without C/E/C. Skin: Warm and dry. Neuro: Alert and oriented times 3; cranial nerves II through XII grossly intact. Psych: Mood and affect depressed/anxious.   Data Review:    Labs: Basic Metabolic Panel:  Recent Labs Lab 05/20/14 0925  NA 135  K 4.3  CL 102  CO2 10*  GLUCOSE 385*  BUN 29*  CREATININE 1.11*  CALCIUM 9.2   Liver Function Tests:  Recent Labs Lab 05/20/14 0925  AST 23  ALT 19  ALKPHOS 85  BILITOT 1.5*  PROT 7.4  ALBUMIN 4.4   CBC:  Recent Labs Lab 05/20/14 0925  WBC 13.3*  NEUTROABS 9.7*  HGB 15.8*  HCT 45.6  MCV 88.9  PLT 270   CBG:  Recent Labs Lab 05/20/14 0805 05/20/14 0958 05/20/14 1129 05/20/14 1247 05/20/14 1357  GLUCAP  302* 352* 382* 302* 281*    Radiographic Studies:  No results found.   EKG: Not ordered.   Assessment/Plan:   Principal Problem:   DKA, type 1 / Uncontrolled type I DM - Status post IV fluid bolus in the ER consisting of normal saline x2 liters, continued to vigorously hydrate with normal saline. - Start on insulin drip per glucose stabilizer protocol with every hour CBG checks and every 4 hour BMET checks until stable. - Supplement potassium if needed. 4 K runs ordered. - Add dextrose to IV fluids when serum glucose less than 250. - Transition back to insulin pump when the ketoacidosis has resolved and the patient is able to eat.  - DM coordinator consulted.  Active Problems:   AKI - Baseline creatinine 0.84.  Hydrate and monitor.  Likely pre-renal from osmotic diuresis/dehydration from DKA.    Essential hypertension - Hold Hyzaar given AKI. - Resume when renal function at baseline.    Fibromyalgia - Resume Ultram.    Leukocytosis - Likely from hemoconcentration.  No signs/symptoms of infection. - Hydrate and monitor.    Dyslipidemia - Continue Lipitor.    Depression and anxiety - Continue Xanax.    DVT prophylaxis - Lovenox ordered.  Code Status: Full. Family Communication: [name] (son) is emergency contact, declines my offer to call at this time. Disposition Plan: Home when acidosis resolved, blood sugars controlled, likely in 2-3 days.  Time spent: 70 minutes.  Rhina Kramme [facility] Pager [phone] Cell: [phone]   If 7PM-7AM, please contact night-coverage [email or web address] Password [email or web address] 05/20/2014, 2:50 PM

## 2014-05-20 NOTE — ED Notes (Signed)
Katie Palmer, her nurse is made aware of critical CO2 just phoned by lab.

## 2014-05-20 NOTE — ED Notes (Signed)
Awake. Verbally responsive. A/O x4. Resp even and unlabored. No audible adventitious breath sounds noted. ABC's intact.  

## 2014-05-20 NOTE — ED Notes (Signed)
Resting quietly with eye closed. Easily arousable. Verbally responsive. Resp even and unlabored. ABC's intact. IV infusing NS at 974ml/hr without difficult.  Pt calmed and relax. No tachypnea noted. Pt reported that she felt better just has pain from fibroidmyalgia. NAD noted.

## 2014-05-20 NOTE — ED Notes (Addendum)
Not done at this time d/t duplicate order.

## 2014-05-20 NOTE — ED Notes (Signed)
MD at bedside. 

## 2014-05-20 NOTE — ED Provider Notes (Signed)
CSN: [id number]     Arrival date & time 05/20/14  4098 History   First MD Initiated Contact with Patient 05/20/14 [phone]     Chief Complaint  Patient presents with  . Back Pain     (Consider location/radiation/quality/duration/timing/severity/associated sxs/prior Treatment) HPI   PCP: FRY,STEPHEN A, MD Blood pressure 128/79, pulse 106, temperature 97.8 F (36.6 C), temperature source Oral, resp. rate 17, SpO2 100 %.  Katie Palmer is a 64 y.o.female with a significant PMH of fibromyalgia, depression, headache, hypertension, insomnia, diabetes without complication presents to the ER with complaints of body pains all over, she is concerned that she is in ketoacidosis due to her glucose running in the 500's over the past few days and that the body aches is typically how she presents in DKA. She has not had any nausea, vomiting, diarrhea, increased thirst. She is having a lot of anxiety and has been out of her Ultram which she takes for her Fibromyalgia for a few days due to being unable to find them. The patient is flushed and tachycardic on initial presentation.  The patient is a type 2 and recently started using an insulin pump. Her endocrinologist is Dr. Buddy Duty in [place]. Negative Review of Symptoms: back pain, chest pain, N/V/D, lower extremity swelling, weakness, SOB.  Past Medical History  Diagnosis Date  . Hx of colonic polyps   . Depression   . Headache(784.0)   . Hypertension   . Insomnia   . Fibromyalgia   . Diabetes mellitus without complication   . Vertigo    Past Surgical History  Procedure Laterality Date  . Pulmonary stress test      Normal on 02/04/09. Sees Dr. Philis Pique for GYN exams  . Tonsillectomy    . Colonoscopy  Feb. 2012    per GI in [place], clear, repeat in 5 yrs   . Diagnostic mammogram      01/27/2008. normal   Family History  Problem Relation Age of Onset  . Alcohol abuse Father     Died age 6  . Leukemia Brother     Died age 74  . Heart  disease Father   . Aneurysm Mother     Died age 98  . COPD Sister   . Rheum arthritis Sister   . Heart failure Sister    History  Substance Use Topics  . Smoking status: Never Smoker   . Smokeless tobacco: Never Used  . Alcohol Use: No   OB History    No data available     Review of Systems  10 Systems reviewed and are negative for acute change except as noted in the HPI.    Allergies  Cymbalta and Lasix  Home Medications   Prior to Admission medications   Medication Sig Start Date End Date Taking? Authorizing Provider  ALPRAZolam Duanne Moron) 1 MG tablet Take 1 tablet (1 mg total) by mouth at bedtime as needed for sleep. 02/26/14 02/26/15 Yes Laurey Morale, MD  atorvastatin (LIPITOR) 10 MG tablet Take 1 tablet (10 mg total) by mouth daily. 02/26/14  Yes Laurey Morale, MD  cyclobenzaprine (FLEXERIL) 10 MG tablet TAKE 1 TABLET BY MOUTH 3 TIMES DAILY AS NEEDED. Patient taking differently: 10 mg 3 (three) times daily as needed for muscle spasms.  02/26/14  Yes Laurey Morale, MD  GLUCAGON EMERGENCY 1 MG injection Use as directed for severe low blood sugar 03/08/14  Yes Historical Provider, MD  HYDROcodone-acetaminophen (NORCO) 10-325 MG per tablet Take 1 tablet  by mouth every 6 (six) hours as needed. Patient taking differently: Take 1 tablet by mouth every 6 (six) hours as needed for moderate pain.  04/19/14  Yes Laurey Morale, MD  Insulin Human (INSULIN PUMP) SOLN Continue use of your insulin pump as instructed by Dr. Buddy Duty Patient taking differently: Novolog 4-6 UNITS SUBQ PLUS 1 EXTRA UNIT FOR EVERY 50MG /DL ABOVE 100MG /DL AT THE START OF MEALS Continue use of your insulin pump as instructed by Dr. Buddy Duty 04/14/14  Yes Cherene Altes, MD  losartan-hydrochlorothiazide (HYZAAR) 50-12.5 MG per tablet Take 1 tablet by mouth daily. 02/26/14  Yes Laurey Morale, MD  meclizine (ANTIVERT) 25 MG tablet Take 1 tablet (25 mg total) by mouth every 4 (four) hours as needed for dizziness. 02/26/14  Yes Laurey Morale, MD  meloxicam (MOBIC) 15 MG tablet Take 15 mg by mouth daily.   Yes Historical Provider, MD  ondansetron (ZOFRAN ODT) 4 MG disintegrating tablet Take 1 tablet (4 mg total) by mouth every 8 (eight) hours as needed for nausea. 03/15/13  Yes Laurey Morale, MD  SUMAtriptan (IMITREX) 100 MG tablet Take 1 tablet (100 mg total) by mouth as needed for migraine or headache. May repeat in 2 hours if headache persists or recurs. 02/26/14  Yes Laurey Morale, MD  traMADol (ULTRAM) 50 MG tablet Take 2 tablets (100 mg total) by mouth every 6 (six) hours as needed. Patient taking differently: Take 100 mg by mouth every 6 (six) hours as needed for moderate pain.  02/26/14  Yes Laurey Morale, MD  ACCU-CHEK [facility] LANCETS lancets Use as instructed Patient not taking: Reported on 05/20/2014 02/24/13   Philemon Kingdom, MD  glucose blood test strip Use 3x a day - AccuCheck nano Patient not taking: Reported on 05/20/2014 02/24/13   Philemon Kingdom, MD   BP 116/71 mmHg  Pulse 110  Temp(Src) 98.4 F (36.9 C) (Oral)  Resp 18  SpO2 100% Physical Exam  Constitutional: She appears well-developed and well-nourished. She appears ill. She appears distressed.  HENT:  Head: Normocephalic and atraumatic.  Eyes: Pupils are equal, round, and reactive to light.  Neck: Normal range of motion. Neck supple.  Cardiovascular: Normal rate and regular rhythm.   Pulmonary/Chest: Effort normal and breath sounds normal. She has no decreased breath sounds. She has no wheezes.  Abdominal: Soft. Bowel sounds are normal. There is no tenderness.  Neurological: She is alert.  Skin: Skin is warm.  Nursing note and vitals reviewed.   ED Course  Procedures (including critical care time) Labs Review Labs Reviewed  URINALYSIS, ROUTINE W REFLEX MICROSCOPIC - Abnormal; Notable for the following:    Glucose, UA >1000 (*)    Ketones, ur >80 (*)    All other components within normal limits  CBC WITH DIFFERENTIAL/PLATELET - Abnormal;  Notable for the following:    WBC 13.3 (*)    RBC 5.13 (*)    Hemoglobin 15.8 (*)    Neutro Abs 9.7 (*)    All other components within normal limits  COMPREHENSIVE METABOLIC PANEL - Abnormal; Notable for the following:    CO2 10 (*)    Glucose, Bld 385 (*)    BUN 29 (*)    Creatinine, Ser 1.11 (*)    Total Bilirubin 1.5 (*)    GFR calc non Af Amer 51 (*)    GFR calc Af Amer 59 (*)    All other components within normal limits  BLOOD GAS, ARTERIAL - Abnormal; Notable  for the following:    pH, Arterial 7.129 (*)    pCO2 arterial 25.3 (*)    pO2, Arterial 102.0 (*)    Bicarbonate 8.0 (*)    Acid-base deficit 20.3 (*)    All other components within normal limits  CBG MONITORING, ED - Abnormal; Notable for the following:    Glucose-Capillary 302 (*)    All other components within normal limits  CBG MONITORING, ED - Abnormal; Notable for the following:    Glucose-Capillary 352 (*)    All other components within normal limits  URINE MICROSCOPIC-ADD ON  KETONES, URINE  CBG MONITORING, ED  CBG MONITORING, ED    Imaging Review No results found.   EKG Interpretation None      MDM   Final diagnoses:  Diabetic ketoacidosis without coma associated with other specified diabetes mellitus    Medications  0.9 %  sodium chloride infusion (125 mL/hr Intravenous New Bag/Given 05/20/14 0919)  dextrose 5 %-0.45 % sodium chloride infusion (not administered)  insulin regular (NOVOLIN R,HUMULIN R) 250 Units in sodium chloride 0.9 % 250 mL (1 Units/mL) infusion (3.2 Units/hr Intravenous New Bag/Given 05/20/14 1130)  0.9 %  sodium chloride infusion (1,000 mLs Intravenous New Bag/Given 05/20/14 1119)    Followed by  0.9 %  sodium chloride infusion (not administered)  sodium chloride 0.9 % bolus 1,000 mL (0 mLs Intravenous Stopped 05/20/14 0920)  LORazepam (ATIVAN) injection 0.5 mg (0.5 mg Intravenous Given 05/20/14 0840)  ketorolac (TORADOL) 30 MG/ML injection 30 mg (30 mg Intravenous Given  05/20/14 0839)  traMADol (ULTRAM) tablet 50 mg (50 mg Oral Given 05/20/14 0919)    Patient is in DKA with acid gap of 20.3, her pain was controlled with IV pain medications. She was started on the glucostabilizer. Pt looking much improved with fluids. Will admit to [facility] for stepdown  Filed Vitals:   05/20/14 1100  BP: 122/64  Pulse: 112  Temp:   Resp:    11: 49 am- I spoke with Dr. Rockne Menghini, she has agreed to admit the patient to stepdown, [facility]. Pt aware of plan and agreeable.  Delos Haring, PA-C 05/20/14 1150  Elnora Morrison, MD 05/20/14 [phone]

## 2014-05-20 NOTE — ED Notes (Signed)
She is very anxious as she tells me "I'm going into ketoacidosis".  She states she has an insulin pump and she has discomfort at upper back/shoulders area which she has felt in the past when she had ketoacidosis.  She was quite resistive of giving me any further information.  We take her directly to rm. 10.  She is in no distress.

## 2014-05-20 NOTE — ED Notes (Signed)
Awake. Verbally responsive. A/O x4. Resp even and unlabored. No audible adventitious breath sounds noted. ABC's intact. IV infusing NS at 942ml/hr and Insulin at 3.45ml/hr without difficulty.

## 2014-05-21 LAB — BASIC METABOLIC PANEL
Anion gap: 3 — ABNORMAL LOW (ref 5–15)
Anion gap: 7 (ref 5–15)
Anion gap: 9 (ref 5–15)
BUN: 18 mg/dL (ref 6–23)
BUN: 16 mg/dL (ref 6–23)
BUN: 17 mg/dL (ref 6–23)
CALCIUM: 8 mg/dL — AB (ref 8.4–10.5)
CALCIUM: 8.2 mg/dL — AB (ref 8.4–10.5)
CHLORIDE: 110 mmol/L (ref 96–112)
CO2: 20 mmol/L (ref 19–32)
CO2: 19 mmol/L (ref 19–32)
CO2: 19 mmol/L (ref 19–32)
CREATININE: 0.85 mg/dL (ref 0.50–1.10)
Calcium: 7.9 mg/dL — ABNORMAL LOW (ref 8.4–10.5)
Chloride: 115 mmol/L — ABNORMAL HIGH (ref 96–112)
Chloride: 115 mmol/L — ABNORMAL HIGH (ref 96–112)
Creatinine, Ser: 0.86 mg/dL (ref 0.50–1.10)
Creatinine, Ser: 0.77 mg/dL (ref 0.50–1.10)
GFR calc Af Amer: 80 mL/min — ABNORMAL LOW (ref >90)
GFR calc Af Amer: >90 mL/min (ref >90)
GFR calc non Af Amer: 69 mL/min — ABNORMAL LOW (ref >90)
GFR calc non Af Amer: 86 mL/min — ABNORMAL LOW (ref >90)
GFR calc non Af Amer: 70 mL/min — ABNORMAL LOW (ref >90)
GFR, EST AFRICAN AMERICAN: 82 mL/min — AB (ref >90)
GLUCOSE: 279 mg/dL — AB (ref 70–99)
Glucose, Bld: 201 mg/dL — ABNORMAL HIGH (ref 70–99)
Glucose, Bld: 118 mg/dL — ABNORMAL HIGH (ref 70–99)
Potassium: 3.8 mmol/L (ref 3.5–5.1)
Potassium: 3.7 mmol/L (ref 3.5–5.1)
Potassium: 4.4 mmol/L (ref 3.5–5.1)
Sodium: 138 mmol/L (ref 135–145)
Sodium: 141 mmol/L (ref 135–145)
Sodium: 138 mmol/L (ref 135–145)

## 2014-05-21 LAB — GLUCOSE, CAPILLARY
GLUCOSE-CAPILLARY: 159 mg/dL — AB (ref 70–99)
GLUCOSE-CAPILLARY: 247 mg/dL — AB (ref 70–99)
GLUCOSE-CAPILLARY: 265 mg/dL — AB (ref 70–99)
GLUCOSE-CAPILLARY: 217 mg/dL — AB (ref 70–99)
Glucose-Capillary: 103 mg/dL — ABNORMAL HIGH (ref 70–99)
Glucose-Capillary: 110 mg/dL — ABNORMAL HIGH (ref 70–99)
Glucose-Capillary: 110 mg/dL — ABNORMAL HIGH (ref 70–99)
Glucose-Capillary: 138 mg/dL — ABNORMAL HIGH (ref 70–99)
Glucose-Capillary: 163 mg/dL — ABNORMAL HIGH (ref 70–99)
Glucose-Capillary: 185 mg/dL — ABNORMAL HIGH (ref 70–99)
Glucose-Capillary: 185 mg/dL — ABNORMAL HIGH (ref 70–99)
Glucose-Capillary: 226 mg/dL — ABNORMAL HIGH (ref 70–99)
Glucose-Capillary: 314 mg/dL — ABNORMAL HIGH (ref 70–99)

## 2014-05-21 LAB — CBC
HEMATOCRIT: 37.4 % (ref 36.0–46.0)
HEMOGLOBIN: 12.6 g/dL (ref 12.0–15.0)
MCH: 30.1 pg (ref 26.0–34.0)
MCHC: 33.7 g/dL (ref 30.0–36.0)
MCV: 89.3 fL (ref 78.0–100.0)
PLATELETS: 195 10*3/uL (ref 150–400)
RBC: 4.19 MIL/uL (ref 3.87–5.11)
RDW: 13.4 % (ref 11.5–15.5)
WBC: 11.3 10*3/uL — ABNORMAL HIGH (ref 4.0–10.5)

## 2014-05-21 LAB — CORTISOL: CORTISOL PLASMA: 8.5 ug/dL

## 2014-05-21 MED ORDER — INSULIN ASPART 100 UNIT/ML ~~LOC~~ SOLN
5.0000 [IU] | Freq: Three times a day (TID) | SUBCUTANEOUS | Status: DC
  Administered 2014-05-21 – 2014-05-22 (×2): 5 [IU] via SUBCUTANEOUS

## 2014-05-21 MED ORDER — INSULIN ASPART 100 UNIT/ML ~~LOC~~ SOLN
0.0000 [IU] | Freq: Three times a day (TID) | SUBCUTANEOUS | Status: DC
  Administered 2014-05-21 – 2014-05-22 (×2): 3 [IU] via SUBCUTANEOUS

## 2014-05-21 MED ORDER — INSULIN ASPART 100 UNIT/ML ~~LOC~~ SOLN: 0.0000 [IU] | Freq: Every day | SUBCUTANEOUS | Status: DC

## 2014-05-21 MED ORDER — INSULIN ASPART 100 UNIT/ML ~~LOC~~ SOLN
0.0000 [IU] | Freq: Three times a day (TID) | SUBCUTANEOUS | Status: DC
  Administered 2014-05-21: 5 [IU] via SUBCUTANEOUS

## 2014-05-21 MED ORDER — INSULIN ASPART 100 UNIT/ML ~~LOC~~ SOLN: 5.0000 [IU] | Freq: Three times a day (TID) | SUBCUTANEOUS | Status: DC

## 2014-05-21 MED ORDER — INSULIN GLARGINE 100 UNIT/ML ~~LOC~~ SOLN
20.0000 [IU] | Freq: Every day | SUBCUTANEOUS | Status: DC
  Administered 2014-05-21: 20 [IU] via SUBCUTANEOUS
  Filled 2014-05-21 (×2): qty 0.2

## 2014-05-21 NOTE — Progress Notes (Signed)
Progress Note   Katie Palmer KCL:[id number] DOB: 1950-11-28 DOA: 05/20/2014 PCP: Katie Morale, MD   Brief Narrative:   Katie Palmer is an 64 y.o. 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 Buddy Palmer for endocrinology F/U, now on an insulin pump who was admitted 05/20/14 with high blood sugars and severe myalgias.Upon initial evaluation in the ED, her blood glucose was 385, bicarbonate with an anion gap of 23.  Assessment/Plan:   Principal Problem:  DKA, type 1 / Uncontrolled type I DM - Status post IV fluid bolus in the ER consisting of normal saline x2 liters, and aggressive fluid replacement. - Start on insulin drip per glucose stabilizer protocol with every hour CBG checks and every 4 hour BMET checks until stable. - DKA resolved overnight, patient transitioned to basal/bolus insulin. - Potassium was supplemented and remains WNL. - DM coordinator consulted.  Need to resume insulin pump.  - Currently being managed with 20 units of Lantus daily and moderate scale SSI. CBGs 103-185.  Active Problems:  AKI - Baseline creatinine 0.84. Renal function back to baseline values with vigorous hydration.   Essential hypertension - Hyzaar on hold. Blood pressure still on the low side, we'll resume when blood pressures improve.   Fibromyalgia - Continue Ultram.   Leukocytosis - Likely from hemoconcentration. No signs/symptoms of infection. - Improving.   Dyslipidemia - Continue Lipitor.   Depression and anxiety - Continue Xanax.   DVT prophylaxis - Lovenox ordered.  Code Status: Full. Family Communication: Katie Palmer (son) is emergency contact, declines my offer to call at this time. Disposition Plan: Home when acidosis resolved, blood sugars controlled, likely in 1-2 days.   IV Access:    Peripheral IV   Procedures and  diagnostic studies:   No results found.   Medical Consultants:    None.  Anti-Infectives:    None.  Subjective:   Katie Palmer feels well this morning.  No complaint of N/V/D.  Pain improved with resumption of Ultram.  Denies that her insulin pump is broken.  No dyspnea, cough.  Objective:    Filed Vitals:   05/20/14 2200 05/20/14 2300 05/21/14 0000 05/21/14 0400  BP: 111/51 113/55 111/52   Pulse: 81 80 79   Temp:   97.5 F (36.4 C) 97.9 F (36.6 C)  TempSrc:   Oral Oral  Resp: 21 21 19    Height:      Weight:      SpO2: 98% 96% 98%     Intake/Output Summary (Last 24 hours) at 05/21/14 0709 Last data filed at 05/21/14 0200  Gross per 24 hour  Intake 2740.59 ml  Output    700 ml  Net 2040.59 ml    Exam: Gen:  NAD, affect brighter Cardiovascular:  RRR, No M/R/G Respiratory:  Lungs CTAB Gastrointestinal:  Abdomen soft, NT/ND, + BS Extremities:  No C/E/C   Data Reviewed:    Labs: Basic Metabolic Panel:  Recent Labs Lab 05/20/14 0925 05/20/14 1503 05/20/14 1826 05/20/14 2250 05/21/14 0238  NA 135 141 139 138 138  K 4.3 4.3 4.4 4.3 3.8  CL 102 111 117* 114* 115*  CO2 10* 14* 17* 16* 20  GLUCOSE 385* 281* 131* 159* 201*  BUN 29* 27* 26* 22 18  CREATININE 1.11* 1.01 0.82 0.76 0.86  CALCIUM 9.2 8.5 8.0* 7.9* 7.9*  MG  --  2.0  --   --   --  PHOS  --  4.1  --   --   --    GFR Estimated Creatinine Clearance: 69.1 mL/min (by C-G formula based on Cr of 0.86). Liver Function Tests:  Recent Labs Lab 05/20/14 0925  AST 23  ALT 19  ALKPHOS 85  BILITOT 1.5*  PROT 7.4  ALBUMIN 4.4   CBC:  Recent Labs Lab 05/20/14 0925 05/20/14 1503 05/21/14 0238  WBC 13.3* 20.9* 11.3*  NEUTROABS 9.7*  --   --   HGB 15.8* 15.2* 12.6  HCT 45.6 46.8* 37.4  MCV 88.9 92.5 89.3  PLT 270 261 195   CBG:  Recent Labs Lab 05/21/14 0200 05/21/14 0303 05/21/14 0405 05/21/14 0512 05/21/14 0616  GLUCAP 159* 185* 138* 110* 103*   Sepsis Labs:  Recent  Labs Lab 05/20/14 0925 05/20/14 1503 05/21/14 0238  WBC 13.3* 20.9* 11.3*   Microbiology Recent Results (from the past 240 hour(s))  MRSA PCR Screening     Status: None   Collection Time: 05/20/14  1:57 PM  Result Value Ref Range Status   MRSA by PCR NEGATIVE NEGATIVE Final    Comment:        The GeneXpert MRSA Assay (FDA approved for NASAL specimens only), is one component of a comprehensive MRSA colonization surveillance program. It is not intended to diagnose MRSA infection nor to guide or monitor treatment for MRSA infections.      Medications:   . atorvastatin  10 mg Oral Daily  . enoxaparin (LOVENOX) injection  40 mg Subcutaneous Q24H  . insulin aspart  0-15 Units Subcutaneous TID WC  . insulin aspart  0-5 Units Subcutaneous QHS  . insulin glargine  20 Units Subcutaneous Daily  . sodium chloride  3 mL Intravenous Q12H   Continuous Infusions: . sodium chloride 75 mL/hr at 05/21/14 0649  . sodium chloride      Time spent: 35 minutes with > 50% of time discussing current diagnostic test results, clinical impression and plan of care.    LOS: 1 day   RAMA,CHRISTINA  [facility] Pager [phone]. If unable to reach me by pager, please call my cell phone at [phone].  *Please refer to [email or web address], password [email or web address] to get updated schedule on who will round on this patient, as hospitalists switch teams weekly. If 7PM-7AM, please contact night-coverage at [email or web address], password [email or web address] for any overnight needs.  05/21/2014, 7:09 AM

## 2014-05-21 NOTE — Significant Event (Signed)
Diabetes Coordinator Rhonda requested RN to check blood sugar. Glucose was 247. Suanne Marker spoke with Dr. Rockne Menghini and received telephone order to cover for blood sugar of 247 and to check blood sugar prior to patient eating lunch.

## 2014-05-21 NOTE — Progress Notes (Signed)
Inpatient Diabetes Program Recommendations  AACE/ADA: New Consensus Statement on Inpatient Glycemic Control (2013)  Target Ranges:  Prepandial:   less than 140 mg/dL      Peak postprandial:   less than 180 mg/dL (1-2 hours)      Critically ill patients:  140 - 180 mg/dL   Reason for Visit: Hyperglycemia  RN paged Diabetes Coordinator regarding pt's blood sugars. Pt's pump passed testing with pump company. Pt restarted pump for meal coverage and correction insulin. Programed it to begin basal rate on 4/26 at 0500. (Diabetes Coordinator did not instruct pt to restart pump.  We had discussed getting back on pump and explained to pt this is a MD order.) Received Novolog 5 units SQ for CBG of 247 at 1127. Received 6 units per insulin pump at 1300 for CBG of 265. Blood sugar was 314 at 1438 (< 2hours post-prandial).   Pt started on insulin pump in February 2016. This is 2nd admission for DKA in 2 months.   Recommendation: D/C insulin pump (even though it has not been ordered). Add meal coverage insulin - Novolog 5 units tidwc. Check blood sugars tidwc and hs + 0200 and correct with Novolog moderate. Consider adjusting basal insulin - may need slightly more than 20 units since blood sugars elevated all day.   Long discussion with pt regarding importance of controlling blood sugars at home, eating on a regular basis (son states pt sometimes skips meals) Important to f/u with Dr. Buddy Duty for insulin pump adjustments since pt has had 2 admissions for DKA since starting pump 2 months ago.   Will f/u in am.  Thank you. Lorenda Peck, RD, LDN, CDE Inpatient Diabetes Coordinator [phone]

## 2014-05-21 NOTE — Care Management (Signed)
CARE MANAGEMENT NOTE 05/21/2014  Patient:  Katie Palmer,Katie Palmer   Account Number:  [id number]  Date Initiated:  05/21/2014  Documentation initiated by:  DAVIS,RHONDA  Subjective/Objective Assessment:   dka     Action/Plan:   home when stable   Anticipated DC Date:  05/24/2014   Anticipated DC Plan:  HOME/SELF CARE  In-house referral  NA      DC Planning Services  CM consult      Choice offered to / List presented to:  NA      DME agency  NA        [place] agency  NA   Status of service:  In process, will continue to follow Medicare Important Message given?   (If response is "NO", the following Medicare IM given date fields will be blank) Date Medicare IM given:   Medicare IM given by:   Date Additional Medicare IM given:   Additional Medicare IM given by:    Discharge Disposition:    Per UR Regulation:  Reviewed for med. necessity/level of care/duration of stay  If discussed at Goodnight of Stay Meetings, dates discussed:    Comments:  May 21, 2014/Rhonda L. Rosana Hoes, RN, BSN, CCM. Case Management [place] [phone] No discharge needs present of time of review.

## 2014-05-21 NOTE — Sleep Study (Signed)
Patient has been transferred to [facility], taken via wheelchair. All personal belongings taken with patient. VS stable prior and during the transfer. Family aware of transfer. Report given to receiving dayshift RN.   Receiving RN made aware that MD wants blood sugar checked Q2h when patient is back on her insulin pump. Roosevelt Bisher, Therapist, sports.

## 2014-05-21 NOTE — Significant Event (Signed)
Patient told RN that she has placed herself back on her pump at 1255-but not receiving insulin yet.   1300-Blood sugar taken using hospital machine, CBG 265.   Patient programed her pump to give her bolus insulin without basal insulin until tomorrow.   She received 6.38 units via pump. Notified MD for order for insulin pump per patient.

## 2014-05-21 NOTE — Progress Notes (Signed)
Inpatient Diabetes Program Recommendations  AACE/ADA: New Consensus Statement on Inpatient Glycemic Control (2013)  Target Ranges:  Prepandial:   less than 140 mg/dL      Peak postprandial:   less than 180 mg/dL (1-2 hours)      Critically ill patients:  140 - 180 mg/dL   Reason for Visit: Insulin Pump  Diabetes history: Type 1 Outpatient Diabetes medications: Insulin Pump Current orders for Inpatient glycemic control: Lantus 20 units QD, Novolog moderate tidwc  64 y.o. 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 Buddy Duty for endocrinology F/U, now on an insulin pump who was admitted 05/20/14 with high blood sugars and severe myalgias.Upon initial evaluation in the ED, her blood glucose was 385, bicarbonate with an anion gap of 23. Started on insulin drip per DKA orders with GlucoStabilizer and transitioned to basal - bolus insulin. Pt will be calling insulin pump company this morning to do safety check since pt was in DKA. Started Lantus 20 units at 0500 this am. Pt to call pump instructor regarding resuming bolus insulin (correction and meal coverage) with no basal x 24 hours since Lantus 20 units is on board. Explained to patient that when this happens, patient will need to use all new supplies (reservoir/ insulin/ set/ site, etc) as this is hospital policy to use all new supplies when a patient is admitted with DKA. Patient agreeable and stated she can have her son bring extra pump supplies. Also instructed patient to call the 1-800# on the back of her pump to the manufacturer to run a safety check with her pump to assess for any problems.  Results for ZINIA, INNOCENT (MRN [id number]) as of 05/21/2014 11:14  Ref. Range 05/21/2014 04:05 05/21/2014 05:12 05/21/2014 06:16 05/21/2014 07:16 05/21/2014 10:53  Glucose-Capillary Latest Ref Range: 70-99 mg/dL 138 (H) 110 (H) 103  (H) 110 (H) 247 (H)     Results for EVALEEN, SANT (MRN [id number]) as of 05/21/2014 11:14  Ref. Range 05/21/2014 06:46  Sodium Latest Ref Range: 135-145 mmol/L 141  Potassium Latest Ref Range: 3.5-5.1 mmol/L 3.7  Chloride Latest Ref Range: 96-112 mmol/L 115 (H)  CO2 Latest Ref Range: 19-32 mmol/L 19  BUN Latest Ref Range: 6-23 mg/dL 16  Creatinine Latest Ref Range: 0.50-1.10 mg/dL 0.77  Calcium Latest Ref Range: 8.4-10.5 mg/dL 8.0 (L)  EGFR (Non-African Amer.) Latest Ref Range: >90 mL/min 86 (L)  EGFR (African American) Latest Ref Range: >90 mL/min >90  Glucose Latest Ref Range: 70-99 mg/dL 118 (H)  Anion gap Latest Ref Range: 5-15  7   Will f/u with pt at lunchtime for update from pump company. Paged Dr. Rockne Menghini and updated. Discussed with RN.  Thank you. Lorenda Peck, RD, LDN, CDE Inpatient Diabetes Coordinator [phone]

## 2014-05-22 LAB — CBC
HCT: 37 % (ref 36.0–46.0)
Hemoglobin: 12.5 g/dL (ref 12.0–15.0)
MCH: 29.9 pg (ref 26.0–34.0)
MCHC: 33.8 g/dL (ref 30.0–36.0)
MCV: 88.5 fL (ref 78.0–100.0)
Platelets: 187 10*3/uL (ref 150–400)
RBC: 4.18 MIL/uL (ref 3.87–5.11)
RDW: 13.5 % (ref 11.5–15.5)
WBC: 6.5 10*3/uL (ref 4.0–10.5)

## 2014-05-22 LAB — BASIC METABOLIC PANEL
Anion gap: 7 (ref 5–15)
BUN: 16 mg/dL (ref 6–23)
CHLORIDE: 112 mmol/L (ref 96–112)
CO2: 24 mmol/L (ref 19–32)
Calcium: 8.3 mg/dL — ABNORMAL LOW (ref 8.4–10.5)
Creatinine, Ser: 0.77 mg/dL (ref 0.50–1.10)
GFR calc Af Amer: >90 mL/min (ref >90)
GFR, EST NON AFRICAN AMERICAN: 86 mL/min — AB (ref >90)
Glucose, Bld: 172 mg/dL — ABNORMAL HIGH (ref 70–99)
POTASSIUM: 4.1 mmol/L (ref 3.5–5.1)
Sodium: 143 mmol/L (ref 135–145)

## 2014-05-22 LAB — GLUCOSE, CAPILLARY: GLUCOSE-CAPILLARY: 175 mg/dL — AB (ref 70–99)

## 2014-05-22 MED ORDER — HYDROCHLOROTHIAZIDE 12.5 MG PO CAPS
12.5000 mg | ORAL_CAPSULE | Freq: Every day | ORAL | Status: DC
  Administered 2014-05-22: 12.5 mg via ORAL
  Filled 2014-05-22: qty 1

## 2014-05-22 MED ORDER — TRAMADOL HCL 50 MG PO TABS: 100.0000 mg | ORAL_TABLET | Freq: Four times a day (QID) | ORAL | Status: DC | PRN

## 2014-05-22 MED ORDER — LOSARTAN POTASSIUM 50 MG PO TABS
50.0000 mg | ORAL_TABLET | Freq: Every day | ORAL | Status: DC
  Administered 2014-05-22: 50 mg via ORAL
  Filled 2014-05-22: qty 1

## 2014-05-22 MED ORDER — LOSARTAN POTASSIUM-HCTZ 50-12.5 MG PO TABS: 1.0000 | ORAL_TABLET | Freq: Every day | ORAL | Status: DC

## 2014-05-22 NOTE — Discharge Instructions (Signed)
Diabetic Ketoacidosis °Diabetic ketoacidosis (DKA) is a life-threatening complication of type 1 diabetes. It must be quickly recognized and treated. Treatment requires hospitalization. °CAUSES  °When there is no insulin in the body, glucose (sugar) cannot be used, and the body breaks down fat for energy. When fat breaks down, acids (ketones) build up in the blood. Very high levels of glucose and high levels of acids lead to severe loss of body fluids (dehydration) and other dangerous chemical changes. This stresses your vital organs and can cause coma or death. °SIGNS AND SYMPTOMS  °· Tiredness (fatigue). °· Weight loss. °· Excessive thirst. °· Ketones in your urine. °· Light-headedness. °· Fruity or sweet smelling breath. °· Excessive urination. °· Visual changes. °· Confusion or irritability. °· Nausea or vomiting. °· Rapid breathing. °· Stomachache or abdominal pain. °DIAGNOSIS  °Your health care provider will diagnose DKA based on your history, physical exam, and blood tests. The health care provider will check to see if you have another illness that caused you to go into DKA. Most of this will be done quickly in an emergency room. °TREATMENT  °· Fluid replacement to correct dehydration. °· Insulin. °· Correction of electrolytes, such as potassium and sodium. °· Antibiotic medicines. °PREVENTION °· Always take your insulin. Do not skip your insulin injections. °· If you are sick, treat yourself quickly. Your body often needs more insulin to fight the illness. °· Check your blood glucose regularly. °· Check urine ketones if your blood glucose is greater than 240 milligrams per deciliter (mg/dL). °· Do not use outdated (expired) insulin. °· If your blood glucose is high, drink plenty of fluids. This helps flush out ketones. °HOME CARE INSTRUCTIONS  °· If you are sick, follow the advice of your health care provider. °· To prevent dehydration, drink enough water and fluids to keep your urine clear or pale  yellow. °¨ If you cannot eat, alternate between drinking fluids with sugar (soda, juices, flavored gelatin) and salty fluids (broth, bouillon). °¨ If you can eat, follow your usual diet and drink sugar-free liquids (water, diet drinks). °· Always take your usual dose of insulin. If you cannot eat or if your glucose is getting too low, call your health care provider for further instructions. °· Continue to monitor your blood or urine ketones every 3-4 hours around the clock. Set your alarm clock or have someone wake you up. If you are too sick, have someone test it for you. °· Rest and avoid exercise. °SEEK MEDICAL CARE IF:  °· You have a fever. °· You have ketones in your urine, or your blood glucose is higher than a level your health care provider suggests. You may need extra insulin. Call your health care provider if you need advice on adjusting your insulin. °· You cannot drink at least a tablespoon (15 mL) of fluid every 15-20 minutes. °· You have been vomiting for more than 2 hours. °· You have symptoms of DKA: °¨ Fruity smelling breath. °¨ Breathing faster or slower. °¨ Becoming very sleepy. °SEEK IMMEDIATE MEDICAL CARE IF:  °· You have signs of dehydration: °¨ Decreased urination. °¨ Increased thirst. °¨ Dry skin and mouth. °¨ Light-headedness. °· Your blood glucose is very high (as advised by your health care provider) twice in a row. °· You faint. °· You have chest pain or trouble breathing. °· You have a sudden, severe headache. °· You have sudden weakness in one arm or one leg. °· You have sudden trouble speaking or swallowing. °· You   have vomiting or diarrhea that is getting worse after 3 hours. °· You have abdominal pain. °MAKE SURE YOU:  °· Understand these instructions. °· Will watch your condition. °· Will get help right away if you are not doing well or get worse. °Document Released: 01/10/2000 Document Revised: 01/17/2013 Document Reviewed: 07/18/2008 °Exit[employer]® Patient Information ©2015 [employer]  [employer]. This information is not intended to replace advice given to you by your health care provider. Make sure you discuss any questions you have with your health care provider. ° °

## 2014-05-22 NOTE — Plan of Care (Signed)
Problem: Discharge Progression Outcomes Goal: CBGs controlled on DM discharge meds Outcome: Adequate for Discharge Pt to see endocrinologist today at 12 noon

## 2014-05-22 NOTE — Discharge Summary (Signed)
Physician Discharge Summary  Katie Palmer PVV:[id number] DOB: October 09, 1950 DOA: 05/20/2014  PCP: Laurey Morale, MD  Admit date: 05/20/2014 Discharge date: 05/22/2014   Recommendations for Outpatient Follow-Up:   1. The patient will see Dr. Buddy Duty today for further evaluation of her glycemic control and adjustment of her insulin pump.   Discharge Diagnosis:   Principal Problem:    DKA, type 1 Active Problems:    Essential hypertension    Fibromyalgia    Leukocytosis    Dyslipidemia    Depression    AKI (acute kidney injury)  Discharge disposition:  Home.   Discharge Condition: Improved.  Diet recommendation:  Carbohydrate-modified.    History of Present Illness:   Katie Palmer is an 64 y.o. 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 Buddy Duty for endocrinology F/U, now on an insulin pump who was admitted 05/20/14 with high blood sugars and severe myalgias.Upon initial evaluation in the ED, her blood glucose was 385, bicarbonate with an anion gap of 23.  Hospital Course by Problem:   Principal Problem:  DKA, type 1 / Uncontrolled type I DM - Status post IV fluid bolus in the ER consisting of normal saline x2 liters, and aggressive fluid replacement. - Start on insulin drip per glucose stabilizer protocol with every hour CBG checks and every 4 hour BMET checks until stable. - DKA resolved overnigh 05/20/14 t, patient transitioned to basal/bolus insulin. - Potassium was supplemented and remains WNL. - DM coordinator consulted.  - Currently being managed with 20 units of Lantus daily and moderate scale SSI. CBGs 163-314. - Patient had a follow-up discharge appointment with Dr. Buddy Duty on the date of discharge, to evaluate her glycemic control and resume her insulin pump.  Active Problems:  AKI - Baseline creatinine 0.84. Renal function back to  baseline values with vigorous hydration.   Essential hypertension - Hyzaar on hold. Resume at discharge.   Fibromyalgia - Continue Ultram.   Leukocytosis - Likely from hemoconcentration. Resolved.   Dyslipidemia - Continue Lipitor.   Depression and anxiety - Continue Xanax.    Medical Consultants:    None.   Discharge Exam:   Filed Vitals:   05/22/14 0515  BP: 114/67  Pulse: 67  Temp: 97.8 F (36.6 C)  Resp: 16   Filed Vitals:   05/21/14 1200 05/21/14 1347 05/21/14 2121 05/22/14 0515  BP:  132/82 139/86 114/67  Pulse:  83 85 67  Temp: 98.1 F (36.7 C) 98 F (36.7 C) 98.3 F (36.8 C) 97.8 F (36.6 C)  TempSrc: Oral Oral Oral Oral  Resp:  18 20 16   Height:      Weight:      SpO2:  99% 99% 96%    Gen:  NAD Cardiovascular:  RRR, No M/R/G Respiratory: Lungs CTAB Gastrointestinal: Abdomen soft, NT/ND with normal active bowel sounds. Extremities: No C/E/C   The results of significant diagnostics from this hospitalization (including imaging, microbiology, ancillary and laboratory) are listed below for reference.     Procedures and Diagnostic Studies:   None.  Labs:   Basic Metabolic Panel:  Recent Labs Lab 05/20/14 1503  05/20/14 2250 05/21/14 0238 05/21/14 0646 05/21/14 1036 05/22/14 0510  NA 141  < > 138 138 141 138 143  K 4.3  < > 4.3 3.8 3.7 4.4 4.1  CL 111  < > 114* 115* 115* 110 112  CO2 14*  < > 16* 20 19 19 24   GLUCOSE 281*  < > 159* 201* 118* 279* 172*  BUN 27*  < > 22 18 16 17 16   CREATININE 1.01  < > 0.76 0.86 0.77 0.85 0.77  CALCIUM 8.5  < > 7.9* 7.9* 8.0* 8.2* 8.3*  MG 2.0  --   --   --   --   --   --   PHOS 4.1  --   --   --   --   --   --   < > = values in this interval not displayed. GFR Estimated Creatinine Clearance: 74.3 mL/min (by C-G formula based on Cr of 0.77). Liver Function Tests:  Recent Labs Lab 05/20/14 0925  AST 23  ALT 19  ALKPHOS 85  BILITOT 1.5*  PROT 7.4  ALBUMIN 4.4   CBC:  Recent  Labs Lab 05/20/14 0925 05/20/14 1503 05/21/14 0238 05/22/14 0510  WBC 13.3* 20.9* 11.3* 6.5  NEUTROABS 9.7*  --   --   --   HGB 15.8* 15.2* 12.6 12.5  HCT 45.6 46.8* 37.4 37.0  MCV 88.9 92.5 89.3 88.5  PLT 270 261 195 187   CBG:  Recent Labs Lab 05/21/14 1438 05/21/14 1656 05/21/14 1818 05/21/14 2119 05/22/14 0728  GLUCAP 314* 226* 163* 217* 175*   Microbiology Recent Results (from the past 240 hour(s))  MRSA PCR Screening     Status: None   Collection Time: 05/20/14  1:57 PM  Result Value Ref Range Status   MRSA by PCR NEGATIVE NEGATIVE Final    Comment:        The GeneXpert MRSA Assay (FDA approved for NASAL specimens only), is one component of a comprehensive MRSA colonization surveillance program. It is not intended to diagnose MRSA infection nor to guide or monitor treatment for MRSA infections.      Discharge Instructions:   Discharge Instructions    Call MD for:  extreme fatigue    Complete by:  As directed      Call MD for:  persistant nausea and vomiting    Complete by:  As directed      Call MD for:  severe uncontrolled pain    Complete by:  As directed      Call MD for:    Complete by:  As directed   Call Dr. Buddy Duty if you have 3 or more readings above 250.     Diet Carb Modified    Complete by:  As directed      Discharge instructions    Complete by:  As directed   It is VERY IMPORTANT that you follow up with a PCP on a regular basis.  Check your blood glucoses before each meal and at bedtime and maintain a log of your readings.  Bring this log with you when you follow up with your PCP so that he or she can adjust your insulin at your follow up visit.     Increase activity slowly    Complete by:  As directed             Medication List    TAKE these medications        ACCU-CHEK SOFTCLIX LANCETS lancets  Use as instructed     ALPRAZolam 1 MG tablet  Commonly known as:  XANAX  Take 1 tablet (1 mg total) by mouth at bedtime as needed  for sleep.     atorvastatin 10 MG tablet  Commonly  known as:  LIPITOR  Take 1 tablet (10 mg total) by mouth daily.     cyclobenzaprine 10 MG tablet  Commonly known as:  FLEXERIL  TAKE 1 TABLET BY MOUTH 3 TIMES DAILY AS NEEDED.     GLUCAGON EMERGENCY 1 MG injection  Generic drug:  glucagon  Use as directed for severe low blood sugar     glucose blood test strip  Use 3x a day - AccuCheck nano     HYDROcodone-acetaminophen 10-325 MG per tablet  Commonly known as:  NORCO  Take 1 tablet by mouth every 6 (six) hours as needed.     insulin pump Soln  Continue use of your insulin pump as instructed by Dr. Buddy Duty     losartan-hydrochlorothiazide 50-12.5 MG per tablet  Commonly known as:  HYZAAR  Take 1 tablet by mouth daily.     meclizine 25 MG tablet  Commonly known as:  ANTIVERT  Take 1 tablet (25 mg total) by mouth every 4 (four) hours as needed for dizziness.     meloxicam 15 MG tablet  Commonly known as:  MOBIC  Take 15 mg by mouth daily.     ondansetron 4 MG disintegrating tablet  Commonly known as:  ZOFRAN ODT  Take 1 tablet (4 mg total) by mouth every 8 (eight) hours as needed for nausea.     SUMAtriptan 100 MG tablet  Commonly known as:  IMITREX  Take 1 tablet (100 mg total) by mouth as needed for migraine or headache. May repeat in 2 hours if headache persists or recurs.     traMADol 50 MG tablet  Commonly known as:  ULTRAM  Take 2 tablets (100 mg total) by mouth every 6 (six) hours as needed.           Follow-up Information    Follow up with KERR,JEFFREY, MD Today.   Specialty:  Endocrinology   Why:  At your scheduled appt time.   Contact information:   [address] [place] [address] [phone]        Time coordinating discharge: 35 minutes.  Signed:  Lorice Lafave  Pager [phone] [facility] 05/22/2014, 5:07 PM

## 2014-05-30 ENCOUNTER — Ambulatory Visit: Payer: Medicare Other | Admitting: *Deleted

## 2014-06-01 ENCOUNTER — Ambulatory Visit (INDEPENDENT_AMBULATORY_CARE_PROVIDER_SITE_OTHER): Payer: Medicare Other | Admitting: Family Medicine

## 2014-06-01 ENCOUNTER — Encounter: Payer: Self-pay | Admitting: Family Medicine

## 2014-06-01 VITALS — BP 99/79 | HR 73 | Temp 98.2°F | Ht 64.5 in | Wt 185.0 lb

## 2014-06-01 DIAGNOSIS — I10 Essential (primary) hypertension: Secondary | ICD-10-CM | POA: Diagnosis not present

## 2014-06-01 DIAGNOSIS — M797 Fibromyalgia: Secondary | ICD-10-CM

## 2014-06-01 DIAGNOSIS — F329 Major depressive disorder, single episode, unspecified: Secondary | ICD-10-CM | POA: Diagnosis not present

## 2014-06-01 DIAGNOSIS — F32A Depression, unspecified: Secondary | ICD-10-CM

## 2014-06-01 DIAGNOSIS — E559 Vitamin D deficiency, unspecified: Secondary | ICD-10-CM

## 2014-06-01 DIAGNOSIS — IMO0001 Reserved for inherently not codable concepts without codable children: Secondary | ICD-10-CM | POA: Insufficient documentation

## 2014-06-01 DIAGNOSIS — E1065 Type 1 diabetes mellitus with hyperglycemia: Secondary | ICD-10-CM

## 2014-06-01 DIAGNOSIS — Z794 Long term (current) use of insulin: Secondary | ICD-10-CM

## 2014-06-01 DIAGNOSIS — E1139 Type 2 diabetes mellitus with other diabetic ophthalmic complication: Secondary | ICD-10-CM

## 2014-06-01 DIAGNOSIS — IMO0002 Reserved for concepts with insufficient information to code with codable children: Secondary | ICD-10-CM

## 2014-06-01 DIAGNOSIS — E785 Hyperlipidemia, unspecified: Secondary | ICD-10-CM

## 2014-06-01 LAB — CBC WITH DIFFERENTIAL/PLATELET
Basophils Absolute: 0.1 10*3/uL (ref 0.0–0.1)
Basophils Relative: 0.7 % (ref 0.0–3.0)
Eosinophils Absolute: 0.4 10*3/uL (ref 0.0–0.7)
Eosinophils Relative: 4.7 % (ref 0.0–5.0)
HCT: 44.2 % (ref 36.0–46.0)
HEMOGLOBIN: 14.9 g/dL (ref 12.0–15.0)
LYMPHS PCT: 25.6 % (ref 12.0–46.0)
Lymphs Abs: 2.2 10*3/uL (ref 0.7–4.0)
MCHC: 33.8 g/dL (ref 30.0–36.0)
MCV: 87.4 fl (ref 78.0–100.0)
MONOS PCT: 4.9 % (ref 3.0–12.0)
Monocytes Absolute: 0.4 10*3/uL (ref 0.1–1.0)
Neutro Abs: 5.4 10*3/uL (ref 1.4–7.7)
Neutrophils Relative %: 64.1 % (ref 43.0–77.0)
PLATELETS: 243 10*3/uL (ref 150.0–400.0)
RBC: 5.05 Mil/uL (ref 3.87–5.11)
RDW: 13.7 % (ref 11.5–15.5)
WBC: 8.5 10*3/uL (ref 4.0–10.5)

## 2014-06-01 LAB — BASIC METABOLIC PANEL
BUN: 17 mg/dL (ref 6–23)
CALCIUM: 9.2 mg/dL (ref 8.4–10.5)
CO2: 30 mEq/L (ref 19–32)
CREATININE: 0.74 mg/dL (ref 0.40–1.20)
Chloride: 104 mEq/L (ref 96–112)
GFR: 83.57 mL/min (ref >60.00)
Glucose, Bld: 120 mg/dL — ABNORMAL HIGH (ref 70–99)
Potassium: 4.2 mEq/L (ref 3.5–5.1)
Sodium: 138 mEq/L (ref 135–145)

## 2014-06-01 LAB — HEPATIC FUNCTION PANEL
ALK PHOS: 60 U/L (ref 39–117)
ALT: 16 U/L (ref 0–35)
AST: 20 U/L (ref 0–37)
Albumin: 3.7 g/dL (ref 3.5–5.2)
Bilirubin, Direct: 0.1 mg/dL (ref 0.0–0.3)
Total Bilirubin: 0.4 mg/dL (ref 0.2–1.2)
Total Protein: 6.7 g/dL (ref 6.0–8.3)

## 2014-06-01 LAB — LIPID PANEL
CHOLESTEROL: 170 mg/dL (ref 0–200)
HDL: 90.6 mg/dL (ref >39.00)
LDL Cholesterol: 68 mg/dL (ref 0–99)
NonHDL: 79.4
Total CHOL/HDL Ratio: 2
Triglycerides: 55 mg/dL (ref 0.0–149.0)
VLDL: 11 mg/dL (ref 0.0–40.0)

## 2014-06-01 LAB — POCT URINALYSIS DIPSTICK
BILIRUBIN UA: NEGATIVE
Glucose, UA: NEGATIVE
KETONES UA: NEGATIVE
LEUKOCYTES UA: NEGATIVE
NITRITE UA: NEGATIVE
PH UA: 6
PROTEIN UA: NEGATIVE
RBC UA: NEGATIVE
Spec Grav, UA: 1.025
Urobilinogen, UA: 0.2

## 2014-06-01 LAB — TSH: TSH: 1.92 u[IU]/mL (ref 0.35–4.50)

## 2014-06-01 LAB — VITAMIN D 25 HYDROXY (VIT D DEFICIENCY, FRACTURES): VITD: 19.08 ng/mL — ABNORMAL LOW (ref 30.00–100.00)

## 2014-06-01 MED ORDER — BUPROPION HCL ER (XL) 150 MG PO TB24: 150.0000 mg | ORAL_TABLET | Freq: Every day | ORAL | Status: DC

## 2014-06-01 MED ORDER — HYDROCODONE-ACETAMINOPHEN 10-325 MG PO TABS: 1.0000 | ORAL_TABLET | Freq: Four times a day (QID) | ORAL | Status: DC | PRN

## 2014-06-01 MED ORDER — TRAMADOL HCL 50 MG PO TABS: 100.0000 mg | ORAL_TABLET | ORAL | Status: DC | PRN

## 2014-06-01 NOTE — Progress Notes (Signed)
   Subjective:    Patient ID: Katie Palmer, female    DOB: [birth year], 64 y.o.   MRN: [id number]  HPI 64 yr old female to follow up a hospital stay from 05-20-14 to 05-22-14 for DKA. She is on an insulin pump and she is followed closely by Dr. Buddy Duty. Her HTN has been stable. She was on acute renal failure on admission but her renal status had returned to baseline prior to the DC home. She mentions feeling depressed lately with sadness, crying, and feeling hopeless at times. Appetite and sleep are stable.    Review of Systems  Constitutional: Positive for fatigue. Negative for activity change, appetite change and unexpected weight change.  HENT: Negative.   Eyes: Negative.   Respiratory: Negative.   Cardiovascular: Negative.   Gastrointestinal: Negative.   Genitourinary: Negative for dysuria, urgency, frequency, hematuria, flank pain, decreased urine volume, enuresis, difficulty urinating, pelvic pain and dyspareunia.  Musculoskeletal: Negative.   Skin: Negative.   Neurological: Negative.   Psychiatric/Behavioral: Positive for dysphoric mood and decreased concentration. Negative for suicidal ideas, hallucinations, behavioral problems, confusion, sleep disturbance, self-injury and agitation. The patient is not nervous/anxious and is not hyperactive.        Objective:   Physical Exam  Constitutional: She is oriented to person, place, and time. She appears well-developed and well-nourished. No distress.  HENT:  Head: Normocephalic and atraumatic.  Right Ear: External ear normal.  Left Ear: External ear normal.  Nose: Nose normal.  Mouth/Throat: Oropharynx is clear and moist. No oropharyngeal exudate.  Eyes: Conjunctivae and EOM are normal. Pupils are equal, round, and reactive to light. No scleral icterus.  Neck: Normal range of motion. Neck supple. No JVD present. No thyromegaly present.  Cardiovascular: Normal rate, regular rhythm, normal heart sounds and intact distal pulses.  Exam  reveals no gallop and no friction rub.   No murmur heard. Pulmonary/Chest: Effort normal and breath sounds normal. No respiratory distress. She has no wheezes. She has no rales. She exhibits no tenderness.  Abdominal: Soft. Bowel sounds are normal. She exhibits no distension and no mass. There is no tenderness. There is no rebound and no guarding.  Musculoskeletal: Normal range of motion. She exhibits no edema or tenderness.  Lymphadenopathy:    She has no cervical adenopathy.  Neurological: She is alert and oriented to person, place, and time. She has normal reflexes. No cranial nerve deficit. She exhibits normal muscle tone. Coordination normal.  Skin: Skin is warm and dry. No rash noted. No erythema.  Psychiatric: She has a normal mood and affect. Her behavior is normal. Judgment and thought content normal.          Assessment & Plan:  Her diabetes is followed by Dr. Buddy Duty. Her HTN is stable. We will check labs to day to follow her renal function and her lipids. She is depressed so we will try Wellbutrin XL 150 mg daily. Get fasting labs

## 2014-06-01 NOTE — Progress Notes (Signed)
Pre visit review using our clinic review tool, if applicable. No additional management support is needed unless otherwise documented below in the visit note. 

## 2014-06-05 ENCOUNTER — Encounter: Payer: Self-pay | Admitting: Family Medicine

## 2014-06-06 NOTE — Telephone Encounter (Signed)
Take OTC vitamin D for a total of 5000 units a day

## 2014-06-07 ENCOUNTER — Encounter: Payer: Medicare Other | Attending: Family Medicine | Admitting: *Deleted

## 2014-06-07 DIAGNOSIS — E1065 Type 1 diabetes mellitus with hyperglycemia: Secondary | ICD-10-CM | POA: Diagnosis present

## 2014-06-07 DIAGNOSIS — Z713 Dietary counseling and surveillance: Secondary | ICD-10-CM | POA: Insufficient documentation

## 2014-06-07 DIAGNOSIS — Z794 Long term (current) use of insulin: Secondary | ICD-10-CM | POA: Diagnosis not present

## 2014-06-07 DIAGNOSIS — IMO0002 Reserved for concepts with insufficient information to code with codable children: Secondary | ICD-10-CM

## 2014-06-07 NOTE — Patient Instructions (Addendum)
Plan: We have decreased your Basal Rate from MN to 8 AM from 0.65 to 0.60 to help with early AM low BG's We have decreased your Correction Factor from 50 to 55 to decrease low BG after correcting high BG's We have discussed steps to follow when BG is above 250 mg/dl:  Check BG 1-2 hours after giving correction to verify that you received that bolus  If not improved, give insulin by syringe or pen according to pump recommendation and change out the site  Consider getting foil wrapped keto stix that you can keep with your meter to test for ketones as needed  Consider keeping a syringe in your meter case too. Call the Dex[employer] Rep to let her know of your interest in CGM so she can advise on insurance options.

## 2014-06-07 NOTE — Progress Notes (Signed)
  Pump Follow Up Progress Note 06/07/14 via telephone  Orders received from MD  giving me permission to make insulin pump adjustments for this patient.  Reviewed blood glucose logs on 03/21/14 via: T-connects and found the following:        Average BG over past 2 weeks: improved from 263 to 156 mg/dl  She is having hypoglycemia frequently before breakfast Most of her low BG's later in the day are after correction doses that are bringing her below her Target Range.   Comments: I have reduced her Basal Rate overnight and reduced her Sensitivity Factor per below  Pump Settings: Date: Current Date: 06/07/14  Changes in bold    Basal Rate: Carb Ratio Sensitivity  Basal Rate: Carb Ratio Sensitivity   MN: 0.65 10 50 MN: 0.60  (-) 10 55 (-)  8 AM 0.75   8 AM 0.75                                                  We reviewed instructions for prevention of DKA in more detail today. I have recommended she carry a syringe in her meter case as well as [employer]. I have also recommended she attend the DM 1 Support Group so she can meet other people with DM 1 and pump users. She is interested in pursuing CGM and with her being on a Tandem pump I have suggested she contact Dexcom. I provided her with [facility]' phone number and provided a Dexcom packet.  Plan: We have decreased your Basal Rate from MN to 8 AM from 0.65 to 0.60 to help with early AM low BG's We have decreased your Correction Factor from 50 to 55 to decrease low BG after correcting high BG's We have discussed steps to follow when BG is above 250 mg/dl:  Check BG 1-2 hours after giving correction to verify that you received that bolus  If not improved, give insulin by syringe or pen according to pump recommendation and change out the site  Consider getting foil wrapped keto stix that you can keep with your meter to test for ketones as needed  Consider keeping a syringe in your meter case too. Call the Dexcom Rep to let  her know of your interest in CGM so she can advise on insurance options.   Follow up: Plan follow up visit within the next 2 weeks with me. She has additional information she wants to discuss with me regarding how she is coping with her Diabetes

## 2014-06-11 ENCOUNTER — Encounter: Payer: Self-pay | Admitting: Family Medicine

## 2014-06-13 MED ORDER — HYDROCODONE-ACETAMINOPHEN 10-325 MG PO TABS: 1.0000 | ORAL_TABLET | Freq: Four times a day (QID) | ORAL | Status: DC | PRN

## 2014-06-13 MED ORDER — TRAMADOL HCL 50 MG PO TABS: 100.0000 mg | ORAL_TABLET | ORAL | Status: DC | PRN

## 2014-06-13 MED ORDER — ALPRAZOLAM 1 MG PO TABS: 1.0000 mg | ORAL_TABLET | Freq: Every evening | ORAL | Status: DC | PRN

## 2014-06-13 NOTE — Telephone Encounter (Signed)
Print all 3 scripts for pt and she will need to pick them up. The pharmacy can call to get verification when pt turns in scripts.

## 2014-06-13 NOTE — Telephone Encounter (Signed)
Noted. Call in Xanax #60, Vicodin #60, and Tramadol #360 with one rf

## 2014-06-15 ENCOUNTER — Telehealth: Payer: Self-pay | Admitting: Family Medicine

## 2014-06-15 NOTE — Telephone Encounter (Signed)
I left a voice message for pt. There was 2 scripts printed on 1 page and another on 1 page. Advised pt to review scripts again.

## 2014-06-15 NOTE — Telephone Encounter (Signed)
Pt called back to say she found the rx

## 2014-06-15 NOTE — Telephone Encounter (Signed)
Pt's paper rx traMADol (ULTRAM) 50 MG tablet was not in the envelope . Pt openedd the envelope while her, and only 2 scripts . Pt was told the tramadol was called in to pharm.  Pt's tramadol was increased and now pt has had none for days. Pt  Is in a lot of pain. [facility]   Pt states pharm told her rx could not be filled until 5/23, but since this is an increase, can dr approve early to refill?

## 2014-06-20 LAB — HM DIABETES EYE EXAM

## 2014-06-27 ENCOUNTER — Ambulatory Visit: Payer: Medicare Other | Admitting: *Deleted

## 2014-07-09 ENCOUNTER — Encounter: Payer: Self-pay | Admitting: Family Medicine

## 2014-07-09 MED ORDER — BUPROPION HCL ER (XL) 300 MG PO TB24: 300.0000 mg | ORAL_TABLET | Freq: Every day | ORAL | Status: DC

## 2014-07-09 MED ORDER — TRAMADOL HCL 50 MG PO TABS: 100.0000 mg | ORAL_TABLET | ORAL | Status: DC | PRN

## 2014-07-09 MED ORDER — HYDROCODONE-ACETAMINOPHEN 10-325 MG PO TABS: 1.0000 | ORAL_TABLET | Freq: Four times a day (QID) | ORAL | Status: DC | PRN

## 2014-07-09 NOTE — Telephone Encounter (Signed)
All 3 rx are ready to be faxed

## 2014-07-10 ENCOUNTER — Telehealth: Payer: Self-pay | Admitting: Family Medicine

## 2014-07-10 NOTE — Telephone Encounter (Signed)
Per pt fax scripts for Wellbutrin & Tramadol to [facility] [phone]. I spoke with pt and faxed both scripts.

## 2014-07-10 NOTE — Telephone Encounter (Signed)
I faxed scripts for Wellbutrin & Tramadol to [facility]. Pt stated that she might need a prior authorization. Can you check into this?

## 2014-07-11 ENCOUNTER — Telehealth: Payer: Self-pay | Admitting: *Deleted

## 2014-07-11 NOTE — Telephone Encounter (Signed)
Once medication is processed by the pharmacy, if it needs a PA, the pharmacy will fax it over.

## 2014-07-11 NOTE — Telephone Encounter (Signed)
Per uploaded reports onto T-Connect, patient having hypoglycemia 16% of the time with lowest BG @ 38 mg/dl. She also complained of being alerted by pump to test her BG 90 minutes after each bolus resulting in testing up to 15 times per day. I instructed her to decrease her Basal Rates as follows: MN: 0.6 to 0.5 u/hr 8 AM: 0.75 to 0.65 u/hr = TDD from Basal reduced from 16.8 to 14.4 units / 24 hours  I also instructed her to change the post bolus BG check alert from 90 minutes to 2 hours and instructed her that the alert is just a reminder that is typically used only the first couple of weeks on a new pump. She wants to retain the alert but is pleased that it is extended to 2 hours.  Pt to contact me if she still continues to have hypoglycemia with the current settings.

## 2014-07-11 NOTE — Telephone Encounter (Signed)
We have the paperwork, waiting on provider to fill out and then we will call pt.

## 2014-07-18 ENCOUNTER — Encounter: Payer: Self-pay | Admitting: Family Medicine

## 2014-07-19 MED ORDER — TRAMADOL HCL 50 MG PO TABS: 100.0000 mg | ORAL_TABLET | ORAL | Status: DC | PRN

## 2014-07-19 MED ORDER — ALPRAZOLAM 1 MG PO TABS: 1.0000 mg | ORAL_TABLET | Freq: Every evening | ORAL | Status: DC | PRN

## 2014-07-19 NOTE — Telephone Encounter (Signed)
Can you check into the referral? I know we signed a form and faxed back.

## 2014-07-19 NOTE — Telephone Encounter (Signed)
Both are printed and ready to be faxed

## 2014-07-20 ENCOUNTER — Encounter: Payer: Self-pay | Admitting: Family Medicine

## 2014-07-20 NOTE — Telephone Encounter (Signed)
This is a duplicate, please see previous note.  

## 2014-07-20 NOTE — Telephone Encounter (Signed)
I spoke with pt and we are waiting on approval for the Ultram. I will talk with prior authorization department today and get back in touch with pt.

## 2014-07-20 NOTE — Telephone Encounter (Signed)
I spoke with pt and she [facility]. Pt will contact our office or the pharmacy when she needs refills. ( Ultram was for # 360 and Xanax was # 90 ). I shredded the last 2 scripts for this, pt did not need right now.

## 2014-07-23 ENCOUNTER — Other Ambulatory Visit: Payer: Self-pay

## 2014-07-25 ENCOUNTER — Telehealth: Payer: Self-pay | Admitting: Family Medicine

## 2014-07-25 NOTE — Telephone Encounter (Signed)
Per pt request add Invokana to drug allergy list. I did update this in pt's chart. Also pt checking on approval on Ultram for the quanity. We need to call local pharmacy.

## 2014-07-26 ENCOUNTER — Encounter: Payer: Self-pay | Admitting: Family Medicine

## 2014-07-26 DIAGNOSIS — G43001 Migraine without aura, not intractable, with status migrainosus: Secondary | ICD-10-CM

## 2014-07-27 MED ORDER — TRAMADOL HCL 50 MG PO TABS: 100.0000 mg | ORAL_TABLET | ORAL | Status: DC | PRN

## 2014-07-27 NOTE — Telephone Encounter (Signed)
I will refer her to Dr. Orlene Och, a headache specialist who does Botox injections

## 2014-07-27 NOTE — Telephone Encounter (Signed)
I spoke with pharmacy and they do not have a record of Ultram refill on file. Per. [name] reprint for # 360 with 5 refills. I spoke with pt and she will pick up script today, it is ready.

## 2014-08-07 ENCOUNTER — Other Ambulatory Visit: Payer: Self-pay | Admitting: Family Medicine

## 2014-08-14 ENCOUNTER — Encounter: Payer: Self-pay | Admitting: Family Medicine

## 2014-08-16 MED ORDER — HYDROCODONE-ACETAMINOPHEN 10-325 MG PO TABS: 1.0000 | ORAL_TABLET | Freq: Four times a day (QID) | ORAL | Status: DC | PRN

## 2014-08-16 NOTE — Telephone Encounter (Signed)
The rx for Vicodin is ready. This is nothing that I can really do about the timing of her appt with Dr. Melton Alar

## 2014-08-23 ENCOUNTER — Encounter: Payer: Self-pay | Admitting: Family Medicine

## 2014-08-24 NOTE — Telephone Encounter (Signed)
I called and spoke to a customer service representative at [facility] and was advised the medication does not require a PA.  A form has not been received as of yet either.

## 2014-08-27 ENCOUNTER — Other Ambulatory Visit: Payer: Self-pay | Admitting: Family Medicine

## 2014-09-02 ENCOUNTER — Other Ambulatory Visit: Payer: Self-pay | Admitting: Family Medicine

## 2014-09-05 ENCOUNTER — Encounter: Payer: Self-pay | Admitting: Family Medicine

## 2014-09-05 ENCOUNTER — Other Ambulatory Visit: Payer: Self-pay | Admitting: Family Medicine

## 2014-09-06 ENCOUNTER — Other Ambulatory Visit: Payer: Self-pay | Admitting: Family Medicine

## 2014-09-06 NOTE — Telephone Encounter (Signed)
We are waiting on approval from provider.

## 2014-09-06 NOTE — Telephone Encounter (Signed)
Can we refill this, maybe for a 1 month supply?

## 2014-09-06 NOTE — Telephone Encounter (Signed)
Just wanted you to see both messages.

## 2014-09-16 ENCOUNTER — Other Ambulatory Visit: Payer: Self-pay | Admitting: Family Medicine

## 2014-10-09 ENCOUNTER — Other Ambulatory Visit: Payer: Self-pay | Admitting: Family Medicine

## 2014-10-09 MED ORDER — BUPROPION HCL ER (XL) 300 MG PO TB24: 300.0000 mg | ORAL_TABLET | Freq: Every day | ORAL | Status: DC

## 2014-10-09 NOTE — Telephone Encounter (Signed)
[facility] Dr. [name].  Sent in 9 months to [facility] (90 day supply company) by e-scribe.

## 2014-10-09 NOTE — Addendum Note (Signed)
Addended by: Miles Costain T on: 10/09/2014 10:28 AM   Modules accepted: Orders

## 2014-10-15 ENCOUNTER — Encounter: Payer: Self-pay | Admitting: Family Medicine

## 2014-10-17 NOTE — Telephone Encounter (Signed)
The letter was written

## 2014-10-22 ENCOUNTER — Other Ambulatory Visit: Payer: Self-pay | Admitting: Family Medicine

## 2014-10-22 ENCOUNTER — Encounter: Payer: Self-pay | Admitting: Family Medicine

## 2014-10-24 MED ORDER — HYDROCODONE-ACETAMINOPHEN 10-325 MG PO TABS: 1.0000 | ORAL_TABLET | Freq: Four times a day (QID) | ORAL | Status: DC | PRN

## 2014-10-24 NOTE — Telephone Encounter (Signed)
done

## 2014-11-22 ENCOUNTER — Telehealth: Payer: Self-pay | Admitting: *Deleted

## 2014-11-22 NOTE — Telephone Encounter (Signed)
Patient states she has been receiving steroid injections for treatment for migraine headaches over the past several weeks. She states her BG's have been in the 300's with occasional BG's in the 200's. She states her current settings on her T-Slim insulin pump are:  Pump Settings: Date: Current Date: 11/22/14  Changes in bold print   Basal Rate: Carb Ratio Sensitivity  Basal Rate: Carb Ratio Sensitivity   MN: 0.50 8 55 MN: 0.60  (+) 8 55  8 AM 0.65   8 A:  0.75  (+)                                                  Plan: I instructed her to increase her basal rates per above, which are a 20% increase. She plans to call this office in the AM to make an appointment for further review of her BS's and to assess her settings further.

## 2014-11-29 ENCOUNTER — Encounter: Payer: Medicare Other | Attending: Internal Medicine | Admitting: *Deleted

## 2014-11-29 DIAGNOSIS — Z713 Dietary counseling and surveillance: Secondary | ICD-10-CM | POA: Insufficient documentation

## 2014-11-29 DIAGNOSIS — E109 Type 1 diabetes mellitus without complications: Secondary | ICD-10-CM | POA: Diagnosis not present

## 2014-11-29 DIAGNOSIS — IMO0001 Reserved for inherently not codable concepts without codable children: Secondary | ICD-10-CM

## 2014-11-29 DIAGNOSIS — E1065 Type 1 diabetes mellitus with hyperglycemia: Secondary | ICD-10-CM

## 2014-12-05 ENCOUNTER — Other Ambulatory Visit: Payer: Self-pay | Admitting: Family Medicine

## 2014-12-05 ENCOUNTER — Encounter: Payer: Self-pay | Admitting: Family Medicine

## 2014-12-05 ENCOUNTER — Encounter: Payer: Medicare Other | Admitting: *Deleted

## 2014-12-05 DIAGNOSIS — E109 Type 1 diabetes mellitus without complications: Secondary | ICD-10-CM | POA: Diagnosis not present

## 2014-12-05 DIAGNOSIS — E1065 Type 1 diabetes mellitus with hyperglycemia: Principal | ICD-10-CM

## 2014-12-05 DIAGNOSIS — IMO0001 Reserved for inherently not codable concepts without codable children: Secondary | ICD-10-CM

## 2014-12-05 NOTE — Patient Instructions (Signed)
First calibration is in 2 hours, will do 2 within 5 minutes of each other Calibrate at Bedtime, first thing in the AM and supper time (1 BG each time) Check BG before meals and bedtime, no longer between meals as the sensor will provide that information now. Enter any events such as Carbs, insulin doses as able  Return to this office by 5 PM next Tuesday, 11/15 please

## 2014-12-06 ENCOUNTER — Encounter: Payer: Self-pay | Admitting: *Deleted

## 2014-12-06 MED ORDER — HYDROCODONE-ACETAMINOPHEN 10-325 MG PO TABS: 1.0000 | ORAL_TABLET | Freq: Four times a day (QID) | ORAL | Status: DC | PRN

## 2014-12-06 NOTE — Progress Notes (Signed)
  Pump Follow Up Progress Note 11/29/14   Orders received from MD  giving me permission to make insulin pump adjustments for this patient.  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 HEr A1c has improved from 9.2 in June to 8.4%. She is here to learn more about the new Medtronic 630G insulin pump that is a bridge to the future 670G. She is currently on the Tandem T-Slim pump which she is pleased with. She states to upgrade to the Medtronic product would include a $750 out of pocket cost which would be a struggle for her financially. She would also like more information on the professional CGM that she would wear for one week and Medicare will pay for this diagnostic tool. She just saw Dr. Buddy Duty who made pump setting adjustments so I will not make any further adjustments today.  I explained the difference between professional and personal CGM. I explained that Medicare does not cover personal CGM at this time. IF she decides to pay for it out of pocket, either the Dexcom or Medtronic would have alerts for hyper and hypo glycemia. 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 So her financial limitations may indicate staying on her Tandem pump that is in warranty for 4 more years and adding the Dexcom CGM of she can afford the sensors.  She is interested in coming back to wear the professional CGM for a week to try it out and to provide Dr. Buddy Duty with the data to help him manage her pump settings more accurately. I also provided her with a list of Therapists in the area due to her high stress level with her unstable diabetes and also that her son and grandson live with her and do not contribute financially which adds a lot of stress too. She is  very interested in attending the DM 1 Support Group but it meets on a night she cannot attend. I explained that I am looking at changing the night from Thursday to Wednesday in January, we will see if that works out for her.  Plan: Come back in 1 week to wear the Professional Dexcom CGM Contact the therapist to initiate counseling Consider attending the DM 1 Support Group if we are able to switch to a night you can attend

## 2014-12-06 NOTE — Progress Notes (Signed)
Professional Dexcom Set Up on 12/05/14 Time arrived:1460  Time left: 1500 Patient here for placement of Dexcom Continuous Glucose Monitor   Explained to patient purpose of wearing the Dexcom per MD orders. They expressed verbal understanding.  Sensor inserted into skin at least 2 inches from any insulin injection sites. Patient instructed to check BG 2 times each day to calibrate. Explained to patient how to input onto [facility] Receiver all BG, food intake, exercise and any diabetes medications.  Patient instructed to return on Tuesday, November 15th for removal of sensor and Dexcom  Dexcom transmitter attached to sensor and taped down.  Patient informed that when Dexcom and sensor are connected, the system is waterproof and that they are to wear it consistently until they return to this office.  Patient to call this office if any questions or concerns prior to appointment to return the [facility] for downloading.

## 2014-12-06 NOTE — Telephone Encounter (Signed)
done

## 2014-12-07 ENCOUNTER — Telehealth: Payer: Self-pay | Admitting: Family Medicine

## 2014-12-07 NOTE — Telephone Encounter (Signed)
Refill request for Alprazolam 1 mg take 1 po qhs prn and [facility].

## 2014-12-07 NOTE — Telephone Encounter (Signed)
We did this yesterday

## 2014-12-07 NOTE — Telephone Encounter (Signed)
Call in #90 with one rf 

## 2014-12-10 ENCOUNTER — Ambulatory Visit: Payer: Medicare Other | Admitting: Podiatry

## 2014-12-10 ENCOUNTER — Other Ambulatory Visit: Payer: Self-pay | Admitting: Family Medicine

## 2014-12-10 MED ORDER — ALPRAZOLAM 1 MG PO TABS: ORAL_TABLET | ORAL | Status: DC

## 2014-12-10 NOTE — Telephone Encounter (Signed)
I called in script 

## 2014-12-14 ENCOUNTER — Encounter: Payer: Self-pay | Admitting: Podiatry

## 2014-12-14 ENCOUNTER — Ambulatory Visit (INDEPENDENT_AMBULATORY_CARE_PROVIDER_SITE_OTHER): Payer: Medicare Other | Admitting: Podiatry

## 2014-12-14 VITALS — BP 108/61 | HR 71 | Resp 16

## 2014-12-14 DIAGNOSIS — L600 Ingrowing nail: Secondary | ICD-10-CM

## 2014-12-14 DIAGNOSIS — B351 Tinea unguium: Secondary | ICD-10-CM | POA: Diagnosis not present

## 2014-12-14 DIAGNOSIS — M79676 Pain in unspecified toe(s): Secondary | ICD-10-CM | POA: Diagnosis not present

## 2014-12-14 NOTE — Progress Notes (Signed)
   Subjective:    Patient ID: Katie Palmer, female    DOB: 07/25/1950, 64 y.o.   MRN: [id number]  HPI    Review of Systems  Constitutional: Positive for fatigue and unexpected weight change.  Neurological: Positive for headaches.  All other systems reviewed and are negative.      Objective:   Physical Exam        Assessment & Plan:

## 2014-12-15 NOTE — Progress Notes (Signed)
Subjective:     Patient ID: Katie Palmer, female   DOB: 02-20-1950, 64 y.o.   MRN: [id number]  HPI patient presents with one-year history of type 1 diabetes with nail disease that she cannot take care of and is referred by family physician for treatment   Review of Systems  All other systems reviewed and are negative.      Objective:   Physical Exam  Constitutional: She is oriented to person, place, and time.  Cardiovascular: Intact distal pulses.   Musculoskeletal: Normal range of motion.  Neurological: She is oriented to person, place, and time.  Skin: Skin is warm and dry.  Nursing note and vitals reviewed.  neurovascular status found to be intact muscle strength was adequate range of motion within normal limits with patient found to have minimal plantar symptoms from the diabetes even though it has not been in good control. Patient has thick and nail disease 1-5 both feet that are painful and make it hard for her to walk and she cannot cut them herself due to the thickness and ingrowing component     Assessment:     At risk diabetic who is not in good control with long mycotic nail infections 1-5 both feet that are painful    Plan:     H&P and diabetic education rendered. Debrided nailbeds 1-5 both feet with no iatrogenic bleeding and instructed on daily inspections and reappoint in 10 weeks for routine care or earlier if any issues should occur

## 2014-12-16 ENCOUNTER — Other Ambulatory Visit: Payer: Self-pay | Admitting: Family Medicine

## 2014-12-31 ENCOUNTER — Encounter: Payer: Self-pay | Admitting: Family Medicine

## 2015-01-02 ENCOUNTER — Encounter: Payer: Self-pay | Admitting: Family Medicine

## 2015-01-02 MED ORDER — HYDROCODONE-ACETAMINOPHEN 10-325 MG PO TABS: 1.0000 | ORAL_TABLET | Freq: Four times a day (QID) | ORAL | Status: DC | PRN

## 2015-01-02 NOTE — Telephone Encounter (Signed)
She was given hydrocodone, not oxycodone, but yes we can refill this. This is ready in your basket. As for seeing her son, sorry I am too full so he would need to someone another provider here

## 2015-01-02 NOTE — Telephone Encounter (Signed)
Actually I can see both Katie Palmer and the friend. Please make an appt

## 2015-01-02 NOTE — Telephone Encounter (Signed)
Actually I changed my mind. I will see both Darnelle Maffucci and your friend. Please make an appt

## 2015-01-12 ENCOUNTER — Encounter: Payer: Self-pay | Admitting: Family Medicine

## 2015-01-14 NOTE — Telephone Encounter (Signed)
I wrote the last rx for #360 which is the UPPER LIMIT of what she can take in 24 hours. I will not approve any more than that

## 2015-02-07 ENCOUNTER — Other Ambulatory Visit: Payer: Self-pay | Admitting: Family Medicine

## 2015-02-08 NOTE — Telephone Encounter (Signed)
Call in Atorvastatin 10 mg #90 with 3 rf, Tramadol 50 mg #360 with 5 rf, and Meclizine 25 mg #180 with 3 rf

## 2015-02-08 NOTE — Telephone Encounter (Signed)
Refill as listed

## 2015-02-12 ENCOUNTER — Encounter: Payer: Self-pay | Admitting: Family Medicine

## 2015-02-13 NOTE — Telephone Encounter (Signed)
First of all, I am sure her insurance will not cover this and it is bound to be very expensive. More importantly, I am not comfortable prescribing this. It is brand new and there is very little data on its safety. I will not be writing for this for quite a while.

## 2015-02-22 ENCOUNTER — Ambulatory Visit: Payer: Medicare Other | Admitting: Podiatry

## 2015-03-05 ENCOUNTER — Encounter: Payer: Self-pay | Admitting: Family Medicine

## 2015-03-05 DIAGNOSIS — Z0000 Encounter for general adult medical examination without abnormal findings: Secondary | ICD-10-CM

## 2015-03-06 NOTE — Telephone Encounter (Signed)
I would recommend [facility], and they are all excellent. I can do a referral if she wishes

## 2015-03-10 ENCOUNTER — Other Ambulatory Visit: Payer: Self-pay | Admitting: Family Medicine

## 2015-03-14 NOTE — Telephone Encounter (Signed)
The referral was done  

## 2015-03-25 ENCOUNTER — Encounter: Payer: Self-pay | Admitting: Family Medicine

## 2015-03-26 ENCOUNTER — Encounter: Payer: Self-pay | Admitting: Family Medicine

## 2015-03-26 NOTE — Telephone Encounter (Signed)
She needs an OV for all this, we have not seen her in many months

## 2015-03-26 NOTE — Telephone Encounter (Signed)
This was already answered in a telephone call. She needs an OV

## 2015-03-27 ENCOUNTER — Other Ambulatory Visit: Payer: Self-pay

## 2015-03-27 DIAGNOSIS — Z1231 Encounter for screening mammogram for malignant neoplasm of breast: Secondary | ICD-10-CM

## 2015-03-28 ENCOUNTER — Other Ambulatory Visit: Payer: Self-pay | Admitting: Family Medicine

## 2015-04-04 ENCOUNTER — Telehealth: Payer: Self-pay | Admitting: Gastroenterology

## 2015-04-04 ENCOUNTER — Encounter: Payer: Self-pay | Admitting: Gastroenterology

## 2015-04-04 NOTE — Telephone Encounter (Signed)
Dr. Nandigam reviewed records and has accepted patient. Ok to schedule Direct Colon. Colonoscopy scheduled. °

## 2015-04-06 ENCOUNTER — Other Ambulatory Visit: Payer: Self-pay | Admitting: Family Medicine

## 2015-04-08 ENCOUNTER — Ambulatory Visit
Admission: RE | Admit: 2015-04-08 | Discharge: 2015-04-08 | Disposition: A | Discharge status: Home / Self Care | Payer: Medicare Other | Source: Ambulatory Visit

## 2015-04-08 DIAGNOSIS — Z1231 Encounter for screening mammogram for malignant neoplasm of breast: Secondary | ICD-10-CM

## 2015-04-10 ENCOUNTER — Other Ambulatory Visit: Payer: Self-pay | Admitting: Obstetrics and Gynecology

## 2015-04-10 DIAGNOSIS — R928 Other abnormal and inconclusive findings on diagnostic imaging of breast: Secondary | ICD-10-CM

## 2015-04-15 ENCOUNTER — Other Ambulatory Visit: Payer: Medicare Other

## 2015-04-16 DIAGNOSIS — M205X1 Other deformities of toe(s) (acquired), right foot: Secondary | ICD-10-CM

## 2015-04-17 ENCOUNTER — Other Ambulatory Visit: Payer: Medicare Other

## 2015-04-18 ENCOUNTER — Ambulatory Visit
Admission: RE | Admit: 2015-04-18 | Discharge: 2015-04-18 | Disposition: A | Discharge status: Home / Self Care | Payer: Medicare Other | Source: Ambulatory Visit | Attending: Obstetrics and Gynecology | Admitting: Obstetrics and Gynecology

## 2015-04-18 ENCOUNTER — Other Ambulatory Visit: Payer: Medicare Other

## 2015-04-18 DIAGNOSIS — R928 Other abnormal and inconclusive findings on diagnostic imaging of breast: Secondary | ICD-10-CM

## 2015-04-23 ENCOUNTER — Encounter: Payer: Self-pay | Admitting: Podiatry

## 2015-04-23 ENCOUNTER — Ambulatory Visit (INDEPENDENT_AMBULATORY_CARE_PROVIDER_SITE_OTHER): Payer: Medicare Other | Admitting: Podiatry

## 2015-04-23 DIAGNOSIS — M205X1 Other deformities of toe(s) (acquired), right foot: Secondary | ICD-10-CM | POA: Diagnosis not present

## 2015-04-23 DIAGNOSIS — B351 Tinea unguium: Secondary | ICD-10-CM

## 2015-04-23 DIAGNOSIS — M79676 Pain in unspecified toe(s): Secondary | ICD-10-CM

## 2015-04-23 MED ORDER — MELOXICAM 15 MG PO TABS: 15.0000 mg | ORAL_TABLET | Freq: Every day | ORAL | Status: DC

## 2015-04-23 NOTE — Progress Notes (Signed)
Patient ID: Katie Palmer, female   DOB: [birth year], 65 y.o.   MRN: [id number] Complaint:  Visit Type: Patient returns to my office for continued preventative foot care services. Complaint: Patient states" my nails have grown long and thick and become painful to walk and wear shoes" Patient has been diagnosed with DM with no foot complications. The patient presents for preventative foot care services. No changes to ROS.  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  She also has pain in her big toe joint right foot in her big toe.  Podiatric Exam: Vascular: dorsalis pedis and posterior tibial pulses are palpable bilateral. Capillary return is immediate. Temperature gradient is WNL. Skin turgor WNL  Sensorium: Normal Semmes Weinstein monofilament test. Normal tactile sensation bilaterally. Nail Exam: Pt has thick disfigured discolored nails with subungual debris noted bilateral entire nail hallux through fifth toenails Ulcer Exam: There is no evidence of ulcer or pre-ulcerative changes or infection. Orthopedic Exam: Muscle tone and strength are WNL. No limitations in general ROM. No crepitus or effusions noted. Foot type and digits show no abnormalities. Bony prominences are unremarkable. 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 Skin: No Porokeratosis. 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  Treatment & Plan Procedures and Treatment: Consent by patient was obtained for treatment procedures. The patient understood the discussion of treatment and procedures well. All questions were answered thoroughly reviewed. Debridement of mycotic and hypertrophic toenails, 1 through 5 bilateral and clearing of subungual debris. No ulceration, no infection noted. Prescribed Mobic.  Recommended powerstep insoles. Return Visit-Office Procedure: Patient instructed to return to the office for a follow up visit 3 months for continued  nail care.  RTC prn for arthritis big toe joint right foot.    Gardiner Barefoot DPM

## 2015-05-29 ENCOUNTER — Telehealth: Payer: Self-pay | Admitting: *Deleted

## 2015-05-29 ENCOUNTER — Ambulatory Visit (AMBULATORY_SURGERY_CENTER): Payer: Self-pay | Admitting: *Deleted

## 2015-05-29 VITALS — Ht 65.0 in | Wt 210.0 lb

## 2015-05-29 DIAGNOSIS — Z86010 Personal history of colonic polyps: Secondary | ICD-10-CM

## 2015-05-29 MED ORDER — NA SULFATE-K SULFATE-MG SULF 17.5-3.13-1.6 GM/177ML PO SOLN: ORAL | Status: DC

## 2015-05-29 NOTE — Telephone Encounter (Signed)
Patient is for colonoscopy with Dr.Nandigam on 06-07-15 at 10:30 am. She has insulin pump managed by Dr.Kerr. Please send note to MD. Notified patient that we will call her with doctors recommendations. Thank you!

## 2015-05-29 NOTE — Progress Notes (Signed)
Patient denies any allergies to eggs or soy. Patient denies any problems with anesthesia/sedation. Patient denies any oxygen use at home and does not take any diet/weight loss medications. Patient declined EMMI education. Patient has insulin pump, sent note to [facility].

## 2015-05-30 ENCOUNTER — Telehealth: Payer: Self-pay | Admitting: *Deleted

## 2015-05-30 NOTE — Telephone Encounter (Signed)
Sent letter to Dr Buddy Duty today

## 2015-05-30 NOTE — Telephone Encounter (Signed)
  05/30/2015    RE: Katie Palmer DOB: 1950/03/27 MRN: [id number]  Dr Buddy Duty,    Please advise on insulin pump, patients endoscopy procedure which is scheduled on 06/07/2015.     Genella Mech, CMA

## 2015-05-30 NOTE — Telephone Encounter (Signed)
See other telephone encounter.

## 2015-05-31 ENCOUNTER — Ambulatory Visit: Payer: Medicare Other | Admitting: Family Medicine

## 2015-06-03 ENCOUNTER — Encounter: Payer: Self-pay | Admitting: Family Medicine

## 2015-06-03 ENCOUNTER — Ambulatory Visit (INDEPENDENT_AMBULATORY_CARE_PROVIDER_SITE_OTHER): Payer: Medicare Other | Admitting: Family Medicine

## 2015-06-03 VITALS — BP 111/85 | HR 70 | Temp 98.0°F | Ht 65.0 in | Wt 211.0 lb

## 2015-06-03 DIAGNOSIS — E1010 Type 1 diabetes mellitus with ketoacidosis without coma: Secondary | ICD-10-CM | POA: Diagnosis not present

## 2015-06-03 DIAGNOSIS — M797 Fibromyalgia: Secondary | ICD-10-CM

## 2015-06-03 DIAGNOSIS — I10 Essential (primary) hypertension: Secondary | ICD-10-CM

## 2015-06-03 DIAGNOSIS — E559 Vitamin D deficiency, unspecified: Secondary | ICD-10-CM

## 2015-06-03 DIAGNOSIS — Z0000 Encounter for general adult medical examination without abnormal findings: Secondary | ICD-10-CM

## 2015-06-03 DIAGNOSIS — R9431 Abnormal electrocardiogram [ECG] [EKG]: Secondary | ICD-10-CM

## 2015-06-03 DIAGNOSIS — R42 Dizziness and giddiness: Secondary | ICD-10-CM

## 2015-06-03 DIAGNOSIS — R269 Unspecified abnormalities of gait and mobility: Secondary | ICD-10-CM

## 2015-06-03 LAB — VITAMIN D 25 HYDROXY (VIT D DEFICIENCY, FRACTURES): VITD: 23.03 ng/mL — ABNORMAL LOW (ref 30.00–100.00)

## 2015-06-03 LAB — CBC WITH DIFFERENTIAL/PLATELET
BASOS PCT: 0.4 % (ref 0.0–3.0)
Basophils Absolute: 0 10*3/uL (ref 0.0–0.1)
EOS PCT: 3.7 % (ref 0.0–5.0)
Eosinophils Absolute: 0.3 10*3/uL (ref 0.0–0.7)
HCT: 42 % (ref 36.0–46.0)
Hemoglobin: 14.2 g/dL (ref 12.0–15.0)
LYMPHS ABS: 1.8 10*3/uL (ref 0.7–4.0)
Lymphocytes Relative: 23.8 % (ref 12.0–46.0)
MCHC: 33.9 g/dL (ref 30.0–36.0)
MCV: 87.4 fl (ref 78.0–100.0)
MONO ABS: 0.4 10*3/uL (ref 0.1–1.0)
MONOS PCT: 5.4 % (ref 3.0–12.0)
NEUTROS ABS: 5.1 10*3/uL (ref 1.4–7.7)
NEUTROS PCT: 66.7 % (ref 43.0–77.0)
PLATELETS: 204 10*3/uL (ref 150.0–400.0)
RBC: 4.8 Mil/uL (ref 3.87–5.11)
RDW: 12.9 % (ref 11.5–15.5)
WBC: 7.6 10*3/uL (ref 4.0–10.5)

## 2015-06-03 LAB — BASIC METABOLIC PANEL
BUN: 21 mg/dL (ref 6–23)
CHLORIDE: 101 meq/L (ref 96–112)
CO2: 31 meq/L (ref 19–32)
CREATININE: 0.92 mg/dL (ref 0.40–1.20)
Calcium: 9.1 mg/dL (ref 8.4–10.5)
GFR: 64.8 mL/min (ref >60.00)
Glucose, Bld: 110 mg/dL — ABNORMAL HIGH (ref 70–99)
POTASSIUM: 3.7 meq/L (ref 3.5–5.1)
Sodium: 139 mEq/L (ref 135–145)

## 2015-06-03 LAB — POC URINALSYSI DIPSTICK (AUTOMATED)
BILIRUBIN UA: NEGATIVE
Blood, UA: NEGATIVE
Glucose, UA: NEGATIVE
KETONES UA: NEGATIVE
Nitrite, UA: NEGATIVE
Protein, UA: NEGATIVE
Urobilinogen, UA: 0.2
pH, UA: 5

## 2015-06-03 LAB — LIPID PANEL
CHOL/HDL RATIO: 2
Cholesterol: 174 mg/dL (ref 0–200)
HDL: 84 mg/dL (ref >39.00)
LDL Cholesterol: 77 mg/dL (ref 0–99)
NONHDL: 89.69
TRIGLYCERIDES: 63 mg/dL (ref 0.0–149.0)
VLDL: 12.6 mg/dL (ref 0.0–40.0)

## 2015-06-03 LAB — HEPATIC FUNCTION PANEL
ALT: 15 U/L (ref 0–35)
AST: 18 U/L (ref 0–37)
Albumin: 3.8 g/dL (ref 3.5–5.2)
Alkaline Phosphatase: 68 U/L (ref 39–117)
BILIRUBIN TOTAL: 0.5 mg/dL (ref 0.2–1.2)
Bilirubin, Direct: 0.1 mg/dL (ref 0.0–0.3)
Total Protein: 6.4 g/dL (ref 6.0–8.3)

## 2015-06-03 LAB — HEMOGLOBIN A1C: HEMOGLOBIN A1C: 7.1 % — AB (ref 4.6–6.5)

## 2015-06-03 LAB — TSH: TSH: 2.47 u[IU]/mL (ref 0.35–4.50)

## 2015-06-03 MED ORDER — HYDROCODONE-ACETAMINOPHEN 10-325 MG PO TABS: 1.0000 | ORAL_TABLET | Freq: Three times a day (TID) | ORAL | Status: DC | PRN

## 2015-06-03 NOTE — Progress Notes (Signed)
Pre visit review using our clinic review tool, if applicable. No additional management support is needed unless otherwise documented below in the visit note. 

## 2015-06-03 NOTE — Progress Notes (Signed)
   Subjective:    Patient ID: Katie Palmer, female    DOB: 06-06-1950, 65 y.o.   MRN: [id number]  HPI 65 yr old female to follow up on numerous issues. Her BP is stable. She says her glucoses are stable but she wants to see a new Endocrinologist. She has been seeing Dr. Buddy Duty but would like a new perspective. Her fibromyalgia continues to be a constant issue, and she takes Tramadol daily for pain. She asks for another small supply of hydrocodone to use for extra bad days. She tries to walk a little for exercise but her balance has become a problem. She finds herself constantly losing her balance, though she has not yet fallen.    Review of Systems  Constitutional: Negative.   HENT: Negative.   Eyes: Negative.   Respiratory: Negative.   Cardiovascular: Negative.   Gastrointestinal: Negative.   Genitourinary: Negative for dysuria, urgency, frequency, hematuria, flank pain, decreased urine volume, enuresis, difficulty urinating, pelvic pain and dyspareunia.  Musculoskeletal: Positive for myalgias, back pain, arthralgias and gait problem. Negative for joint swelling, neck pain and neck stiffness.  Skin: Negative.   Neurological: Negative.   Psychiatric/Behavioral: Negative.        Objective:   Physical Exam  Constitutional: She is oriented to person, place, and time. She appears well-developed and well-nourished. No distress.  HENT:  Head: Normocephalic and atraumatic.  Right Ear: External ear normal.  Left Ear: External ear normal.  Nose: Nose normal.  Mouth/Throat: Oropharynx is clear and moist. No oropharyngeal exudate.  Eyes: Conjunctivae and EOM are normal. Pupils are equal, round, and reactive to light. No scleral icterus.  Neck: Normal range of motion. Neck supple. No JVD present. No thyromegaly present.  Cardiovascular: Normal rate, regular rhythm, normal heart sounds and intact distal pulses.  Exam reveals no gallop and no friction rub.   No murmur heard. EKG shows NSR but has  new RBBB  Pulmonary/Chest: Effort normal and breath sounds normal. No respiratory distress. She has no wheezes. She has no rales. She exhibits no tenderness.  Abdominal: Soft. Bowel sounds are normal. She exhibits no distension and no mass. There is no tenderness. There is no rebound and no guarding.  Musculoskeletal: Normal range of motion. She exhibits no edema or tenderness.  Lymphadenopathy:    She has no cervical adenopathy.  Neurological: She is alert and oriented to person, place, and time. She has normal reflexes. No cranial nerve deficit. She exhibits normal muscle tone. Coordination normal.  Skin: Skin is warm and dry. No rash noted. No erythema.  Psychiatric: She has a normal mood and affect. Her behavior is normal. Judgment and thought content normal.          Assessment & Plan:  Her fibromyalgia seems to be stable so we refilled her meds. She will exercise as she tolerates it. Send to PT for balance training. Get fasting labs to check her lipids among other things. Refer to Dr. Chalmers Cater to deal with the diabetes. Refer to Cardiology for the new found RBBB. Her HTN is stable. She has a colonoscopy coming up later this week.  Laurey Morale, MD

## 2015-06-07 ENCOUNTER — Encounter: Payer: Medicare Other | Admitting: Gastroenterology

## 2015-06-07 ENCOUNTER — Telehealth: Payer: Self-pay | Admitting: Gastroenterology

## 2015-06-07 NOTE — Telephone Encounter (Signed)
Patient threw up her morning prep. Did not have bm this am. Had bm last night,but doesn't think that she is cleaned out.   Patient states that she called the answering service twice and no one ever called her back.  I will consult Dr. Silverio Decamp, and call her back.  Dr. Silverio Decamp wants her to re-schedule.   I will try to get her a coupon for a free prep.   The message for Dr. Silverio Decamp came through at 0750.

## 2015-06-11 ENCOUNTER — Encounter: Payer: Medicare Other | Attending: Family Medicine | Admitting: *Deleted

## 2015-06-11 DIAGNOSIS — E108 Type 1 diabetes mellitus with unspecified complications: Secondary | ICD-10-CM

## 2015-06-11 DIAGNOSIS — E119 Type 2 diabetes mellitus without complications: Secondary | ICD-10-CM | POA: Diagnosis not present

## 2015-06-11 DIAGNOSIS — E1065 Type 1 diabetes mellitus with hyperglycemia: Secondary | ICD-10-CM

## 2015-06-11 DIAGNOSIS — IMO0002 Reserved for concepts with insufficient information to code with codable children: Secondary | ICD-10-CM

## 2015-06-12 ENCOUNTER — Ambulatory Visit: Payer: Medicare Other

## 2015-06-12 ENCOUNTER — Encounter: Payer: Medicare Other | Admitting: Gastroenterology

## 2015-06-12 NOTE — Telephone Encounter (Signed)
Patient has cancelled procedure and has not rescheduled

## 2015-06-14 NOTE — Progress Notes (Signed)
CGM Training:  Appt start time: 1400 end time:1530.  Assessment:  Primary concerns today: Patient here for initiation of Dexcom Continuous Glucose Monitoring. She has gotten approval for Medicare to cover the Dexcom CGM!  Medications: see list     Intervention:   Understanding Glucose Sensing Programming Sensor Information  High Glucose: OFF for now  Low Glucose 80 mg/dl  Other settings to be added at follow up visit Starting [employer]  Entering BG, Calibration Technique and Graphs with [employer] and Reports Basic Care Troubleshooting Site and Alarms  Patient had some bleeding at the site, which I explained was not typically a problem for sensor use. She called an hour after leaving the office and said the bleeding caused the tape to come off, so she planned to insert another sensor at home and restart.    Follow Up Patient to see MD for insulin dose adjustments and to schedule visit with me for CGM follow up within 1-2 week(s).

## 2015-06-19 ENCOUNTER — Encounter: Payer: Self-pay | Admitting: Family Medicine

## 2015-06-19 DIAGNOSIS — M25512 Pain in left shoulder: Principal | ICD-10-CM

## 2015-06-19 DIAGNOSIS — M25511 Pain in right shoulder: Secondary | ICD-10-CM

## 2015-06-19 NOTE — Telephone Encounter (Signed)
The referral was done  

## 2015-06-20 ENCOUNTER — Ambulatory Visit: Payer: Medicare Other | Admitting: Cardiology

## 2015-06-28 NOTE — Addendum Note (Signed)
Addended by: Alysia Penna A on: 06/28/2015 05:03 PM   Modules accepted: Orders

## 2015-07-01 ENCOUNTER — Encounter: Payer: Self-pay | Admitting: Family Medicine

## 2015-07-01 ENCOUNTER — Ambulatory Visit: Payer: Medicare Other | Attending: Family Medicine

## 2015-07-01 DIAGNOSIS — M25512 Pain in left shoulder: Secondary | ICD-10-CM | POA: Diagnosis present

## 2015-07-01 DIAGNOSIS — M25511 Pain in right shoulder: Secondary | ICD-10-CM | POA: Diagnosis not present

## 2015-07-01 DIAGNOSIS — M6281 Muscle weakness (generalized): Secondary | ICD-10-CM | POA: Diagnosis present

## 2015-07-01 DIAGNOSIS — M25611 Stiffness of right shoulder, not elsewhere classified: Secondary | ICD-10-CM | POA: Diagnosis present

## 2015-07-01 LAB — HM DIABETES EYE EXAM

## 2015-07-01 NOTE — Patient Instructions (Signed)
Hold all stretches 5-10 seconds and perform 5-10 times, 3 times a day.   Slide right arm up wall, with  PALM FACING THE WALL, by leaning toward wall.  Copyright  VHI. All rights reserved.   Sitting upright, slide forearm forward along table, bending from the waist until a stretch is felt. Copyright  VHI. All rights reserved.    Clasp hands together and raise arms above head, keeping elbows as straight as possible. Can be done sitting or lying.   Copyright  VHI. All rights reserved.    [facility] [address] [place], [place] [address] Phone # [phone] Fax [phone]

## 2015-07-01 NOTE — Therapy (Signed)
[facility] [address], [place], [place], [address] Phone: [phone]   Fax:  [phone]  Physical Therapy Evaluation  Patient Details  Name: Katie Palmer MRN: [id number] Date of Birth: [birth year] Referring Provider: Alysia Penna, MD  Encounter Date: 07/01/2015      PT End of Session - 07/01/15 1223    Visit Number 1   Number of Visits 10   Date for PT Re-Evaluation 08/26/15   PT Start Time 1159  15 minutes late   PT Stop Time 1230   PT Time Calculation (min) 31 min   Activity Tolerance Patient tolerated treatment well   Behavior During Therapy [facility] for tasks assessed/performed      Past Medical History  Diagnosis Date  . Hx of colonic polyps   . Depression   . Headache(784.0)   . Hypertension   . Insomnia   . Fibromyalgia   . Diabetes mellitus without complication ([place])   . Vertigo     Past Surgical History  Procedure Laterality Date  . Pulmonary stress test      Normal on 02/04/09. Sees Dr. Philis Pique for GYN exams  . Tonsillectomy    . Colonoscopy  Feb. 2012    per GI in [place], clear, repeat in 5 yrs   . Diagnostic mammogram      01/27/2008. normal    There were no vitals filed for this visit.       Subjective Assessment - 07/01/15 1203    Subjective Pt is a Rt hand dominant female who presents to PT iwth Rt>Lt shouldre pain and Lt>Rt hand pain that began ~6 months ago without injury.  Pt has history of fibromyalgia and widespread OA.     Pertinent History fibromyalgia   Diagnostic tests none   Currently in Pain? Yes   Pain Score 4   up to 10/10 with raising arm overhead   Pain Location Shoulder   Pain Orientation Right   Pain Descriptors / Indicators Sore;Shooting   Pain Type Chronic pain   Pain Onset More than a month ago   Pain Frequency Constant   Aggravating Factors  raising Rt arm overhead, gripping objects with hands (weakness), use of Rt UE   Pain Relieving Factors not  raising arm overhead            [facility] PT Assessment - 07/01/15 0001    Assessment   Medical Diagnosis Pain in both shoulder joints   Referring Provider Alysia Penna, MD   Onset Date/Surgical Date 12/31/14   Hand Dominance Right   Next MD Visit none   Precautions   Precautions None   Restrictions   Weight Bearing Restrictions No   Balance Screen   Has the patient fallen in the past 6 months No   Has the patient had a decrease in activity level because of a fear of falling?  No   Is the patient reluctant to leave their home because of a fear of falling?  No   Home Ecologist residence   Prior Function   Level of Independence Independent   Vocation Full time employment   Vocation Requirements desk work- Occupational hygienist   Leisure bowling   Cognition   Overall Cognitive Status Within Functional Limits for tasks assessed   Observation/Other Assessments   Focus on Therapeutic Outcomes (FOTO)  50% limitation   ROM / Strength   AROM / PROM / Strength AROM;PROM;Strength   AROM  Overall AROM  Deficits   Overall AROM Comments Lt shoulder AROM is full without pain   AROM Assessment Site Shoulder   Right/Left Shoulder Right   Right Shoulder Flexion 150 Degrees   Right Shoulder ABduction 93 Degrees   Right Shoulder Internal Rotation --  lacking 8 inches vs the Lt   Right Shoulder External Rotation --  Rt=Lt   PROM   Overall PROM  Unable to assess   Overall PROM Comments Pt with vertigo and not able to get into supine today   Strength   Overall Strength Deficits   Overall Strength Comments Lt shoulder 5/5, Rt shoulder 4+/5    Strength Assessment Site Shoulder;Hand   Right/Left Shoulder Right   Right Shoulder Flexion 4+/5   Right Shoulder ABduction 4-/5   Right Shoulder Internal Rotation 4+/5   Right Shoulder External Rotation 4-/5   Right/Left hand Right;Left   Right Hand Grip (lbs) 52   Left Hand Grip (lbs) 15   Palpation   Palpation  comment palpable tenderness over Rt supraspinatus tendon                           PT Education - 07/01/15 1223    Education provided Yes   Education Details HEP: shoulder AAROM   Person(s) Educated Patient   Methods Explanation;Demonstration;Handout   Comprehension Verbalized understanding;Returned demonstration          PT Short Term Goals - 07/01/15 1228    PT SHORT TERM GOAL #1   Title be independent in initial HEP   Time 4   Period Weeks   Status New   PT SHORT TERM GOAL #2   Title report a 30% reduction in Rt shoulder pain with use overhead   Time 4   Period Weeks   Status New   PT SHORT TERM GOAL #3   Title demonstrate Rt shoulder IR to lacking < or = to 5 inches vs the Lt to improve dressing   Time 4   Period Weeks   Status New   PT SHORT TERM GOAL #4   Title demonstrate Lt grip to 30# to reduce dropping objects   Time 4   Period Weeks   Status New           PT Long Term Goals - 07/01/15 1153    PT LONG TERM GOAL #1   Title be independent in advanced HEP   Time 8   Period Weeks   Status New   PT LONG TERM GOAL #2   Title reduce FOTO to < or = to 37% limitation   Time 8   Period Weeks   Status New   PT LONG TERM GOAL #3   Title report a 60% reduction in Rt shoulder pain with use overhead   Time 8   Period Weeks   Status New   PT LONG TERM GOAL #4   Title improve Lt grip strength to > or = to 40# to reduce dropping objects   Time 8   Period Weeks   Status New   PT LONG TERM GOAL #5   Title demonstrate Rt shoulder IR to lacking < or = to 3 inches to improve self-care   Time 8   Period Weeks   Status New               Plan - 07/01/15 1308    Clinical Impression Statement Pt presents to PT with compliatins  of Rt>Lt shoulder pain and Lt>Rt hand pain and weakness that began ~6 months ago without cause.  Pt demonstrates painful and limited Rt shoulder AROM and strength.  FOTO is 50% limitation.  Pt with Lt>Rt hand  weakness.  Pt will benefit from skilled PT for Rt shoulder AROM, strength, and bil. hand grip strength to improve use overhead and to reduce dropping objects.     Rehab Potential Good   PT Frequency 2x / week   PT Duration 8 weeks   PT Treatment/Interventions ADLs/Self Care Home Management;Cryotherapy;Electrical Stimulation;Moist Heat;Iontophoresis 4mg /ml Dexamethasone;Therapeutic exercise;Therapeutic activities;Ultrasound;Neuromuscular re-education;Patient/family education;Manual techniques;Dry needling;Passive range of motion   PT Next Visit Plan Bil grip strength, Rt shoulder ROM and gentle strength, manual and modalities   Consulted and Agree with Plan of Care Patient      Patient will benefit from skilled therapeutic intervention in order to improve the following deficits and impairments:  Pain, Postural dysfunction, Decreased strength, Impaired flexibility, Decreased activity tolerance, Decreased endurance, Decreased range of motion  Visit Diagnosis: Pain in right shoulder - Plan: PT plan of care cert/re-cert  Pain in left shoulder - Plan: PT plan of care cert/re-cert  Stiffness of right shoulder, not elsewhere classified - Plan: PT plan of care cert/re-cert  Muscle weakness (generalized) - Plan: PT plan of care cert/re-cert      G-Codes - [id number] 05/22/07    Functional Assessment Tool Used FOTO: 50% limitation   Functional Limitation Other PT primary   Other PT Primary Current Status [id number]) At least 40 percent but less than 60 percent impaired, limited or restricted   Other PT Primary Goal Status [id number]) At least 20 percent but less than 40 percent impaired, limited or restricted       Problem List Patient Active Problem List   Diagnosis Date Noted  . Hypertension 06/03/2015  . Diabetes mellitus type 1, uncontrolled ([place]) 06/01/2014  . DKA, type 1 ([place]) 05/20/2014  . Depression 05/20/2014  . AKI (acute kidney injury) ([place]) 05/20/2014  . Dyslipidemia 04/12/2014  .  Leukocytosis 07/28/2013  . Fibromyalgia 05/27/2011  . Essential hypertension 12/15/2006  . INSOMNIA 12/15/2006  . HEADACHE 12/15/2006  . CHEST PAIN 12/15/2006  . COLONIC POLYPS, HX OF 12/15/2006     Sigurd Sos, PT 07/01/2015 1:16 PM  [facility] [address] [place], [place], [address] Phone: [phone]   Fax:  [phone]  Name: Braylin Bergman MRN: [id number] Date of Birth: Oct 12, 1950

## 2015-07-08 ENCOUNTER — Encounter: Payer: Self-pay | Admitting: Cardiovascular Disease

## 2015-07-08 ENCOUNTER — Ambulatory Visit (INDEPENDENT_AMBULATORY_CARE_PROVIDER_SITE_OTHER): Payer: Medicare Other | Admitting: Cardiovascular Disease

## 2015-07-08 VITALS — BP 106/80 | HR 76 | Ht 65.0 in | Wt 212.0 lb

## 2015-07-08 DIAGNOSIS — Z7689 Persons encountering health services in other specified circumstances: Secondary | ICD-10-CM

## 2015-07-08 DIAGNOSIS — R0600 Dyspnea, unspecified: Secondary | ICD-10-CM | POA: Diagnosis not present

## 2015-07-08 DIAGNOSIS — Z7189 Other specified counseling: Secondary | ICD-10-CM | POA: Diagnosis not present

## 2015-07-08 DIAGNOSIS — R9431 Abnormal electrocardiogram [ECG] [EKG]: Secondary | ICD-10-CM | POA: Diagnosis not present

## 2015-07-08 NOTE — Progress Notes (Signed)
Patient ID: Katie Palmer, female   DOB: [birth year], 65 y.o.   MRN: [id number]     Cardiology Office Note   Date:  07/08/2015   ID:  Katie Palmer, DOB Feb 18, 1950, MRN [id number]  PCP:  Laurey Morale, MD  Cardiologist:   Jenkins Rouge, MD   Chief Complaint  Patient presents with  . Establish Care    sent by Dr. [name], poss RBBB, per pt      History of Present Illness: Katie Palmer is a 65 y.o. female who presents for evaluation of abnormal ECG:  Note below RBBB 06/03/15.  Compared to March 2016 ECG showed SR with poor R wave progression but no BBB   History of fibromyalgia and HTN  She is diabetic and use to see Dr Buddy Duty but recently referred to Dr Soyla Murphy Type 2 for 3 years and was started on  Invokana and had pancreatic failure with 3 bouts DKA and is now on insulin pump.  No chronic lung disease. Mild exertional dyspnea. She had lost 90 lbs in past but Put 50 of it back on last year.  No palpitations , syncope or evidence of bradycardia or high grade heart block    Past Medical History  Diagnosis Date  . Hx of colonic polyps   . Depression   . Headache(784.0)   . Hypertension   . Insomnia   . Fibromyalgia   . Diabetes mellitus without complication ([place])   . Vertigo     Past Surgical History  Procedure Laterality Date  . Pulmonary stress test      Normal on 02/04/09. Sees Dr. Philis Pique for GYN exams  . Tonsillectomy    . Colonoscopy  Feb. 2012    per GI in [place], clear, repeat in 5 yrs   . Diagnostic mammogram      01/27/2008. normal     Current Outpatient Prescriptions  Medication Sig Dispense Refill  . ALPRAZolam (XANAX) 1 MG tablet TAKE 1 TABLET BY MOUTH AT BEDTIME AS NEEDED FOR SLEEP (FILL 3/25) 90 tablet 1  . atorvastatin (LIPITOR) 10 MG tablet TAKE 1 TABLET BY MOUTH EVERY DAY 90 tablet 3  . DULoxetine (CYMBALTA) 30 MG capsule Take 1 capsule by mouth daily.  2  . gabapentin (NEURONTIN) 100 MG capsule Take 1 capsule by mouth daily.  0  . GLUCAGON EMERGENCY 1 MG  injection Reported on 06/03/2015  2  . HYDROcodone-acetaminophen (NORCO) 10-325 MG tablet Take 1 tablet by mouth every 8 (eight) hours as needed. 90 tablet 0  . losartan-hydrochlorothiazide (HYZAAR) 50-12.5 MG tablet TAKE 1 TABLET BY MOUTH EVERY DAY (3/9) 90 tablet 2  . meclizine (ANTIVERT) 25 MG tablet TAKE 1 TABLET (25 MG TOTAL) BY MOUTH EVERY 4 (FOUR) HOURS AS NEEDED FOR DIZZINESS. 180 tablet 0  . NOVOLOG 100 UNIT/ML injection UNITS BY PUMP DEPENDS ON SUGAR READINGS, SLIDING SCALE    . SUMAtriptan (IMITREX) 100 MG tablet TAKE 1 TABLET BY MOUTH AS NEEDED FOR MIGRAINE OR HEADACHE MAY REPEAT IN 2 HOURS IF HEADACHE PERSISIT 30 tablet 3  . traMADol (ULTRAM) 50 MG tablet TAKE 2 TABLETS BY MOUTH EVERY 4 HOURS AS NEEDED FOR MODERATE PAIN 360 tablet 5  . traZODone (DESYREL) 100 MG tablet Take 1 tablet by mouth at bedtime.  1   No current facility-administered medications for this visit.    Allergies:   Invokana and Lasix    Social History:  The patient  reports that she has never smoked. She has never used smokeless tobacco. She  reports that she does not drink alcohol or use illicit drugs.   Family History:  The patient's family history includes Alcohol abuse in her father; Aneurysm in her mother; COPD in her sister; Heart disease in her father; Heart failure in her sister; Leukemia in her brother; Rheum arthritis in her sister. There is no history of Colon cancer.    ROS:  Please see the history of present illness.   Otherwise, review of systems are positive for none.   All other systems are reviewed and negative.    PHYSICAL EXAM: VS:  BP 106/80 mmHg  Pulse 76  Ht 5\' 5"  (1.651 m)  Wt 96.163 kg (212 lb)  BMI 35.28 kg/m2  SpO2 94% , BMI Body mass index is 35.28 kg/(m^2). Affect appropriate Healthy:  appears stated age 62: normal Neck supple with no adenopathy JVP normal no bruits no thyromegaly Lungs clear with no wheezing and good diaphragmatic motion Heart:  S1/S2 no murmur, no rub,  gallop or click PMI normal Abdomen: benighn, BS positve, no tenderness, no AAA no bruit.  No HSM or HJR Distal pulses intact with no bruits No edema Neuro non-focal Skin warm and dry No muscular weakness    EKG:  06/03/15  SR ? ILMI  RBBB    Recent Labs: 06/03/2015: ALT 15; BUN 21; Creatinine, Ser 0.92; Hemoglobin 14.2; Platelets 204.0; Potassium 3.7; Sodium 139; TSH 2.47    Lipid Panel    Component Value Date/Time   CHOL 174 06/03/2015 1106   TRIG 63.0 06/03/2015 1106   HDL 84.00 06/03/2015 1106   CHOLHDL 2 06/03/2015 1106   VLDL 12.6 06/03/2015 1106   LDLCALC 77 06/03/2015 1106   LDLDIRECT 145.5 02/04/2009 0946      Wt Readings from Last 3 Encounters:  07/08/15 96.163 kg (212 lb)  06/03/15 95.709 kg (211 lb)  05/29/15 95.255 kg (210 lb)      Other studies Reviewed: Additional studies/ records that were reviewed today include: ECG;s in [facility], notes Dr Sharlene Motts     ASSESSMENT AND PLAN:  1. Abnormal ECG RBBB:  Given IDDM will r/o structural heart disease with exercise myovue and echo  2. Fibromyalgia  Continue tramadol fu primary  3. HTN: Well controlled.  Continue current medications and low sodium Dash type diet.   4. Chol:  On statin with DM 5. Vertigo.  Has had since mid 30's PRN antivert has appt with vestibular PT 6. DM:  Has upcoming appt with Dr Soyla Murphy  A1c recently 7.1 down from 10.  Encouraged her to inquire about Pancreatic transplants      Current medicines are reviewed at length with the patient today.  The patient does not have concerns regarding medicines.  The following changes have been made:  no change  Labs/ tests ordered today include: EX Myovue and Echo   Orders Placed This Encounter  Procedures  . Myocardial Perfusion Imaging  . ECHOCARDIOGRAM COMPLETE     Disposition:   FU with in a year      Signed, Jenkins Rouge, MD  07/08/2015 4:07 PM    [facility] [place], [place], [place]  [address] Phone: [phone]; Fax: [phone]

## 2015-07-08 NOTE — Patient Instructions (Addendum)
Medication Instructions:  Your physician recommends that you continue on your current medications as directed. Please refer to the Current Medication list given to you today.  Labwork: NONE  Testing/Procedures: Your physician has requested that you have an echocardiogram. Echocardiography is a painless test that uses sound waves to create images of your heart. It provides your doctor with information about the size and shape of your heart and how well your heart's chambers and valves are working. This procedure takes approximately one hour. There are no restrictions for this procedure.  Your physician has requested that you have en exercise stress myoview. For further information please visit [email or web address]. Please follow instruction sheet, as given.  Follow-Up: Your physician wants you to follow-up in: 12 months with Dr. Nishan. You will receive a reminder letter in the mail two months in advance. If you don't receive a letter, please call our office to schedule the follow-up appointment.   If you need a refill on your cardiac medications before your next appointment, please call your pharmacy.    

## 2015-07-09 ENCOUNTER — Encounter: Payer: Medicare Other | Admitting: Physical Therapy

## 2015-07-11 ENCOUNTER — Telehealth: Payer: Self-pay | Admitting: *Deleted

## 2015-07-11 NOTE — Telephone Encounter (Signed)
Dr. Silverio Decamp,  This patient was a no show for colonoscopy 06/07/15. Do you want the no show fee charged?  Thanks, Olivia Mackie

## 2015-07-11 NOTE — Telephone Encounter (Signed)
Ok to charge 

## 2015-07-12 ENCOUNTER — Encounter: Payer: Self-pay | Admitting: Family Medicine

## 2015-07-12 DIAGNOSIS — M545 Low back pain: Secondary | ICD-10-CM

## 2015-07-15 NOTE — Telephone Encounter (Signed)
The referral was done  

## 2015-07-16 ENCOUNTER — Ambulatory Visit: Payer: Medicare Other

## 2015-07-16 DIAGNOSIS — M6281 Muscle weakness (generalized): Secondary | ICD-10-CM

## 2015-07-16 DIAGNOSIS — M25611 Stiffness of right shoulder, not elsewhere classified: Secondary | ICD-10-CM

## 2015-07-16 DIAGNOSIS — M25511 Pain in right shoulder: Secondary | ICD-10-CM

## 2015-07-16 DIAGNOSIS — M25512 Pain in left shoulder: Secondary | ICD-10-CM

## 2015-07-16 NOTE — Patient Instructions (Addendum)
Do this on your back and tighten your abdominals while you do this  PNF Strengthening: Resisted   Standing with resistive band around each hand, bring right arm up and away, thumb back. Repeat _10___ times per set. Do _2___ sets per session. Do _1-2___ sessions per day.      Resisted Horizontal Abduction: Bilateral   Sit or stand, tubing in both hands, arms out in front. Keeping arms straight, pinch shoulder blades together and stretch arms out. Repeat _10___ times per set. Do 2____ sets per session. Do _1-2___ sessions per day.   Strengthening: Resisted Extension   Hold tubing in right hand, arm forward. Pull arm back, elbow straight. Repeat _10___ times per set. Do _2___ sets per session. Do _1-2___ sessions per day.  Can place band around the front of your body and perform with both arms at the same time. IONTOPHORESIS PATIENT PRECAUTIONS & CONTRAINDICATIONS:  . Redness under one or both electrodes can occur.  This characterized by a uniform redness that usually disappears within 12 hours of treatment. . Small pinhead size blisters may result in response to the drug.  Contact your physician if the problem persists more than 24 hours. . On rare occasions, iontophoresis therapy can result in temporary skin reactions such as rash, inflammation, irritation or burns.  The skin reactions may be the result of individual sensitivity to the ionic solution used, the condition of the skin at the start of treatment, reaction to the materials in the electrodes, allergies or sensitivity to dexamethasone, or a poor connection between the patch and your skin.  Discontinue using iontophoresis if you have any of these reactions and report to your therapist. . Remove the Patch or electrodes if you have any undue sensation of pain or burning during the treatment and report discomfort to your therapist. . Tell your Therapist if you have had known adverse reactions to the application of electrical  current. . If using the Patch, the LED light will turn off when treatment is complete and the patch can be removed.  Approximate treatment time is 1-3 hours.  Remove the patch when light goes off or after 6 hours. . The Patch can be worn during normal activity, however excessive motion where the electrodes have been placed can cause poor contact between the skin and the electrode or uneven electrical current resulting in greater risk of skin irritation. Marland Kitchen Keep out of the reach of children.   . DO NOT use if you have a cardiac pacemaker or any other electrically sensitive implanted device. . DO NOT use if you have a known sensitivity to dexamethasone. . DO NOT use during Magnetic Resonance Imaging (MRI). . DO NOT use over broken or compromised skin (e.g. sunburn, cuts, or acne) due to the increased risk of skin reaction. . DO NOT SHAVE over the area to be treated:  To establish good contact between the Patch and the skin, excessive hair may be clipped. . DO NOT place the Patch or electrodes on or over your eyes, directly over your heart, or brain. . DO NOT reuse the Patch or electrodes as this may cause burns to occur.  [facility] [address], [place], [name] [address] Phone # [phone] Fax [phone]

## 2015-07-16 NOTE — Therapy (Signed)
[facility] [address], [place], [place], [address] Phone: [phone]   Fax:  [phone]  Physical Therapy Treatment  Patient Details  Name: Katie Palmer MRN: [id number] Date of Birth: 01-02-1951 Referring Provider: Alysia Penna, MD  Encounter Date: 07/16/2015      PT End of Session - 07/16/15 1224    Visit Number 2   Number of Visits 10   Date for PT Re-Evaluation 08/26/15   PT Start Time [id number]   PT Stop Time 1228   PT Time Calculation (min) 40 min   Activity Tolerance Patient tolerated treatment well   Behavior During Therapy [facility] for tasks assessed/performed      Past Medical History  Diagnosis Date  . Hx of colonic polyps   . Depression   . Headache(784.0)   . Hypertension   . Insomnia   . Fibromyalgia   . Diabetes mellitus without complication ([place])   . Vertigo     Past Surgical History  Procedure Laterality Date  . Pulmonary stress test      Normal on 02/04/09. Sees Dr. Philis Pique for GYN exams  . Tonsillectomy    . Colonoscopy  Feb. 2012    per GI in [place], clear, repeat in 5 yrs   . Diagnostic mammogram      01/27/2008. normal    There were no vitals filed for this visit.      Subjective Assessment - 07/16/15 1151    Subjective Pt reports that she has been moderately compliant with HEP.     Currently in Pain? Yes   Pain Score 3    Pain Location Shoulder   Pain Orientation Right   Pain Descriptors / Indicators Shooting;Sore   Pain Type Chronic pain   Pain Onset More than a month ago   Pain Frequency Constant   Aggravating Factors  raising Rt arm overhead, gripping objects with hands (weakness), use of Rt UE   Pain Relieving Factors not raising arm overhead                         [facility] Adult PT Treatment/Exercise - 07/16/15 0001    Exercises   Exercises Shoulder;Lumbar;Hand   Lumbar Exercises: Aerobic   UBE (Upper Arm Bike) seated on green ball: Level 1x  6 minutes (3/3)   Shoulder Exercises: Supine   Horizontal ABduction Both;20 reps;Theraband   Theraband Level (Shoulder Horizontal ABduction) Level 2 (Red)   Other Supine Exercises D2 red band in supine with abdominal bracing. 2x10   Shoulder Exercises: Standing   Flexion AAROM;Right;20 reps  using finger ladder   Extension Strengthening;Both;20 reps;Theraband   Theraband Level (Shoulder Extension) Level 2 (Red)  bilateral   Shoulder Exercises: Pulleys   Flexion 3 minutes   Other Pulley Exercises IR x 2 minutes   Hand Exercises   Digiticizer red x 2 minutes   Modalities   Modalities Iontophoresis   Iontophoresis   Type of Iontophoresis Dexamethasone   Location Rt shoulder at Rt supriapinatus   Dose 1.0 cc  #1   Time 4-6 hours                PT Education - 07/16/15 1211    Education provided Yes   Education Details HEP: scapular theraband unattached with abdominal bracing, ionto   Person(s) Educated Patient   Methods Explanation;Demonstration   Comprehension Verbalized understanding;Returned demonstration          PT Short Term Goals -  07/16/15 1157    PT SHORT TERM GOAL #1   Title be independent in initial HEP   Time 4   Period Weeks   Status On-going   PT SHORT TERM GOAL #2   Title report a 30% reduction in Rt shoulder pain with use overhead   Time 4   Period Weeks   Status On-going   PT SHORT TERM GOAL #3   Title demonstrate Rt shoulder IR to lacking < or = to 5 inches vs the Lt to improve dressing   Time 4   Period Weeks   Status On-going           PT Long Term Goals - 07/01/15 1153    PT LONG TERM GOAL #1   Title be independent in advanced HEP   Time 8   Period Weeks   Status New   PT LONG TERM GOAL #2   Title reduce FOTO to < or = to 37% limitation   Time 8   Period Weeks   Status New   PT LONG TERM GOAL #3   Title report a 60% reduction in Rt shoulder pain with use overhead   Time 8   Period Weeks   Status New   PT LONG TERM  GOAL #4   Title improve Lt grip strength to > or = to 40# to reduce dropping objects   Time 8   Period Weeks   Status New   PT LONG TERM GOAL #5   Title demonstrate Rt shoulder IR to lacking < or = to 3 inches to improve self-care   Time 8   Period Weeks   Status New               Plan - 07/16/15 1158    Clinical Impression Statement Pt with only 1 session after evaluation and reports moderate compliance with HEP.  Pt with improved mobility with Rt shoulder overhead.  Pt denies any change in pain since the start of care.  Pt with Lt>Rt hand weakness.  Pt will continue to benefit from skilled PT for Rt shoulder AROM, bil. hand strength and to imrpove overhead use and to reduce dropping objects.     Rehab Potential Good   PT Frequency 2x / week   PT Duration 8 weeks   PT Treatment/Interventions ADLs/Self Care Home Management;Cryotherapy;Electrical Stimulation;Moist Heat;Iontophoresis 4mg /ml Dexamethasone;Therapeutic exercise;Therapeutic activities;Ultrasound;Neuromuscular re-education;Patient/family education;Manual techniques;Dry needling;Passive range of motion   PT Next Visit Plan Bil grip strength, Rt shoulder ROM and gentle strength, manual and modalities.  PT will evaluate LBP next week.     Consulted and Agree with Plan of Care Patient      Patient will benefit from skilled therapeutic intervention in order to improve the following deficits and impairments:  Pain, Postural dysfunction, Decreased strength, Impaired flexibility, Decreased activity tolerance, Decreased endurance, Decreased range of motion  Visit Diagnosis: Pain in right shoulder  Pain in left shoulder  Stiffness of right shoulder, not elsewhere classified  Muscle weakness (generalized)     Problem List Patient Active Problem List   Diagnosis Date Noted  . Hypertension 06/03/2015  . Diabetes mellitus type 1, uncontrolled ([place]) 06/01/2014  . DKA, type 1 ([place]) 05/20/2014  . Depression 05/20/2014  .  AKI (acute kidney injury) ([place]) 05/20/2014  . Dyslipidemia 04/12/2014  . Leukocytosis 07/28/2013  . Fibromyalgia 05/27/2011  . Essential hypertension 12/15/2006  . INSOMNIA 12/15/2006  . HEADACHE 12/15/2006  . CHEST PAIN 12/15/2006  . COLONIC POLYPS, HX OF  12/15/2006    Sigurd Sos, PT 07/16/2015 12:34 PM  [facility] [address] [place], [place], [address] Phone: [phone]   Fax:  [phone]  Name: Sincere Castleberry MRN: [id number] Date of Birth: 05/24/1950

## 2015-07-18 ENCOUNTER — Encounter: Payer: Medicare Other | Admitting: Physical Therapy

## 2015-07-19 ENCOUNTER — Encounter: Payer: Self-pay | Admitting: Family Medicine

## 2015-07-22 NOTE — Telephone Encounter (Signed)
Call in Xanax #90 with one rf, also Gabapentin #90 with 3 rf

## 2015-07-23 ENCOUNTER — Ambulatory Visit: Payer: Medicare Other | Admitting: Podiatry

## 2015-07-23 ENCOUNTER — Telehealth (HOSPITAL_COMMUNITY): Payer: Self-pay | Admitting: *Deleted

## 2015-07-23 ENCOUNTER — Ambulatory Visit: Payer: Self-pay

## 2015-07-23 MED ORDER — GABAPENTIN 100 MG PO CAPS: 100.0000 mg | ORAL_CAPSULE | Freq: Every day | ORAL | Status: DC

## 2015-07-23 MED ORDER — ALPRAZOLAM 1 MG PO TABS: ORAL_TABLET | ORAL | Status: DC

## 2015-07-23 NOTE — Telephone Encounter (Signed)
Left message on voicemail per DPR in reference to upcoming appointment scheduled on 07/26/15 with detailed instructions given per Myocardial Perfusion Study Information Sheet for the test. LM to arrive 15 minutes early, and that it is imperative to arrive on time for appointment to keep from having the test rescheduled. If you need to cancel or reschedule your appointment, please call the office within 24 hours of your appointment. Failure to do so may result in a cancellation of your appointment, and a $50 no show fee. Phone number given for call back for any questions.  Hubbard Robinson, RN

## 2015-07-25 ENCOUNTER — Ambulatory Visit: Payer: Medicare Other | Attending: Family Medicine

## 2015-07-25 DIAGNOSIS — M25512 Pain in left shoulder: Secondary | ICD-10-CM | POA: Diagnosis present

## 2015-07-25 DIAGNOSIS — M6281 Muscle weakness (generalized): Secondary | ICD-10-CM | POA: Diagnosis present

## 2015-07-25 DIAGNOSIS — M25611 Stiffness of right shoulder, not elsewhere classified: Secondary | ICD-10-CM

## 2015-07-25 DIAGNOSIS — M25511 Pain in right shoulder: Secondary | ICD-10-CM | POA: Insufficient documentation

## 2015-07-25 NOTE — Patient Instructions (Signed)

## 2015-07-25 NOTE — Therapy (Addendum)
[facility] [address] [place], [place], [address] Phone: [phone]   Fax:  [phone]  Physical Therapy Treatment  Patient Details  Name: Katie Palmer MRN: [id number] Date of Birth: 02/11/1950 Referring Provider: Alysia Penna, MD  Encounter Date: 07/25/2015      PT End of Session - 07/25/15 1207    Visit Number 3   Number of Visits 10   Date for PT Re-Evaluation 08/26/15   PT Start Time 6433   PT Stop Time 1207   PT Time Calculation (min) 43 min   Activity Tolerance Patient tolerated treatment well   Behavior During Therapy [facility] for tasks assessed/performed      Past Medical History  Diagnosis Date  . Hx of colonic polyps   . Depression   . Headache(784.0)   . Hypertension   . Insomnia   . Fibromyalgia   . Diabetes mellitus without complication ([place])   . Vertigo     Past Surgical History  Procedure Laterality Date  . Pulmonary stress test      Normal on 02/04/09. Sees Dr. Philis Pique for GYN exams  . Tonsillectomy    . Colonoscopy  Feb. 2012    per GI in [place], clear, repeat in 5 yrs   . Diagnostic mammogram      01/27/2008. normal    There were no vitals filed for this visit.      Subjective Assessment - 07/25/15 1127    Subjective Pt has vertigo and would like to hold off on evaluating low back today due to vertigo.  Lt shoulder hurts more today.     Currently in Pain? No/denies   Pain Orientation Right   Multiple Pain Sites Yes   Pain Score 10   Pain Location Shoulder   Pain Orientation Left   Pain Descriptors / Indicators Sore;Shooting   Pain Type Acute pain   Pain Onset In the past 7 days   Pain Frequency Constant   Aggravating Factors  using Lt UE, raising arm overhead   Pain Relieving Factors nothing, not using it                         [facility] Adult PT Treatment/Exercise - 07/25/15 0001    Lumbar Exercises: Aerobic   UBE (Upper Arm Bike) seated on  green ball: Level 1x 6 minutes (3/3)   Shoulder Exercises: Supine   Horizontal ABduction Both;20 reps;Theraband   Theraband Level (Shoulder Horizontal ABduction) Level 2 (Red)   Other Supine Exercises D2 red band in supine with abdominal bracing. 2x10  Rt only   Shoulder Exercises: Standing   Flexion AAROM;Right;20 reps;Left  using finger ladder   Shoulder Exercises: Pulleys   Flexion 3 minutes   Other Pulley Exercises IR x 2 minutes   Shoulder Exercises: Stretch   Other Shoulder Stretches supine with cane 2x10   Modalities   Modalities Iontophoresis   Iontophoresis   Type of Iontophoresis Dexamethasone   Location Rt shoulder at Rt supriapinatus, Lt shoulder at deltoid   Dose 1.0 cc x 2  #2 Rt, #1 Lt   Time 4-6 hours                PT Education - 07/25/15 1146    Education provided Yes   Education Details ionto instructions, reprinted old exercises as pt lost them   Person(s) Educated Patient   Methods Explanation;Handout   Comprehension Verbalized understanding  PT Short Term Goals - 07/25/15 1129    PT SHORT TERM GOAL #1   Title be independent in initial HEP   Status Achieved   PT SHORT TERM GOAL #2   Title report a 30% reduction in Rt shoulder pain with use overhead   Status Achieved   PT SHORT TERM GOAL #3   Title demonstrate Rt shoulder IR to lacking < or = to 5 inches vs the Lt to improve dressing   Time 4   Period Weeks   Status On-going   PT SHORT TERM GOAL #4   Title demonstrate Lt grip to 30# to reduce dropping objects   Time 4   Period Weeks   Status On-going           PT Long Term Goals - 07/01/15 1153    PT LONG TERM GOAL #1   Title be independent in advanced HEP   Time 8   Period Weeks   Status New   PT LONG TERM GOAL #2   Title reduce FOTO to < or = to 37% limitation   Time 8   Period Weeks   Status New   PT LONG TERM GOAL #3   Title report a 60% reduction in Rt shoulder pain with use overhead   Time 8   Period  Weeks   Status New   PT LONG TERM GOAL #4   Title improve Lt grip strength to > or = to 40# to reduce dropping objects   Time 8   Period Weeks   Status New   PT LONG TERM GOAL #5   Title demonstrate Rt shoulder IR to lacking < or = to 3 inches to improve self-care   Time 8   Period Weeks   Status New               Plan - 07/25/15 1138    Clinical Impression Statement Pt denies any Rt shoulder pain today. Pt with flare-up of Lt shoulder pain over the past few days without known cause.  Pt rates the pain as 10/10 but is able to perform all exercises in clinic today without significant limitation.  PT not able to comprare Rt shoulder IR to Lt due to pain on the Lt.  IR on the Rt is to T10 AROM.  Pt has not been performing HEP as she lost her handout.  Pt with continued Lt>Rt hand weakness.  Pt tolerated gentle exercise well today in the clnic.  Pt will continue to benefit from skilled PT for bil. shoulder AROM, gentle strength and pain modalities as needed.     Rehab Potential Good   PT Frequency 2x / week   PT Duration 8 weeks   PT Treatment/Interventions ADLs/Self Care Home Management;Cryotherapy;Electrical Stimulation;Moist Heat;Iontophoresis 86m/ml Dexamethasone;Therapeutic exercise;Therapeutic activities;Ultrasound;Neuromuscular re-education;Patient/family education;Manual techniques;Dry needling;Passive range of motion   PT Next Visit Plan Bil grip strength, Rt shoulder ROM and gentle strength, manual and modalities.  PT will evaluate LBP next week.     Consulted and Agree with Plan of Care Patient      Patient will benefit from skilled therapeutic intervention in order to improve the following deficits and impairments:  Pain, Postural dysfunction, Decreased strength, Impaired flexibility, Decreased activity tolerance, Decreased endurance, Decreased range of motion  Visit Diagnosis: Pain in right shoulder  Pain in left shoulder  Stiffness of right shoulder, not elsewhere  classified  Muscle weakness (generalized)     Problem List Patient Active Problem List  Diagnosis Date Noted  . Hypertension 06/03/2015  . Diabetes mellitus type 1, uncontrolled ([place]) 06/01/2014  . DKA, type 1 ([place]) 05/20/2014  . Depression 05/20/2014  . AKI (acute kidney injury) ([place]) 05/20/2014  . Dyslipidemia 04/12/2014  . Leukocytosis 07/28/2013  . Fibromyalgia 05/27/2011  . Essential hypertension 12/15/2006  . INSOMNIA 12/15/2006  . HEADACHE 12/15/2006  . CHEST PAIN 12/15/2006  . COLONIC POLYPS, HX OF 12/15/2006    Sigurd Sos, PT 08/20/15 12:09 PM G-codes: Other PT category Goal status: CJ D/C status: CK PHYSICAL THERAPY DISCHARGE SUMMARY  Visits from Start of Care: 3  Current functional level related to goals / functional outcomes: Pt didn't return to PT.  See above for current status.     Remaining deficits: See above- pt didn't return   Education / Equipment: HEP Plan: Patient agrees to discharge.  Patient goals were not met. Patient is being discharged due to not returning since the last visit.  ?????      Sigurd Sos, PT 09/03/15 9:42 AM   [facility] [address] [place], [place], [address] Phone: [phone]   Fax:  [phone]  Name: Katie Palmer MRN: [id number] Date of Birth: [birth year]

## 2015-07-26 ENCOUNTER — Ambulatory Visit (HOSPITAL_COMMUNITY): Payer: Medicare Other | Attending: Cardiology

## 2015-07-26 ENCOUNTER — Ambulatory Visit (HOSPITAL_BASED_OUTPATIENT_CLINIC_OR_DEPARTMENT_OTHER): Payer: Medicare Other

## 2015-07-26 ENCOUNTER — Other Ambulatory Visit: Payer: Self-pay

## 2015-07-26 DIAGNOSIS — Z7689 Persons encountering health services in other specified circumstances: Secondary | ICD-10-CM

## 2015-07-26 DIAGNOSIS — R0609 Other forms of dyspnea: Secondary | ICD-10-CM | POA: Insufficient documentation

## 2015-07-26 DIAGNOSIS — Z7189 Other specified counseling: Secondary | ICD-10-CM

## 2015-07-26 DIAGNOSIS — R9431 Abnormal electrocardiogram [ECG] [EKG]: Secondary | ICD-10-CM | POA: Diagnosis not present

## 2015-07-26 DIAGNOSIS — E109 Type 1 diabetes mellitus without complications: Secondary | ICD-10-CM | POA: Insufficient documentation

## 2015-07-26 DIAGNOSIS — E785 Hyperlipidemia, unspecified: Secondary | ICD-10-CM | POA: Insufficient documentation

## 2015-07-26 DIAGNOSIS — R0600 Dyspnea, unspecified: Secondary | ICD-10-CM

## 2015-07-26 DIAGNOSIS — I119 Hypertensive heart disease without heart failure: Secondary | ICD-10-CM | POA: Insufficient documentation

## 2015-07-26 DIAGNOSIS — I313 Pericardial effusion (noninflammatory): Secondary | ICD-10-CM | POA: Diagnosis not present

## 2015-07-26 HISTORY — PX: TRANSTHORACIC ECHOCARDIOGRAM: SHX275

## 2015-07-26 HISTORY — PX: CARDIOVASCULAR STRESS TEST: SHX262

## 2015-07-26 LAB — MYOCARDIAL PERFUSION IMAGING
CHL CUP MPHR: 154 {beats}/min
CHL CUP NUCLEAR SDS: 0
CHL CUP NUCLEAR SRS: 1
CHL CUP RESTING HR STRESS: 60 {beats}/min
CSEPEDS: 30 s
CSEPHR: 94 %
Estimated workload: 10.1 METS
Exercise duration (min): 8 min
LV sys vol: 23 mL
LVDIAVOL: 78 mL (ref 46–106)
Peak HR: 144 {beats}/min
RATE: 0.33
SSS: 1
TID: 1.04

## 2015-07-26 LAB — ECHOCARDIOGRAM COMPLETE
AVLVOTPG: 6 mmHg
Ao-asc: 31 cm
CHL CUP DOP CALC LVOT VTI: 28.5 cm
E/e' ratio: 6.22
EWDT: 299 ms
FS: 46 % — AB (ref 28–44)
IV/PV OW: 0.99
LA ID, A-P, ES: 36 mm
LADIAMINDEX: 1.68 cm/m2
LAVOL: 29 mL
LAVOLA4C: 37.7 mL
LAVOLIN: 13.5 mL/m2
LEFT ATRIUM END SYS DIAM: 36 mm
LV E/e'average: 6.22
LV PW d: 8.87 mm — AB (ref 0.6–1.1)
LV TDI E'MEDIAL: 8.81
LV e' LATERAL: 10.2 cm/s
LVEEMED: 6.22
LVOT SV: 89 mL
LVOT area: 3.14 cm2
LVOT peak vel: 125 cm/s
LVOTD: 20 mm
Lateral S' vel: 10.2 cm/s
MV Dec: 299
MV pk E vel: 63.4 m/s
MVPKAVEL: 88.3 m/s
TAPSE: 24.9 mm
TDI e' lateral: 10.2

## 2015-07-26 MED ORDER — TECHNETIUM TC 99M TETROFOSMIN IV KIT
32.9000 | PACK | Freq: Once | INTRAVENOUS | Status: AC | PRN
  Administered 2015-07-26: 32.9 via INTRAVENOUS
  Filled 2015-07-26: qty 33

## 2015-07-26 MED ORDER — TECHNETIUM TC 99M TETROFOSMIN IV KIT
10.3000 | PACK | Freq: Once | INTRAVENOUS | Status: AC | PRN
  Administered 2015-07-26: 10 via INTRAVENOUS
  Filled 2015-07-26: qty 10

## 2015-07-29 ENCOUNTER — Ambulatory Visit: Payer: Medicare Other

## 2015-08-01 ENCOUNTER — Ambulatory Visit: Payer: Medicare Other

## 2015-08-03 ENCOUNTER — Other Ambulatory Visit: Payer: Self-pay | Admitting: Family Medicine

## 2015-08-04 ENCOUNTER — Encounter: Payer: Self-pay | Admitting: Family Medicine

## 2015-08-06 ENCOUNTER — Encounter: Payer: Self-pay | Admitting: Family Medicine

## 2015-08-06 ENCOUNTER — Telehealth: Payer: Self-pay | Admitting: Family Medicine

## 2015-08-06 NOTE — Telephone Encounter (Signed)
Rx printed & faxed to local [facility].

## 2015-08-06 NOTE — Telephone Encounter (Signed)
Called to let pt know that Rx was faxed to [facility].

## 2015-08-06 NOTE — Telephone Encounter (Signed)
Ok to refill for 30 days with 0 refills

## 2015-08-08 ENCOUNTER — Ambulatory Visit: Payer: Medicare Other | Admitting: Rehabilitative and Restorative Service Providers"

## 2015-08-23 ENCOUNTER — Telehealth: Payer: Self-pay | Admitting: Family Medicine

## 2015-08-23 MED ORDER — HYDROCODONE-ACETAMINOPHEN 10-325 MG PO TABS: 1.0000 | ORAL_TABLET | Freq: Three times a day (TID) | ORAL | 0 refills | Status: DC | PRN

## 2015-08-23 NOTE — Telephone Encounter (Signed)
Pt request refill  HYDROcodone-acetaminophen (NORCO) 10-325 MG tablet  Pt states her back hurts so bad she cannot move. Hoping to get today. Pt is in a lot of pain.

## 2015-08-23 NOTE — Telephone Encounter (Signed)
Script is ready for pick up here at front office, left a voice message with this information for pt.

## 2015-08-23 NOTE — Telephone Encounter (Signed)
done

## 2015-09-04 ENCOUNTER — Other Ambulatory Visit: Payer: Self-pay | Admitting: Family Medicine

## 2015-09-04 ENCOUNTER — Encounter: Payer: Self-pay | Admitting: Family Medicine

## 2015-09-04 NOTE — Telephone Encounter (Signed)
Call in #[phone] with 5 rf 

## 2015-09-05 NOTE — Telephone Encounter (Signed)
We called in a 6 month supply this am

## 2015-09-05 NOTE — Telephone Encounter (Signed)
Rx called in 

## 2015-09-12 ENCOUNTER — Ambulatory Visit (INDEPENDENT_AMBULATORY_CARE_PROVIDER_SITE_OTHER): Payer: Medicare Other

## 2015-09-12 ENCOUNTER — Ambulatory Visit (INDEPENDENT_AMBULATORY_CARE_PROVIDER_SITE_OTHER): Payer: Medicare Other | Admitting: Podiatry

## 2015-09-12 DIAGNOSIS — B351 Tinea unguium: Secondary | ICD-10-CM

## 2015-09-12 DIAGNOSIS — M8430XA Stress fracture, unspecified site, initial encounter for fracture: Secondary | ICD-10-CM

## 2015-09-12 DIAGNOSIS — M79672 Pain in left foot: Secondary | ICD-10-CM

## 2015-09-12 DIAGNOSIS — M79676 Pain in unspecified toe(s): Secondary | ICD-10-CM | POA: Diagnosis not present

## 2015-09-12 NOTE — Progress Notes (Signed)
Subjective:     Patient ID: Katie Palmer, female   DOB: 1950-06-16, 65 y.o.   MRN: [id number]  HPI patient presents stating she woke up yesterday and she had severe pain in her left heel and she's not been able to bear weight on   Review of Systems     Objective:   Physical Exam Neurovascular status intact muscle strength adequate with severe discomfort in the calcaneus mostly from the lateral side left with minimal discomfort in the plantar heel    Assessment:     Strong probability for stress fracture of the left heel bone    Plan:     H&P x-ray reviewed and educated the patient on this condition. I do think that immobilization as her best option and I placed in a short air fracture walker to reduce the pressure against the heel. Patient will be seen back for [place] to recheck in 3 weeks and I'm anticipating a 6-12 week recovery process  X-ray was inconclusive at this time for stress fracture but clinically appears to be

## 2015-10-03 ENCOUNTER — Ambulatory Visit: Payer: Medicare Other | Admitting: Podiatry

## 2015-10-03 ENCOUNTER — Encounter: Payer: Self-pay | Admitting: Family Medicine

## 2015-10-04 ENCOUNTER — Other Ambulatory Visit: Payer: Self-pay | Admitting: Family Medicine

## 2015-10-04 MED ORDER — HYDROCODONE-ACETAMINOPHEN 10-325 MG PO TABS: 1.0000 | ORAL_TABLET | Freq: Three times a day (TID) | ORAL | 0 refills | Status: DC | PRN

## 2015-10-04 NOTE — Telephone Encounter (Signed)
Please ask [employer] to follow my original instructions

## 2015-10-04 NOTE — Telephone Encounter (Signed)
Done

## 2015-10-11 ENCOUNTER — Telehealth: Payer: Self-pay

## 2015-10-11 NOTE — Telephone Encounter (Signed)
Received PA request from [facility] for Tramadol to override the max daily dose. PA submitted & is pending. [id number]

## 2015-10-11 NOTE — Telephone Encounter (Signed)
PA approved through 09.15.18. Pharmacy aware.

## 2015-10-31 ENCOUNTER — Encounter: Payer: Self-pay | Admitting: Family Medicine

## 2015-11-05 NOTE — Telephone Encounter (Signed)
I did the referral to Dr. Watt Climes. Please tell her I am sorry but I cannot see her grandson, we are just too busy now

## 2015-11-05 NOTE — Addendum Note (Signed)
Addended by: Alysia Penna A on: 11/05/2015 07:28 AM   Modules accepted: Orders

## 2015-11-07 ENCOUNTER — Encounter: Payer: Medicare Other | Admitting: Gastroenterology

## 2015-11-13 ENCOUNTER — Encounter: Payer: Medicare Other | Attending: Endocrinology | Admitting: *Deleted

## 2015-11-13 DIAGNOSIS — E785 Hyperlipidemia, unspecified: Secondary | ICD-10-CM | POA: Diagnosis not present

## 2015-11-13 DIAGNOSIS — Z713 Dietary counseling and surveillance: Secondary | ICD-10-CM | POA: Insufficient documentation

## 2015-11-13 DIAGNOSIS — E119 Type 2 diabetes mellitus without complications: Secondary | ICD-10-CM | POA: Insufficient documentation

## 2015-11-13 DIAGNOSIS — Z794 Long term (current) use of insulin: Secondary | ICD-10-CM | POA: Diagnosis not present

## 2015-11-13 DIAGNOSIS — K219 Gastro-esophageal reflux disease without esophagitis: Secondary | ICD-10-CM | POA: Insufficient documentation

## 2015-11-13 DIAGNOSIS — IMO0001 Reserved for inherently not codable concepts without codable children: Secondary | ICD-10-CM

## 2015-11-13 DIAGNOSIS — E1065 Type 1 diabetes mellitus with hyperglycemia: Secondary | ICD-10-CM

## 2015-11-13 DIAGNOSIS — I10 Essential (primary) hypertension: Secondary | ICD-10-CM | POA: Insufficient documentation

## 2015-11-13 NOTE — Patient Instructions (Signed)
Plan: We have increased your Sensitivity Factor from 55 to 48 so you will get more insulin for correcting any high BG's We will look at your reports in 1 to 2 weeks to re-assess Consider looking into programs offered at your [facility] for some group options to help start increasing your activity level

## 2015-11-13 NOTE — Progress Notes (Signed)
  Pump Follow Up Progress Note  Reviewed blood glucose logs on 11/13/2015 via: [employer] reports and found the following:            Comments: Patient very frustrated with high blood sugars. She has only had 2 low BG's in the past week. It appears that her correction doses are not bringing her down to her target range. I re-evaluated her Sensitivity Factor based on her average TDD of 40 units per day and it indicated 1/42 mg/dl. Her current Sensitivity Factor is 1/55 which is much softer. We agreed to split the difference and she chose to increase her setting to 1/48 mg/dl and try that for 1-2 weeks. We plan to meet again in 2 weeks to look at her reports again and discuss any further setting changes.   Pump Settings: Date: Current Date: 11/13/2015   Changes in bold print   Basal Rate: Carb Ratio Sensitivity  Basal Rate: Carb Ratio Sensitivity   MN: 0.725 MN: 6 55 MN: 0.725 MN: 6 48  (+)  8 AM 0.775 8 AM: 6  8 AM 0.775 8 AM:  6   4 PM 0.725 4 PM: 7  4 P< 0.725 4 PM: 7                                        Plan: We have increased your Sensitivity Factor from 55 to 48 so you will get more insulin for correcting any high BG's We will look at your reports in 1 to 2 weeks to re-assess Consider looking into programs offered at your [facility] for some group options to help start increasing your activity level    Follow up: See me again in 2 weeks.

## 2015-11-14 ENCOUNTER — Telehealth: Payer: Self-pay | Admitting: *Deleted

## 2015-11-14 NOTE — Telephone Encounter (Signed)
Patient texted me a picture of her Dexcom CGM screen that her BG are well below 200 mg/dl today. We increased her Carb Ratio yesterday and she wanted me to know how much better she was doing. However her BG throughout today have been very close to 100 mg/dl some lower and she is at risk of hypoglycemia. I suggested we decrease her basal rates slightly today. See blelow:  Pump Settings: Date: Current Date: 12/15/2015  Changes in bold print   Basal Rate: Carb Ratio Sensitivity  Basal Rate: Carb Ratio Sensitivity   MN: 0.725   MN: 0.675  (-)    8 A:  0.775    0.725  (-)    4 P:  0.725    0.675  (-)                                         Plan: Patient has follow up appt with me in 2 weeks.

## 2015-11-19 ENCOUNTER — Encounter: Payer: Medicare Other | Admitting: Gastroenterology

## 2015-11-24 ENCOUNTER — Other Ambulatory Visit: Payer: Self-pay | Admitting: Family Medicine

## 2015-11-27 ENCOUNTER — Ambulatory Visit: Payer: Medicare Other | Admitting: *Deleted

## 2015-11-29 ENCOUNTER — Encounter: Payer: Self-pay | Admitting: Family Medicine

## 2015-12-03 MED ORDER — HYDROCODONE-ACETAMINOPHEN 10-325 MG PO TABS: 1.0000 | ORAL_TABLET | Freq: Three times a day (TID) | ORAL | 0 refills | Status: DC | PRN

## 2015-12-03 NOTE — Telephone Encounter (Signed)
done

## 2015-12-05 ENCOUNTER — Ambulatory Visit: Payer: Medicare Other | Admitting: *Deleted

## 2015-12-16 ENCOUNTER — Encounter: Payer: Self-pay | Admitting: Family Medicine

## 2015-12-16 NOTE — Telephone Encounter (Signed)
I can do early refills on the Xanax but NOT on the Tramadol or the Hydrocodone. These are strictly regulated. She will need to wait until 01-02-16 to get more of these. Call in Xanax #90 with no rf.

## 2015-12-17 ENCOUNTER — Encounter: Payer: Self-pay | Admitting: Family Medicine

## 2015-12-17 ENCOUNTER — Other Ambulatory Visit: Payer: Self-pay | Admitting: Family Medicine

## 2015-12-17 MED ORDER — ALPRAZOLAM 1 MG PO TABS: ORAL_TABLET | ORAL | 0 refills | Status: DC

## 2015-12-18 ENCOUNTER — Encounter (HOSPITAL_COMMUNITY): Payer: Self-pay | Admitting: *Deleted

## 2015-12-23 ENCOUNTER — Other Ambulatory Visit: Payer: Self-pay | Admitting: Obstetrics and Gynecology

## 2015-12-23 ENCOUNTER — Encounter (HOSPITAL_COMMUNITY): Payer: Self-pay | Admitting: *Deleted

## 2015-12-23 ENCOUNTER — Encounter: Payer: Self-pay | Admitting: Family Medicine

## 2015-12-23 NOTE — Progress Notes (Signed)
NPO AFTER MN.  ARRIVE AT 0700.  NEEDS ISTAT 8.  CURRENT EKG IN CHART AND EPIC.  PT HAS INSULIN PUMP,  BASAL RATE INFO IN CHART.

## 2015-12-24 NOTE — H&P (Signed)
65 y.o. yo complains of RLQ pain and thickened endometrium.  Pt also would like to have a bilateral salpingectomies to help prevent ca if possible.  Previously:"F/U US today for thickened endo.; Pt. c/o same intermittent dull, achy, RLQ pain, denies any bleeding/ tb//\Previously:"Intermittant RLQ pain. [place] only abnormal for thickened endo. Will repeat US in 3 months since no bleeding now and want to track pain. If pain worse and EM thick, consider laparoscopy? If pain ok, and thickened endo, may do EMB vs D&C, hysterscopy. See sooner if any bleeding or worsening of pain"\ F/u :Today-Pelvic TV- 6x4x3; EM 7.4 mm. LO normal. RO normal with calcification. EM slightly less- was 7.9 mm. No blood flow seen to EM. \Still low abd pain which is sporadic. No bleeding at all."  Past Medical History:  Diagnosis Date  . Depression   . Fibromyalgia   . Headache(784.0)   . History of colon polyps   . History of diabetes with ketoacidosis   . Hx of colonic polyps   . Hypertension   . Insomnia   . Insulin pump in place   . RBBB (right bundle branch block)   . RLQ abdominal pain   . Thickened endometrium   . Type 1 diabetes mellitus with long-term current use of insulin [facility]) endocrinologist-- dr Chalmers Cater   July 2015--  pt took Invokana and caused complete pancreas shut-down , pt went from pre-diabetic to adult type 1 dm now insulin dependent  . Vertigo    Past Surgical History:  Procedure Laterality Date  . CARDIOVASCULAR STRESS TEST  07/26/2015   normal myocardial perfusion study w/ no ischemia/  normal LV function and wall motion , ef 71%  . COLONOSCOPY WITH PROPOFOL  last one 10/ 2017  . TONSILLECTOMY  age 65  . TRANSTHORACIC ECHOCARDIOGRAM  07/26/2015   grade 1 diastolic dysfunction , ef 60-65%/  trivial TR    Social History   Social History  . Marital status: Divorced    Spouse name: N/A  . Number of children: 1  . Years of education: N/A   Occupational History  . Life insurance agent     Social History Main Topics  . Smoking status: Never Smoker  . Smokeless tobacco: Never Used  . Alcohol use No  . Drug use: No  . Sexual activity: No   Other Topics Concern  . Not on file   Social History Narrative   Divorced.  Son and grandson live with her.  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    No current facility-administered medications on file prior to encounter.    Current Outpatient Prescriptions on File Prior to Encounter  Medication Sig Dispense Refill  . atorvastatin (LIPITOR) 10 MG tablet TAKE 1 TABLET BY MOUTH EVERY DAY (Patient taking differently: TAKE 1 TABLET BY MOUTH EVERY DAY--  TAKES IN PM  (PROTECT KIDNEYS)) 90 tablet 3  . DULoxetine (CYMBALTA) 30 MG capsule Take 1 capsule by mouth every evening.   2  . gabapentin (NEURONTIN) 100 MG capsule Take 1 capsule (100 mg total) by mouth daily. (Patient taking differently: Take 100-200 mg by mouth daily. ) 90 capsule 3  . GLUCAGON EMERGENCY 1 MG injection Reported on 06/03/2015  2  . losartan-hydrochlorothiazide (HYZAAR) 50-12.5 MG tablet TAKE 1 TABLET BY MOUTH EVERY DAY (3/9) (Patient taking differently: TAKE 1 TABLET BY MOUTH EVERY DAY (3/9)  (TAKES FOR DM TYPE 1)) 90 tablet 2  . meclizine (ANTIVERT) 25 MG tablet TAKE 1  TABLET (25 MG TOTAL) BY MOUTH EVERY 4 (FOUR) HOURS AS NEEDED FOR DIZZINESS. 180 tablet 0  . NOVOLOG 100 UNIT/ML injection UNITS BY PUMP DEPENDS ON SUGAR READINGS, SLIDING SCALE    . SUMAtriptan (IMITREX) 100 MG tablet TAKE 1 TABLET BY MOUTH AS NEEDED FOR MIGRAINE OR HEADACHE MAY REPEAT IN 2 HOURS IF HEADACHE PERSISIT 30 tablet 3  . traZODone (DESYREL) 100 MG tablet Take 1 tablet by mouth at bedtime.  1    Allergies  Allergen Reactions  . Invokana [Canagliflozin] Other (See Comments)    "shut down pancreas"   . Lasix [Furosemide] Diarrhea    Severe diarrhea    @VITALS2 @  Lungs: clear to ascultation Cor:  RRR Abdomen:  soft, nontender, nondistended. Ex:  no cords,  erythema Pelvic:     Vulva: no masses, no atrophy, no lesions\ls1   Vagina: no tenderness, no erythema, no abnormal vaginal discharge, no vesicle(s) or ulcers, no cystocele, no rectocele\ls1   Cervix: grossly normal, no discharge, no cervical motion tenderness, sample taken for a Pap smear\ls1   Uterus: normal size (7), normal shape, midline, no uterine prolapse, non-tender\ls1   Bladder/Urethra: normal meatus, no urethral discharge, no urethral mass, bladder non distended, Urethra well supported\ls1  Adnexa/Parametria: no parametrial tenderness, no parametrial mass, no adnexal tenderness, no ovarian mass  A:  RLQ pain with thickened endometrium.  For dx laparoscopy, possible RSO, salpingectomies, possible LOA or cautery of endometriosis, d&c, hysteroscopy.   P: All risks, benefits and alternatives d/w patient and she desires to proceed.  Patient has undergone a modified bowel prep and will receive  SCDs during the operation.     Kialee Kham A

## 2015-12-25 ENCOUNTER — Encounter (HOSPITAL_COMMUNITY): Payer: Self-pay | Admitting: *Deleted

## 2015-12-25 ENCOUNTER — Encounter (HOSPITAL_BASED_OUTPATIENT_CLINIC_OR_DEPARTMENT_OTHER)
Admission: RE | Disposition: A | Discharge status: Home / Self Care | Payer: Self-pay | Source: Ambulatory Visit | Attending: Obstetrics and Gynecology

## 2015-12-25 ENCOUNTER — Ambulatory Visit (HOSPITAL_BASED_OUTPATIENT_CLINIC_OR_DEPARTMENT_OTHER)
Admission: RE | Admit: 2015-12-25 | Discharge: 2015-12-25 | Disposition: A | Discharge status: Home / Self Care | Payer: Medicare Other | Source: Ambulatory Visit | Attending: Obstetrics and Gynecology | Admitting: Obstetrics and Gynecology

## 2015-12-25 ENCOUNTER — Ambulatory Visit (HOSPITAL_BASED_OUTPATIENT_CLINIC_OR_DEPARTMENT_OTHER): Payer: Medicare Other | Admitting: Anesthesiology

## 2015-12-25 ENCOUNTER — Other Ambulatory Visit (HOSPITAL_COMMUNITY): Admit: 2015-12-25 | Payer: Self-pay

## 2015-12-25 DIAGNOSIS — Z79899 Other long term (current) drug therapy: Secondary | ICD-10-CM | POA: Insufficient documentation

## 2015-12-25 DIAGNOSIS — N840 Polyp of corpus uteri: Secondary | ICD-10-CM | POA: Insufficient documentation

## 2015-12-25 DIAGNOSIS — I10 Essential (primary) hypertension: Secondary | ICD-10-CM | POA: Insufficient documentation

## 2015-12-25 DIAGNOSIS — M797 Fibromyalgia: Secondary | ICD-10-CM | POA: Insufficient documentation

## 2015-12-25 DIAGNOSIS — F329 Major depressive disorder, single episode, unspecified: Secondary | ICD-10-CM | POA: Diagnosis not present

## 2015-12-25 DIAGNOSIS — R51 Headache: Secondary | ICD-10-CM | POA: Insufficient documentation

## 2015-12-25 DIAGNOSIS — R1031 Right lower quadrant pain: Secondary | ICD-10-CM | POA: Diagnosis present

## 2015-12-25 DIAGNOSIS — Z9641 Presence of insulin pump (external) (internal): Secondary | ICD-10-CM | POA: Diagnosis not present

## 2015-12-25 DIAGNOSIS — E109 Type 1 diabetes mellitus without complications: Secondary | ICD-10-CM | POA: Diagnosis not present

## 2015-12-25 DIAGNOSIS — N736 Female pelvic peritoneal adhesions (postinfective): Secondary | ICD-10-CM | POA: Insufficient documentation

## 2015-12-25 DIAGNOSIS — D270 Benign neoplasm of right ovary: Secondary | ICD-10-CM | POA: Insufficient documentation

## 2015-12-25 DIAGNOSIS — Z794 Long term (current) use of insulin: Secondary | ICD-10-CM | POA: Insufficient documentation

## 2015-12-25 DIAGNOSIS — R42 Dizziness and giddiness: Secondary | ICD-10-CM | POA: Diagnosis not present

## 2015-12-25 HISTORY — DX: Presence of insulin pump (external) (internal): Z96.41

## 2015-12-25 HISTORY — DX: Type 1 diabetes mellitus without complications: E10.9

## 2015-12-25 HISTORY — PX: LAPAROSCOPY: SHX197

## 2015-12-25 HISTORY — PX: LAPAROSCOPIC UNILATERAL SALPINGO OOPHERECTOMY: SHX5935

## 2015-12-25 HISTORY — DX: Personal history of colonic polyps: Z86.010

## 2015-12-25 HISTORY — DX: Abnormal findings on diagnostic imaging of other specified body structures: R93.89

## 2015-12-25 HISTORY — PX: HYSTEROSCOPY WITH D & C: SHX1775

## 2015-12-25 HISTORY — DX: Unspecified right bundle-branch block: I45.10

## 2015-12-25 HISTORY — DX: Personal history of other endocrine, nutritional and metabolic disease: Z86.39

## 2015-12-25 HISTORY — DX: Right lower quadrant pain: R10.31

## 2015-12-25 HISTORY — PX: LAPAROSCOPIC LYSIS OF ADHESIONS: SHX5905

## 2015-12-25 HISTORY — DX: Personal history of colon polyps, unspecified: Z86.0100

## 2015-12-25 SURGERY — DILATATION AND CURETTAGE /HYSTEROSCOPY
Anesthesia: General | Laterality: Right

## 2015-12-25 MED ORDER — LIDOCAINE HCL (CARDIAC) 20 MG/ML IV SOLN
INTRAVENOUS | Status: DC | PRN
  Administered 2015-12-25: 30 mg via INTRAVENOUS

## 2015-12-25 MED ORDER — SUGAMMADEX SODIUM 200 MG/2ML IV SOLN
INTRAVENOUS | Status: DC | PRN
  Administered 2015-12-25: 200 mg via INTRAVENOUS

## 2015-12-25 MED ORDER — PROMETHAZINE HCL 25 MG/ML IJ SOLN: 6.2500 mg | INTRAMUSCULAR | Status: DC | PRN

## 2015-12-25 MED ORDER — SODIUM CHLORIDE 0.9 % IR SOLN
Status: DC | PRN
  Administered 2015-12-25: 3000 mL

## 2015-12-25 MED ORDER — LACTATED RINGERS IV SOLN: INTRAVENOUS | Status: DC

## 2015-12-25 MED ORDER — FENTANYL CITRATE (PF) 100 MCG/2ML IJ SOLN
INTRAMUSCULAR | Status: DC | PRN
  Administered 2015-12-25: 50 ug via INTRAVENOUS
  Administered 2015-12-25 (×3): 100 ug via INTRAVENOUS
  Administered 2015-12-25: 50 ug via INTRAVENOUS

## 2015-12-25 MED ORDER — MIDAZOLAM HCL 5 MG/5ML IJ SOLN
INTRAMUSCULAR | Status: DC | PRN
  Administered 2015-12-25: 2 mg via INTRAVENOUS

## 2015-12-25 MED ORDER — ONDANSETRON HCL 4 MG/2ML IJ SOLN
INTRAMUSCULAR | Status: DC | PRN
  Administered 2015-12-25: 4 mg via INTRAVENOUS

## 2015-12-25 MED ORDER — OXYCODONE-ACETAMINOPHEN 5-325 MG PO TABS: 1.0000 | ORAL_TABLET | ORAL | 0 refills | Status: DC | PRN

## 2015-12-25 MED ORDER — PROPOFOL 10 MG/ML IV BOLUS
INTRAVENOUS | Status: DC | PRN
  Administered 2015-12-25: 200 mg via INTRAVENOUS

## 2015-12-25 MED ORDER — HYDROMORPHONE HCL 1 MG/ML IJ SOLN
0.2500 mg | INTRAMUSCULAR | Status: DC | PRN
  Administered 2015-12-25 (×2): 0.5 mg via INTRAVENOUS

## 2015-12-25 MED ORDER — LACTATED RINGERS IV SOLN
INTRAVENOUS | Status: DC
  Administered 2015-12-25 (×3): via INTRAVENOUS

## 2015-12-25 MED ORDER — ROCURONIUM BROMIDE 100 MG/10ML IV SOLN
INTRAVENOUS | Status: DC | PRN
  Administered 2015-12-25: 10 mg via INTRAVENOUS
  Administered 2015-12-25: 50 mg via INTRAVENOUS

## 2015-12-25 MED ORDER — MEPERIDINE HCL 25 MG/ML IJ SOLN: 6.2500 mg | INTRAMUSCULAR | Status: DC | PRN

## 2015-12-25 MED ORDER — DEXAMETHASONE SODIUM PHOSPHATE 4 MG/ML IJ SOLN
INTRAMUSCULAR | Status: DC | PRN
  Administered 2015-12-25: 4 mg via INTRAVENOUS

## 2015-12-25 SURGICAL SUPPLY — 73 items
ABLATOR ENDOMETRIAL BIPOLAR (ABLATOR) IMPLANT
APL SRG 38 LTWT LNG FL B (MISCELLANEOUS) ×2
APPLICATOR ARISTA FLEXITIP XL (MISCELLANEOUS) ×2 IMPLANT
BAG SPEC RTRVL LRG 6X4 10 (ENDOMECHANICALS) ×2
BIPOLAR CUTTING LOOP 21FR (ELECTRODE)
BLADE SURG 11 STRL SS (BLADE) ×4 IMPLANT
CABLE HIGH FREQUENCY MONO STRZ (ELECTRODE) IMPLANT
CANISTER SUCT 3000ML (MISCELLANEOUS) ×4 IMPLANT
CANISTER SUCTION 2500CC (MISCELLANEOUS) ×4 IMPLANT
CATH ROBINSON RED A/P 16FR (CATHETERS) ×2 IMPLANT
CONTAINER PREFILL 10% NBF 60ML (FORM) ×8 IMPLANT
COVER BACK TABLE 60X90IN (DRAPES) ×4 IMPLANT
COVER MAYO STAND STRL (DRAPES) ×4 IMPLANT
DILATOR CANAL MILEX (MISCELLANEOUS) IMPLANT
DRAPE HYSTEROSCOPY (DRAPE) ×4 IMPLANT
DRAPE LG THREE QUARTER DISP (DRAPES) ×4 IMPLANT
DRAPE UNDERBUTTOCKS STRL (DRAPE) ×4 IMPLANT
DRSG OPSITE POSTOP 3X4 (GAUZE/BANDAGES/DRESSINGS) IMPLANT
DRSG TELFA 3X8 NADH (GAUZE/BANDAGES/DRESSINGS) ×4 IMPLANT
DURAPREP 26ML APPLICATOR (WOUND CARE) ×4 IMPLANT
ELECT REM PT RETURN 9FT ADLT (ELECTROSURGICAL) ×4
ELECTRODE REM PT RTRN 9FT ADLT (ELECTROSURGICAL) ×2 IMPLANT
GLOVE BIO SURGEON STRL SZ7 (GLOVE) ×4 IMPLANT
GLOVE BIOGEL PI IND STRL 7.0 (GLOVE) ×2 IMPLANT
GLOVE BIOGEL PI INDICATOR 7.0 (GLOVE) ×2
GOWN STRL REUS W/TWL LRG LVL3 (GOWN DISPOSABLE) ×8 IMPLANT
HEMOSTAT ARISTA ABSORB 3G PWDR (MISCELLANEOUS) ×2 IMPLANT
KIT ROOM TURNOVER WOR (KITS) ×4 IMPLANT
LEGGING LITHOTOMY PAIR STRL (DRAPES) ×4 IMPLANT
LIGASURE VESSEL 5MM BLUNT TIP (ELECTROSURGICAL) IMPLANT
LIQUID BAND (GAUZE/BANDAGES/DRESSINGS) ×4 IMPLANT
LOOP CUTTING BIPOLAR 21FR (ELECTRODE) IMPLANT
NDL INSUFFLATION 14GA 120MM (NEEDLE) IMPLANT
NDL INSUFFLATION 14GA 150MM (NEEDLE) IMPLANT
NEEDLE HYPO 22GX1.5 SAFETY (NEEDLE) ×2 IMPLANT
NEEDLE INSUFFLATION 120MM (ENDOMECHANICALS) ×4 IMPLANT
NEEDLE INSUFFLATION 14GA 120MM (NEEDLE) IMPLANT
NEEDLE INSUFFLATION 14GA 150MM (NEEDLE) IMPLANT
NS IRRIG 500ML POUR BTL (IV SOLUTION) ×4 IMPLANT
PACK BASIN DAY SURGERY FS (CUSTOM PROCEDURE TRAY) ×4 IMPLANT
PACK LAPAROSCOPY BASIN (CUSTOM PROCEDURE TRAY) ×4 IMPLANT
PACK TRENDGUARD 450 HYBRID PRO (MISCELLANEOUS) IMPLANT
PACK TRENDGUARD 600 HYBRD PROC (MISCELLANEOUS) IMPLANT
PAD DRESSING TELFA 3X8 NADH (GAUZE/BANDAGES/DRESSINGS) ×2 IMPLANT
PAD OB MATERNITY 4.3X12.25 (PERSONAL CARE ITEMS) ×4 IMPLANT
PAD PREP 24X48 CUFFED NSTRL (MISCELLANEOUS) ×4 IMPLANT
POUCH SPECIMEN RETRIEVAL 10MM (ENDOMECHANICALS) ×2 IMPLANT
SEALER TISSUE G2 CVD JAW 35 (ENDOMECHANICALS) IMPLANT
SEALER TISSUE G2 CVD JAW 45CM (ENDOMECHANICALS) ×2
SET IRRIG TUBING LAPAROSCOPIC (IRRIGATION / IRRIGATOR) IMPLANT
SOLUTION ANTI FOG 6CC (MISCELLANEOUS) ×4 IMPLANT
SOLUTION ELECTROLUBE (MISCELLANEOUS) IMPLANT
SPONGE LAP 4X18 X RAY DECT (DISPOSABLE) ×2 IMPLANT
SUT VIC AB 2-0 UR5 27 (SUTURE) ×8 IMPLANT
SUT VICRYL 0 UR6 27IN ABS (SUTURE) ×2 IMPLANT
SUT VICRYL RAPIDE 3 0 (SUTURE) ×6 IMPLANT
SYR 5ML LL (SYRINGE) ×4 IMPLANT
SYR CONTROL 10ML LL (SYRINGE) ×2 IMPLANT
SYRINGE 10CC LL (SYRINGE) ×4 IMPLANT
TOWEL OR 17X24 6PK STRL BLUE (TOWEL DISPOSABLE) ×12 IMPLANT
TRAY DSU PREP LF (CUSTOM PROCEDURE TRAY) ×4 IMPLANT
TRAY FOLEY CATH SILVER 16FR (SET/KITS/TRAYS/PACK) ×2 IMPLANT
TRENDGUARD 450 HYBRID PRO PACK (MISCELLANEOUS)
TRENDGUARD 600 HYBRID PROC PK (MISCELLANEOUS)
TROCAR OPTI TIP 5M 100M (ENDOMECHANICALS) ×4 IMPLANT
TROCAR XCEL DIL TIP R 11M (ENDOMECHANICALS) ×4 IMPLANT
TROCAR XCEL NON-BLD 11X100MML (ENDOMECHANICALS) ×2 IMPLANT
TROCAR XCEL NON-BLD 5MMX100MML (ENDOMECHANICALS) IMPLANT
TUBING AQUILEX INFLOW (TUBING) ×4 IMPLANT
TUBING AQUILEX OUTFLOW (TUBING) ×4 IMPLANT
TUBING INSUFFLATION 10FT LAP (TUBING) ×4 IMPLANT
WARMER LAPAROSCOPE (MISCELLANEOUS) ×4 IMPLANT
WATER STERILE IRR 500ML POUR (IV SOLUTION) ×4 IMPLANT

## 2015-12-25 NOTE — Discharge Instructions (Signed)
Bruising of the abdomen and around incisions is common and will go away.  This is from the placement of the instruments.  Pt may have some significant shoulder pain tonight and the percocet should help; this should be better by tomorrow.  She also may have some crepitus of her skin around the abdomen and legs- this is a crackling feeling and sound from some air trapped underneath the skin and will also go away in next day or so.   Post Anesthesia Home Care Instructions  Activity: Get plenty of rest for the remainder of the day. A responsible adult should stay with you for 24 hours following the procedure.  For the next 24 hours, DO NOT: -Drive a car -Paediatric nurse -Drink alcoholic beverages -Take any medication unless instructed by your physician -Make any legal decisions or sign important papers.  Meals: Start with liquid foods such as gelatin or soup. Progress to regular foods as tolerated. Avoid greasy, spicy, heavy foods. If nausea and/or vomiting occur, drink only clear liquids until the nausea and/or vomiting subsides. Call your physician if vomiting continues.  Special Instructions/Symptoms: Your throat may feel dry or sore from the anesthesia or the breathing tube placed in your throat during surgery. If this causes discomfort, gargle with warm salt water. The discomfort should disappear within 24 hours.  If you had a scopolamine patch placed behind your ear for the management of post- operative nausea and/or vomiting:  1. The medication in the patch is effective for 72 hours, after which it should be removed.  Wrap patch in a tissue and discard in the trash. Wash hands thoroughly with soap and water. 2. You may remove the patch earlier than 72 hours if you experience unpleasant side effects which may include dry mouth, dizziness or visual disturbances. 3. Avoid touching the patch. Wash your hands with soap and water after contact with the patch.   HOME CARE INSTRUCTIONS -  LAPAROSCOPY  Wound Care: The bandaids or dressing which are placed over the skin openings may be removed the day after surgery. The incision should be kept clean and dry. The stitches do not need to be removed. Should the incision become sore, red, and swollen after the first week, check with your doctor.  Personal Hygiene: Shower the day after your procedure. Always wipe from front to back after elimination.   Activity: Do not drive or operate any equipment today. The effects of the anesthesia are still present and drowsiness may result. Rest today, not necessarily flat bed rest, just take it easy. You may resume your normal activity in one to three days or as instructed by your physician.  Sexual Activity: You resume sexual activity as indicated by your physician_________. If your laparoscopy was for a sterilization ( tubes tied ), continue current method of birth control until after your next period or ask for specific instructions from your doctor.  Diet: Eat a light diet as desired this evening. You may resume a regular diet tomorrow.  Return to Work: Two to three days or as indicated by your doctor.  Expectations After Surgery: Your surgery will cause vaginal drainage or spotting which may continue for 2-3 days. Mild abdominal discomfort or tenderness is not unusual and some shoulder pain may also be noted which can be relieved by lying flat in pain.  Call Your Doctor If these Occur:  Persistent or heavy bleeding at incision site       Redness or swelling around incision  Elevation of temperature greater than 100 degrees F  Call for follow-up appointment _____________.

## 2015-12-25 NOTE — Op Note (Signed)
12/25/2015  10:36 AM  PATIENT:  Katie Palmer  65 y.o. female  PRE-OPERATIVE DIAGNOSIS:  RLQ PAIN, thickened endometrium  POST-OPERATIVE DIAGNOSIS:  R O lesion, adhesions, polyp in uterus  PROCEDURE:  Procedure(s): DILATATION AND CURETTAGE /HYSTEROSCOPY (N/A) LAPAROSCOPY OPERATIVE (N/A) LAPAROSCOPIC LYSIS OF ADHESIONS (N/A) LAPAROSCOPIC UNILATERAL SALPINGO OOPHORECTOMY (Right)  SURGEON:  Surgeon(s) and Role:    * Bobbye Charleston, MD - Primary    * Sanjuana Kava, MD - Assisting   ANESTHESIA:   general  EBL:  Total I/O In: [id number] [I.V.:1650] Out: 100 [Urine:50; Blood:50]  LOCAL MEDICATIONS USED:  OTHER arista  SPECIMEN:  Source of Specimen:  R tube and ovary, uterine currettings  DISPOSITION OF SPECIMEN:  PATHOLOGY  COUNTS:  YES  TOURNIQUET:  * No tourniquets in log *  DICTATION: .Note written in EPIC  PLAN OF CARE: Discharge to home after PACU  PATIENT DISPOSITION:  PACU - hemodynamically stable.   Delay start of Pharmacological VTE agent (>24hrs) due to surgical blood loss or risk of bleeding: not applicable  Complications: none  Medications:  None  Findings:  Normal pelvis.  No lesions or adhesions seen on  the L ovary peritoneum under ovaries, uterus, tube, cul de sac, anterior bladder or anterior abdominal wall.  The liver edge, diaphragm and appendix were all normal.  The R tube was normal caliber; however, the R ovary had a 2 cm lesion consisting of yellow and whitish tissue that was not the same as the rest of the ovary.  Pt had calcifications of this ovary seen on [place] and pt desired ovary out for any abnormality seen.  There were filmy adhesions of the cecum to the peritoneum which may explain her pain.  The uterine cavity appeared normal with a small polyp.   Technique: After adequate general anesthesia was achieved, the patient was prepped and draped in the usual fashion.  A uterine manipulator was placed in the cervix and secured.  The bladder was drained with a  foley.  A two cm incision was made above the umbilicus and the veress needle passed inside without aspiration of bowel contents or blood.  We were unable to insufflate the peritoneal cavity and therefore used the 11 mm optiview to gain access to the peritoneal cavity. The abdomen was then insufflated.  The camera was placed inside and a 8.5 mm trocar placed 3 above the right iliac crest under direct visualization of the camera.  The above findings were noted and the ureters identified out side of the field of operation for the ovary and adhesions.  The IP ligament was isolated and cauterized with the enseal.  The mesosalpinx under the R ovary and tube was then cauterized and cut until the uteroovarian ligament junction with the uterus.  This was then cauterized and cut with the enseal and the ovary and tube placed in a endocatch and removed through the 11 mm trocar site.  The adhesions of the cecum to the anterior abdominal wall were removed with the enseal cautery well away from the bladder.  A small amount of oozing was taken care of with Arista.  During the procedure the trocars occasionally slipped to preperitoneal and had to be replaced.  The preperitoneal insufflation was reduced as best as possible.  Once hemostasis was assured, the instruments were removed from the abdomen and the abdomen desufflated.  The 12 mm trocar fascia was closed with a figure of eight stitch and the skin incisions closed with subq 3-0vicryl R.  The incisions  were closed with dermabond.    Attention was then turned to the vagina.  The speculum was placed in the vagina, the uterine manipulator removed and the cervix stabilized with a single-tooth tenaculum.  The cervix was dilated with pratt dilators and the hysteroscope passed inside the endometrial cavity.  The above findings were noted and sharp curettage was then performed and uterine curettings sent to path.  All instruments were removed from the vagina.  The patient tolerated  the procedure well.    Latausha Flamm A

## 2015-12-25 NOTE — Anesthesia Preprocedure Evaluation (Addendum)
Anesthesia Evaluation  Patient identified by MRN, date of birth, ID band Patient awake    Reviewed: Allergy & Precautions, NPO status , Patient's Chart, lab work & pertinent test results  Airway Mallampati: I  TM Distance: >3 FB Neck ROM: Full    Dental  (+) Teeth Intact, Dental Advisory Given   Pulmonary neg pulmonary ROS,    breath sounds clear to auscultation       Cardiovascular hypertension, + dysrhythmias  Rhythm:Regular Rate:Normal     Neuro/Psych  Headaches, PSYCHIATRIC DISORDERS Depression    GI/Hepatic negative GI ROS, Neg liver ROS,   Endo/Other  diabetes, Type 1, Insulin Dependent  Renal/GU Renal InsufficiencyRenal disease  negative genitourinary   Musculoskeletal  (+) Fibromyalgia -  Abdominal   Peds negative pediatric ROS (+)  Hematology negative hematology ROS (+)   Anesthesia Other Findings   Reproductive/Obstetrics negative OB ROS                            Anesthesia Physical Anesthesia Plan  ASA: III  Anesthesia Plan: General   Post-op Pain Management:    Induction: Intravenous  Airway Management Planned: Oral ETT  Additional Equipment:   Intra-op Plan:   Post-operative Plan: Extubation in OR  Informed Consent: I have reviewed the patients History and Physical, chart, labs and discussed the procedure including the risks, benefits and alternatives for the proposed anesthesia with the patient or authorized representative who has indicated his/her understanding and acceptance.   Dental advisory given  Plan Discussed with: CRNA  Anesthesia Plan Comments: (Continue insulin pump at reduced rate. Current BG 200's. Will recheck in PACU. )      Anesthesia Quick Evaluation

## 2015-12-25 NOTE — Anesthesia Procedure Notes (Signed)
Procedure Name: Intubation Date/Time: 12/25/2015 8:51 AM Performed by: Gilmer Mor R Pre-anesthesia Checklist: Patient identified, Patient being monitored, Timeout performed, Emergency Drugs available and Suction available Patient Re-evaluated:Patient Re-evaluated prior to inductionOxygen Delivery Method: Circle System Utilized Preoxygenation: Pre-oxygenation with 100% oxygen Intubation Type: IV induction Ventilation: Mask ventilation without difficulty Laryngoscope Size: Mac and 3 Grade View: Grade II Tube type: Oral Tube size: 7.0 mm Number of attempts: 1 Airway Equipment and Method: stylet Placement Confirmation: ETT inserted through vocal cords under direct vision,  positive ETCO2 and breath sounds checked- equal and bilateral Secured at: 21 cm Tube secured with: Tape Dental Injury: Teeth and Oropharynx as per pre-operative assessment

## 2015-12-25 NOTE — Transfer of Care (Signed)
Immediate Anesthesia Transfer of Care Note  Patient: Katie Palmer  Procedure(s) Performed: Procedure(s): DILATATION AND CURETTAGE /HYSTEROSCOPY (N/A) LAPAROSCOPY OPERATIVE (N/A) LAPAROSCOPIC LYSIS OF ADHESIONS (N/A) LAPAROSCOPIC UNILATERAL SALPINGO OOPHORECTOMY (Right)  Patient Location: PACU  Anesthesia Type:General  Level of Consciousness: awake, alert , oriented and patient cooperative  Airway & Oxygen Therapy: Patient Spontanous Breathing and Patient connected to nasal cannula oxygen  Post-op Assessment: Report given to RN, Post -op Vital signs reviewed and stable and Patient moving all extremities X 4  Post vital signs: Reviewed and stable  Last Vitals:  Vitals:   12/25/15 0740 12/25/15 1048  BP: 129/79 140/77  Pulse: 89 (!) 103  Resp: 18 12  Temp: 36.9 C 36.2 C    Last Pain:  Vitals:   12/25/15 1048  TempSrc:   PainSc: 9       Patients Stated Pain Goal: 7 ([id number])  Complications: No apparent anesthesia complications

## 2015-12-25 NOTE — Progress Notes (Signed)
There has been no change in the patients history, status or exam since the history and physical.  Vitals:   12/23/15 1711 12/25/15 0740 12/25/15 0803  BP:  129/79   Pulse:  89   Resp:  18   Temp:  98.5 F (36.9 C)   TempSrc:  Oral   SpO2:  100%   Weight: 97.5 kg (215 lb) 97.5 kg (215 lb)   Height: 5\' 5"  (1.651 m)  5\' 5"  (1.651 m)    Lab Results  Component Value Date   WBC 7.6 06/03/2015   HGB 14.2 06/03/2015   HCT 42.0 06/03/2015   MCV 87.4 06/03/2015   PLT 204.0 06/03/2015    Kerry Odonohue A

## 2015-12-25 NOTE — Anesthesia Postprocedure Evaluation (Signed)
Anesthesia Post Note  Patient: Katie Palmer  Procedure(s) Performed: Procedure(s) (LRB): DILATATION AND CURETTAGE /HYSTEROSCOPY (N/A) LAPAROSCOPY OPERATIVE (N/A) LAPAROSCOPIC LYSIS OF ADHESIONS (N/A) LAPAROSCOPIC UNILATERAL SALPINGO OOPHORECTOMY (Right)  Patient location during evaluation: PACU Anesthesia Type: General Level of consciousness: awake and alert Pain management: pain level controlled Vital Signs Assessment: post-procedure vital signs reviewed and stable Respiratory status: spontaneous breathing, nonlabored ventilation, respiratory function stable and patient connected to nasal cannula oxygen Cardiovascular status: blood pressure returned to baseline and stable Postop Assessment: no signs of nausea or vomiting Anesthetic complications: no    Last Vitals:  Vitals:   12/25/15 1145 12/25/15 1200  BP: 114/66 112/64  Pulse: 88 100  Resp: 10 19  Temp:      Last Pain:  Vitals:   12/25/15 1130  TempSrc:   PainSc: [place]

## 2015-12-25 NOTE — Brief Op Note (Signed)
12/25/2015  10:36 AM  PATIENT:  Katie Palmer  65 y.o. female  PRE-OPERATIVE DIAGNOSIS:  RLQ PAIN, thickened endometrium  POST-OPERATIVE DIAGNOSIS:  R O lesion, adhesions, polyp in uterus  PROCEDURE:  Procedure(s): DILATATION AND CURETTAGE /HYSTEROSCOPY (N/A) LAPAROSCOPY OPERATIVE (N/A) LAPAROSCOPIC LYSIS OF ADHESIONS (N/A) LAPAROSCOPIC UNILATERAL SALPINGO OOPHORECTOMY (Right)  SURGEON:  Surgeon(s) and Role:    * Bobbye Charleston, MD - Primary    * Sanjuana Kava, MD - Assisting   ANESTHESIA:   general  EBL:  Total I/O In: [id number] [I.V.:1650] Out: 100 [Urine:50; Blood:50]  LOCAL MEDICATIONS USED:  OTHER arista  SPECIMEN:  Source of Specimen:  R tube and ovary, uterine currettings  DISPOSITION OF SPECIMEN:  PATHOLOGY  COUNTS:  YES  TOURNIQUET:  * No tourniquets in log *  DICTATION: .Note written in EPIC  PLAN OF CARE: Discharge to home after PACU  PATIENT DISPOSITION:  PACU - hemodynamically stable.   Delay start of Pharmacological VTE agent (>24hrs) due to surgical blood loss or risk of bleeding: not applicable  Complications: none  Medications:  None  Findings:  Normal pelvis.  No lesions or adhesions seen on  the L ovary peritoneum under ovaries, uterus, tube, cul de sac, anterior bladder or anterior abdominal wall.  The liver edge, diaphragm and appendix were all normal.  The R tube was normal caliber; however, the R ovary had a 2 cm lesion consisting of yellow and whitish tissue that was not the same as the rest of the ovary.  Pt had calcifications of this ovary seen on [place] and pt desired ovary out for any abnormality seen.  There were filmy adhesions of the cecum to the peritoneum which may explain her pain.  The uterine cavity appeared normal with a small polyp.   Technique: After adequate general anesthesia was achieved, the patient was prepped and draped in the usual fashion.  A uterine manipulator was placed in the cervix and secured.  The bladder was drained with a  foley.  A two cm incision was made above the umbilicus and the veress needle passed inside without aspiration of bowel contents or blood.  We were unable to insufflate the peritoneal cavity and therefore used the 11 mm optiview to gain access to the peritoneal cavity. The abdomen was then insufflated.  The camera was placed inside and a 8.5 mm trocar placed 3 above the right iliac crest under direct visualization of the camera.  The above findings were noted and the ureters identified out side of the field of operation for the ovary and adhesions.  The IP ligament was isolated and cauterized with the enseal.  The mesosalpinx under the R ovary and tube was then cauterized and cut until the uteroovarian ligament junction with the uterus.  This was then cauterized and cut with the enseal and the ovary and tube placed in a endocatch and removed through the 11 mm trocar site.  The adhesions of the cecum to the anterior abdominal wall were removed with the enseal cautery well away from the bladder.  A small amount of oozing was taken care of with Arista.  During the procedure the trocars occasionally slipped to preperitoneal and had to be replaced.  The preperitoneal insufflation was reduced as best as possible.  Once hemostasis was assured, the instruments were removed from the abdomen and the abdomen desufflated.  The 12 mm trocar fascia was closed with a figure of eight stitch and the skin incisions closed with subq 3-0vicryl R.  The incisions  were closed with dermabond.    Attention was then turned to the vagina.  The speculum was placed in the vagina, the uterine manipulator removed and the cervix stabilized with a single-tooth tenaculum.  The cervix was dilated with pratt dilators and the hysteroscope passed inside the endometrial cavity.  The above findings were noted and sharp curettage was then performed and uterine curettings sent to path.  All instruments were removed from the vagina.  The patient tolerated  the procedure well.    Fouad Taul A

## 2015-12-26 ENCOUNTER — Encounter (HOSPITAL_BASED_OUTPATIENT_CLINIC_OR_DEPARTMENT_OTHER): Payer: Self-pay | Admitting: Obstetrics and Gynecology

## 2015-12-26 LAB — POCT I-STAT, CHEM 8
BUN: 13 mg/dL (ref 6–20)
CHLORIDE: 103 mmol/L (ref 101–111)
Calcium, Ion: 1.18 mmol/L (ref 1.15–1.40)
Creatinine, Ser: 0.8 mg/dL (ref 0.44–1.00)
Glucose, Bld: 218 mg/dL — ABNORMAL HIGH (ref 65–99)
HCT: 41 % (ref 36.0–46.0)
Hemoglobin: 13.9 g/dL (ref 12.0–15.0)
POTASSIUM: 3.4 mmol/L — AB (ref 3.5–5.1)
SODIUM: 141 mmol/L (ref 135–145)
TCO2: 25 mmol/L (ref 0–100)

## 2015-12-26 LAB — GLUCOSE, CAPILLARY: Glucose-Capillary: 191 mg/dL — ABNORMAL HIGH (ref 65–99)

## 2016-01-01 ENCOUNTER — Encounter: Payer: Self-pay | Admitting: Family Medicine

## 2016-01-01 MED ORDER — HYDROCODONE-ACETAMINOPHEN 10-325 MG PO TABS
1.0000 | ORAL_TABLET | Freq: Three times a day (TID) | ORAL | 0 refills | Status: DC | PRN
Start: 2016-01-01 — End: 2016-10-29

## 2016-01-01 NOTE — Telephone Encounter (Signed)
Script is ready for pick up here at front office, I spoke with pt and she will schedule office visit.

## 2016-01-01 NOTE — Telephone Encounter (Signed)
The refill is ready to pick up. She should make an OV to discuss the other issues

## 2016-01-07 ENCOUNTER — Encounter: Payer: Self-pay | Admitting: Family Medicine

## 2016-01-07 ENCOUNTER — Ambulatory Visit (INDEPENDENT_AMBULATORY_CARE_PROVIDER_SITE_OTHER): Payer: Medicare Other | Admitting: Family Medicine

## 2016-01-07 VITALS — BP 122/80 | HR 91 | Resp 12 | Ht 65.0 in | Wt 216.2 lb

## 2016-01-07 DIAGNOSIS — E559 Vitamin D deficiency, unspecified: Secondary | ICD-10-CM

## 2016-01-07 DIAGNOSIS — R5382 Chronic fatigue, unspecified: Secondary | ICD-10-CM | POA: Diagnosis not present

## 2016-01-07 DIAGNOSIS — M797 Fibromyalgia: Secondary | ICD-10-CM | POA: Diagnosis not present

## 2016-01-07 DIAGNOSIS — W540XXA Bitten by dog, initial encounter: Secondary | ICD-10-CM

## 2016-01-07 DIAGNOSIS — S61452A Open bite of left hand, initial encounter: Secondary | ICD-10-CM | POA: Diagnosis not present

## 2016-01-07 LAB — VITAMIN D 25 HYDROXY (VIT D DEFICIENCY, FRACTURES): VITD: 24.86 ng/mL — AB (ref 30.00–100.00)

## 2016-01-07 MED ORDER — AMOXICILLIN-POT CLAVULANATE 875-125 MG PO TABS: 1.0000 | ORAL_TABLET | Freq: Two times a day (BID) | ORAL | 0 refills | Status: AC

## 2016-01-07 NOTE — Progress Notes (Signed)
HPI:  ACUTE VISIT:  Chief Complaint  Patient presents with  . Animal Bite    Ms.Katie Palmer is a 65 y.o. female, who is here today complaining of dog bite.  Last night around 11 pm while she was in bed with her 53 year old dog and her kitty cat; the latter one jumped on her dog, who tried to bite the cat.She placed her hand in between both and she accidentally got bitten by her dog on left thumb. She washed area with water and applied Neosporin. + Edema and erythema, no numbness or tingling.  Her dog has vaccines up to date and is indoor dog.   Hx of DM 1, she follows with Dr. Chalmers Cater.   -She is also requesting lab done today to check Vit D. She is c/o fatigue, feeling "owfull"  Also requesting B12 injection for "energy." She has history of vitamin D deficiency, she discontinue supplementation because didn't feel like it was helping with energy. No known Hx of B12 deficiency. She has history of fibromyalgia and chronic fatigue as well as depression.  She follows with psychiatrist regularly, she is on Cymbalta 90 mg daily as well as Gabapentin 600 mg daily.    She is also requesting vaccination update, states that she has not had a physical in a while.   Lab Results  Component Value Date   WBC 7.6 06/03/2015   HGB 13.9 12/25/2015   HCT 41.0 12/25/2015   MCV 87.4 06/03/2015   PLT 204.0 06/03/2015   Lab Results  Component Value Date   TSH 2.47 06/03/2015      Review of Systems  Constitutional: Positive for fatigue. Negative for chills and fever.  HENT: Negative for mouth sores, nosebleeds, sore throat and trouble swallowing.   Respiratory: Negative for cough, shortness of breath and wheezing.   Musculoskeletal: Positive for arthralgias, back pain and myalgias.  Skin: Positive for wound.  Neurological: Negative for weakness and numbness.  Psychiatric/Behavioral: Negative for confusion. The patient is nervous/anxious.       Current Outpatient  Prescriptions on File Prior to Visit  Medication Sig Dispense Refill  . atorvastatin (LIPITOR) 10 MG tablet TAKE 1 TABLET BY MOUTH EVERY DAY (Patient taking differently: TAKE 1 TABLET BY MOUTH EVERY DAY--  TAKES IN PM  (PROTECT KIDNEYS)) 90 tablet 3  . DULoxetine (CYMBALTA) 30 MG capsule Take 1 capsule by mouth every evening.   2  . gabapentin (NEURONTIN) 100 MG capsule Take 1 capsule (100 mg total) by mouth daily. (Patient taking differently: Take 100-200 mg by mouth daily. ) 90 capsule 3  . gabapentin (NEURONTIN) 300 MG capsule Take 300 mg by mouth at bedtime.    Marland Kitchen GLUCAGON EMERGENCY 1 MG injection Reported on 06/03/2015  2  . HYDROcodone-acetaminophen (NORCO) 10-325 MG tablet Take 1 tablet by mouth every 8 (eight) hours as needed for moderate pain. 90 tablet 0  . losartan-hydrochlorothiazide (HYZAAR) 50-12.5 MG tablet TAKE 1 TABLET BY MOUTH EVERY DAY (3/9) (Patient taking differently: TAKE 1 TABLET BY MOUTH EVERY DAY (3/9)  (TAKES FOR DM TYPE 1)) 90 tablet 2  . meclizine (ANTIVERT) 25 MG tablet TAKE 1 TABLET (25 MG TOTAL) BY MOUTH EVERY 4 (FOUR) HOURS AS NEEDED FOR DIZZINESS. 180 tablet 0  . NOVOLOG 100 UNIT/ML injection UNITS BY PUMP DEPENDS ON SUGAR READINGS, SLIDING SCALE    . oxyCODONE-acetaminophen (ROXICET) 5-325 MG tablet Take 1 tablet by mouth every 4 (four) hours as needed for severe pain. 30 tablet  0  . SUMAtriptan (IMITREX) 100 MG tablet TAKE 1 TABLET BY MOUTH AS NEEDED FOR MIGRAINE OR HEADACHE MAY REPEAT IN 2 HOURS IF HEADACHE PERSISIT 30 tablet 3  . traZODone (DESYREL) 100 MG tablet Take 1 tablet by mouth at bedtime.  1   No current facility-administered medications on file prior to visit.      Past Medical History:  Diagnosis Date  . Depression   . Fibromyalgia   . Headache(784.0)   . History of colon polyps   . History of diabetes with ketoacidosis   . Hx of colonic polyps   . Hypertension   . Insomnia   . Insulin pump in place   . RBBB (right bundle branch block)   .  RLQ abdominal pain   . Thickened endometrium   . Type 1 diabetes mellitus with long-term current use of insulin [facility]) endocrinologist-- dr Chalmers Cater   July 2015--  pt took Invokana and caused complete pancreas shut-down , pt went from pre-diabetic to adult type 1 dm now insulin dependent  . Vertigo    Allergies  Allergen Reactions  . Invokana [Canagliflozin] Other (See Comments)    "shut down pancreas"   . Lasix [Furosemide] Diarrhea    Severe diarrhea    Social History   Social History  . Marital status: Divorced    Spouse name: N/A  . Number of children: 1  . Years of education: N/A   Occupational History  . Life insurance agent    Social History Main Topics  . Smoking status: Never Smoker  . Smokeless tobacco: Never Used  . Alcohol use No  . Drug use: No  . Sexual activity: No   Other Topics Concern  . None   Social History Narrative   Divorced.  Son and grandson live with her.  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    Vitals:   01/07/16 1339  BP: 122/80  Pulse: 91  Resp: 12   Body mass index is 35.99 kg/m.     Physical Exam  Nursing note and vitals reviewed. Constitutional: She is oriented to person, place, and time. She appears well-developed. She does not appear ill. No distress.  HENT:  Head: Atraumatic.  Eyes: Conjunctivae are normal.  Cardiovascular: Normal rate and regular rhythm.   Pulses:      Radial pulses are 2+ on the left side.  Respiratory: Effort normal and breath sounds normal. No respiratory distress.  Musculoskeletal:       Hands: Right thumb normal ROM IP joints.  Some trigger points on chest wall, none on back or upper extremities.  Lymphadenopathy:    She has no cervical adenopathy.  Neurological: She is alert and oriented to person, place, and time. Coordination and gait normal.  Skin: Skin is warm. Laceration noted. No rash noted. No cyanosis or erythema.  Left thumb, radial aspect 1 cm linear,  superficial laceration, mild edema, no erythema or induration. No active bleeding or drainage.  Psychiatric: Her mood appears anxious. Her affect is labile.  Well groomed, good eye contact.      ASSESSMENT AND PLAN:     Katie Palmer was seen today for animal bite.  Diagnoses and all orders for this visit:  Dog bite, hand, left, initial encounter  Tdap around 5 years ago. Prophylactic treatment with oral Abx recommended. Keep left hand elevated. Monitor for signs of infection or other complication. Keep wound clean with soap and water. F/U as needed.   -  amoxicillin-clavulanate (AUGMENTIN) 875-125 MG tablet; Take 1 tablet by mouth 2 (two) times daily.  Vitamin D deficiency  No changes in current management, will follow labs done today and will give further recommendations accordingly.  -     VITAMIN D 25 Hydroxy (Vit-D Deficiency, Fractures)  Fibromyalgia  Educated about physiopathology of fibromyalgia and chronic fatigue. Continue Cymbalta and Gabapentin.  Chronic fatigue  We dicussed possible causes, some of her chronic medical problems can cause or aggravate problem. Explained that B12 is not recommended to treat fatigue unless there is B12 deficiency. Regular physical activity may help. Continue following with psychiatrists for depression.   Total visit 26 min, > 50% dedicated to discussion of some of her chronic medical problems, treatments available and prognosis. She was instructed to continue following with psychiatrist and PCP. Medicare physical to be arranged.    Return if symptoms worsen or fail to improve, for Follow with PCP in 2 months and Appt with Ms Raelyn Mora for Medicare prevent.     -Katie Palmer was advised to return or notify a doctor immediately if symptoms worsen or new concerns arise.       Betty G. Martinique, MD  [facility]. [place] office.

## 2016-01-07 NOTE — Patient Instructions (Signed)
  Ms.Clover Fayson I have seen you today for an acute visit.  1. Vitamin D deficiency  - VITAMIN D 25 Hydroxy (Vit-D Deficiency, Fractures)  2. Fibromyalgia   3. Dog bite, hand, left, initial encounter  - amoxicillin-clavulanate (AUGMENTIN) 875-125 MG tablet; Take 1 tablet by mouth 2 (two) times daily.  Dispense: 14 tablet; Refill: 0    In general please monitor for signs of worsening symptoms and seek immediate medical attention if any concerning/warning symptom.  Keep left hand elevated intermittently. Clean with soap and water.  I do not see B12 lab done, so B12 injection not recommended today.    Please be sure you have an appointment already scheduled with your PCP before you leave today.

## 2016-01-07 NOTE — Progress Notes (Signed)
Pre visit review using our clinic review tool, if applicable. No additional management support is needed unless otherwise documented below in the visit note. 

## 2016-01-08 ENCOUNTER — Ambulatory Visit: Payer: Medicare Other | Admitting: *Deleted

## 2016-01-08 ENCOUNTER — Encounter: Payer: Self-pay | Admitting: Family Medicine

## 2016-01-09 ENCOUNTER — Encounter: Payer: Self-pay | Admitting: Family Medicine

## 2016-01-09 ENCOUNTER — Ambulatory Visit (INDEPENDENT_AMBULATORY_CARE_PROVIDER_SITE_OTHER): Payer: Medicare Other | Admitting: Family Medicine

## 2016-01-09 VITALS — BP 118/88 | HR 78 | Temp 98.2°F | Ht 65.0 in | Wt 216.0 lb

## 2016-01-09 DIAGNOSIS — M25571 Pain in right ankle and joints of right foot: Secondary | ICD-10-CM | POA: Diagnosis not present

## 2016-01-09 DIAGNOSIS — Z23 Encounter for immunization: Secondary | ICD-10-CM

## 2016-01-09 DIAGNOSIS — R531 Weakness: Secondary | ICD-10-CM

## 2016-01-09 DIAGNOSIS — G8929 Other chronic pain: Secondary | ICD-10-CM | POA: Diagnosis not present

## 2016-01-09 NOTE — Addendum Note (Signed)
Addended by: Aggie Hacker A on: 01/09/2016 12:38 PM   Modules accepted: Orders

## 2016-01-09 NOTE — Progress Notes (Signed)
   Subjective:    Patient ID: Katie Palmer, female    DOB: [birth year], 65 y.o.   MRN: [id number]  HPI Here to discuss immunizations and to check her right ankle. The ankle has been swollen and painful for 2 months, although she does not remember any specific injuries. She had a flu shot last month but she has never had a pneumonia shot.    Review of Systems  Constitutional: Negative.   Respiratory: Negative.   Cardiovascular: Negative.   Neurological: Negative.        Objective:   Physical Exam  Constitutional: She is oriented to person, place, and time. She appears well-developed and well-nourished.  Cardiovascular: Normal rate, regular rhythm, normal heart sounds and intact distal pulses.   Pulmonary/Chest: Effort normal and breath sounds normal.  Musculoskeletal:  The right ankle is not swollen or warm or red, but she is tender superior to the lateral malleolus. Full ROM  Neurological: She is alert and oriented to person, place, and time.          Assessment & Plan:  Immunization update. She is given a TDaP and a Prevnar 13. Refer to Orthopedics for the ankle pain.  Laurey Morale, MD

## 2016-01-09 NOTE — Progress Notes (Signed)
Pre visit review using our clinic review tool, if applicable. No additional management support is needed unless otherwise documented below in the visit note. 

## 2016-01-10 NOTE — Telephone Encounter (Signed)
Before we can give B12 shots we need to check her blood level. I put in an order for this so she can set up a lab draw. Call in vit D 50,000 unit capsules to take weekly, #12 with one rf

## 2016-01-11 ENCOUNTER — Other Ambulatory Visit: Payer: Self-pay | Admitting: Family Medicine

## 2016-01-13 ENCOUNTER — Other Ambulatory Visit: Payer: Self-pay | Admitting: Family Medicine

## 2016-01-13 MED ORDER — VITAMIN D (ERGOCALCIFEROL) 1.25 MG (50000 UNIT) PO CAPS: 50000.0000 [IU] | ORAL_CAPSULE | ORAL | 1 refills | Status: DC

## 2016-01-15 ENCOUNTER — Other Ambulatory Visit: Payer: Self-pay | Admitting: Family Medicine

## 2016-01-15 NOTE — Telephone Encounter (Signed)
Sent pt a mychart message. 

## 2016-01-26 ENCOUNTER — Other Ambulatory Visit: Payer: Self-pay | Admitting: Family Medicine

## 2016-02-20 ENCOUNTER — Ambulatory Visit (INDEPENDENT_AMBULATORY_CARE_PROVIDER_SITE_OTHER): Payer: Medicare Other

## 2016-02-20 ENCOUNTER — Encounter (INDEPENDENT_AMBULATORY_CARE_PROVIDER_SITE_OTHER): Payer: Self-pay | Admitting: Orthopaedic Surgery

## 2016-02-20 ENCOUNTER — Ambulatory Visit (INDEPENDENT_AMBULATORY_CARE_PROVIDER_SITE_OTHER): Payer: Medicare Other | Admitting: Orthopaedic Surgery

## 2016-02-20 VITALS — Ht 65.0 in | Wt 216.0 lb

## 2016-02-20 DIAGNOSIS — M25571 Pain in right ankle and joints of right foot: Secondary | ICD-10-CM

## 2016-02-20 NOTE — Progress Notes (Signed)
Office Visit Note   Patient: Katie Palmer           Date of Birth: 05-Jun-1950           MRN: [id number] Visit Date: 02/20/2016              Requested by: Laurey Morale, MD [address] [place], [place] [address] PCP: Alysia Penna, MD   Assessment & Plan: Visit Diagnoses: Sprain right ankle   Plan: ASO ankle support. Follow up 3-4 weeks if no improvement.  Follow-Up Instructions: No Follow-up on file.   Orders:  No orders of the defined types were placed in this encounter.  No orders of the defined types were placed in this encounter.     Procedures: No procedures performed   Clinical Data: No additional findings.   Subjective: No chief complaint on file.    Pt referred PCP Dr. [name].The Right ankle has been  painful for 2 months,with no specific injury but during the snow 02/11/16 she rolled/twisted her Right ankle again. Edema, ecchymosis and pain has become increasingly worse.  Adult onset diabetes with 24hr insulin pump  [name] injured her right ankle in the snow last week. She  relates an injury where she had a hyperflexion injury to her knee and "rolled" her ankle beneath her. She's been having pain since that time.  Review of Systems   Objective: Vital Signs: Ht 5\' 5"  (1.651 m)   Wt 216 lb (98 kg)   BMI 35.94 kg/m   Physical Exam  Ortho Exam examination of the right ankle demonstrates skin to be intact. There is tenderness over the anterior talofibular and fibulocalcaneal ligaments. No evidence of instability. No tenderness medially. Skin intact. Good pulses. 8 over the peroneal tendons. I'll tenderness of the metacarpal phalangeal joint of the great toe was some limited extension consistent with a mild hallux rigidus. Very minimal ecchymosis at the base of the toes  Specialty Comments:  No specialty comments available.  Imaging: No results found.   PMFS History: Patient Active Problem List   Diagnosis Date Noted  . Vitamin D  deficiency 01/07/2016  . Hypertension 06/03/2015  . Diabetes mellitus type 1, uncontrolled ([place]) 06/01/2014  . DKA, type 1 ([place]) 05/20/2014  . Depression 05/20/2014  . AKI (acute kidney injury) ([place]) 05/20/2014  . Dyslipidemia 04/12/2014  . Leukocytosis 07/28/2013  . Fibromyalgia 05/27/2011  . Essential hypertension 12/15/2006  . INSOMNIA 12/15/2006  . HEADACHE 12/15/2006  . CHEST PAIN 12/15/2006  . COLONIC POLYPS, HX OF 12/15/2006   Past Medical History:  Diagnosis Date  . Depression   . Fibromyalgia   . Headache(784.0)   . History of colon polyps   . History of diabetes with ketoacidosis   . Hx of colonic polyps   . Hypertension   . Insomnia   . Insulin pump in place   . RBBB (right bundle branch block)   . RLQ abdominal pain   . Thickened endometrium   . Type 1 diabetes mellitus with long-term current use of insulin [facility]) endocrinologist-- dr Chalmers Cater   July 2015--  pt took Invokana and caused complete pancreas shut-down , pt went from pre-diabetic to adult type 1 dm now insulin dependent  . Vertigo     Family History  Problem Relation Age of Onset  . Alcohol abuse Father     Died age 24  . Heart disease Father   . Aneurysm Mother     Died age 74  . Leukemia  Brother     Died age 87  . COPD Sister   . Rheum arthritis Sister   . Heart failure Sister   . Colon cancer Neg Hx     Past Surgical History:  Procedure Laterality Date  . CARDIOVASCULAR STRESS TEST  07/26/2015   normal myocardial perfusion study w/ no ischemia/  normal LV function and wall motion , ef 71%  . COLONOSCOPY WITH PROPOFOL  last one 10/ 2017  . HYSTEROSCOPY W/D&C N/A 12/25/2015   Procedure: DILATATION AND CURETTAGE /HYSTEROSCOPY;  Surgeon: Bobbye Charleston, MD;  Location: [facility];  Service: Gynecology;  Laterality: N/A;  . LAPAROSCOPIC LYSIS OF ADHESIONS N/A 12/25/2015   Procedure: LAPAROSCOPIC LYSIS OF ADHESIONS;  Surgeon: Bobbye Charleston, MD;  Location: [place];  Service: Gynecology;  Laterality: N/A;  . LAPAROSCOPIC UNILATERAL SALPINGO OOPHERECTOMY Right 12/25/2015   Procedure: LAPAROSCOPIC UNILATERAL SALPINGO OOPHORECTOMY;  Surgeon: Bobbye Charleston, MD;  Location: [place];  Service: Gynecology;  Laterality: Right;  . LAPAROSCOPY N/A 12/25/2015   Procedure: LAPAROSCOPY OPERATIVE;  Surgeon: Bobbye Charleston, MD;  Location: [facility];  Service: Gynecology;  Laterality: N/A;  . TONSILLECTOMY  age 77  . TRANSTHORACIC ECHOCARDIOGRAM  07/26/2015   grade 1 diastolic dysfunction , ef 60-65%/  trivial TR   Social History   Occupational History  . Life insurance agent    Social History Main Topics  . Smoking status: Never Smoker  . Smokeless tobacco: Never Used  . Alcohol use No  . Drug use: No  . Sexual activity: No

## 2016-02-20 NOTE — Addendum Note (Signed)
Addended by: Shona Needles on: 02/20/2016 03:34 PM   Modules accepted: Orders

## 2016-02-26 ENCOUNTER — Other Ambulatory Visit: Payer: Self-pay | Admitting: Family Medicine

## 2016-02-27 NOTE — Telephone Encounter (Signed)
OK to fill.  Thanks.

## 2016-02-27 NOTE — Telephone Encounter (Signed)
Call in #[phone] with 5 rf 

## 2016-03-05 ENCOUNTER — Other Ambulatory Visit: Payer: Self-pay | Admitting: Family Medicine

## 2016-03-05 MED ORDER — TRAMADOL HCL 50 MG PO TABS: 100.0000 mg | ORAL_TABLET | ORAL | 3 refills | Status: DC | PRN

## 2016-03-05 NOTE — Telephone Encounter (Signed)
Per Dr. [name] call in Tramadol 50 mg take 2 tablets every 4 hours as needed. I called in script to [facility].

## 2016-05-01 ENCOUNTER — Other Ambulatory Visit: Payer: Self-pay | Admitting: Family Medicine

## 2016-05-01 NOTE — Telephone Encounter (Signed)
Last office visit in December 2017 for lab follow up; patient requesting xanax refilled, chart states med is historical, ok to refill? Please advise.

## 2016-05-04 NOTE — Telephone Encounter (Signed)
Call in 1 mg to take at bedtime, #30 with 5 rf

## 2016-06-24 ENCOUNTER — Other Ambulatory Visit: Payer: Self-pay | Admitting: Family Medicine

## 2016-06-24 NOTE — Telephone Encounter (Signed)
Can we refill this? 

## 2016-07-22 ENCOUNTER — Ambulatory Visit (INDEPENDENT_AMBULATORY_CARE_PROVIDER_SITE_OTHER): Payer: Medicare Other | Admitting: Orthopedic Surgery

## 2016-07-28 ENCOUNTER — Ambulatory Visit (INDEPENDENT_AMBULATORY_CARE_PROVIDER_SITE_OTHER): Payer: Medicare Other | Admitting: Orthopaedic Surgery

## 2016-08-04 ENCOUNTER — Ambulatory Visit (INDEPENDENT_AMBULATORY_CARE_PROVIDER_SITE_OTHER): Payer: Medicare Other | Admitting: Orthopaedic Surgery

## 2016-08-05 ENCOUNTER — Other Ambulatory Visit: Payer: Self-pay | Admitting: Obstetrics and Gynecology

## 2016-08-05 DIAGNOSIS — Z1231 Encounter for screening mammogram for malignant neoplasm of breast: Secondary | ICD-10-CM

## 2016-08-13 ENCOUNTER — Ambulatory Visit
Admission: RE | Admit: 2016-08-13 | Discharge: 2016-08-13 | Disposition: A | Discharge status: Home / Self Care | Payer: Medicare Other | Source: Ambulatory Visit | Attending: Obstetrics and Gynecology | Admitting: Obstetrics and Gynecology

## 2016-08-13 DIAGNOSIS — Z1231 Encounter for screening mammogram for malignant neoplasm of breast: Secondary | ICD-10-CM

## 2016-08-15 ENCOUNTER — Other Ambulatory Visit: Payer: Self-pay | Admitting: Family Medicine

## 2016-08-25 ENCOUNTER — Other Ambulatory Visit: Payer: Self-pay | Admitting: Family Medicine

## 2016-08-27 ENCOUNTER — Other Ambulatory Visit: Payer: Self-pay | Admitting: Family Medicine

## 2016-08-27 NOTE — Telephone Encounter (Signed)
Call in #360 with 3 rf 

## 2016-08-31 ENCOUNTER — Encounter: Payer: Self-pay | Admitting: Family Medicine

## 2016-08-31 DIAGNOSIS — E1065 Type 1 diabetes mellitus with hyperglycemia: Secondary | ICD-10-CM

## 2016-08-31 DIAGNOSIS — E108 Type 1 diabetes mellitus with unspecified complications: Principal | ICD-10-CM

## 2016-08-31 DIAGNOSIS — R2689 Other abnormalities of gait and mobility: Secondary | ICD-10-CM

## 2016-08-31 DIAGNOSIS — IMO0002 Reserved for concepts with insufficient information to code with codable children: Secondary | ICD-10-CM

## 2016-09-01 NOTE — Telephone Encounter (Signed)
Both referrals are done.

## 2016-09-02 ENCOUNTER — Ambulatory Visit: Payer: Medicare Other | Admitting: Nurse Practitioner

## 2016-09-16 ENCOUNTER — Ambulatory Visit: Payer: Medicare Other | Admitting: *Deleted

## 2016-09-21 ENCOUNTER — Ambulatory Visit: Payer: Medicare Other | Attending: Family Medicine | Admitting: Physical Therapy

## 2016-09-21 ENCOUNTER — Encounter: Payer: Self-pay | Admitting: Physical Therapy

## 2016-09-21 DIAGNOSIS — M6281 Muscle weakness (generalized): Secondary | ICD-10-CM

## 2016-09-21 DIAGNOSIS — R2689 Other abnormalities of gait and mobility: Secondary | ICD-10-CM | POA: Insufficient documentation

## 2016-09-21 NOTE — Therapy (Addendum)
[facility] [address] [place], [place], [address] Phone: [phone]   Fax:  [phone]  Physical Therapy Evaluation  Patient Details  Name: Katie Palmer MRN: [id number] Date of Birth: 10-17-1950 Referring Provider: Dr. Alysia Penna  Encounter Date: 09/21/2016      PT End of Session - 09/21/16 1305    Visit Number 1   Number of Visits 10   Date for PT Re-Evaluation 11/16/16   Authorization Type medicare g-code 10th visi; KX modifier 15 the visit   PT Start Time 1233   PT Stop Time 1305   PT Time Calculation (min) 32 min   Activity Tolerance Patient tolerated treatment well   Behavior During Therapy [facility] for tasks assessed/performed      Past Medical History:  Diagnosis Date  . Depression   . Fibromyalgia   . Headache(784.0)   . History of colon polyps   . History of diabetes with ketoacidosis   . Hx of colonic polyps   . Hypertension   . Insomnia   . Insulin pump in place   . RBBB (right bundle branch block)   . RLQ abdominal pain   . Thickened endometrium   . Type 1 diabetes mellitus with long-term current use of insulin [facility]) endocrinologist-- dr Chalmers Cater   July 2015--  pt took Invokana and caused complete pancreas shut-down , pt went from pre-diabetic to adult type 1 dm now insulin dependent  . Vertigo     Past Surgical History:  Procedure Laterality Date  . CARDIOVASCULAR STRESS TEST  07/26/2015   normal myocardial perfusion study w/ no ischemia/  normal LV function and wall motion , ef 71%  . COLONOSCOPY WITH PROPOFOL  last one 10/ 2017  . HYSTEROSCOPY W/D&C N/A 12/25/2015   Procedure: DILATATION AND CURETTAGE /HYSTEROSCOPY;  Surgeon: Bobbye Charleston, MD;  Location: [facility];  Service: Gynecology;  Laterality: N/A;  . LAPAROSCOPIC LYSIS OF ADHESIONS N/A 12/25/2015   Procedure: LAPAROSCOPIC LYSIS OF ADHESIONS;  Surgeon: Bobbye Charleston, MD;  Location: [place];  Service: Gynecology;  Laterality: N/A;  . LAPAROSCOPIC UNILATERAL SALPINGO OOPHERECTOMY Right 12/25/2015   Procedure: LAPAROSCOPIC UNILATERAL SALPINGO OOPHORECTOMY;  Surgeon: Bobbye Charleston, MD;  Location: [place];  Service: Gynecology;  Laterality: Right;  . LAPAROSCOPY N/A 12/25/2015   Procedure: LAPAROSCOPY OPERATIVE;  Surgeon: Bobbye Charleston, MD;  Location: [facility];  Service: Gynecology;  Laterality: N/A;  . TONSILLECTOMY  age 73  . TRANSTHORACIC ECHOCARDIOGRAM  07/26/2015   grade 1 diastolic dysfunction , ef 60-65%/  trivial TR    There were no vitals filed for this visit.       Subjective Assessment - 09/21/16 1240    Subjective Patient reports issues with tipping over a little and catch herself.  Unable to walk straight without tilting. Started 02/2016.   Patient Stated Goals be able to have strength and not fall over   Currently in Pain? No/denies   Multiple Pain Sites No            [facility] PT Assessment - 09/21/16 0001      Assessment   Medical Diagnosis R26.89 Balance disorder   Referring Provider Dr. Alysia Penna   Onset Date/Surgical Date 02/27/16   Prior Therapy None     Precautions   Precautions None     Restrictions   Weight Bearing Restrictions No     Balance Screen   Has the patient fallen in the  past 6 months Yes   How many times? 1  due to wrong dose of Gabapentin   Has the patient had a decrease in activity level because of a fear of falling?  No   Is the patient reluctant to leave their home because of a fear of falling?  No     Home Ecologist residence     Prior Function   Level of Independence Independent   Vocation Part time employment   Leisure not on an exercise routine     Cognition   Overall Cognitive Status Within Functional Limits for tasks assessed     Posture/Postural Control   Posture/Postural Control No significant limitations     ROM / Strength    AROM / PROM / Strength AROM;PROM;Strength     Strength   Right Hip Extension 4/5   Right Hip ABduction 4/5   Left Hip Extension 4/5   Left Hip ABduction 4/5   Right Ankle Plantar Flexion 4/5   Left Ankle Plantar Flexion 4/5     Berg Balance Test   Sit to Stand Able to stand without using hands and stabilize independently   Standing Unsupported Able to stand 2 minutes with supervision   Sitting with Back Unsupported but Feet Supported on Floor or Stool Able to sit safely and securely 2 minutes   Stand to Sit Sits safely with minimal use of hands   Transfers Able to transfer safely, minor use of hands   Standing Unsupported with Eyes Closed Able to stand 10 seconds safely   Standing Ubsupported with Feet Together Able to place feet together independently and stand 1 minute safely   From Standing, Reach Forward with Outstretched Arm Can reach confidently >25 cm (10")   From Standing Position, Pick up Object from Floor Able to pick up shoe safely and easily   From Standing Position, Turn to Look Behind Over each Shoulder Looks behind from both sides and weight shifts well   Turn 360 Degrees Able to turn 360 degrees safely in 4 seconds or less   Standing Unsupported, Alternately Place Feet on Step/Stool Able to stand independently and safely and complete 8 steps in 20 seconds   Standing Unsupported, One Foot in [employer] balance while stepping or standing   Standing on One Leg Tries to lift leg/unable to hold 3 seconds but remains standing independently   Total Score 48   Berg comment: 50% chance of falling            Objective measurements completed on examination: See above findings.                    PT Short Term Goals - 09/21/16 1312      PT SHORT TERM GOAL #1   Title be independent in initial HEP   Time 4   Period Weeks   Status New   Target Date 10/19/16     PT SHORT TERM GOAL #2   Title vertigo further assessed and new goals made if needed    Time 4   Period Weeks   Status New   Target Date 10/19/16     PT SHORT TERM GOAL #3   Title ----     PT SHORT TERM GOAL #4   Title ---           PT Long Term Goals - 09/21/16 1255      PT LONG TERM GOAL #1   Title be  independent in advanced HEP   Time 8   Period Weeks   Status New   Target Date 11/16/16     PT LONG TERM GOAL #2   Title stand on leg for 10 second to help with walking and stairs   Time 8   Period Weeks   Status New   Target Date 11/16/16     PT LONG TERM GOAL #3   Title walking in a straight line without leaning to one side due to increased bilateral hip and ankle strength   Time 8   Period Weeks   Status New   Target Date 11/16/16     PT LONG TERM GOAL #4   Title Berg balance score >/= 54/56   Time 8   Period Weeks   Status New   Target Date 11/16/16     PT LONG TERM GOAL #5   Title able to turn head and and walk in a circle with vertigo reduce >/= 50%   Time 8   Period Weeks   Status New   Target Date 11/16/16                Plan - 09/21/16 1306    Clinical Impression Statement Patient is a 66 year old female with difficulty with balance since 02/2016.  Patient reports she will walk and lean to one side.  Patient has fallen one time due to her medication.  Patient will get dizzy with turn around in a circle and turning to look behind her. Patient bilateral hip abduction and extension and bil. plantarflexion 4/5. Berg balance is 48/56 with difficulty with one legged stance, tandem stance and turning in a circle.  Patient will benefit from skilled therapy to improve strength, improve balance and assess her vertigo further.    History and Personal Factors relevant to plan of care: None   Clinical Presentation Stable   Clinical Presentation due to: stable condition   Clinical Decision Making Low   Rehab Potential Excellent   Clinical Impairments Affecting Rehab Potential none   PT Frequency 2x / week   PT Duration 8 weeks   PT  Treatment/Interventions Therapeutic activities;Therapeutic exercise;Balance training;Neuromuscular re-education;Patient/family education   PT Next Visit Plan patient will go to our neuro facility to have her balance and vertigo further assessed. Hip strength; vertigo further assessed; balance exercise; plantarflexion strength   PT Home Exercise Plan progress as needed   Consulted and Agree with Plan of Care Patient      Patient will benefit from skilled therapeutic intervention in order to improve the following deficits and impairments:  Abnormal gait, Decreased strength, Decreased mobility, Decreased balance, Decreased activity tolerance  Visit Diagnosis: Muscle weakness (generalized) - Plan: PT plan of care cert/re-cert  Other abnormalities of gait and mobility - Plan: PT plan of care cert/re-cert      G-Codes - 76/28/31 1313    Functional Assessment Tool Used (Outpatient Only) Berg balance score is 48/56 indicating 50% chance of falling  goal is 54/56 berg balance score   Functional Limitation Mobility: Walking and moving around   Mobility: Walking and Moving Around Current Status [phone]) At least 40 percent but less than 60 percent impaired, limited or restricted   Mobility: Walking and Moving Around Goal Status [phone]) At least 20 percent but less than 40 percent impaired, limited or restricted       Problem List Patient Active Problem List   Diagnosis Date Noted  . Vitamin D deficiency 01/07/2016  .  Hypertension 06/03/2015  . Diabetes mellitus type 1, uncontrolled ([place]) 06/01/2014  . DKA, type 1 ([place]) 05/20/2014  . Depression 05/20/2014  . AKI (acute kidney injury) ([place]) 05/20/2014  . Dyslipidemia 04/12/2014  . Leukocytosis 07/28/2013  . Fibromyalgia 05/27/2011  . Essential hypertension 12/15/2006  . INSOMNIA 12/15/2006  . HEADACHE 12/15/2006  . CHEST PAIN 12/15/2006  . COLONIC POLYPS, HX OF 12/15/2006    Earlie Counts, PT 09/21/16 1:16 PM   [facility] [address] [place], [place], [address] Phone: [phone]   Fax:  [phone]  Name: Maressa Apollo MRN: [id number] Date of Birth: [birth year] PHYSICAL THERAPY DISCHARGE SUMMARY  Visits from Start of Care: 1 Current functional level related to goals / functional outcomes: See above.    Remaining deficits: See above. Unable to fully assess patient for discharge due to her not returning after initial evaluation.    Education / Equipment: HEP  Plan:                                                    Patient goals were not met. Patient is being discharged due to not returning since the last visit.  Thank you for the referral. Earlie Counts, PT 02/18/17 3:10 PM  ?????

## 2016-09-22 ENCOUNTER — Encounter: Payer: Self-pay | Admitting: *Deleted

## 2016-09-24 ENCOUNTER — Ambulatory Visit: Payer: Medicare Other | Admitting: Rehabilitative and Restorative Service Providers"

## 2016-09-30 ENCOUNTER — Ambulatory Visit: Payer: Medicare Other | Admitting: Rehabilitative and Restorative Service Providers"

## 2016-10-06 ENCOUNTER — Ambulatory Visit: Payer: Medicare Other | Admitting: *Deleted

## 2016-10-07 ENCOUNTER — Ambulatory Visit (INDEPENDENT_AMBULATORY_CARE_PROVIDER_SITE_OTHER): Payer: Medicare Other | Admitting: Orthopedic Surgery

## 2016-10-07 ENCOUNTER — Ambulatory Visit: Payer: Medicare Other | Admitting: *Deleted

## 2016-10-07 DIAGNOSIS — M76821 Posterior tibial tendinitis, right leg: Secondary | ICD-10-CM | POA: Insufficient documentation

## 2016-10-07 NOTE — Progress Notes (Signed)
Office Visit Note   Patient: Katie Palmer           Date of Birth: 1950-03-10           MRN: [id number] Visit Date: 10/07/2016              Requested by: Laurey Morale, MD [place], [place] [address] PCP: Laurey Morale, MD  Chief Complaint  Patient presents with  . Right Ankle - Pain      HPI: Patient is a 66 year old woman with diabetes who has been having pain along her posterior tibial tendon. She states that she has been having shooting pain in this area 4 months. Pain is only present with walking. No pain at rest.  Assessment & Plan: Visit Diagnoses:  1. Posterior tibial tendinitis, right leg     Plan: We will place her in a posterior tibial tendon brace. After she improves with using the brace she will obtain a pair of Trail running sneakers with sole orthotics.  Follow-Up Instructions: Return in about 4 weeks (around 11/04/2016).   Ortho Exam  Patient is alert, oriented, no adenopathy, well-dressed, normal affect, normal respiratory effort. Examination she has a normal gait. Patient has pain with attempted single limb heel raise on the right. She has tenderness to palpation along the posterior tibial tendon. She has good pulses. She has good ankle good subtalar motion. The Achilles and peroneal nontender to palpation.  Imaging: No results found. No images are attached to the encounter.  Labs: Lab Results  Component Value Date   HGBA1C 7.1 (H) 06/03/2015   HGBA1C 8.7 (H) 04/12/2014   HGBA1C 10.4 (H) 06/05/2013    Orders:  No orders of the defined types were placed in this encounter.  No orders of the defined types were placed in this encounter.    Procedures: No procedures performed  Clinical Data: No additional findings.  ROS:  All other systems negative, except as noted in the HPI. Review of Systems  Objective: Vital Signs: There were no vitals taken for this visit.  Specialty Comments:  No specialty comments  available.  PMFS History: Patient Active Problem List   Diagnosis Date Noted  . Posterior tibial tendinitis, right leg 10/07/2016  . Vitamin D deficiency 01/07/2016  . Hypertension 06/03/2015  . Diabetes mellitus type 1, uncontrolled ([place]) 06/01/2014  . DKA, type 1 ([place]) 05/20/2014  . Depression 05/20/2014  . AKI (acute kidney injury) ([place]) 05/20/2014  . Dyslipidemia 04/12/2014  . Leukocytosis 07/28/2013  . Fibromyalgia 05/27/2011  . Essential hypertension 12/15/2006  . INSOMNIA 12/15/2006  . HEADACHE 12/15/2006  . CHEST PAIN 12/15/2006  . COLONIC POLYPS, HX OF 12/15/2006   Past Medical History:  Diagnosis Date  . Depression   . Fibromyalgia   . Headache(784.0)   . History of colon polyps   . History of diabetes with ketoacidosis   . Hx of colonic polyps   . Hypertension   . Insomnia   . Insulin pump in place   . RBBB (right bundle branch block)   . RLQ abdominal pain   . Thickened endometrium   . Type 1 diabetes mellitus with long-term current use of insulin [facility]) endocrinologist-- dr Chalmers Cater   July 2015--  pt took Invokana and caused complete pancreas shut-down , pt went from pre-diabetic to adult type 1 dm now insulin dependent  . Vertigo     Family History  Problem Relation Age of Onset  . Alcohol abuse Father  Died age 56  . Heart disease Father   . Aneurysm Mother        Died age 15  . Leukemia Brother        Died age 34  . COPD Sister   . Rheum arthritis Sister   . Heart failure Sister   . Colon cancer Neg Hx     Past Surgical History:  Procedure Laterality Date  . CARDIOVASCULAR STRESS TEST  07/26/2015   normal myocardial perfusion study w/ no ischemia/  normal LV function and wall motion , ef 71%  . COLONOSCOPY WITH PROPOFOL  last one 10/ 2017  . HYSTEROSCOPY W/D&C N/A 12/25/2015   Procedure: DILATATION AND CURETTAGE /HYSTEROSCOPY;  Surgeon: Bobbye Charleston, MD;  Location[facility];  Service: Gynecology;  Laterality: N/A;  .  LAPAROSCOPIC LYSIS OF ADHESIONS N/A 12/25/2015   Procedure: LAPAROSCOPIC LYSIS OF ADHESIONS;  Surgeon: Bobbye Charleston, MD;  Location: [place];  Service: Gynecology;  Laterality: N/A;  . LAPAROSCOPIC UNILATERAL SALPINGO OOPHERECTOMY Right 12/25/2015   Procedure: LAPAROSCOPIC UNILATERAL SALPINGO OOPHORECTOMY;  Surgeon: Bobbye Charleston, MD;  Location: [place];  Service: Gynecology;  Laterality: Right;  . LAPAROSCOPY N/A 12/25/2015   Procedure: LAPAROSCOPY OPERATIVE;  Surgeon: Bobbye Charleston, MD;  Location: [facility];  Service: Gynecology;  Laterality: N/A;  . TONSILLECTOMY  age 66  . TRANSTHORACIC ECHOCARDIOGRAM  07/26/2015   grade 1 diastolic dysfunction , ef 60-65%/  trivial TR   Social History   Occupational History  . Life insurance agent    Social History Main Topics  . Smoking status: Never Smoker  . Smokeless tobacco: Never Used  . Alcohol use No  . Drug use: No  . Sexual activity: No

## 2016-10-09 ENCOUNTER — Ambulatory Visit: Payer: Medicare Other | Admitting: Family Medicine

## 2016-10-10 ENCOUNTER — Other Ambulatory Visit: Payer: Self-pay | Admitting: Family Medicine

## 2016-10-12 ENCOUNTER — Ambulatory Visit: Payer: Medicare Other | Admitting: Nurse Practitioner

## 2016-10-12 NOTE — Progress Notes (Deleted)
CARDIOLOGY OFFICE NOTE  Date:  10/12/2016    Katie Palmer Date of Birth: 11/20/1950 Medical Record #[id number]  PCP:  Laurey Morale, MD  Cardiologist:  Servando Snare & ***    No chief complaint on file.   History of Present Illness: Katie Palmer is a 66 y.o. female who presents today for a ***   Comes in today. Here with   Past Medical History:  Diagnosis Date  . Depression   . Fibromyalgia   . Headache(784.0)   . History of colon polyps   . History of diabetes with ketoacidosis   . Hx of colonic polyps   . Hypertension   . Insomnia   . Insulin pump in place   . RBBB (right bundle branch block)   . RLQ abdominal pain   . Thickened endometrium   . Type 1 diabetes mellitus with long-term current use of insulin [facility]) endocrinologist-- dr Chalmers Cater   July 2015--  pt took Invokana and caused complete pancreas shut-down , pt went from pre-diabetic to adult type 1 dm now insulin dependent  . Vertigo     Past Surgical History:  Procedure Laterality Date  . CARDIOVASCULAR STRESS TEST  07/26/2015   normal myocardial perfusion study w/ no ischemia/  normal LV function and wall motion , ef 71%  . COLONOSCOPY WITH PROPOFOL  last one 10/ 2017  . HYSTEROSCOPY W/D&C N/A 12/25/2015   Procedure: DILATATION AND CURETTAGE /HYSTEROSCOPY;  Surgeon: Bobbye Charleston, MD;  Location: [facility];  Service: Gynecology;  Laterality: N/A;  . LAPAROSCOPIC LYSIS OF ADHESIONS N/A 12/25/2015   Procedure: LAPAROSCOPIC LYSIS OF ADHESIONS;  Surgeon: Bobbye Charleston, MD;  Location: [place];  Service: Gynecology;  Laterality: N/A;  . LAPAROSCOPIC UNILATERAL SALPINGO OOPHERECTOMY Right 12/25/2015   Procedure: LAPAROSCOPIC UNILATERAL SALPINGO OOPHORECTOMY;  Surgeon: Bobbye Charleston, MD;  Location: [place];  Service: Gynecology;  Laterality: Right;  . LAPAROSCOPY N/A 12/25/2015   Procedure: LAPAROSCOPY OPERATIVE;  Surgeon: Bobbye Charleston, MD;   Location: [facility];  Service: Gynecology;  Laterality: N/A;  . TONSILLECTOMY  age 52  . TRANSTHORACIC ECHOCARDIOGRAM  07/26/2015   grade 1 diastolic dysfunction , ef 60-65%/  trivial TR     Medications: No outpatient prescriptions have been marked as taking for the 10/12/16 encounter (Appointment) with Burtis Junes, NP.     Allergies: Allergies  Allergen Reactions  . Invokana [Canagliflozin] Other (See Comments)    "shut down pancreas"   . Lasix [Furosemide] Diarrhea    Severe diarrhea    Social History: The patient  reports that she has never smoked. She has never used smokeless tobacco. She reports that she does not drink alcohol or use drugs.   Family History: The patient's ***family history includes Alcohol abuse in her father; Aneurysm in her mother; COPD in her sister; Heart disease in her father; Heart failure in her sister; Leukemia in her brother; Rheum arthritis in her sister.   Review of Systems: Please see the history of present illness.   Otherwise, the review of systems is positive for {NONE DEFAULTED:18576::"none"}.   All other systems are reviewed and negative.   Physical Exam: VS:  There were no vitals taken for this visit. Marland Kitchen  BMI There is no height or weight on file to calculate BMI.  Wt Readings from Last 3 Encounters:  02/20/16 216 lb (98 kg)  01/09/16 216 lb (98 kg)  01/07/16 216 lb 4 oz (98.1 kg)  General: Pleasant. Well developed, well nourished and in no acute distress.   HEENT: Normal.  Neck: Supple, no JVD, carotid bruits, or masses noted.  Cardiac: ***Regular rate and rhythm. No murmurs, rubs, or gallops. No edema.  Respiratory:  Lungs are clear to auscultation bilaterally with normal work of breathing.  GI: Soft and nontender.  MS: No deformity or atrophy. Gait and ROM intact.  Skin: Warm and dry. Color is normal.  Neuro:  Strength and sensation are intact and no gross focal deficits noted.  Psych: Alert, appropriate  and with normal affect.   LABORATORY DATA:  EKG:  EKG {ACTION; IS/IS DJS:[id number]} ordered today. This demonstrates ***.  Lab Results  Component Value Date   WBC 7.6 06/03/2015   HGB 13.9 12/25/2015   HCT 41.0 12/25/2015   PLT 204.0 06/03/2015   GLUCOSE 218 (H) 12/25/2015   CHOL 174 06/03/2015   TRIG 63.0 06/03/2015   HDL 84.00 06/03/2015   LDLDIRECT 145.5 02/04/2009   LDLCALC 77 06/03/2015   ALT 15 06/03/2015   AST 18 06/03/2015   NA 141 12/25/2015   K 3.4 (L) 12/25/2015   CL 103 12/25/2015   CREATININE 0.80 12/25/2015   BUN 13 12/25/2015   CO2 31 06/03/2015   TSH 2.47 06/03/2015   HGBA1C 7.1 (H) 06/03/2015     BNP (last 3 results) No results for input(s): BNP in the last 8760 hours.  ProBNP (last 3 results) No results for input(s): PROBNP in the last 8760 hours.   Other Studies Reviewed Today:   Assessment/Plan:   Current medicines are reviewed with the patient today.  The patient does not have concerns regarding medicines other than what has been noted above.  The following changes have been made:  See above.  Labs/ tests ordered today include:   No orders of the defined types were placed in this encounter.    Disposition:   FU with *** in {gen number 5-88:[id number]} {Days to years:10300}.   Patient is agreeable to this plan and will call if any problems develop in the interim.   SignedTruitt Merle, NP  10/12/2016 7:29 AM  [place] [address] [place], [place]  [address] Phone: [phone] Fax: [phone]

## 2016-10-15 ENCOUNTER — Encounter: Payer: Self-pay | Admitting: Family Medicine

## 2016-10-21 ENCOUNTER — Ambulatory Visit: Payer: Medicare Other | Admitting: *Deleted

## 2016-10-29 ENCOUNTER — Encounter: Payer: Self-pay | Admitting: Neurology

## 2016-10-29 ENCOUNTER — Encounter: Payer: Self-pay | Admitting: Family Medicine

## 2016-10-29 ENCOUNTER — Ambulatory Visit (INDEPENDENT_AMBULATORY_CARE_PROVIDER_SITE_OTHER): Payer: Medicare Other | Admitting: Family Medicine

## 2016-10-29 VITALS — BP 102/84 | HR 74 | Temp 98.4°F | Ht 65.0 in | Wt 228.0 lb

## 2016-10-29 DIAGNOSIS — F329 Major depressive disorder, single episode, unspecified: Secondary | ICD-10-CM

## 2016-10-29 DIAGNOSIS — E559 Vitamin D deficiency, unspecified: Secondary | ICD-10-CM

## 2016-10-29 DIAGNOSIS — M797 Fibromyalgia: Secondary | ICD-10-CM

## 2016-10-29 DIAGNOSIS — I10 Essential (primary) hypertension: Secondary | ICD-10-CM | POA: Diagnosis not present

## 2016-10-29 DIAGNOSIS — E785 Hyperlipidemia, unspecified: Secondary | ICD-10-CM | POA: Diagnosis not present

## 2016-10-29 DIAGNOSIS — E10649 Type 1 diabetes mellitus with hypoglycemia without coma: Secondary | ICD-10-CM | POA: Diagnosis not present

## 2016-10-29 DIAGNOSIS — Z23 Encounter for immunization: Secondary | ICD-10-CM | POA: Diagnosis not present

## 2016-10-29 DIAGNOSIS — R531 Weakness: Secondary | ICD-10-CM | POA: Diagnosis not present

## 2016-10-29 DIAGNOSIS — F5101 Primary insomnia: Secondary | ICD-10-CM | POA: Diagnosis not present

## 2016-10-29 DIAGNOSIS — R42 Dizziness and giddiness: Secondary | ICD-10-CM | POA: Diagnosis not present

## 2016-10-29 DIAGNOSIS — R413 Other amnesia: Secondary | ICD-10-CM | POA: Diagnosis not present

## 2016-10-29 LAB — BASIC METABOLIC PANEL
BUN: 18 mg/dL (ref 6–23)
CHLORIDE: 102 meq/L (ref 96–112)
CO2: 32 mEq/L (ref 19–32)
CREATININE: 0.91 mg/dL (ref 0.40–1.20)
Calcium: 8.9 mg/dL (ref 8.4–10.5)
GFR: 65.34 mL/min (ref >60.00)
Glucose, Bld: 160 mg/dL — ABNORMAL HIGH (ref 70–99)
POTASSIUM: 4.4 meq/L (ref 3.5–5.1)
Sodium: 141 mEq/L (ref 135–145)

## 2016-10-29 LAB — POC URINALSYSI DIPSTICK (AUTOMATED)
Blood, UA: NEGATIVE
CLARITY UA: NEGATIVE
Glucose, UA: NEGATIVE
Leukocytes, UA: NEGATIVE
NITRITE UA: NEGATIVE
PH UA: 5 (ref 5.0–8.0)
Spec Grav, UA: >=1.03 — AB (ref 1.010–1.025)
Urobilinogen, UA: 0.2 E.U./dL

## 2016-10-29 LAB — CBC WITH DIFFERENTIAL/PLATELET
BASOS ABS: 0 10*3/uL (ref 0.0–0.1)
Basophils Relative: 0.6 % (ref 0.0–3.0)
EOS PCT: 2.5 % (ref 0.0–5.0)
Eosinophils Absolute: 0.2 10*3/uL (ref 0.0–0.7)
HEMATOCRIT: 40.3 % (ref 36.0–46.0)
HEMOGLOBIN: 13.6 g/dL (ref 12.0–15.0)
LYMPHS ABS: 1.5 10*3/uL (ref 0.7–4.0)
Lymphocytes Relative: 23.9 % (ref 12.0–46.0)
MCHC: 33.9 g/dL (ref 30.0–36.0)
MCV: 89 fl (ref 78.0–100.0)
MONOS PCT: 8.4 % (ref 3.0–12.0)
Monocytes Absolute: 0.5 10*3/uL (ref 0.1–1.0)
NEUTROS PCT: 64.6 % (ref 43.0–77.0)
Neutro Abs: 4.1 10*3/uL (ref 1.4–7.7)
PLATELETS: 215 10*3/uL (ref 150.0–400.0)
RBC: 4.52 Mil/uL (ref 3.87–5.11)
RDW: 13.3 % (ref 11.5–15.5)
WBC: 6.3 10*3/uL (ref 4.0–10.5)

## 2016-10-29 LAB — LIPID PANEL
CHOL/HDL RATIO: 3
Cholesterol: 186 mg/dL (ref 0–200)
HDL: 72 mg/dL (ref >39.00)
LDL CALC: 98 mg/dL (ref 0–99)
NONHDL: 114.01
Triglycerides: 80 mg/dL (ref 0.0–149.0)
VLDL: 16 mg/dL (ref 0.0–40.0)

## 2016-10-29 LAB — HEPATIC FUNCTION PANEL
ALT: 19 U/L (ref 0–35)
AST: 20 U/L (ref 0–37)
Albumin: 3.6 g/dL (ref 3.5–5.2)
Alkaline Phosphatase: 88 U/L (ref 39–117)
BILIRUBIN DIRECT: 0.1 mg/dL (ref 0.0–0.3)
Total Bilirubin: 0.4 mg/dL (ref 0.2–1.2)
Total Protein: 6.2 g/dL (ref 6.0–8.3)

## 2016-10-29 LAB — TSH: TSH: 2.35 u[IU]/mL (ref 0.35–4.50)

## 2016-10-29 NOTE — Progress Notes (Signed)
   Subjective:    Patient ID: Katie Palmer, female    DOB: 05-10-1950, 66 y.o.   MRN: [id number]  HPI Here to follow up several issues. First she asks for help in evaluating memory loss. She describes increasing trouble with finishing sentences, forgetting why she went into a certain store, etc. She is worried about early dementia. Also shehas had intermittent vertigo for years but this has become more constant over the past few months. She gets dizzy and sweaty and feels like she may fall. Otherwise her depression is stable and she still sees Noemi Chapel NP for this. She sees Dr. Chalmers Cater for her diabetes.   Review of Systems  Constitutional: Negative.   HENT: Negative.   Eyes: Negative.   Respiratory: Negative.   Cardiovascular: Negative.   Gastrointestinal: Negative.   Genitourinary: Negative for decreased urine volume, difficulty urinating, dyspareunia, dysuria, enuresis, flank pain, frequency, hematuria, pelvic pain and urgency.  Musculoskeletal: Negative.   Skin: Negative.   Neurological: Positive for dizziness and light-headedness. Negative for tremors, seizures, syncope, facial asymmetry, speech difficulty, weakness, numbness and headaches.  Psychiatric/Behavioral: Negative.        Objective:   Physical Exam  Constitutional: She is oriented to person, place, and time. She appears well-developed and well-nourished. No distress.  HENT:  Head: Normocephalic and atraumatic.  Right Ear: External ear normal.  Left Ear: External ear normal.  Nose: Nose normal.  Mouth/Throat: Oropharynx is clear and moist. No oropharyngeal exudate.  Eyes: Pupils are equal, round, and reactive to light. Conjunctivae and EOM are normal. No scleral icterus.  Neck: Normal range of motion. Neck supple. No JVD present. No thyromegaly present.  Cardiovascular: Normal rate, regular rhythm, normal heart sounds and intact distal pulses.  Exam reveals no gallop and no friction rub.   No murmur  heard. Pulmonary/Chest: Effort normal and breath sounds normal. No respiratory distress. She has no wheezes. She has no rales. She exhibits no tenderness.  Abdominal: Soft. Bowel sounds are normal. She exhibits no distension and no mass. There is no tenderness. There is no rebound and no guarding.  Musculoskeletal: Normal range of motion. She exhibits no edema or tenderness.  Lymphadenopathy:    She has no cervical adenopathy.  Neurological: She is alert and oriented to person, place, and time. She has normal reflexes. No cranial nerve deficit. She exhibits normal muscle tone. Coordination normal.  Skin: Skin is warm and dry. No rash noted. No erythema.  Psychiatric: She has a normal mood and affect. Her behavior is normal. Judgment and thought content normal.          Assessment & Plan:  Her depression and DM are stable. She has chronic vertigo and we will refer her for vestibular rehab. She can also start taking a Claritin daily. Refer to Neurology for memory loss.  Alysia Penna, MD

## 2016-10-29 NOTE — Patient Instructions (Signed)
WE NOW OFFER   [facility] FAST TRACK!!!  SAME DAY Appointments for ACUTE CARE  Such as: Sprains, Injuries, cuts, abrasions, rashes, muscle pain, joint pain, back pain Colds, flu, sore throats, headache, allergies, cough, fever  Ear pain, sinus and eye infections Abdominal pain, nausea, vomiting, diarrhea, upset stomach Animal/insect bites  3 Easy Ways to Schedule: Walk-In Scheduling Call in scheduling Mychart Sign-up: [email or web address]         

## 2016-10-30 LAB — VITAMIN B12: Vitamin B-12: 537 pg/mL (ref 211–911)

## 2016-10-30 NOTE — Addendum Note (Signed)
Addended by: Tomi Likens on: 10/30/2016 10:07 AM   Modules accepted: Orders

## 2016-11-03 ENCOUNTER — Encounter: Payer: Self-pay | Admitting: Family Medicine

## 2016-11-04 ENCOUNTER — Telehealth: Payer: Self-pay | Admitting: Family Medicine

## 2016-11-04 ENCOUNTER — Ambulatory Visit (INDEPENDENT_AMBULATORY_CARE_PROVIDER_SITE_OTHER): Payer: Medicare Other | Admitting: Orthopedic Surgery

## 2016-11-04 NOTE — Telephone Encounter (Signed)
Received PA request for Tramadol. Pa submitted & is pending. Key: [id number]

## 2016-11-04 NOTE — Telephone Encounter (Signed)
The prior authorization has been approved. The form has been faxed back to the pharmacy letting them know, and sent to be scanned into patient's chart.

## 2016-11-10 ENCOUNTER — Encounter: Payer: Medicare Other | Attending: Family Medicine | Admitting: *Deleted

## 2016-11-10 DIAGNOSIS — Z713 Dietary counseling and surveillance: Secondary | ICD-10-CM | POA: Insufficient documentation

## 2016-11-10 DIAGNOSIS — E108 Type 1 diabetes mellitus with unspecified complications: Secondary | ICD-10-CM | POA: Insufficient documentation

## 2016-11-10 DIAGNOSIS — E1065 Type 1 diabetes mellitus with hyperglycemia: Secondary | ICD-10-CM | POA: Diagnosis not present

## 2016-11-10 DIAGNOSIS — E10649 Type 1 diabetes mellitus with hypoglycemia without coma: Secondary | ICD-10-CM

## 2016-11-13 NOTE — Progress Notes (Signed)
Diabetes Self-Management Education  Visit Type:  Follow-up  Appt. Start Time: 1400 Appt. End Time: 1430  11/13/2016  [name], identified by name and date of birth, is a 66 y.o. female with a diagnosis of Diabetes:  .  She is here for ongoing education for her diabetes. She states her stress level continues to be high and it makes it hard to "do what she is supposed to do" for her diabetes. Her son continues to live with her and is not supportive in any way. She had injured her foot so cannot walk as she used to. She would like to sell her house and down size but that will require a lot of work cleaning out extra stuff and getting house ready to [place] the market.   ASSESSMENT  There were no vitals taken for this visit. There is no height or weight on file to calculate BMI.       Diabetes Self-Management Education - 11/10/16 1405      Psychosocial Assessment   Patient Belief/Attitude about Diabetes Other (comment)  lots of stresses in life in general   Self-management support CDE visits   Patient Concerns Nutrition/Meal planning;Glycemic Control   Special Needs None   Learning Readiness Contemplating     Complications   How often do you check your blood sugar? 3-4 times/day     Patient Education   Previous Diabetes Education Yes (please comment)   Psychosocial adjustment Worked with patient to identify barriers to care and solutions;Helped patient identify a support system for diabetes management;Identified and addressed patients feelings and concerns about diabetes;Brainstormed with patient on coping mechanisms for social situations, getting support from significant others, dealing with feelings about diabetes     Individualized Goals (developed by patient)   Health Coping Other (comment)     Post-Education Assessment   Patient understands the diabetes disease and treatment process. Demonstrates understanding / competency   Patient understands incorporating  nutritional management into lifestyle. Demonstrates understanding / competency   Patient understands using medications safely. Demonstrates understanding / competency   Patient understands monitoring blood glucose, interpreting and using results Demonstrates understanding / competency   Patient understands how to develop strategies to address psychosocial issues. Demonstrates understanding / competency     Outcomes   Program Status Not Completed     Subsequent Visit   Since your last visit have you continued or begun to take your medications as prescribed? Yes   Since your last visit have you experienced any weight changes? Gain   Since your last visit, are you checking your blood glucose at least once a day? Yes     Learning Objective:  Patient will have a greater understanding of diabetes self-management. Patient education plan is to attend individual and/or group sessions per assessed needs and concerns.  Plan:   Patient Instructions  We discussed the emotional and stress impact of your life today and reviewed some suggestions to help with those:  Write down what your goals to accomplish are and what barriers get in the way of accomplishing them so you can see them on paper and be able to prioritize better  Try to stop using judgmental or negative words. Consider replacing them with action words:  Replace "good', 'bad", "right", "wrong" or "excuse" with "appropriate" or "reason"  Give yourself credit for the things you can be proud of]  Remember there is no such thing as "perfection" so if you expect it, you will be disappointed  Concentrate on  the behaviors that will result in a positive direction for your health, not the numbers such as pounds of weight. The behaviors will result in an increase in your fitness level  We also discussed options for increasing your activity level, you expressed interest in Yoga and know of places to inquire locally. We also discussed Arm Chair  Exercises you can do at home and can find them on You Tube Videos Consider attending the DM 1 / Pump Support Group when able especially for the camaraderie with others who wear pumps.   Expected Outcomes:  Demonstrated interest in learning. Expect positive outcomes  Education material provided: No set handouts, just hand written notes regarding our conversation today.  If problems or questions, patient to contact team via:  Phone  Future DSME appointment: - 4-6 wks

## 2016-11-13 NOTE — Patient Instructions (Addendum)
We discussed the emotional and stress impact of your life today and reviewed some suggestions to help with those:  Write down what your goals to accomplish are and what barriers get in the way of accomplishing them so you can see them on paper and be able to prioritize better  Try to stop using judgmental or negative words. Consider replacing them with action words:  Replace "good', 'bad", "right", "wrong" or "excuse" with "appropriate" or "reason"  Give yourself credit for the things you can be proud of]  Remember there is no such thing as "perfection" so if you expect it, you will be disappointed  Concentrate on the behaviors that will result in a positive direction for your health, not the numbers such as pounds of weight. The behaviors will result in an increase in your fitness level  We also discussed options for increasing your activity level, you expressed interest in Yoga and know of places to inquire locally. We also discussed Arm Chair Exercises you can do at home and can find them on You Tube Videos  Consider attending the DM 1 / Pump Support Group when able especially for the camaraderie with others who wear pumps.

## 2016-11-16 ENCOUNTER — Ambulatory Visit (INDEPENDENT_AMBULATORY_CARE_PROVIDER_SITE_OTHER): Payer: Medicare Other | Admitting: Nurse Practitioner

## 2016-11-16 ENCOUNTER — Encounter: Payer: Self-pay | Admitting: Nurse Practitioner

## 2016-11-16 VITALS — BP 118/64 | HR 77 | Ht 65.0 in | Wt 227.8 lb

## 2016-11-16 DIAGNOSIS — R9431 Abnormal electrocardiogram [ECG] [EKG]: Secondary | ICD-10-CM | POA: Diagnosis not present

## 2016-11-16 DIAGNOSIS — R0602 Shortness of breath: Secondary | ICD-10-CM

## 2016-11-16 DIAGNOSIS — I4510 Unspecified right bundle-branch block: Secondary | ICD-10-CM

## 2016-11-16 DIAGNOSIS — E7849 Other hyperlipidemia: Secondary | ICD-10-CM

## 2016-11-16 DIAGNOSIS — I10 Essential (primary) hypertension: Secondary | ICD-10-CM

## 2016-11-16 DIAGNOSIS — Z8249 Family history of ischemic heart disease and other diseases of the circulatory system: Secondary | ICD-10-CM

## 2016-11-16 NOTE — Progress Notes (Signed)
CARDIOLOGY OFFICE NOTE  Date:  11/16/2016    Katie Palmer Date of Birth: 1950/02/08 Medical Record #[id number]  PCP:  Laurey Morale, MD  Cardiologist:  Johnsie Cancel    Chief Complaint  Patient presents with  . Hypertension  . Hyperlipidemia  . Abnormal ECG    Follow up visit - seen for Dr. Johnsie Cancel    History of Present Illness: Katie Palmer is a 66 y.o. female who presents today for a 16 month check - seen for Dr. Johnsie Cancel.   She has chronically abnormal EKG with RBBB - negative Myoview and stable echo from 2017. Other issues include HTN, HLD, fibromyalgia, and IDDM.   Seen back in June of 2017 - felt to be doing ok.   Comes in today. Here alone. She says she is doing well. But has gained all of her weight back. Feels "fat and depressed". Not exercising due to a foot injury - says everyone tells her to do water program. No chest pain. Breathing is ok. Not dizzy or lightheaded. No syncope. Has lots of prn medicines but really not taking much of any of them. Does wish she could get off some medicines. She notes her mother died with an AAA and is wishing to have her checked.   Past Medical History:  Diagnosis Date  . Depression   . Fibromyalgia   . Headache(784.0)   . History of colon polyps   . History of diabetes with ketoacidosis   . Hx of colonic polyps   . Hypertension   . Insomnia   . Insulin pump in place   . RBBB (right bundle branch block)   . RLQ abdominal pain   . Thickened endometrium   . Type 1 diabetes mellitus with long-term current use of insulin [facility]) endocrinologist-- dr Chalmers Cater   July 2015--  pt took Invokana and caused complete pancreas shut-down , pt went from pre-diabetic to adult type 1 dm now insulin dependent  . Vertigo     Past Surgical History:  Procedure Laterality Date  . CARDIOVASCULAR STRESS TEST  07/26/2015   normal myocardial perfusion study w/ no ischemia/  normal LV function and wall motion , ef 71%  . COLONOSCOPY WITH PROPOFOL  last  one 10/ 2017  . HYSTEROSCOPY W/D&C N/A 12/25/2015   Procedure: DILATATION AND CURETTAGE /HYSTEROSCOPY;  Surgeon: Bobbye Charleston, MD;  Location: [facility];  Service: Gynecology;  Laterality: N/A;  . LAPAROSCOPIC LYSIS OF ADHESIONS N/A 12/25/2015   Procedure: LAPAROSCOPIC LYSIS OF ADHESIONS;  Surgeon: Bobbye Charleston, MD;  Location: [place];  Service: Gynecology;  Laterality: N/A;  . LAPAROSCOPIC UNILATERAL SALPINGO OOPHERECTOMY Right 12/25/2015   Procedure: LAPAROSCOPIC UNILATERAL SALPINGO OOPHORECTOMY;  Surgeon: Bobbye Charleston, MD;  Location: [place];  Service: Gynecology;  Laterality: Right;  . LAPAROSCOPY N/A 12/25/2015   Procedure: LAPAROSCOPY OPERATIVE;  Surgeon: Bobbye Charleston, MD;  Location: [facility];  Service: Gynecology;  Laterality: N/A;  . TONSILLECTOMY  age 33  . TRANSTHORACIC ECHOCARDIOGRAM  07/26/2015   grade 1 diastolic dysfunction , ef 60-65%/  trivial TR     Medications: Current Meds  Medication Sig  . ALPRAZolam (XANAX) 1 MG tablet TAKE 1/2 TO 1 TABLET BY MOUTH AT BEDTIME AS NEEDED FOR ANXIETY  . DULoxetine (CYMBALTA) 30 MG capsule Take 90 capsules by mouth every evening.   . ezetimibe (ZETIA) 10 MG tablet Take 10 mg by mouth daily.  Marland Kitchen gabapentin (NEURONTIN) 100 MG capsule Take 1  capsule (100 mg total) by mouth daily. (Patient taking differently: Take 100-200 mg by mouth daily. )  . gabapentin (NEURONTIN) 300 MG capsule Take 300 mg by mouth at bedtime.  Marland Kitchen GLUCAGON EMERGENCY 1 MG injection Reported on 06/03/2015  . losartan-hydrochlorothiazide (HYZAAR) 50-12.5 MG tablet TAKE 1 TABLET BY MOUTH EVERY DAY  . meclizine (ANTIVERT) 25 MG tablet TAKE 1 TABLET (25 MG TOTAL) BY MOUTH EVERY 4 (FOUR) HOURS AS NEEDED FOR DIZZINESS.  Marland Kitchen NOVOLOG 100 UNIT/ML injection UNITS BY PUMP DEPENDS ON SUGAR READINGS, SLIDING SCALE  . SUMAtriptan (IMITREX) 100 MG tablet TAKE 1 TABLET BY MOUTH AS NEEDED FOR MIGRAINES. MAY  REPEAT IN 2 HOURS IF NEEDED  . traMADol (ULTRAM) 50 MG tablet TAKE 2 TABLETS BY MOUTH EVERY 4 HOURS AS NEEDED.  Marland Kitchen traZODone (DESYREL) 100 MG tablet Take 1 tablet by mouth at bedtime.  . Vitamin D, Ergocalciferol, (DRISDOL) 50000 units CAPS capsule TAKE 1 CAPSULE (50,000 UNITS TOTAL) BY MOUTH EVERY 7 (SEVEN) DAYS.     Allergies: Allergies  Allergen Reactions  . Armodafinil     Flu-like symptoms  . Invokana [Canagliflozin] Other (See Comments)    "shut down pancreas"   . Lasix [Furosemide] Diarrhea    Severe diarrhea    Social History: The patient  reports that she has never smoked. She has never used smokeless tobacco. She reports that she does not drink alcohol or use drugs.   Family History: The patient's family history includes Alcohol abuse in her father; Aneurysm in her mother; COPD in her sister; Heart disease in her father; Heart failure in her sister; Leukemia in her brother; Rheum arthritis in her sister.   Review of Systems: Please see the history of present illness.   Otherwise, the review of systems is positive for none.   All other systems are reviewed and negative.   Physical Exam: VS:  BP 118/64   Pulse 77   Ht 5\' 5"  (1.651 m)   Wt 227 lb 12 oz (103.3 kg)   SpO2 (!) 76%   BMI 37.90 kg/m  .  BMI Body mass index is 37.9 kg/m.  Wt Readings from Last 3 Encounters:  11/16/16 227 lb 12 oz (103.3 kg)  10/29/16 228 lb (103.4 kg)  02/20/16 216 lb (98 kg)    General: Pleasant. Obese. Alert and in no acute distress.   HEENT: Normal.  Neck: Supple, no JVD, carotid bruits, or masses noted.  Cardiac: Regular rate and rhythm. Heart tones are distant. No edema.  Respiratory:  Lungs are clear to auscultation bilaterally with normal work of breathing.  GI: Soft and nontender. Obese. Not able to appreciate an aneurysm on exam.  MS: No deformity or atrophy. Gait and ROM intact.  Skin: Warm and dry. Color is normal.  Neuro:  Strength and sensation are intact and no gross  focal deficits noted.  Psych: Alert, appropriate and with normal affect.   LABORATORY DATA:  EKG:  EKG is ordered today. This demonstrates NSR with RBBB, inferior infarct and anterior lateral infarct - unchanged.  Lab Results  Component Value Date   WBC 6.3 10/29/2016   HGB 13.6 10/29/2016   HCT 40.3 10/29/2016   PLT 215.0 10/29/2016   GLUCOSE 160 (H) 10/29/2016   CHOL 186 10/29/2016   TRIG 80.0 10/29/2016   HDL 72.00 10/29/2016   LDLDIRECT 145.5 02/04/2009   LDLCALC 98 10/29/2016   ALT 19 10/29/2016   AST 20 10/29/2016   NA 141 10/29/2016   K 4.4 10/29/2016  CL 102 10/29/2016   CREATININE 0.91 10/29/2016   BUN 18 10/29/2016   CO2 32 10/29/2016   TSH 2.35 10/29/2016   HGBA1C 7.1 (H) 06/03/2015     BNP (last 3 results) No results for input(s): BNP in the last 8760 hours.  ProBNP (last 3 results) No results for input(s): PROBNP in the last 8760 hours.   Other Studies Reviewed Today:  Myoview Study Highlights 06/2015   The Patient waled for 8:31 of a standard Bruce protocol . The patient achieved a peak HR of 146 which is 94% maximal predicted HR.  There was no ST segment deviation noted during stress.  The study is normal. There is no ischemia or evidence of infarction  This is a low risk study.  The left ventricular ejection fraction is hyperdynamic (>65%).   Echo Study Conclusions 06/2015  - Left ventricle: The cavity size was normal. Wall thickness was   normal. Systolic function was normal. The estimated ejection   fraction was in the range of 60% to 65%. Wall motion was normal;   there were no regional wall motion abnormalities. Doppler   parameters are consistent with abnormal left ventricular   relaxation (grade 1 diastolic dysfunction). - Pericardium, extracardiac: A trivial pericardial effusion was   identified.  Impressions:  - Normal LV systolic function; grade 1 diastolic dysfunction;   sclerotic aortic valve.  Assessment/Plan:  1.  Chronic RBBB/abnormal EKG - no symptoms noted. Has had low risk Myoview and stable echo from 2017. Would favor medical management with CV risk factor modification.   2. HTN - BP looks good on her current regimen.   3. FH for AAA - will get duplex. Body habitus difficult for physical assessment.   4. DM  5. Obesity - discussed at length - she is considering starting a water program. Encouragement given.   Current medicines are reviewed with the patient today.  The patient does not have concerns regarding medicines other than what has been noted above.  The following changes have been made:  See above.  Labs/ tests ordered today include:   No orders of the defined types were placed in this encounter.    Disposition:   FU with Dr. Johnsie Cancel in one year.    Patient is agreeable to this plan and will call if any problems develop in the interim.   SignedTruitt Merle, NP  11/16/2016 2:24 PM  [facility] [address] [place], [place]  [address] Phone: [phone] Fax: [phone]

## 2016-11-16 NOTE — Patient Instructions (Addendum)
We will be checking the following labs today - NONE  Medication Instructions:    Continue with your current medicines.     Testing/Procedures To Be Arranged:  Will arrange for AAA duplex at [facility]  Follow-Up:   See Dr. Johnsie Cancel in one year    Other Special Instructions:   N/A    If you need a refill on your cardiac medications before your next appointment, please call your pharmacy.   Call the [place] office at [phone] if you have any questions, problems or concerns.

## 2016-11-24 ENCOUNTER — Ambulatory Visit: Payer: Medicare Other | Admitting: *Deleted

## 2016-12-04 ENCOUNTER — Ambulatory Visit (HOSPITAL_COMMUNITY)
Admission: RE | Admit: 2016-12-04 | Payer: Medicare Other | Source: Ambulatory Visit | Attending: Nurse Practitioner | Admitting: Nurse Practitioner

## 2016-12-10 ENCOUNTER — Ambulatory Visit (HOSPITAL_COMMUNITY): Payer: Medicare Other | Attending: Cardiovascular Disease

## 2016-12-22 ENCOUNTER — Encounter: Payer: Medicare Other | Attending: Family Medicine | Admitting: *Deleted

## 2016-12-22 DIAGNOSIS — E1010 Type 1 diabetes mellitus with ketoacidosis without coma: Secondary | ICD-10-CM

## 2016-12-22 DIAGNOSIS — E108 Type 1 diabetes mellitus with unspecified complications: Secondary | ICD-10-CM | POA: Diagnosis not present

## 2016-12-22 DIAGNOSIS — E1065 Type 1 diabetes mellitus with hyperglycemia: Secondary | ICD-10-CM | POA: Insufficient documentation

## 2016-12-22 DIAGNOSIS — Z713 Dietary counseling and surveillance: Secondary | ICD-10-CM | POA: Insufficient documentation

## 2016-12-29 NOTE — Patient Instructions (Signed)
Patient Instructions  We reviewed the importance of eating a variety of foods from all food groups and discussed putting a meal plan together. In order to do that and help with portion control and weight management, I have asked you to keep a food diary for 1-3 days so we can see more clearly what your habits are.  You have agreed to upload your Tandem insulin pump and bring the reports by for me to review for you.   We reviewed the topics from your last visit:  Discussein the emotional and stress impact of your life today and reviewed some suggestions to help with those:  Write down what your goals to accomplish are and what barriers get in the way of accomplishing them so you can see them on paper and be able to prioritize better  Try to stop using judgmental or negative words. Consider replacing them with action words: ? Replace "good', 'bad", "right", "wrong" or "excuse" with "appropriate" or "reason" ? Give yourself credit for the things you can be proud of]  Remember there is no such thing as "perfection" so if you expect it, you will be disappointed  Concentrate on the behaviors that will result in a positive direction for your health, not the numbers such as pounds of weight. The behaviors will result in an increase in your fitness level  We also discussed options for increasing your activity level, you expressed interest in Yoga and know of places to inquire locally. We also discussed Arm Chair Exercises you can do at home and can find them on You Tube Videos Consider attending the DM 1 / Pump Support Group when able especially for the camaraderie with others who wear pumps.   If you want me to review your insulin pump reports, drop them off to this office in the next few days and we will assess any pump setting adjustments that may need to be made.

## 2016-12-29 NOTE — Progress Notes (Signed)
Diabetes Self-Management Education  Visit Type:     Appt. Start Time: 1400 Appt. End Time: 1430  12/29/2016  [name], identified by name and date of birth, is a 66 y.o. female with a diagnosis of Diabetes:  .  She is here for ongoing education for her diabetes. She states her stress level continues to be high and it makes it hard to "do what she is supposed to do" for her diabetes. Her son continues to live with her and is not supportive in any way. She continues to be concerned about her finances and states she is not sure how long she will be able to stay in her house. ASSESSMENT  There were no vitals taken for this visit. There is no height or weight on file to calculate BMI.    Learning Objective:  Patient will have a greater understanding of diabetes self-management. Patient education plan is to attend individual and/or group sessions per assessed needs and concerns.  Plan:   Patient Instructions  We reviewed the importance of eating a variety of foods from all food groups and discussed putting a meal plan together. In order to do that and help with portion control and weight management, I have asked you to keep a food diary for 1-3 days so we can see more clearly what your habits are.  You have agreed to upload your Tandem insulin pump and bring the reports by for me to review for you.   We reviewed the topics from your last visit:  Discussein the emotional and stress impact of your life today and reviewed some suggestions to help with those:  Write down what your goals to accomplish are and what barriers get in the way of accomplishing them so you can see them on paper and be able to prioritize better  Try to stop using judgmental or negative words. Consider replacing them with action words:  Replace "good', 'bad", "right", "wrong" or "excuse" with "appropriate" or "reason"  Give yourself credit for the things you can be proud of]  Remember there is no such thing as  "perfection" so if you expect it, you will be disappointed  Concentrate on the behaviors that will result in a positive direction for your health, not the numbers such as pounds of weight. The behaviors will result in an increase in your fitness level  We also discussed options for increasing your activity level, you expressed interest in Yoga and know of places to inquire locally. We also discussed Arm Chair Exercises you can do at home and can find them on You Tube Videos Consider attending the DM 1 / Pump Support Group when able especially for the camaraderie with others who wear pumps.   If you want me to review your insulin pump reports, drop them off to this office in the next few days and we will assess any pump setting adjustments that may need to be made.  Expected Outcomes:     Education material provided: No set handouts, just hand written notes regarding our conversation today.  If problems or questions, patient to contact team via:  Phone  Future DSME appointment: -   every 3 months as needed

## 2016-12-30 ENCOUNTER — Other Ambulatory Visit: Payer: Self-pay | Admitting: Family Medicine

## 2016-12-31 NOTE — Telephone Encounter (Signed)
Sent to PCP for approval.  

## 2016-12-31 NOTE — Telephone Encounter (Signed)
Call in #[phone] with 2 rf

## 2017-01-12 ENCOUNTER — Encounter: Payer: Self-pay | Admitting: Neurology

## 2017-01-12 ENCOUNTER — Ambulatory Visit (INDEPENDENT_AMBULATORY_CARE_PROVIDER_SITE_OTHER): Payer: Medicare Other | Admitting: Neurology

## 2017-01-12 VITALS — BP 118/78 | HR 89 | Ht 65.0 in | Wt 229.0 lb

## 2017-01-12 DIAGNOSIS — R413 Other amnesia: Secondary | ICD-10-CM | POA: Diagnosis not present

## 2017-01-12 NOTE — Progress Notes (Signed)
NEUROLOGY CONSULTATION NOTE  Katie Palmer MRN: [id number] DOB: 02/08/1950  Referring provider: Dr. Alysia Penna Primary care provider: Dr. Alysia Penna  Reason for consult:  Memory loss  Dear Dr [name]:  Thank you for your kind referral of Katie Palmer for consultation of the above symptoms. Although her history is well known to you, please allow me to reiterate it for the purpose of our medical record. She is alone in the office today. Records and images were personally reviewed where available.  HISTORY OF PRESENT ILLNESS: This is a pleasant 66 year old right-handed woman with a history of hypertension, diabetes, fibromyalgia, presenting for evaluation of worsening memory. She reports memory changes were noticed by her and her son around 9-10 months ago. He would tell her she cannot remember things. She would forget dates and conversations. 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 She denies getting lost driving, no missed bills or medications. She reports that she had been taking Tramadol 100mg  every 4 hours for the fibromyalgia and would have withdrawal symptoms if she did not take it. She started using Kratom 3 months ago and was able to discontinue the Tramadol without tapering it off. She takes 2 tsp once a day and feels this has helped her tremendously. She feels her memory and word finding difficulties have significantly improved since stopping Tramadol. She has also noticed an improvement in her mood, she is not as depressed and it keeps her uplifted. She endorses a lot of stress with her son moving back to live with her several years ago and "won't leave." She feels he is "pulling her down." Her maternal grandfather had dementia. Her father had memory issues that were alcohol-related. She denies any history of significant head injuries. She drinks a glass of wine rarely.   She has a history of migraines since age 81, with migraines  occurring every 6 weeks or so. She has "regular" headaches on a daily basis on and off throughout the day. She has a history of vertigo with head movements. She has constant neck pain. Her hands feel tight. She has been unable to smell for years. She denies any diplopia, dysarthria/dysphagia, bowel/bladder dysfunction, focal numbness/tingling/weakness, tremors.   Laboratory Data: Lab Results  Component Value Date   TSH 2.35 10/29/2016   Lab Results  Component Value Date   [id number] 500 10/30/2016     PAST MEDICAL HISTORY: Past Medical History:  Diagnosis Date  . Depression   . Fibromyalgia   . Headache(784.0)   . History of colon polyps   . History of diabetes with ketoacidosis   . Hx of colonic polyps   . Hypertension   . Insomnia   . Insulin pump in place   . RBBB (right bundle branch block)   . RLQ abdominal pain   . Thickened endometrium   . Type 1 diabetes mellitus with long-term current use of insulin [facility]) endocrinologist-- dr Chalmers Cater   July 2015--  pt took Invokana and caused complete pancreas shut-down , pt went from pre-diabetic to adult type 1 dm now insulin dependent  . Vertigo     PAST SURGICAL HISTORY: Past Surgical History:  Procedure Laterality Date  . CARDIOVASCULAR STRESS TEST  07/26/2015   normal myocardial perfusion study w/ no ischemia/  normal LV function and wall motion , ef 71%  . COLONOSCOPY WITH PROPOFOL  last one 10/ 2017  . HYSTEROSCOPY W/D&C N/A 12/25/2015   Procedure: DILATATION AND CURETTAGE /HYSTEROSCOPY;  Surgeon: Bobbye Charleston, MD;  Location: [facility];  Service: Gynecology;  Laterality: N/A;  . LAPAROSCOPIC LYSIS OF ADHESIONS N/A 12/25/2015   Procedure: LAPAROSCOPIC LYSIS OF ADHESIONS;  Surgeon: Bobbye Charleston, MD;  Location: [place];  Service: Gynecology;  Laterality: N/A;  . LAPAROSCOPIC UNILATERAL SALPINGO OOPHERECTOMY Right 12/25/2015   Procedure: LAPAROSCOPIC UNILATERAL SALPINGO OOPHORECTOMY;   Surgeon: Bobbye Charleston, MD;  Location: [place];  Service: Gynecology;  Laterality: Right;  . LAPAROSCOPY N/A 12/25/2015   Procedure: LAPAROSCOPY OPERATIVE;  Surgeon: Bobbye Charleston, MD;  Location: [facility];  Service: Gynecology;  Laterality: N/A;  . TONSILLECTOMY  age 33  . TRANSTHORACIC ECHOCARDIOGRAM  07/26/2015   grade 1 diastolic dysfunction , ef 60-65%/  trivial TR    MEDICATIONS: Current Outpatient Medications on File Prior to Visit  Medication Sig Dispense Refill  . ALPRAZolam (XANAX) 1 MG tablet TAKE 1/2 TO 1 TABLET BY MOUTH AT BEDTIME AS NEEDED FOR ANXIETY 30 tablet 5  . DULoxetine (CYMBALTA) 30 MG capsule Take 90 capsules by mouth every evening.   2  . ezetimibe (ZETIA) 10 MG tablet Take 10 mg by mouth daily.  12  . gabapentin (NEURONTIN) 100 MG capsule Take 1 capsule (100 mg total) by mouth daily. (Patient taking differently: Take 100-200 mg by mouth daily. ) 90 capsule 3  . gabapentin (NEURONTIN) 300 MG capsule Take 300 mg by mouth at bedtime.    Marland Kitchen GLUCAGON EMERGENCY 1 MG injection Reported on 06/03/2015  2  . losartan-hydrochlorothiazide (HYZAAR) 50-12.5 MG tablet TAKE 1 TABLET BY MOUTH EVERY DAY 90 tablet 1  . meclizine (ANTIVERT) 25 MG tablet TAKE 1 TABLET (25 MG TOTAL) BY MOUTH EVERY 4 (FOUR) HOURS AS NEEDED FOR DIZZINESS. 180 tablet 0  . NOVOLOG 100 UNIT/ML injection UNITS BY PUMP DEPENDS ON SUGAR READINGS, SLIDING SCALE    . SUMAtriptan (IMITREX) 100 MG tablet TAKE 1 TABLET BY MOUTH AS NEEDED FOR MIGRAINES. MAY REPEAT IN 2 HOURS IF NEEDED 30 tablet 3  . traMADol (ULTRAM) 50 MG tablet TAKE 2 TABLETS BY MOUTH EVERY 4 HOURS AS NEEDED. 360 tablet 2  . traZODone (DESYREL) 100 MG tablet Take 1 tablet by mouth at bedtime.  1  . Vitamin D, Ergocalciferol, (DRISDOL) 50000 units CAPS capsule TAKE 1 CAPSULE (50,000 UNITS TOTAL) BY MOUTH EVERY 7 (SEVEN) DAYS. 12 capsule 3   No current facility-administered medications on file prior to visit.      ALLERGIES: Allergies  Allergen Reactions  . Armodafinil     Flu-like symptoms  . Invokana [Canagliflozin] Other (See Comments)    "shut down pancreas"   . Lasix [Furosemide] Diarrhea    Severe diarrhea    FAMILY HISTORY: Family History  Problem Relation Age of Onset  . Alcohol abuse Father        Died age 36  . Heart disease Father   . Aneurysm Mother        Died age 80  . Leukemia Brother        Died age 41  . COPD Sister   . Rheum arthritis Sister   . Heart failure Sister   . Colon cancer Neg Hx     SOCIAL HISTORY: Social History   Socioeconomic History  . Marital status: Divorced    Spouse name: Not on file  . Number of children: 1  . Years of education: Not on file  . Highest education level: Not on file  Social Needs  . Financial  resource strain: Not on file  . Food insecurity - worry: Not on file  . Food insecurity - inability: Not on file  . Transportation needs - medical: Not on file  . Transportation needs - non-medical: Not on file  Occupational History  . 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 Smoking status: Never Smoker  . Smokeless tobacco: Never Used  Substance and Sexual Activity  . Alcohol use: No    Alcohol/week: 0.0 oz  . Drug use: No  . Sexual activity: No    Birth control/protection: Post-menopausal  Other Topics Concern  . Not on file  Social History Narrative   Divorced.  Son and grandson live with her.  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    REVIEW OF SYSTEMS: Constitutional: No fevers, chills, or sweats, no generalized fatigue, change in appetite Eyes: No visual changes, double vision, eye pain Ear, nose and throat: No hearing loss, ear pain, nasal congestion, sore throat Cardiovascular: No chest pain, palpitations Respiratory:  No shortness of breath at rest or with exertion, wheezes GastrointestinaI: No nausea, vomiting, diarrhea, abdominal pain, fecal incontinence Genitourinary:  No  dysuria, urinary retention or frequency Musculoskeletal:  + neck pain, back pain Integumentary: No rash, pruritus, skin lesions Neurological: as above Psychiatric: + depression, insomnia, anxiety Endocrine: No palpitations, fatigue, diaphoresis, mood swings, change in appetite, change in weight, increased thirst Hematologic/Lymphatic:  No anemia, purpura, petechiae. Allergic/Immunologic: no itchy/runny eyes, nasal congestion, recent allergic reactions, rashes  PHYSICAL EXAM: Vitals:   01/12/17 1307 01/12/17 1322  BP: 118/82 118/78  Pulse: 89 89  SpO2: 92% 92%   General: No acute distress Head:  Normocephalic/atraumatic Eyes: Fundoscopic exam shows bilateral sharp discs, no vessel changes, exudates, or hemorrhages Neck: supple, no paraspinal tenderness, full range of motion Back: No paraspinal tenderness Heart: regular rate and rhythm Lungs: Clear to auscultation bilaterally. Vascular: No carotid bruits. Skin/Extremities: No rash, no edema Neurological Exam: Mental status: alert and oriented to person, place, and time, no dysarthria or aphasia, Fund of knowledge is appropriate.  Recent and remote memory are intact.  Attention and concentration are normal.    Able to name objects and repeat phrases.  Montreal Cognitive Assessment  01/12/2017  Visuospatial/ Executive (0/5) 5  Naming (0/3) 3  Attention: Read list of digits (0/2) 2  Attention: Read list of letters (0/1) 1  Attention: Serial 7 subtraction starting at 100 (0/3) 3  Language: Repeat phrase (0/2) 2  Language : Fluency (0/1) 1  Abstraction (0/2) 2  Delayed Recall (0/5) 1  Orientation (0/6) 5  Total 25   Cranial nerves: CN I: not tested CN II: pupils equal, round and reactive to light, visual fields intact, fundi unremarkable. CN III, IV, VI:  full range of motion, no nystagmus, no ptosis CN V: facial sensation intact CN VII: upper and lower face symmetric CN VIII: hearing intact to finger rub CN IX, X: gag intact,  uvula midline CN XI: sternocleidomastoid and trapezius muscles intact CN XII: tongue midline Bulk & Tone: normal, no fasciculations. Motor: 5/5 throughout with no pronator drift. Sensation: intact to light touch, cold, pin, vibration and joint position sense.  No extinction to double simultaneous stimulation.  Romberg test negative Deep Tendon Reflexes: +2 throughout, no ankle clonus Plantar responses: downgoing bilaterally Cerebellar: no incoordination on finger to nose, heel to shin. No dysdiadochokinesia Gait: narrow-based and steady, able to tandem walk adequately. Tremor: none  IMPRESSION: This is a pleasant 66 year old  right-handed woman with a history of hypertension, diabetes, fibromyalgia, presenting for evaluation of worsening memory. She reports that since starting Kratom 3 months ago and discontinuing Tramadol, the cognitive issues have significantly improved. Her neurological exam is normal, MOCA score today is 25/30, indicating mild cognitive impairment. We discussed that there is no evidence of dementia. We discussed different causes of memory changes. TSH and B12 normal. We discussed how mood and stress can cause memory issues, she feels this is a major part of her symptoms and continues to work with her therapist. We will continue to monitor memory and follow up in 6 months.   Thank you for allowing me to participate in the care of this patient. Please do not hesitate to call for any questions or concerns.   Ellouise Newer, M.D.  CC: Dr. [name]

## 2017-01-12 NOTE — Patient Instructions (Signed)
  RECOMMENDATIONS FOR ALL PATIENTS WITH MEMORY PROBLEMS: 1. Continue to exercise (Recommend 30 minutes of walking everyday, or 3 hours every week) 2. Increase social interactions - continue going to Church and enjoy social gatherings with friends and family 3. Eat healthy, avoid fried foods and eat more fruits and vegetables 4. Maintain adequate blood pressure, blood sugar, and blood cholesterol level. Reducing the risk of stroke and cardiovascular disease also helps promoting better memory. 5. Avoid stressful situations. Live a simple life and avoid aggravations. Organize your time and prepare for the next day in anticipation. 6. Sleep well, avoid any interruptions of sleep and avoid any distractions in the bedroom that may interfere with adequate sleep quality 7. Avoid sugar, avoid sweets as there is a strong link between excessive sugar intake, diabetes, and cognitive impairment We discussed the Mediterranean diet, which has been shown to help patients reduce the risk of progressive memory disorders and reduces cardiovascular risk. This includes eating fish, eat fruits and green leafy vegetables, nuts like almonds and hazelnuts, walnuts, and also use olive oil. Avoid fast foods and fried foods as much as possible. Avoid sweets and sugar as sugar use has been linked to worsening of memory function.   

## 2017-01-22 ENCOUNTER — Ambulatory Visit: Payer: Medicare Other | Admitting: Neurology

## 2017-01-29 ENCOUNTER — Encounter: Payer: Self-pay | Admitting: Family Medicine

## 2017-02-04 ENCOUNTER — Telehealth: Payer: Self-pay

## 2017-02-04 NOTE — Telephone Encounter (Signed)
RF sumatriptan 100 mg tablets. Last refilled 08/17/2016 disp 30 with 3 refills. Last OV 10/29/2016. Sent to PCP for approval

## 2017-02-04 NOTE — Telephone Encounter (Signed)
Call in #30 with 5 rf 

## 2017-02-05 MED ORDER — SUMATRIPTAN SUCCINATE 100 MG PO TABS: ORAL_TABLET | ORAL | 5 refills | Status: DC

## 2017-02-05 NOTE — Telephone Encounter (Signed)
Rx has been sent  

## 2017-02-23 ENCOUNTER — Encounter: Payer: Self-pay | Admitting: Family Medicine

## 2017-03-02 NOTE — Telephone Encounter (Signed)
I can certainly prescribe these for her, but I would like to discuss things with her in more detail. She should make an OV to talk about it

## 2017-03-06 ENCOUNTER — Other Ambulatory Visit: Payer: Self-pay | Admitting: Family Medicine

## 2017-03-08 NOTE — Telephone Encounter (Signed)
Last OV 10/29/2016   Last refilled 12/31/2016 disp 360 with 2 refills  Sent to PCP

## 2017-03-18 ENCOUNTER — Telehealth: Payer: Self-pay | Admitting: *Deleted

## 2017-03-18 ENCOUNTER — Telehealth: Payer: Self-pay

## 2017-03-18 ENCOUNTER — Telehealth (INDEPENDENT_AMBULATORY_CARE_PROVIDER_SITE_OTHER): Payer: Self-pay | Admitting: Orthopedic Surgery

## 2017-03-18 NOTE — Telephone Encounter (Signed)
Patient called and stated she spoke to Dr Sharol Given and he told her of a brand name of a sneaker(cross training shoe) that he wears and she wanted to know the brand.  Please call patient to advise

## 2017-03-18 NOTE — Telephone Encounter (Signed)
Katie Palmer called requesting an earlier new patient appointment with Dr. Denman George as her vaginal bleeding has increased. She has been going through 3 pads a day for the last ~4 weeks.  She went through 5 pads yesterday. Told her to call referring physician Dr. Philis Pique to discuss increased bleeding. Pt will call if she has another day of heavy bleeding. Told her that an appointment opened up for Tuesday 03-23-17 at 1000.  Pt pleased that she would be seen sooner then 03-31-17.

## 2017-03-18 NOTE — Telephone Encounter (Signed)
Dr Denman George opened up her schedule on February 26th, moved the patients appt to this day at 10am.

## 2017-03-19 NOTE — Telephone Encounter (Signed)
Called and lm on vm to advise [employer]oka shoe.

## 2017-03-23 ENCOUNTER — Inpatient Hospital Stay: Payer: Medicare Other

## 2017-03-23 ENCOUNTER — Inpatient Hospital Stay: Payer: Medicare Other | Attending: Gynecologic Oncology | Admitting: Gynecologic Oncology

## 2017-03-23 ENCOUNTER — Telehealth: Payer: Self-pay | Admitting: *Deleted

## 2017-03-23 ENCOUNTER — Encounter: Payer: Self-pay | Admitting: Gynecologic Oncology

## 2017-03-23 VITALS — BP 138/82 | HR 72 | Temp 98.1°F | Resp 18 | Ht 65.0 in | Wt 226.1 lb

## 2017-03-23 DIAGNOSIS — C541 Malignant neoplasm of endometrium: Secondary | ICD-10-CM | POA: Insufficient documentation

## 2017-03-23 DIAGNOSIS — E1010 Type 1 diabetes mellitus with ketoacidosis without coma: Secondary | ICD-10-CM | POA: Insufficient documentation

## 2017-03-23 DIAGNOSIS — Z794 Long term (current) use of insulin: Secondary | ICD-10-CM | POA: Diagnosis not present

## 2017-03-23 LAB — BASIC METABOLIC PANEL
ANION GAP: 9 (ref 3–11)
BUN: 17 mg/dL (ref 7–26)
CALCIUM: 9.5 mg/dL (ref 8.4–10.4)
CO2: 29 mmol/L (ref 22–29)
Chloride: 102 mmol/L (ref 98–109)
Creatinine, Ser: 0.83 mg/dL (ref 0.60–1.10)
Glucose, Bld: 128 mg/dL (ref 70–140)
POTASSIUM: 4 mmol/L (ref 3.5–5.1)
Sodium: 140 mmol/L (ref 136–145)

## 2017-03-23 NOTE — Patient Instructions (Signed)
Plan on having a CT scan of the abdomen and pelvis with contrast and we will contact you with the results.                Preparing for your Surgery  Plan for surgery on April 15, 2017 with Dr. Everitt Amber at [facility] will be scheduled for a robotic assisted total hysterectomy, bilateral salpingo-oophorectomy, sentinel lymph node biopsy.    Pre-operative Testing -You will receive a phone call from presurgical testing at [facility] to arrange for a pre-operative testing appointment before your surgery.  This appointment normally occurs one to two weeks before your scheduled surgery.   -Bring your insurance card, copy of an advanced directive if applicable, medication list  -At that visit, you will be asked to sign a consent for a possible blood transfusion in case a transfusion becomes necessary during surgery.  The need for a blood transfusion is rare but having consent is a necessary part of your care.    -You should not be taking blood thinners or aspirin at least ten days prior to surgery unless instructed by your surgeon.  Day Before Surgery at [facility] will be asked to take in a light diet the day before surgery.  Avoid carbonated beverages.  You will be advised to have nothing to eat or drink after midnight the evening before.    Eat a light diet the day before surgery.  Examples including soups, broths, toast, yogurt, mashed potatoes.  Things to avoid include carbonated beverages (fizzy beverages), raw fruits and raw vegetables, or beans.   If your bowels are filled with gas, your surgeon will have difficulty visualizing your pelvic organs which increases your surgical risks.  Your role in recovery Your role is to become active as soon as directed by your doctor, while still giving yourself time to heal.  Rest when you feel tired. You will be asked to do the following in order to speed your recovery:  - Cough and breathe deeply. This helps toclear and expand  your lungs and can prevent pneumonia. You may be given a spirometer to practice deep breathing. A staff member will show you how to use the spirometer. - Do mild physical activity. Walking or moving your legs help your circulation and body functions return to normal. A staff member will help you when you try to walk and will provide you with simple exercises. Do not try to get up or walk alone the first time. - Actively manage your pain. Managing your pain lets you move in comfort. We will ask you to rate your pain on a scale of zero to 10. It is your responsibility to tell your doctor or nurse where and how much you hurt so your pain can be treated.  Special Considerations -If you are diabetic, you may be placed on insulin after surgery to have closer control over your blood sugars to promote healing and recovery.  This does not mean that you will be discharged on insulin.  If applicable, your oral antidiabetics will be resumed when you are tolerating a solid diet.  -Your final pathology results from surgery should be available by the Friday after surgery and the results will be relayed to you when available.  -Dr. Lahoma Crocker is the Surgeon that assists your GYN Oncologist with surgery.  The next day after your surgery you will either see your GYN Oncologist or Dr. Lahoma Crocker.   Blood Transfusion Information WHAT IS  A BLOOD TRANSFUSION? A transfusion is the replacement of blood or some of its parts. Blood is made up of multiple cells which provide different functions.  Red blood cells carry oxygen and are used for blood loss replacement.  White blood cells fight against infection.  Platelets control bleeding.  Plasma helps clot blood.  Other blood products are available for specialized needs, such as hemophilia or other clotting disorders. BEFORE THE TRANSFUSION  Who gives blood for transfusions?   You may be able to donate blood to be used at a later date on yourself  (autologous donation).  Relatives can be asked to donate blood. This is generally not any safer than if you have received blood from a stranger. The same precautions are taken to ensure safety when a relative's blood is donated.  Healthy volunteers who are fully evaluated to make sure their blood is safe. This is blood bank blood. Transfusion therapy is the safest it has ever been in the practice of medicine. Before blood is taken from a donor, a complete history is taken to make sure that person has no history of diseases nor engages in risky social behavior (examples are intravenous drug use or sexual activity with multiple partners). The donor's travel history is screened to minimize risk of transmitting infections, such as malaria. The donated blood is tested for signs of infectious diseases, such as HIV and hepatitis. The blood is then tested to be sure it is compatible with you in order to minimize the chance of a transfusion reaction. If you or a relative donates blood, this is often done in anticipation of surgery and is not appropriate for emergency situations. It takes many days to process the donated blood. RISKS AND COMPLICATIONS Although transfusion therapy is very safe and saves many lives, the main dangers of transfusion include:   Getting an infectious disease.  Developing a transfusion reaction. This is an allergic reaction to something in the blood you were given. Every precaution is taken to prevent this. The decision to have a blood transfusion has been considered carefully by your caregiver before blood is given. Blood is not given unless the benefits outweigh the risks.

## 2017-03-23 NOTE — Telephone Encounter (Signed)
[facility] number #[id number]

## 2017-03-23 NOTE — Progress Notes (Signed)
Consult Note: Gyn-Onc  Consult was requested by Dr. Philis Pique for the evaluation of Katie Palmer 67 y.o. female  CC:  Chief Complaint  Patient presents with  . Endometrial adenocarcinoma [facility])    Assessment/Plan:  Katie Palmer  is a 67 y.o.  year old with grade 3 endometrial cancer and type I diabetes mellitus.  A detailed discussion was held with the patient and her family with regard to to her endometrial cancer diagnosis. We discussed the standard management options for uterine cancer which includes surgery followed possibly by adjuvant therapy depending on the results of surgery. The options for surgical management include a hysterectomy and removal of the tubes and ovaries possibly with removal of pelvic and para-aortic lymph nodes.If feasible, a minimally invasive approach including a robotic hysterectomy or laparoscopic hysterectomy have benefits including shorter hospital stay, recovery time and better wound healing than with open surgery. The patient has been counseled about these surgical options and the risks of surgery in general including infection, bleeding, damage to surrounding structures (including bowel, bladder, ureters, nerves or vessels), and the postoperative risks of PE/ DVT, and lymphedema. I extensively reviewed the additional risks of robotic hysterectomy including possible need for conversion to open laparotomy.  I discussed positioning during surgery of trendelenberg and risks of minor facial swelling and care we take in preoperative positioning.  She understands that her diabetes and obesity place her at increased risks for these complications. After counseling and consideration of her options, she desires to proceed with robotic assisted total hysterectomy with bilateral sapingo-oophorectomy and SLN biopsy.   She will be seen by anesthesia for preoperative clearance and discussion of postoperative pain management.  She was given the opportunity to ask questions,  which were answered to her satisfaction, and she is agreement with the above mentioned plan of care.   HPI: Katie Palmer is a 67 year old P1 who is seen in consultation at the request of Dr Philis Pique for grade 3 endometrial cancer.  The patient reports postmenopausal bleeding since December, 2018. Pap smear showed AGUS. Endometrial biopsy on 03/17/17 showed grade 3 endometrial adenocarcinoma.   The patient is morbidly obese and has type I diabetes mellitus for which she uses an insulin pump. Her HbA1c is 8.2. She acknowledges that poor dietary control and decreased activity are the reasons for the elevated HbA1c. She has had a laparotomy for an ectopic pregnancy. She has a history of 1 SVD. She has no family history of breast/ovarian/colon cancers.   Current Meds:  Outpatient Encounter Medications as of 03/23/2017  Medication Sig  . ALPRAZolam (XANAX) 1 MG tablet TAKE 1/2 TO 1 TABLET BY MOUTH AT BEDTIME AS NEEDED FOR ANXIETY  . DULoxetine (CYMBALTA) 30 MG capsule Take 90 capsules by mouth every evening.   . Ergocalciferol (VITAMIN D2 PO) Take 1 capsule by mouth every 7 (seven) days. Ergocalciferol Vitalmin D2 50,000 u capsule  . ezetimibe (ZETIA) 10 MG tablet Take 10 mg by mouth daily.  Marland Kitchen gabapentin (NEURONTIN) 300 MG capsule Take 300 mg by mouth at bedtime.  Marland Kitchen GLUCAGON EMERGENCY 1 MG injection Reported on 06/03/2015  . losartan-hydrochlorothiazide (HYZAAR) 50-12.5 MG tablet TAKE 1 TABLET BY MOUTH EVERY DAY  . NOVOLOG 100 UNIT/ML injection UNITS BY PUMP DEPENDS ON SUGAR READINGS, SLIDING SCALE  . SUMAtriptan (IMITREX) 100 MG tablet TAKE 1 TABLET BY MOUTH AS NEEDED FOR MIGRAINES. MAY REPEAT IN 2 HOURS IF NEEDED  . traMADol (ULTRAM) 50 MG tablet TAKE 2 TABLETS BY MOUTH EVERY 4 HOURS  AS NEEDED.  Marland Kitchen traZODone (DESYREL) 100 MG tablet Take 1 tablet by mouth at bedtime.  . [DISCONTINUED] gabapentin (NEURONTIN) 100 MG capsule Take 1 capsule (100 mg total) by mouth daily. (Patient taking differently: Take  100-200 mg by mouth daily. )  . [DISCONTINUED] meclizine (ANTIVERT) 25 MG tablet TAKE 1 TABLET (25 MG TOTAL) BY MOUTH EVERY 4 (FOUR) HOURS AS NEEDED FOR DIZZINESS.  . [DISCONTINUED] Vitamin D, Ergocalciferol, (DRISDOL) 50000 units CAPS capsule TAKE 1 CAPSULE (50,000 UNITS TOTAL) BY MOUTH EVERY 7 (SEVEN) DAYS.   No facility-administered encounter medications on file as of 03/23/2017.     Allergy:  Allergies  Allergen Reactions  . Armodafinil     Flu-like symptoms  . Invokana [Canagliflozin] Other (See Comments)    "shut down pancreas"   . Lasix [Furosemide] Diarrhea    Severe diarrhea    Social Hx:   Social History   Socioeconomic History  . Marital status: Divorced    Spouse name: Not on file  . Number of children: 1  . Years of education: Not on file  . Highest education level: Not on file  Social Needs  . Financial resource strain: Not on file  . Food insecurity - worry: Not on file  . Food insecurity - inability: Not on file  . Transportation needs - medical: Not on file  . Transportation needs - non-medical: Not on file  Occupational History  . 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 Smoking status: Never Smoker  . Smokeless tobacco: Never Used  Substance and Sexual Activity  . Alcohol use: No    Alcohol/week: 0.0 oz  . Drug use: No  . Sexual activity: No    Birth control/protection: Post-menopausal  Other Topics Concern  . Not on file  Social History Narrative   Divorced.  Son and grandson live with her.  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    Past Surgical Hx:  Past Surgical History:  Procedure Laterality Date  . CARDIOVASCULAR STRESS TEST  07/26/2015   normal myocardial perfusion study w/ no ischemia/  normal LV function and wall motion , ef 71%  . COLONOSCOPY WITH PROPOFOL  last one 10/ 2017  . HYSTEROSCOPY W/D&C N/A 12/25/2015   Procedure: DILATATION AND CURETTAGE /HYSTEROSCOPY;  Surgeon: Bobbye Charleston, MD;   Location: [facility];  Service: Gynecology;  Laterality: N/A;  . LAPAROSCOPIC LYSIS OF ADHESIONS N/A 12/25/2015   Procedure: LAPAROSCOPIC LYSIS OF ADHESIONS;  Surgeon: Bobbye Charleston, MD;  Location: [place];  Service: Gynecology;  Laterality: N/A;  . LAPAROSCOPIC UNILATERAL SALPINGO OOPHERECTOMY Right 12/25/2015   Procedure: LAPAROSCOPIC UNILATERAL SALPINGO OOPHORECTOMY;  Surgeon: Bobbye Charleston, MD;  Location: [place];  Service: Gynecology;  Laterality: Right;  . LAPAROSCOPY N/A 12/25/2015   Procedure: LAPAROSCOPY OPERATIVE;  Surgeon: Bobbye Charleston, MD;  Location: [facility];  Service: Gynecology;  Laterality: N/A;  . TONSILLECTOMY  age 52  . TRANSTHORACIC ECHOCARDIOGRAM  07/26/2015   grade 1 diastolic dysfunction , ef 60-65%/  trivial TR    Past Medical Hx:  Past Medical History:  Diagnosis Date  . Depression   . Fibromyalgia   . Headache(784.0)   . History of colon polyps   . History of diabetes with ketoacidosis   . Hx of colonic polyps   . Hypertension   . Insomnia   . Insulin pump in place   . RBBB (right bundle branch block)   . RLQ  abdominal pain   . Thickened endometrium   . Type 1 diabetes mellitus with long-term current use of insulin [facility]) endocrinologist-- dr Chalmers Cater   July 2015--  pt took Invokana and caused complete pancreas shut-down , pt went from pre-diabetic to adult type 1 dm now insulin dependent  . Vertigo     Past Gynecological History:  SVD x 1 No LMP recorded. Patient is postmenopausal.  Family Hx:  Family History  Problem Relation Age of Onset  . Alcohol abuse Father        Died age 70  . Heart disease Father   . Aneurysm Mother        Died age 11  . Leukemia Brother        Died age 59  . COPD Sister   . Rheum arthritis Sister   . Heart failure Sister   . Dementia Maternal Uncle   . Colon cancer Neg Hx     Review of Systems:  Constitutional  Feels anxious   ENT Normal appearing ears and nares bilaterally Skin/Breast  No rash, sores, jaundice, itching, dryness Cardiovascular  No chest pain, shortness of breath, or edema  Pulmonary  No cough or wheeze.  Gastro Intestinal  No nausea, vomitting, or diarrhoea. No bright red blood per rectum, no abdominal pain, change in bowel movement, or constipation.  Genito Urinary  No frequency, urgency, dysuria, + postmenopausal bleeding Musculo Skeletal  No myalgia, arthralgia, joint swelling or pain  Neurologic  + neuropathy right foot,  Psychology  No depression, anxiety, insomnia.   Vitals:  Blood pressure 138/82, pulse 72, temperature 98.1 F (36.7 C), temperature source Oral, resp. rate 18, height 5\' 5"  (1.651 m), weight 226 lb 1.6 oz (102.6 kg), SpO2 98 %.  Physical Exam: WD in NAD Neck  Supple NROM, without any enlargements.  Lymph Node Survey No cervical supraclavicular or inguinal adenopathy Cardiovascular  Pulse normal rate, regularity and rhythm. S1 and S2 normal.  Lungs  Clear to auscultation bilateraly, without wheezes/crackles/rhonchi. Good air movement.  Skin  No rash/lesions/breakdown  Psychiatry  Alert and oriented to person, place, and time  Abdomen  Normoactive bowel sounds, abdomen soft, non-tender and obese without evidence of hernia. Insulin pump on abdomen.  Back No CVA tenderness Genito Urinary  Vulva/vagina: Normal external female genitalia.   No lesions. No discharge or bleeding.  Bladder/urethra:  No lesions or masses, well supported bladder  Vagina: normal  Cervix: Normal appearing, no lesions.  Uterus:  Small, mobile, no parametrial involvement or nodularity.  Adnexa: no palpable masses. Rectal  deferred Extremities  No bilateral cyanosis, clubbing or edema.   Donaciano Eva, MD  03/23/2017, 10:50 AM

## 2017-03-23 NOTE — H&P (View-Only) (Signed)
Consult Note: Gyn-Onc  Consult was requested by Dr. Philis Pique for the evaluation of Katie Palmer 67 y.o. female  CC:  Chief Complaint  Patient presents with  . Endometrial adenocarcinoma [facility])    Assessment/Plan:  Katie. Sherhonda Gaspar  is a 67 y.o.  year old with grade 3 endometrial cancer and type I diabetes mellitus.  A detailed discussion was held with the patient and her family with regard to to her endometrial cancer diagnosis. We discussed the standard management options for uterine cancer which includes surgery followed possibly by adjuvant therapy depending on the results of surgery. The options for surgical management include a hysterectomy and removal of the tubes and ovaries possibly with removal of pelvic and para-aortic lymph nodes.If feasible, a minimally invasive approach including a robotic hysterectomy or laparoscopic hysterectomy have benefits including shorter hospital stay, recovery time and better wound healing than with open surgery. The patient has been counseled about these surgical options and the risks of surgery in general including infection, bleeding, damage to surrounding structures (including bowel, bladder, ureters, nerves or vessels), and the postoperative risks of PE/ DVT, and lymphedema. I extensively reviewed the additional risks of robotic hysterectomy including possible need for conversion to open laparotomy.  I discussed positioning during surgery of trendelenberg and risks of minor facial swelling and care we take in preoperative positioning.  She understands that her diabetes and obesity place her at increased risks for these complications. After counseling and consideration of her options, she desires to proceed with robotic assisted total hysterectomy with bilateral sapingo-oophorectomy and SLN biopsy.   She will be seen by anesthesia for preoperative clearance and discussion of postoperative pain management.  She was given the opportunity to ask questions,  which were answered to her satisfaction, and she is agreement with the above mentioned plan of care.   HPI: Katie Palmer is a 67 year old P1 who is seen in consultation at the request of Dr Philis Pique for grade 3 endometrial cancer.  The patient reports postmenopausal bleeding since December, 2018. Pap smear showed AGUS. Endometrial biopsy on 03/17/17 showed grade 3 endometrial adenocarcinoma.   The patient is morbidly obese and has type I diabetes mellitus for which she uses an insulin pump. Her HbA1c is 8.2. She acknowledges that poor dietary control and decreased activity are the reasons for the elevated HbA1c. She has had a laparotomy for an ectopic pregnancy. She has a history of 1 SVD. She has no family history of breast/ovarian/colon cancers.   Current Meds:  Outpatient Encounter Medications as of 03/23/2017  Medication Sig  . ALPRAZolam (XANAX) 1 MG tablet TAKE 1/2 TO 1 TABLET BY MOUTH AT BEDTIME AS NEEDED FOR ANXIETY  . DULoxetine (CYMBALTA) 30 MG capsule Take 90 capsules by mouth every evening.   . Ergocalciferol (VITAMIN D2 PO) Take 1 capsule by mouth every 7 (seven) days. Ergocalciferol Vitalmin D2 50,000 u capsule  . ezetimibe (ZETIA) 10 MG tablet Take 10 mg by mouth daily.  Marland Kitchen gabapentin (NEURONTIN) 300 MG capsule Take 300 mg by mouth at bedtime.  Marland Kitchen GLUCAGON EMERGENCY 1 MG injection Reported on 06/03/2015  . losartan-hydrochlorothiazide (HYZAAR) 50-12.5 MG tablet TAKE 1 TABLET BY MOUTH EVERY DAY  . NOVOLOG 100 UNIT/ML injection UNITS BY PUMP DEPENDS ON SUGAR READINGS, SLIDING SCALE  . SUMAtriptan (IMITREX) 100 MG tablet TAKE 1 TABLET BY MOUTH AS NEEDED FOR MIGRAINES. MAY REPEAT IN 2 HOURS IF NEEDED  . traMADol (ULTRAM) 50 MG tablet TAKE 2 TABLETS BY MOUTH EVERY 4 HOURS  AS NEEDED.  Marland Kitchen traZODone (DESYREL) 100 MG tablet Take 1 tablet by mouth at bedtime.  . [DISCONTINUED] gabapentin (NEURONTIN) 100 MG capsule Take 1 capsule (100 mg total) by mouth daily. (Patient taking differently: Take  100-200 mg by mouth daily. )  . [DISCONTINUED] meclizine (ANTIVERT) 25 MG tablet TAKE 1 TABLET (25 MG TOTAL) BY MOUTH EVERY 4 (FOUR) HOURS AS NEEDED FOR DIZZINESS.  . [DISCONTINUED] Vitamin D, Ergocalciferol, (DRISDOL) 50000 units CAPS capsule TAKE 1 CAPSULE (50,000 UNITS TOTAL) BY MOUTH EVERY 7 (SEVEN) DAYS.   No facility-administered encounter medications on file as of 03/23/2017.     Allergy:  Allergies  Allergen Reactions  . Armodafinil     Flu-like symptoms  . Invokana [Canagliflozin] Other (See Comments)    "shut down pancreas"   . Lasix [Furosemide] Diarrhea    Severe diarrhea    Social Hx:   Social History   Socioeconomic History  . Marital status: Divorced    Spouse name: Not on file  . Number of children: 1  . Years of education: Not on file  . Highest education level: Not on file  Social Needs  . Financial resource strain: Not on file  . Food insecurity - worry: Not on file  . Food insecurity - inability: Not on file  . Transportation needs - medical: Not on file  . Transportation needs - non-medical: Not on file  Occupational History  . 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 Smoking status: Never Smoker  . Smokeless tobacco: Never Used  Substance and Sexual Activity  . Alcohol use: No    Alcohol/week: 0.0 oz  . Drug use: No  . Sexual activity: No    Birth control/protection: Post-menopausal  Other Topics Concern  . Not on file  Social History Narrative   Divorced.  Son and grandson live with her.  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    Past Surgical Hx:  Past Surgical History:  Procedure Laterality Date  . CARDIOVASCULAR STRESS TEST  07/26/2015   normal myocardial perfusion study w/ no ischemia/  normal LV function and wall motion , ef 71%  . COLONOSCOPY WITH PROPOFOL  last one 10/ 2017  . HYSTEROSCOPY W/D&C N/A 12/25/2015   Procedure: DILATATION AND CURETTAGE /HYSTEROSCOPY;  Surgeon: Bobbye Charleston, MD;   Location: [facility];  Service: Gynecology;  Laterality: N/A;  . LAPAROSCOPIC LYSIS OF ADHESIONS N/A 12/25/2015   Procedure: LAPAROSCOPIC LYSIS OF ADHESIONS;  Surgeon: Bobbye Charleston, MD;  Location: [place];  Service: Gynecology;  Laterality: N/A;  . LAPAROSCOPIC UNILATERAL SALPINGO OOPHERECTOMY Right 12/25/2015   Procedure: LAPAROSCOPIC UNILATERAL SALPINGO OOPHORECTOMY;  Surgeon: Bobbye Charleston, MD;  Location: [place];  Service: Gynecology;  Laterality: Right;  . LAPAROSCOPY N/A 12/25/2015   Procedure: LAPAROSCOPY OPERATIVE;  Surgeon: Bobbye Charleston, MD;  Location: [facility];  Service: Gynecology;  Laterality: N/A;  . TONSILLECTOMY  age 43  . TRANSTHORACIC ECHOCARDIOGRAM  07/26/2015   grade 1 diastolic dysfunction , ef 60-65%/  trivial TR    Past Medical Hx:  Past Medical History:  Diagnosis Date  . Depression   . Fibromyalgia   . Headache(784.0)   . History of colon polyps   . History of diabetes with ketoacidosis   . Hx of colonic polyps   . Hypertension   . Insomnia   . Insulin pump in place   . RBBB (right bundle branch block)   . RLQ  abdominal pain   . Thickened endometrium   . Type 1 diabetes mellitus with long-term current use of insulin [facility]) endocrinologist-- dr Chalmers Cater   July 2015--  pt took Invokana and caused complete pancreas shut-down , pt went from pre-diabetic to adult type 1 dm now insulin dependent  . Vertigo     Past Gynecological History:  SVD x 1 No LMP recorded. Patient is postmenopausal.  Family Hx:  Family History  Problem Relation Age of Onset  . Alcohol abuse Father        Died age 60  . Heart disease Father   . Aneurysm Mother        Died age 35  . Leukemia Brother        Died age 32  . COPD Sister   . Rheum arthritis Sister   . Heart failure Sister   . Dementia Maternal Uncle   . Colon cancer Neg Hx     Review of Systems:  Constitutional  Feels anxious   ENT Normal appearing ears and nares bilaterally Skin/Breast  No rash, sores, jaundice, itching, dryness Cardiovascular  No chest pain, shortness of breath, or edema  Pulmonary  No cough or wheeze.  Gastro Intestinal  No nausea, vomitting, or diarrhoea. No bright red blood per rectum, no abdominal pain, change in bowel movement, or constipation.  Genito Urinary  No frequency, urgency, dysuria, + postmenopausal bleeding Musculo Skeletal  No myalgia, arthralgia, joint swelling or pain  Neurologic  + neuropathy right foot,  Psychology  No depression, anxiety, insomnia.   Vitals:  Blood pressure 138/82, pulse 72, temperature 98.1 F (36.7 C), temperature source Oral, resp. rate 18, height 5\' 5"  (1.651 m), weight 226 lb 1.6 oz (102.6 kg), SpO2 98 %.  Physical Exam: WD in NAD Neck  Supple NROM, without any enlargements.  Lymph Node Survey No cervical supraclavicular or inguinal adenopathy Cardiovascular  Pulse normal rate, regularity and rhythm. S1 and S2 normal.  Lungs  Clear to auscultation bilateraly, without wheezes/crackles/rhonchi. Good air movement.  Skin  No rash/lesions/breakdown  Psychiatry  Alert and oriented to person, place, and time  Abdomen  Normoactive bowel sounds, abdomen soft, non-tender and obese without evidence of hernia. Insulin pump on abdomen.  Back No CVA tenderness Genito Urinary  Vulva/vagina: Normal external female genitalia.   No lesions. No discharge or bleeding.  Bladder/urethra:  No lesions or masses, well supported bladder  Vagina: normal  Cervix: Normal appearing, no lesions.  Uterus:  Small, mobile, no parametrial involvement or nodularity.  Adnexa: no palpable masses. Rectal  deferred Extremities  No bilateral cyanosis, clubbing or edema.   Donaciano Eva, MD  03/23/2017, 10:50 AM

## 2017-03-26 ENCOUNTER — Ambulatory Visit (HOSPITAL_COMMUNITY)
Admission: RE | Admit: 2017-03-26 | Discharge: 2017-03-26 | Disposition: A | Discharge status: Home / Self Care | Payer: Medicare Other | Source: Ambulatory Visit | Attending: Gynecologic Oncology | Admitting: Gynecologic Oncology

## 2017-03-26 DIAGNOSIS — C541 Malignant neoplasm of endometrium: Secondary | ICD-10-CM | POA: Insufficient documentation

## 2017-03-26 DIAGNOSIS — R918 Other nonspecific abnormal finding of lung field: Secondary | ICD-10-CM | POA: Diagnosis not present

## 2017-03-26 MED ORDER — IOPAMIDOL (ISOVUE-300) INJECTION 61%
INTRAVENOUS | Status: AC
  Filled 2017-03-26: qty 100

## 2017-03-26 MED ORDER — IOPAMIDOL (ISOVUE-300) INJECTION 61%
100.0000 mL | Freq: Once | INTRAVENOUS | Status: AC | PRN
  Administered 2017-03-26: 100 mL via INTRAVENOUS

## 2017-03-26 MED ORDER — SODIUM CHLORIDE 0.9 % IJ SOLN
INTRAMUSCULAR | Status: DC
Start: 2017-03-26 — End: 2017-03-27
  Filled 2017-03-26: qty 50

## 2017-03-29 ENCOUNTER — Telehealth: Payer: Self-pay | Admitting: Gynecologic Oncology

## 2017-03-29 NOTE — Telephone Encounter (Signed)
Patient informed of CT scan results.  All questions answered.  Advised to call for any questions or concerns.

## 2017-03-31 ENCOUNTER — Ambulatory Visit: Payer: Medicare Other | Admitting: Gynecologic Oncology

## 2017-04-02 ENCOUNTER — Ambulatory Visit: Payer: Medicare Other | Admitting: Gynecologic Oncology

## 2017-04-07 NOTE — Progress Notes (Signed)
03-01-17 LOV w/Dr. Chalmers Cater and HGA1C 8.2 on chart  11-16-16 (Epic) EKG  07-26-15 (Epic) ECHO, STRESS

## 2017-04-07 NOTE — Patient Instructions (Addendum)
Katie Palmer  04/07/2017   Your procedure is scheduled on: 04-15-17     Report to [place] AM. Have a seat in the Main Lobby. Please note there is a phone at the [employer]. Please call [phone] on that phone. Someone from Short Stay will come and get you from the Main Lobby and take you to Short Stay.    Call this number if you have problems the morning of surgery [phone]   Remember: NO SOLID FOOD AFTER MIDNIGHT THE NIGHT PRIOR TO SURGERY. NOTHING BY MOUTH EXCEPT CLEAR LIQUIDS UNTIL 3  HOURS PRIOR TO [place] SURGERY. PLEASE FINISH ENSURE DRINK PER SURGEON ORDER 3 HOURS PRIOR TO SCHEDULED  SURGERY TIME WHICH NEEDS TO BE COMPLETED AT 4:30 AM.   Take these medicines the morning of surgery with A SIP OF WATER: Gabapentin (Neurontin)     CLEAR LIQUID DIET   Foods Allowed                                                                     Foods Excluded  Coffee and tea, regular and decaf                             liquids that you cannot  Plain Jell-O in any flavor                                             see through such as: Fruit ices (not with fruit pulp)                                     milk, soups, orange juice  Iced Popsicles                                    All solid food Carbonated beverages, regular and diet                                    Cranberry, grape and apple juices Sports drinks like Gatorade Lightly seasoned clear broth or consume(fat free) Sugar, honey syrup  Sample Menu Breakfast                                Lunch                                     Supper Cranberry juice                    Beef broth  Chicken broth Jell-O                                     Grape juice                           Apple juice Coffee or tea                        Jell-O                                      Popsicle                                                Coffee or tea                         Coffee or tea  _____________________________________________________________________    DO NOT TAKE ANY DIABETIC MEDICATIONS DAY OF YOUR SURGERY                               You may not have any metal on your body including hair pins and              piercings  Do not wear jewelry, make-up, lotions, powders or perfumes, deodorant             Do not wear nail polish.  Do not shave  48 hours prior to surgery.                 Do not bring valuables to the hospital. [place].  Contacts, dentures or bridgework may not be worn into surgery.  Leave suitcase in the car. After surgery it may be brought to your room.              Please read over the following fact sheets you were given: _____________________________________________________________________           [facility] - Preparing for Surgery Before surgery, you can play an important role.  Because skin is not sterile, your skin needs to be as free of germs as possible.  You can reduce the number of germs on your skin by washing with CHG (chlorahexidine gluconate) soap before surgery.  CHG is an antiseptic cleaner which kills germs and bonds with the skin to continue killing germs even after washing. Please DO NOT use if you have an allergy to CHG or antibacterial soaps.  If your skin becomes reddened/irritated stop using the CHG and inform your nurse when you arrive at Short Stay. Do not shave (including legs and underarms) for at least 48 hours prior to the first CHG shower.  You may shave your face/neck. Please follow these instructions carefully:  1.  Shower with CHG Soap the night before surgery and the  morning of Surgery.  2.  If you choose to wash your hair, wash your hair first as usual with your  normal  shampoo.  3.  After you shampoo, rinse your hair and body thoroughly to remove the  shampoo.                           4.  Use CHG as you would any other liquid soap.  You can  apply chg directly  to the skin and wash                       Gently with a scrungie or clean washcloth.  5.  Apply the CHG Soap to your body ONLY FROM THE NECK DOWN.   Do not use on face/ open                           Wound or open sores. Avoid contact with eyes, ears mouth and genitals (private parts).                       Wash face,  Genitals (private parts) with your normal soap.             6.  Wash thoroughly, paying special attention to the area where your surgery  will be performed.  7.  Thoroughly rinse your body with warm water from the neck down.  8.  DO NOT shower/wash with your normal soap after using and rinsing off  the CHG Soap.                9.  Pat yourself dry with a clean towel.            10.  Wear clean pajamas.            11.  Place clean sheets on your bed the night of your first shower and do not  sleep with pets. Day of Surgery : Do not apply any lotions/deodorants the morning of surgery.  Please wear clean clothes to the hospital/surgery center.  FAILURE TO FOLLOW THESE INSTRUCTIONS MAY RESULT IN THE CANCELLATION OF YOUR SURGERY PATIENT SIGNATURE_________________________________  NURSE SIGNATURE__________________________________  ________________________________________________________________________ How to Manage Your Diabetes Before and After Surgery  Why is it important to control my blood sugar before and after surgery? . Improving blood sugar levels before and after surgery helps healing and can limit problems. . A way of improving blood sugar control is eating a healthy diet by: o  Eating less sugar and carbohydrates o  Increasing activity/exercise o  Talking with your doctor about reaching your blood sugar goals . High blood sugars (greater than 180 mg/dL) can raise your risk of infections and slow your recovery, so you will need to focus on controlling your diabetes during the weeks before surgery. . Make sure that the doctor who takes care of  your diabetes knows about your planned surgery including the date and location.  How do I manage my blood sugar before surgery? . Check your blood sugar at least 4 times a day, starting 2 days before surgery, to make sure that the level is not too high or low. o Check your blood sugar the morning of your surgery when you wake up and every 2 hours until you get to the Short Stay unit. . If your blood sugar is less than 70 mg/dL, you will need to treat for low blood sugar: o Do not take insulin. o Treat a low blood sugar (less than 70 mg/dL) with  cup of  clear juice (cranberry or apple), 4 glucose tablets, OR glucose gel. o Recheck blood sugar in 15 minutes after treatment (to make sure it is greater than 70 mg/dL). If your blood sugar is not greater than 70 mg/dL on recheck, call [phone] for further instructions. . Report your blood sugar to the short stay nurse when you get to Short Stay.  . If you are admitted to the hospital after surgery: o Your blood sugar will be checked by the staff and you will probably be given insulin after surgery (instead of oral diabetes medicines) to make sure you have good blood sugar levels. o The goal for blood sugar control after surgery is 80-180 mg/dL.     For patients with insulin pumps: Contact your diabetes doctor for specific instructions before surgery. Decrease basal rates by 20% at midnight the night before your surgery. Note that if your surgery is planned to be longer than 2 hours, your insulin pump will be removed and intravenous (IV) insulin will be started and managed by the nurses and the anesthesiologist. You will be able to restart your insulin pump once you are awake and able to manage it.  Make sure to bring insulin pump supplies to the hospital with you in case the  site needs to be changed.  Patient Signature:  Date:   Nurse Signature:  Date:   Reviewed and Endorsed by [facility] Patient Education Committee, August  2015  Incentive Spirometer  An incentive spirometer is a tool that can help keep your lungs clear and active. This tool measures how well you are filling your lungs with each breath. Taking long deep breaths may help reverse or decrease the chance of developing breathing (pulmonary) problems (especially infection) following:  A long period of time when you are unable to move or be active. BEFORE THE PROCEDURE   If the spirometer includes an indicator to show your best effort, your nurse or respiratory therapist will set it to a desired goal.  If possible, sit up straight or lean slightly forward. Try not to slouch.  Hold the incentive spirometer in an upright position. INSTRUCTIONS FOR USE  1. Sit on the edge of your bed if possible, or sit up as far as you can in bed or on a chair. 2. Hold the incentive spirometer in an upright position. 3. Breathe out normally. 4. Place the mouthpiece in your mouth and seal your lips tightly around it. 5. Breathe in slowly and as deeply as possible, raising the piston or the ball toward the top of the column. 6. Hold your breath for 3-5 seconds or for as long as possible. Allow the piston or ball to fall to the bottom of the column. 7. Remove the mouthpiece from your mouth and breathe out normally. 8. Rest for a few seconds and repeat Steps 1 through 7 at least 10 times every 1-2 hours when you are awake. Take your time and take a few normal breaths between deep breaths. 9. The spirometer may include an indicator to show your best effort. Use the indicator as a goal to work toward during each repetition. 10. After each set of 10 deep breaths, practice coughing to be sure your lungs are clear. If you have an incision (the cut made at the time of surgery), support your incision when coughing by placing a pillow or rolled up towels firmly against it. Once you are able to get out of bed, walk around indoors and cough well. You may stop  using the incentive  spirometer when instructed by your caregiver.  RISKS AND COMPLICATIONS  Take your time so you do not get dizzy or light-headed.  If you are in pain, you may need to take or ask for pain medication before doing incentive spirometry. It is harder to take a deep breath if you are having pain. AFTER USE  Rest and breathe slowly and easily.  It can be helpful to keep track of a log of your progress. Your caregiver can provide you with a simple table to help with this. If you are using the spirometer at home, follow these instructions: [place] IF:   You are having difficultly using the spirometer.  You have trouble using the spirometer as often as instructed.  Your pain medication is not giving enough relief while using the spirometer.  You develop fever of 100.5 F (38.1 C) or higher. SEEK IMMEDIATE MEDICAL CARE IF:   You cough up bloody sputum that had not been present before.  You develop fever of 102 F (38.9 C) or greater.  You develop worsening pain at or near the incision site. MAKE SURE YOU:   Understand these instructions.  Will watch your condition.  Will get help right away if you are not doing well or get worse. Document Released: 05/25/2006 Document Revised: 04/06/2011 Document Reviewed: 07/26/2006 ExitCare Patient Information 2014 ExitCare, [place].   ________________________________________________________________________  WHAT IS A BLOOD TRANSFUSION? Blood Transfusion Information  A transfusion is the replacement of blood or some of its parts. Blood is made up of multiple cells which provide different functions.  Red blood cells carry oxygen and are used for blood loss replacement.  White blood cells fight against infection.  Platelets control bleeding.  Plasma helps clot blood.  Other blood products are available for specialized needs, such as hemophilia or other clotting disorders. BEFORE THE TRANSFUSION  Who gives blood for transfusions?    Healthy volunteers who are fully evaluated to make sure their blood is safe. This is blood bank blood. Transfusion therapy is the safest it has ever been in the practice of medicine. Before blood is taken from a donor, a complete history is taken to make sure that person has no history of diseases nor engages in risky social behavior (examples are intravenous drug use or sexual activity with multiple partners). The donor's travel history is screened to minimize risk of transmitting infections, such as malaria. The donated blood is tested for signs of infectious diseases, such as HIV and hepatitis. The blood is then tested to be sure it is compatible with you in order to minimize the chance of a transfusion reaction. If you or a relative donates blood, this is often done in anticipation of surgery and is not appropriate for emergency situations. It takes many days to process the donated blood. RISKS AND COMPLICATIONS Although transfusion therapy is very safe and saves many lives, the main dangers of transfusion include:   Getting an infectious disease.  Developing a transfusion reaction. This is an allergic reaction to something in the blood you were given. Every precaution is taken to prevent this. The decision to have a blood transfusion has been considered carefully by your caregiver before blood is given. Blood is not given unless the benefits outweigh the risks. AFTER THE TRANSFUSION  Right after receiving a blood transfusion, you will usually feel much better and more energetic. This is especially true if your red blood cells have gotten low (anemic). The transfusion raises the level  of the red blood cells which carry oxygen, and this usually causes an energy increase.  The nurse administering the transfusion will monitor you carefully for complications. HOME CARE INSTRUCTIONS  No special instructions are needed after a transfusion. You may find your energy is better. Speak with your  caregiver about any limitations on activity for underlying diseases you may have. SEEK MEDICAL CARE IF:   Your condition is not improving after your transfusion.  You develop redness or irritation at the intravenous (IV) site. SEEK IMMEDIATE MEDICAL CARE IF:  Any of the following symptoms occur over the next 12 hours:  Shaking chills.  You have a temperature by mouth above 102 F (38.9 C), not controlled by medicine.  Chest, back, or muscle pain.  People around you feel you are not acting correctly or are confused.  Shortness of breath or difficulty breathing.  Dizziness and fainting.  You get a rash or develop hives.  You have a decrease in urine output.  Your urine turns a dark color or changes to pink, red, or brown. Any of the following symptoms occur over the next 10 days:  You have a temperature by mouth above 102 F (38.9 C), not controlled by medicine.  Shortness of breath.  Weakness after normal activity.  The white part of the eye turns yellow (jaundice).  You have a decrease in the amount of urine or are urinating less often.  Your urine turns a dark color or changes to pink, red, or brown. Document Released: 01/10/2000 Document Revised: 04/06/2011 Document Reviewed: 08/29/2007 [facility] Patient Information 2014 [place], [place].  _______________________________________________________________________

## 2017-04-08 ENCOUNTER — Encounter (HOSPITAL_COMMUNITY): Payer: Self-pay

## 2017-04-08 ENCOUNTER — Encounter (HOSPITAL_COMMUNITY)
Admission: RE | Admit: 2017-04-08 | Discharge: 2017-04-08 | Disposition: A | Discharge status: Home / Self Care | Payer: Medicare Other | Source: Ambulatory Visit | Attending: Gynecologic Oncology | Admitting: Gynecologic Oncology

## 2017-04-08 ENCOUNTER — Other Ambulatory Visit: Payer: Self-pay

## 2017-04-08 DIAGNOSIS — Z01812 Encounter for preprocedural laboratory examination: Secondary | ICD-10-CM | POA: Diagnosis present

## 2017-04-08 LAB — COMPREHENSIVE METABOLIC PANEL
ALK PHOS: 90 U/L (ref 38–126)
ALT: 45 U/L (ref 14–54)
AST: 39 U/L (ref 15–41)
Albumin: 3.1 g/dL — ABNORMAL LOW (ref 3.5–5.0)
Anion gap: 7 (ref 5–15)
BILIRUBIN TOTAL: 0.2 mg/dL — AB (ref 0.3–1.2)
BUN: 18 mg/dL (ref 6–20)
CALCIUM: 8.9 mg/dL (ref 8.9–10.3)
CO2: 31 mmol/L (ref 22–32)
CREATININE: 0.73 mg/dL (ref 0.44–1.00)
Chloride: 101 mmol/L (ref 101–111)
GFR calc Af Amer: >60 mL/min (ref >60)
Glucose, Bld: 215 mg/dL — ABNORMAL HIGH (ref 65–99)
Potassium: 4.2 mmol/L (ref 3.5–5.1)
Sodium: 139 mmol/L (ref 135–145)
TOTAL PROTEIN: 6.4 g/dL — AB (ref 6.5–8.1)

## 2017-04-08 LAB — CBC
HEMATOCRIT: 39.7 % (ref 36.0–46.0)
Hemoglobin: 12.8 g/dL (ref 12.0–15.0)
MCH: 28.5 pg (ref 26.0–34.0)
MCHC: 32.2 g/dL (ref 30.0–36.0)
MCV: 88.4 fL (ref 78.0–100.0)
Platelets: 249 10*3/uL (ref 150–400)
RBC: 4.49 MIL/uL (ref 3.87–5.11)
RDW: 13.5 % (ref 11.5–15.5)
WBC: 6.2 10*3/uL (ref 4.0–10.5)

## 2017-04-08 LAB — GLUCOSE, CAPILLARY: GLUCOSE-CAPILLARY: 225 mg/dL — AB (ref 65–99)

## 2017-04-08 LAB — URINALYSIS, ROUTINE W REFLEX MICROSCOPIC
Bilirubin Urine: NEGATIVE
Glucose, UA: >=500 mg/dL — AB
KETONES UR: 5 mg/dL — AB
Nitrite: NEGATIVE
Protein, ur: 30 mg/dL — AB
SPECIFIC GRAVITY, URINE: 1.03 (ref 1.005–1.030)
pH: 6 (ref 5.0–8.0)

## 2017-04-08 LAB — HEMOGLOBIN A1C
HEMOGLOBIN A1C: 7.3 % — AB (ref 4.8–5.6)
MEAN PLASMA GLUCOSE: 162.81 mg/dL

## 2017-04-08 LAB — ABO/RH: ABO/RH(D): O POS

## 2017-04-08 NOTE — Progress Notes (Signed)
Pt spoke to [facility], Diabetic Coordinator regarding the management of her insulin pump. Pt also has a scheduled appointment with her Endocrinologist prior to surgery.

## 2017-04-09 ENCOUNTER — Other Ambulatory Visit (HOSPITAL_COMMUNITY): Payer: Medicare Other

## 2017-04-09 ENCOUNTER — Telehealth: Payer: Self-pay | Admitting: *Deleted

## 2017-04-09 NOTE — Progress Notes (Signed)
04-08-17 UA result routed to Dr. Denman George for review.

## 2017-04-09 NOTE — Telephone Encounter (Signed)
Per result message from [facility] APP, called lab and spoke with Ovid Curd. Added a urine culture to the UA from yesterday. [name] APP placed orders.

## 2017-04-10 LAB — URINE CULTURE

## 2017-04-12 ENCOUNTER — Other Ambulatory Visit: Payer: Self-pay | Admitting: Family Medicine

## 2017-04-12 ENCOUNTER — Telehealth: Payer: Self-pay | Admitting: *Deleted

## 2017-04-12 NOTE — Telephone Encounter (Signed)
Patient called and stated " I just wanted to check with Dr. Denman George or Lenna Sciara, I am haivng surgery on March 21st. I have a cough and a little bit of a runny nose. Do I need to being taking anything? Will this stop my surgery. I'm not having any color to the runny nose and no fever." Advised the patient " Both Dr. Denman George and Lenna Sciara are out of the office. We will call you back tomorrow."

## 2017-04-13 NOTE — Telephone Encounter (Signed)
Per [name] APP the patient is fine to have surgery

## 2017-04-14 NOTE — Anesthesia Preprocedure Evaluation (Signed)
Anesthesia Evaluation  Patient identified by MRN, date of birth, ID band Patient awake    Reviewed: Allergy & Precautions, H&P , NPO status , Patient's Chart, lab work & pertinent test results, reviewed documented beta blocker date and time   Airway Mallampati: I  TM Distance: >3 FB Neck ROM: Full    Dental no notable dental hx. (+) Teeth Intact, Dental Advisory Given   Pulmonary neg pulmonary ROS,    Pulmonary exam normal breath sounds clear to auscultation       Cardiovascular hypertension, + dysrhythmias  Rhythm:Regular Rate:Normal     Neuro/Psych  Headaches, PSYCHIATRIC DISORDERS Depression    GI/Hepatic negative GI ROS, Neg liver ROS,   Endo/Other  diabetes, Type 1, Insulin Dependent  Renal/GU Renal InsufficiencyRenal disease  negative genitourinary   Musculoskeletal  (+) Fibromyalgia -  Abdominal   Peds negative pediatric ROS (+)  Hematology negative hematology ROS (+)   Anesthesia Other Findings   Reproductive/Obstetrics negative OB ROS                             Anesthesia Physical  Anesthesia Plan  ASA: III  Anesthesia Plan: General   Post-op Pain Management:    Induction: Intravenous  PONV Risk Score and Plan: 2 and Dexamethasone, Ondansetron and Treatment may vary due to age or medical condition  Airway Management Planned: Oral ETT  Additional Equipment:   Intra-op Plan:   Post-operative Plan: Extubation in OR  Informed Consent: I have reviewed the patients History and Physical, chart, labs and discussed the procedure including the risks, benefits and alternatives for the proposed anesthesia with the patient or authorized representative who has indicated his/her understanding and acceptance.   Dental advisory given  Plan Discussed with: CRNA  Anesthesia Plan Comments: (Continue insulin pump at reduced rate. Current BG 200's. Will recheck in PACU. )         Anesthesia Quick Evaluation

## 2017-04-15 ENCOUNTER — Ambulatory Visit (HOSPITAL_COMMUNITY)
Admission: RE | Admit: 2017-04-15 | Discharge: 2017-04-16 | Disposition: A | Discharge status: Home / Self Care | Payer: Medicare Other | Source: Ambulatory Visit | Attending: Gynecologic Oncology | Admitting: Gynecologic Oncology

## 2017-04-15 ENCOUNTER — Encounter (HOSPITAL_COMMUNITY): Payer: Self-pay

## 2017-04-15 ENCOUNTER — Encounter (HOSPITAL_COMMUNITY)
Admission: RE | Disposition: A | Discharge status: Home / Self Care | Payer: Self-pay | Source: Ambulatory Visit | Attending: Gynecologic Oncology

## 2017-04-15 ENCOUNTER — Other Ambulatory Visit: Payer: Self-pay

## 2017-04-15 ENCOUNTER — Ambulatory Visit (HOSPITAL_COMMUNITY): Payer: Medicare Other | Admitting: Anesthesiology

## 2017-04-15 DIAGNOSIS — Z791 Long term (current) use of non-steroidal anti-inflammatories (NSAID): Secondary | ICD-10-CM | POA: Diagnosis not present

## 2017-04-15 DIAGNOSIS — C541 Malignant neoplasm of endometrium: Secondary | ICD-10-CM | POA: Diagnosis not present

## 2017-04-15 DIAGNOSIS — Z79891 Long term (current) use of opiate analgesic: Secondary | ICD-10-CM | POA: Diagnosis not present

## 2017-04-15 DIAGNOSIS — M797 Fibromyalgia: Secondary | ICD-10-CM | POA: Diagnosis not present

## 2017-04-15 DIAGNOSIS — N83202 Unspecified ovarian cyst, left side: Secondary | ICD-10-CM | POA: Insufficient documentation

## 2017-04-15 DIAGNOSIS — E1065 Type 1 diabetes mellitus with hyperglycemia: Secondary | ICD-10-CM | POA: Insufficient documentation

## 2017-04-15 DIAGNOSIS — F329 Major depressive disorder, single episode, unspecified: Secondary | ICD-10-CM | POA: Diagnosis not present

## 2017-04-15 DIAGNOSIS — G4700 Insomnia, unspecified: Secondary | ICD-10-CM | POA: Diagnosis not present

## 2017-04-15 DIAGNOSIS — IMO0001 Reserved for inherently not codable concepts without codable children: Secondary | ICD-10-CM | POA: Diagnosis present

## 2017-04-15 DIAGNOSIS — Z794 Long term (current) use of insulin: Secondary | ICD-10-CM | POA: Insufficient documentation

## 2017-04-15 DIAGNOSIS — Z6841 Body Mass Index (BMI) 40.0 and over, adult: Secondary | ICD-10-CM | POA: Diagnosis not present

## 2017-04-15 DIAGNOSIS — N9489 Other specified conditions associated with female genital organs and menstrual cycle: Secondary | ICD-10-CM | POA: Insufficient documentation

## 2017-04-15 DIAGNOSIS — H9191 Unspecified hearing loss, right ear: Secondary | ICD-10-CM | POA: Insufficient documentation

## 2017-04-15 DIAGNOSIS — E1139 Type 2 diabetes mellitus with other diabetic ophthalmic complication: Secondary | ICD-10-CM

## 2017-04-15 DIAGNOSIS — Z9641 Presence of insulin pump (external) (internal): Secondary | ICD-10-CM | POA: Diagnosis not present

## 2017-04-15 DIAGNOSIS — I10 Essential (primary) hypertension: Secondary | ICD-10-CM | POA: Diagnosis not present

## 2017-04-15 DIAGNOSIS — Z79899 Other long term (current) drug therapy: Secondary | ICD-10-CM | POA: Insufficient documentation

## 2017-04-15 DIAGNOSIS — E10649 Type 1 diabetes mellitus with hypoglycemia without coma: Secondary | ICD-10-CM

## 2017-04-15 HISTORY — PX: ROBOTIC ASSISTED TOTAL HYSTERECTOMY WITH BILATERAL SALPINGO OOPHERECTOMY: SHX6086

## 2017-04-15 HISTORY — PX: LYMPH NODE BIOPSY: SHX201

## 2017-04-15 LAB — GLUCOSE, CAPILLARY
GLUCOSE-CAPILLARY: 304 mg/dL — AB (ref 65–99)
GLUCOSE-CAPILLARY: 126 mg/dL — AB (ref 65–99)
GLUCOSE-CAPILLARY: 126 mg/dL — AB (ref 65–99)
GLUCOSE-CAPILLARY: 129 mg/dL — AB (ref 65–99)
GLUCOSE-CAPILLARY: 153 mg/dL — AB (ref 65–99)
GLUCOSE-CAPILLARY: 180 mg/dL — AB (ref 65–99)
GLUCOSE-CAPILLARY: 165 mg/dL — AB (ref 65–99)
GLUCOSE-CAPILLARY: 175 mg/dL — AB (ref 65–99)
GLUCOSE-CAPILLARY: 54 mg/dL — AB (ref 65–99)
GLUCOSE-CAPILLARY: 73 mg/dL (ref 65–99)
Glucose-Capillary: 372 mg/dL — ABNORMAL HIGH (ref 65–99)
Glucose-Capillary: 142 mg/dL — ABNORMAL HIGH (ref 65–99)
Glucose-Capillary: 188 mg/dL — ABNORMAL HIGH (ref 65–99)
Glucose-Capillary: 244 mg/dL — ABNORMAL HIGH (ref 65–99)
Glucose-Capillary: 142 mg/dL — ABNORMAL HIGH (ref 65–99)
Glucose-Capillary: 96 mg/dL (ref 65–99)

## 2017-04-15 LAB — CBC
HEMATOCRIT: 37.3 % (ref 36.0–46.0)
Hemoglobin: 12.3 g/dL (ref 12.0–15.0)
MCH: 28.8 pg (ref 26.0–34.0)
MCHC: 33 g/dL (ref 30.0–36.0)
MCV: 87.4 fL (ref 78.0–100.0)
PLATELETS: 210 10*3/uL (ref 150–400)
RBC: 4.27 MIL/uL (ref 3.87–5.11)
RDW: 13.5 % (ref 11.5–15.5)
WBC: 8 10*3/uL (ref 4.0–10.5)

## 2017-04-15 LAB — TYPE AND SCREEN
ABO/RH(D): O POS
Antibody Screen: NEGATIVE

## 2017-04-15 LAB — CREATININE, SERUM: Creatinine, Ser: 0.72 mg/dL (ref 0.44–1.00)

## 2017-04-15 SURGERY — HYSTERECTOMY, TOTAL, ROBOT-ASSISTED, LAPAROSCOPIC, WITH BILATERAL SALPINGO-OOPHORECTOMY
Anesthesia: General

## 2017-04-15 MED ORDER — KETAMINE HCL 10 MG/ML IJ SOLN
INTRAMUSCULAR | Status: AC
  Filled 2017-04-15: qty 1

## 2017-04-15 MED ORDER — MIDAZOLAM HCL 5 MG/5ML IJ SOLN
INTRAMUSCULAR | Status: DC | PRN
  Administered 2017-04-15: 1 mg via INTRAVENOUS

## 2017-04-15 MED ORDER — POTASSIUM CHLORIDE IN NACL 20-0.9 MEQ/L-% IV SOLN
INTRAVENOUS | Status: DC
  Administered 2017-04-15: 15:00:00 via INTRAVENOUS
  Filled 2017-04-15 (×2): qty 1000

## 2017-04-15 MED ORDER — KETOROLAC TROMETHAMINE 15 MG/ML IJ SOLN: 15.0000 mg | Freq: Four times a day (QID) | INTRAMUSCULAR | Status: DC

## 2017-04-15 MED ORDER — SILVER NITRATE-POT NITRATE 75-25 % EX MISC
CUTANEOUS | Status: AC
  Filled 2017-04-15: qty 1

## 2017-04-15 MED ORDER — ALPRAZOLAM 0.5 MG PO TABS: 0.5000 mg | ORAL_TABLET | Freq: Every evening | ORAL | Status: DC | PRN

## 2017-04-15 MED ORDER — CEFAZOLIN SODIUM-DEXTROSE 2-4 GM/100ML-% IV SOLN
2.0000 g | INTRAVENOUS | Status: AC
  Administered 2017-04-15: 2 g via INTRAVENOUS
  Filled 2017-04-15: qty 100

## 2017-04-15 MED ORDER — ENOXAPARIN SODIUM 40 MG/0.4ML ~~LOC~~ SOLN
40.0000 mg | SUBCUTANEOUS | Status: DC
  Administered 2017-04-16: 40 mg via SUBCUTANEOUS
  Filled 2017-04-15: qty 0.4

## 2017-04-15 MED ORDER — DEXAMETHASONE SODIUM PHOSPHATE 4 MG/ML IJ SOLN: 4.0000 mg | INTRAMUSCULAR | Status: DC

## 2017-04-15 MED ORDER — GABAPENTIN 400 MG PO CAPS
400.0000 mg | ORAL_CAPSULE | Freq: Every day | ORAL | Status: DC
  Administered 2017-04-15: 400 mg via ORAL
  Filled 2017-04-15: qty 1

## 2017-04-15 MED ORDER — HYDROMORPHONE HCL 1 MG/ML IJ SOLN: 0.5000 mg | INTRAMUSCULAR | Status: DC | PRN

## 2017-04-15 MED ORDER — ONDANSETRON HCL 4 MG/2ML IJ SOLN
INTRAMUSCULAR | Status: AC
  Filled 2017-04-15: qty 2

## 2017-04-15 MED ORDER — ROCURONIUM BROMIDE 10 MG/ML (PF) SYRINGE
PREFILLED_SYRINGE | INTRAVENOUS | Status: AC
  Filled 2017-04-15: qty 5

## 2017-04-15 MED ORDER — OXYCODONE-ACETAMINOPHEN 5-325 MG PO TABS: 1.0000 | ORAL_TABLET | ORAL | Status: DC | PRN

## 2017-04-15 MED ORDER — LACTATED RINGERS IR SOLN
Status: DC | PRN
  Administered 2017-04-15: 1000 mL

## 2017-04-15 MED ORDER — EZETIMIBE 10 MG PO TABS
10.0000 mg | ORAL_TABLET | Freq: Every day | ORAL | Status: DC
  Administered 2017-04-15 – 2017-04-16 (×2): 10 mg via ORAL
  Filled 2017-04-15 (×2): qty 1

## 2017-04-15 MED ORDER — LIDOCAINE 2% (20 MG/ML) 5 ML SYRINGE
INTRAMUSCULAR | Status: AC
Start: 2017-04-15 — End: ?
  Filled 2017-04-15: qty 5

## 2017-04-15 MED ORDER — CELECOXIB 200 MG PO CAPS
400.0000 mg | ORAL_CAPSULE | ORAL | Status: AC
  Administered 2017-04-15: 400 mg via ORAL
  Filled 2017-04-15 (×2): qty 2

## 2017-04-15 MED ORDER — ONDANSETRON HCL 4 MG/2ML IJ SOLN: 4.0000 mg | Freq: Four times a day (QID) | INTRAMUSCULAR | Status: DC | PRN

## 2017-04-15 MED ORDER — STERILE WATER FOR INJECTION IJ SOLN
INTRAMUSCULAR | Status: AC
  Filled 2017-04-15: qty 10

## 2017-04-15 MED ORDER — TRAZODONE HCL 100 MG PO TABS
100.0000 mg | ORAL_TABLET | Freq: Every day | ORAL | Status: DC
  Administered 2017-04-15: 100 mg via ORAL
  Filled 2017-04-15: qty 1

## 2017-04-15 MED ORDER — ENSURE PRE-SURGERY PO LIQD
592.0000 mL | Freq: Once | ORAL | Status: DC
  Filled 2017-04-15: qty 592

## 2017-04-15 MED ORDER — INSULIN PUMP
Freq: Three times a day (TID) | SUBCUTANEOUS | Status: DC
  Administered 2017-04-15: 1.9 via SUBCUTANEOUS
  Filled 2017-04-15: qty 1

## 2017-04-15 MED ORDER — ROCURONIUM BROMIDE 50 MG/5ML IV SOSY
PREFILLED_SYRINGE | INTRAVENOUS | Status: DC | PRN
Start: 2017-04-15 — End: 2017-04-15
  Administered 2017-04-15 (×3): 10 mg via INTRAVENOUS
  Administered 2017-04-15: 5 mg via INTRAVENOUS
  Administered 2017-04-15: 10 mg via INTRAVENOUS
  Administered 2017-04-15: 50 mg via INTRAVENOUS

## 2017-04-15 MED ORDER — SUGAMMADEX SODIUM 200 MG/2ML IV SOLN
INTRAVENOUS | Status: DC | PRN
Start: 2017-04-15 — End: 2017-04-15
  Administered 2017-04-15: 200 mg via INTRAVENOUS

## 2017-04-15 MED ORDER — GABAPENTIN 300 MG PO CAPS: 300.0000 mg | ORAL_CAPSULE | ORAL | Status: DC

## 2017-04-15 MED ORDER — PROPOFOL 10 MG/ML IV BOLUS
INTRAVENOUS | Status: AC
  Filled 2017-04-15: qty 20

## 2017-04-15 MED ORDER — LIDOCAINE 2% (20 MG/ML) 5 ML SYRINGE
INTRAMUSCULAR | Status: DC | PRN
  Administered 2017-04-15: 100 mg via INTRAVENOUS

## 2017-04-15 MED ORDER — TRAMADOL HCL 50 MG PO TABS
50.0000 mg | ORAL_TABLET | Freq: Four times a day (QID) | ORAL | Status: DC
  Administered 2017-04-15 – 2017-04-16 (×4): 50 mg via ORAL
  Filled 2017-04-15 (×4): qty 1

## 2017-04-15 MED ORDER — ACETAMINOPHEN 500 MG PO TABS
1000.0000 mg | ORAL_TABLET | ORAL | Status: AC
  Administered 2017-04-15: 1000 mg via ORAL
  Filled 2017-04-15: qty 2

## 2017-04-15 MED ORDER — FENTANYL CITRATE (PF) 100 MCG/2ML IJ SOLN
25.0000 ug | INTRAMUSCULAR | Status: DC | PRN
  Administered 2017-04-15 (×2): 50 ug via INTRAVENOUS

## 2017-04-15 MED ORDER — STERILE WATER FOR IRRIGATION IR SOLN
Status: DC | PRN
  Administered 2017-04-15: 1000 mL

## 2017-04-15 MED ORDER — SCOPOLAMINE 1 MG/3DAYS TD PT72
1.0000 | MEDICATED_PATCH | TRANSDERMAL | Status: DC
  Administered 2017-04-15: 1.5 mg via TRANSDERMAL
  Filled 2017-04-15: qty 1

## 2017-04-15 MED ORDER — ACETAMINOPHEN 500 MG PO TABS
1000.0000 mg | ORAL_TABLET | Freq: Four times a day (QID) | ORAL | Status: AC
  Administered 2017-04-15 (×3): 1000 mg via ORAL
  Filled 2017-04-15 (×4): qty 2

## 2017-04-15 MED ORDER — LACTATED RINGERS IV SOLN
INTRAVENOUS | Status: DC | PRN
  Administered 2017-04-15 (×2): via INTRAVENOUS

## 2017-04-15 MED ORDER — INSULIN PUMP
Freq: Three times a day (TID) | SUBCUTANEOUS | Status: DC
  Administered 2017-04-15: 1.47 via SUBCUTANEOUS
  Administered 2017-04-16: 11:00:00 via SUBCUTANEOUS
  Filled 2017-04-15: qty 1

## 2017-04-15 MED ORDER — DULOXETINE HCL 60 MG PO CPEP
90.0000 mg | ORAL_CAPSULE | Freq: Every evening | ORAL | Status: DC
  Administered 2017-04-15: 90 mg via ORAL
  Filled 2017-04-15: qty 1

## 2017-04-15 MED ORDER — SUCCINYLCHOLINE CHLORIDE 200 MG/10ML IV SOSY
PREFILLED_SYRINGE | INTRAVENOUS | Status: AC
  Filled 2017-04-15: qty 10

## 2017-04-15 MED ORDER — FENTANYL CITRATE (PF) 100 MCG/2ML IJ SOLN
INTRAMUSCULAR | Status: AC
  Filled 2017-04-15: qty 2

## 2017-04-15 MED ORDER — LOSARTAN POTASSIUM 50 MG PO TABS
50.0000 mg | ORAL_TABLET | Freq: Every day | ORAL | Status: DC
Start: 2017-04-15 — End: 2017-04-16
  Administered 2017-04-15 – 2017-04-16 (×2): 50 mg via ORAL
  Filled 2017-04-15 (×2): qty 1

## 2017-04-15 MED ORDER — PROPOFOL 10 MG/ML IV BOLUS
INTRAVENOUS | Status: DC | PRN
  Administered 2017-04-15: 140 mg via INTRAVENOUS

## 2017-04-15 MED ORDER — GLYCOPYRROLATE 0.2 MG/ML IV SOSY
PREFILLED_SYRINGE | INTRAVENOUS | Status: AC
  Filled 2017-04-15: qty 5

## 2017-04-15 MED ORDER — DEXAMETHASONE SODIUM PHOSPHATE 10 MG/ML IJ SOLN
INTRAMUSCULAR | Status: AC
  Filled 2017-04-15: qty 1

## 2017-04-15 MED ORDER — HYDROCHLOROTHIAZIDE 12.5 MG PO CAPS
12.5000 mg | ORAL_CAPSULE | Freq: Every day | ORAL | Status: DC
  Administered 2017-04-15 – 2017-04-16 (×2): 12.5 mg via ORAL
  Filled 2017-04-15 (×2): qty 1

## 2017-04-15 MED ORDER — MIDAZOLAM HCL 2 MG/2ML IJ SOLN
INTRAMUSCULAR | Status: AC
  Filled 2017-04-15: qty 2

## 2017-04-15 MED ORDER — LOSARTAN POTASSIUM-HCTZ 50-12.5 MG PO TABS: 1.0000 | ORAL_TABLET | Freq: Every day | ORAL | Status: DC

## 2017-04-15 MED ORDER — LIDOCAINE 2% (20 MG/ML) 5 ML SYRINGE: INTRAMUSCULAR | Status: DC | PRN

## 2017-04-15 MED ORDER — STERILE WATER FOR INJECTION IJ SOLN
INTRAMUSCULAR | Status: DC | PRN
  Administered 2017-04-15: 10 mL

## 2017-04-15 MED ORDER — FENTANYL CITRATE (PF) 250 MCG/5ML IJ SOLN
INTRAMUSCULAR | Status: AC
  Filled 2017-04-15: qty 5

## 2017-04-15 MED ORDER — LIP MEDEX EX OINT
TOPICAL_OINTMENT | CUTANEOUS | Status: AC
  Filled 2017-04-15: qty 7

## 2017-04-15 MED ORDER — KETAMINE HCL 10 MG/ML IJ SOLN
INTRAMUSCULAR | Status: DC | PRN
  Administered 2017-04-15: 25 mg via INTRAVENOUS

## 2017-04-15 MED ORDER — ONDANSETRON HCL 4 MG PO TABS: 4.0000 mg | ORAL_TABLET | Freq: Four times a day (QID) | ORAL | Status: DC | PRN

## 2017-04-15 MED ORDER — GABAPENTIN 100 MG PO CAPS
200.0000 mg | ORAL_CAPSULE | ORAL | Status: DC
  Administered 2017-04-15 – 2017-04-16 (×2): 200 mg via ORAL
  Filled 2017-04-15 (×2): qty 2

## 2017-04-15 MED ORDER — FENTANYL CITRATE (PF) 100 MCG/2ML IJ SOLN
INTRAMUSCULAR | Status: DC | PRN
  Administered 2017-04-15: 100 ug via INTRAVENOUS
  Administered 2017-04-15 (×2): 50 ug via INTRAVENOUS

## 2017-04-15 MED ORDER — LIDOCAINE HCL 2 % IJ SOLN
INTRAMUSCULAR | Status: AC
  Filled 2017-04-15: qty 20

## 2017-04-15 MED ORDER — KETOROLAC TROMETHAMINE 15 MG/ML IJ SOLN
15.0000 mg | Freq: Four times a day (QID) | INTRAMUSCULAR | Status: DC
  Administered 2017-04-15 (×2): 15 mg via INTRAVENOUS
  Filled 2017-04-15 (×4): qty 1

## 2017-04-15 MED ORDER — LIDOCAINE 2% (20 MG/ML) 5 ML SYRINGE
INTRAMUSCULAR | Status: DC | PRN
  Administered 2017-04-15: 1.5 mg/kg/h via INTRAVENOUS

## 2017-04-15 MED ORDER — GLUCAGON (RDNA) 1 MG IJ KIT: 1.0000 mg | PACK | Freq: Once | INTRAMUSCULAR | Status: DC

## 2017-04-15 MED ORDER — ONDANSETRON HCL 4 MG/2ML IJ SOLN
INTRAMUSCULAR | Status: DC | PRN
  Administered 2017-04-15: 4 mg via INTRAVENOUS

## 2017-04-15 MED ORDER — GLYCOPYRROLATE 0.2 MG/ML IJ SOLN
INTRAMUSCULAR | Status: DC | PRN
  Administered 2017-04-15: 0.2 mg via INTRAVENOUS

## 2017-04-15 MED ORDER — ENOXAPARIN SODIUM 40 MG/0.4ML ~~LOC~~ SOLN
40.0000 mg | SUBCUTANEOUS | Status: AC
  Administered 2017-04-15: 40 mg via SUBCUTANEOUS
  Filled 2017-04-15: qty 0.4

## 2017-04-15 SURGICAL SUPPLY — 53 items
ADH SKN CLS APL DERMABOND .7 (GAUZE/BANDAGES/DRESSINGS) ×2
AGENT HMST KT MTR STRL THRMB (HEMOSTASIS)
APL ESCP 34 STRL LF DISP (HEMOSTASIS)
APPLICATOR SURGIFLO ENDO (HEMOSTASIS) IMPLANT
BAG LAPAROSCOPIC 12 15 PORT 16 (BASKET) IMPLANT
BAG RETRIEVAL 12/15 (BASKET)
BAG SPEC RTRVL LRG 6X4 10 (ENDOMECHANICALS) ×6
COVER BACK TABLE 60X90IN (DRAPES) ×3 IMPLANT
COVER TIP SHEARS 8 DVNC (MISCELLANEOUS) ×2 IMPLANT
COVER TIP SHEARS 8MM DA VINCI (MISCELLANEOUS) ×1
DERMABOND ADVANCED (GAUZE/BANDAGES/DRESSINGS) ×1
DERMABOND ADVANCED .7 DNX12 (GAUZE/BANDAGES/DRESSINGS) ×2 IMPLANT
DRAPE ARM DVNC X/XI (DISPOSABLE) ×8 IMPLANT
DRAPE COLUMN DVNC XI (DISPOSABLE) ×2 IMPLANT
DRAPE DA VINCI XI ARM (DISPOSABLE) ×4
DRAPE DA VINCI XI COLUMN (DISPOSABLE) ×1
DRAPE SHEET LG 3/4 BI-LAMINATE (DRAPES) ×3 IMPLANT
DRAPE SURG IRRIG POUCH 19X23 (DRAPES) ×3 IMPLANT
DRSG TELFA 3X8 NADH (GAUZE/BANDAGES/DRESSINGS) ×3 IMPLANT
ELECT BLADE TIP CTD 4 INCH (ELECTRODE) ×1 IMPLANT
ELECT REM PT RETURN 15FT ADLT (MISCELLANEOUS) ×3 IMPLANT
GAUZE SPONGE 4X4 16PLY XRAY LF (GAUZE/BANDAGES/DRESSINGS) ×1 IMPLANT
GLOVE BIO SURGEON STRL SZ 6 (GLOVE) ×12 IMPLANT
GLOVE BIO SURGEON STRL SZ 6.5 (GLOVE) ×6 IMPLANT
GOWN STRL REUS W/ TWL LRG LVL3 (GOWN DISPOSABLE) ×4 IMPLANT
GOWN STRL REUS W/TWL LRG LVL3 (GOWN DISPOSABLE) ×6
HOLDER FOLEY CATH W/STRAP (MISCELLANEOUS) ×3 IMPLANT
IRRIG SUCT STRYKERFLOW 2 WTIP (MISCELLANEOUS) ×3
IRRIGATION SUCT STRKRFLW 2 WTP (MISCELLANEOUS) ×2 IMPLANT
KIT PROCEDURE DA VINCI SI (MISCELLANEOUS) ×1
KIT PROCEDURE DVNC SI (MISCELLANEOUS) IMPLANT
MANIPULATOR UTERINE 4.5 ZUMI (MISCELLANEOUS) ×3 IMPLANT
NDL SPNL 18GX3.5 QUINCKE PK (NEEDLE) ×2 IMPLANT
NEEDLE SPNL 18GX3.5 QUINCKE PK (NEEDLE) ×3 IMPLANT
OBTURATOR OPTICAL STANDARD 8MM (TROCAR) ×1
OBTURATOR OPTICAL STND 8 DVNC (TROCAR) ×2
OBTURATOR OPTICALSTD 8 DVNC (TROCAR) ×2 IMPLANT
PACK ROBOT GYN CUSTOM WL (TRAY / TRAY PROCEDURE) ×3 IMPLANT
PAD DRESSING TELFA 3X8 NADH (GAUZE/BANDAGES/DRESSINGS) IMPLANT
PAD POSITIONING PINK XL (MISCELLANEOUS) ×3 IMPLANT
PENCIL HANDSWITCHING (ELECTRODE) ×1 IMPLANT
POUCH SPECIMEN RETRIEVAL 10MM (ENDOMECHANICALS) ×3 IMPLANT
SEAL CANN UNIV 5-8 DVNC XI (MISCELLANEOUS) ×8 IMPLANT
SEAL XI 5MM-8MM UNIVERSAL (MISCELLANEOUS) ×4
SET TRI-LUMEN FLTR TB AIRSEAL (TUBING) ×3 IMPLANT
SURGIFLO W/THROMBIN 8M KIT (HEMOSTASIS) IMPLANT
SUT VIC AB 0 CT1 27 (SUTURE)
SUT VIC AB 0 CT1 27XBRD ANTBC (SUTURE) IMPLANT
SYR 10ML LL (SYRINGE) ×3 IMPLANT
TOWEL OR NON WOVEN STRL DISP B (DISPOSABLE) ×3 IMPLANT
TRAP SPECIMEN MUCOUS 40CC (MISCELLANEOUS) IMPLANT
TRAY FOLEY W/METER SILVER 16FR (SET/KITS/TRAYS/PACK) ×3 IMPLANT
UNDERPAD 30X30 (UNDERPADS AND DIAPERS) ×3 IMPLANT

## 2017-04-15 NOTE — Progress Notes (Signed)
Persistent R ear deafness. This is a post-op occurrence that I have never heard of; neither of the other two anesthesiologist have any idea either. ( KO&CG)  There is not an otoscope available to check her ears, If persistent thru the am, would ask ENT to weigh in. I have seen this with intra-op N2O use and ruptured ear drum, but this was not part of the anesthetic.  Trendelenburg ??

## 2017-04-15 NOTE — Op Note (Signed)
OPERATIVE NOTE 04/15/17  Surgeon: Donaciano Eva   Assistants: Dr Lahoma Crocker (an MD assistant was necessary for tissue manipulation, management of robotic instrumentation, retraction and positioning due to the complexity of the case and hospital policies).   Anesthesia: General endotracheal anesthesia  ASA Class: 3   Pre-operative Diagnosis: endometrial cancer grade 3  Post-operative Diagnosis: same,   Operation: Robotic-assisted laparoscopic total hysterectomy with left salpingoophorectomy, SLN biopsy, para-aortic lymphadenectomy (bilateral), vaginal biopsy.  Surgeon: Donaciano Eva  Assistant Surgeon: Lahoma Crocker MD  Anesthesia: GET  Urine Output: 300cc  Operative Findings:  : 61mm nodule on left proximal (upper) vaginal side wall (benign on frozen). 8cm normal appearing uterus, surgically absent right ovary and tube, normal left ovary somewhat adherent to round ligament. Indurated left external iliac SLN. No other suspicious nodes or apparent extrauterine disease.   Estimated Blood Loss:  less than 100 mL      Total IV Fluids: 900 ml         Specimens: uterus, cervix, lefttubes and ovaries, left upper vaginal side wall biopsy, right presacral SLN 1 and 2, left external iliac SLN, right and left para-aortic lymph nodes.         Complications:  None; patient tolerated the procedure well.         Disposition: PACU - hemodynamically stable.  Procedure Details  The patient was seen in the Holding Room. The risks, benefits, complications, treatment options, and expected outcomes were discussed with the patient.  The patient concurred with the proposed plan, giving informed consent.  The site of surgery properly noted/marked. The patient was identified as Katie Palmer and the procedure verified as a Robotic-assisted hysterectomy with bilateral salpingo oophorectomy with SLN biopsy. A Time Out was held and the above information confirmed.  After induction  of anesthesia, the patient was draped and prepped in the usual sterile manner. Pt was placed in supine position after anesthesia and draped and prepped in the usual sterile manner. The abdominal drape was placed after the CholoraPrep had been allowed to dry for 3 minutes.  Her arms were tucked to her side with all appropriate precautions.  The shoulders were stabilized with padded shoulder blocks applied to the acromium processes.  The patient was placed in the semi-lithotomy position in Eros.  The perineum was prepped with Betadine. The patient was then prepped. Foley catheter was placed.  On bimanual exam a nodule on the left upper vagina measuring 12mm was palpated. This was excised with a scalpel and biopsy forcep and sent for frozen (benign). Hemostasis was achieved with the bovie. A sterile speculum was placed in the vagina.  The cervix was grasped with a single-tooth tenaculum. 2mg  total of ICG was injected into the cervical stroma at 2 and 9 o'clock with 1cc injected at a 1cm and 89mm depth (concentration 0.5mg /ml) in all locations. The cervix was dilated with Kennon Rounds dilators.  The ZUMI uterine manipulator with a medium colpotomizer ring was placed without difficulty.  A pneum occluder balloon was placed over the manipulator.  OG tube placement was confirmed and to suction.   Next, a 5 mm skin incision was made 1 cm below the subcostal margin in the midclavicular line.  The 5 mm Optiview port and scope was used for direct entry.  Opening pressure was under 10 mm CO2.  The abdomen was insufflated and the findings were noted as above.   At this point and all points during the procedure, the patient's intra-abdominal pressure did  not exceed 15 mmHg. Next, a 10 mm skin incision was made in the umbilicus and a right and left port was placed about 10 cm lateral to the robot port on the right and left side.  A fourth arm was placed in the left lower quadrant 2 cm above and superior and medial to the  anterior superior iliac spine.  All ports were placed under direct visualization.  The patient was placed in steep Trendelenburg.  Bowel was folded away into the upper abdomen.  The robot was docked in the normal manner.  The right and left peritoneum were opened parallel to the IP ligament to open the retroperitoneal spaces bilaterally. The SLN mapping was performed in bilateral pelvic basins. The para rectal and paravesical spaces were opened up entirely with careful dissection below the level of the ureters bilaterally and to the depth of the uterine artery origin in order to skeletonize the uterine "web" and ensure visualization of all parametrial channels. The para-aortic basins were carefully exposed and evaluated for isolated para-aortic SLN's. Lymphatic channels were identified travelling to the following visualized sentinel lymph node's: right pre-sacral 1 and 2, left external iliac (somewhat indurated). These SLN's were separated from their surrounding lymphatic tissue, removed and sent for permanent pathology.  Due to her deeply invasive grade 3 endometrioid we proceeded with para-aortic node dissection.  The para-aortic lymph node dissection was performed first on the right. The peritoneum overlying the lateral surface of the common iliac and vena cava was opened vertically towards the duodenum. It was elevated as a shelf and the ureter was identified in this retroperitoneum. It was mobilized and retracted laterally with the 4th arm. The right para-aortic dissection took place by separating an enbloc segment of node containing fat from the mid portion of the common iliac distally, the retroperitoneal duodenum superiorally, the genitofemoral nerve laterally and the aorta medially.  Hemostasis was confirmed and was reinforced with Floseal.  The left para-aortic lymphadenectomy was performed by elevating the peritoneal edge and mobilizing the sigmoid mesentery from the underlying aorta. The left  para-aortic retroperitoneal space (between the sigmoid mesentery and the lymph node bundle) was developed and the left ureter was identified and retracted with the forth arm. The left para-aortic nodes were removed en bloc by using monopolar and sharp dissection to separate all nodal fatty tissue between the mid portion of the left common iliac artery inferiorally and the IMA superiorally, the aorta medially, the vertebral bodies posteriorally and the tendon of the psoas muscle laterally.  The hysterectomy was started after the round ligament on the right side was incised and the retroperitoneum was entered and the pararectal space was developed.  The ureter was noted to be on the medial leaf of the broad ligament.  The peritoneum above the ureter was incised and stretched and the residual infundibulopelvic ligament was skeletonized, cauterized and cut.  The posterior peritoneum was taken down to the level of the KOH ring.  The anterior peritoneum was also taken down.  The bladder flap was created to the level of the KOH ring.  The uterine artery on the right side was skeletonized, cauterized and cut in the normal manner.  A similar procedure was performed on the left.  The colpotomy was made and the uterus, cervix, left ovary and tubes were amputated and delivered through the vagina.  Pedicles were inspected and excellent hemostasis was achieved.    The colpotomy at the vaginal cuff was closed with Vicryl on a CT1 needle in  running manner.  Irrigation was used and excellent hemostasis was achieved.  At this point in the procedure was completed.  Robotic instruments were removed under direct visulaization.  The robot was undocked. The 10 mm ports were closed with Vicryl on a UR-5 needle and the fascia was closed with 0 Vicryl on a UR-5 needle.  The skin was closed with 4-0 Vicryl in a subcuticular manner.  Dermabond was applied.  Sponge, lap and needle counts correct x 2.  The patient was taken to the recovery  room in stable condition.  The vagina was swabbed with  minimal bleeding noted.   All instrument and needle counts were correct x  3.   The patient was transferred to the recovery room in stable condition.  Donaciano Eva, MD

## 2017-04-15 NOTE — Interval H&P Note (Signed)
History and Physical Interval Note:  04/15/2017 7:22 AM  Katie Palmer  has presented today for surgery, with the diagnosis of ENDOMETRIAL CANCER  The various methods of treatment have been discussed with the patient and family. After consideration of risks, benefits and other options for treatment, the patient has consented to  Procedure(s): XI ROBOTIC ASSISTED TOTAL;LAPASCOPIC  HYSTERECTOMY WITH BILATERAL SALPINGO OOPHORECTOMY (Bilateral) SENTINEL LYMPH NODE BIOPSY (N/A) as a surgical intervention .  The patient's history has been reviewed, patient examined, no change in status, stable for surgery.  I have reviewed the patient's chart and labs. CT preop did not show apparent extrauterine disease. Questions were answered to the patient's satisfaction.     Thereasa Solo

## 2017-04-15 NOTE — Anesthesia Procedure Notes (Signed)
Date/Time: 04/15/2017 10:35 AM Performed by: Maxwell Caul, CRNA Oxygen Delivery Method: Simple face mask Placement Confirmation: breath sounds checked- equal and bilateral and ETT inserted through vocal cords under direct vision Dental Injury: Teeth and Oropharynx as per pre-operative assessment

## 2017-04-15 NOTE — Transfer of Care (Signed)
Immediate Anesthesia Transfer of Care Note  Patient: Katie Palmer  Procedure(s) Performed: XI ROBOTIC ASSISTED TOTAL LAPROSCOPIC  HYSTERECTOMY WITH LEFT SALPINGO OOPHORECTOMY (Bilateral ) SENTINEL LYMPH NODE BIOPSY, PERIAORTIC LYMPH NODES, AND VAGINAL BIOPSY (N/A )  Patient Location: PACU  Anesthesia Type:General  Level of Consciousness: sedated  Airway & Oxygen Therapy: Patient Spontanous Breathing and Patient connected to face mask oxygen  Post-op Assessment: Report given to RN and Post -op Vital signs reviewed and stable  Post vital signs: Reviewed and stable  Last Vitals:  Vitals Value Taken Time  BP    Temp    Pulse 81 04/15/2017 10:45 AM  Resp 21 04/15/2017 10:45 AM  SpO2 97 % 04/15/2017 10:45 AM  Vitals shown include unvalidated device data.  Last Pain:  Vitals:   04/15/17 0602  TempSrc:   PainSc: 0-No pain         Complications: No apparent anesthesia complications

## 2017-04-15 NOTE — Anesthesia Procedure Notes (Signed)
Procedure Name: Intubation Date/Time: 04/15/2017 7:48 AM Performed by: Victoriano Lain, CRNA Pre-anesthesia Checklist: Patient identified, Emergency Drugs available, Suction available and Patient being monitored Patient Re-evaluated:Patient Re-evaluated prior to induction Oxygen Delivery Method: Circle system utilized Preoxygenation: Pre-oxygenation with 100% oxygen Induction Type: IV induction Ventilation: Mask ventilation without difficulty Laryngoscope Size: Mac and 4 Grade View: Grade I Tube type: Oral Tube size: 7.0 mm Number of attempts: 1 Airway Equipment and Method: Stylet Placement Confirmation: ETT inserted through vocal cords under direct vision,  positive ETCO2 and breath sounds checked- equal and bilateral Secured at: 21 cm Tube secured with: Tape Dental Injury: Teeth and Oropharynx as per pre-operative assessment  Comments: DL by Darrell Jewel, SRNA

## 2017-04-15 NOTE — Progress Notes (Signed)
Spoke with patient briefly following her surgery and back in room. Patient has a T-Slim insulin pump. Was not able to give me her basal rates at this time, but states that she would when feeling better. Patient alert X3 and can handle own pump. Spoke with staff RN and patient has signed patient contract and answered questions on flow sheet concerning the insulin pump management. Will continue to follow while in the hospital.  Harvel Ricks RN BSN CDE Diabetes Coordinator Pager: [phone]  8am-5pm

## 2017-04-15 NOTE — Progress Notes (Signed)
Notified Dr. Lyndle Herrlich of patient C/O being unable to hear from Right ear.  States he is "tied up" right now and ok to let her go.

## 2017-04-16 ENCOUNTER — Encounter (HOSPITAL_COMMUNITY): Payer: Self-pay | Admitting: Gynecologic Oncology

## 2017-04-16 DIAGNOSIS — H9191 Unspecified hearing loss, right ear: Secondary | ICD-10-CM | POA: Diagnosis not present

## 2017-04-16 DIAGNOSIS — C541 Malignant neoplasm of endometrium: Secondary | ICD-10-CM | POA: Diagnosis not present

## 2017-04-16 LAB — BASIC METABOLIC PANEL
ANION GAP: 6 (ref 5–15)
BUN: 14 mg/dL (ref 6–20)
CO2: 30 mmol/L (ref 22–32)
Calcium: 8.4 mg/dL — ABNORMAL LOW (ref 8.9–10.3)
Chloride: 105 mmol/L (ref 101–111)
Creatinine, Ser: 0.82 mg/dL (ref 0.44–1.00)
GFR calc Af Amer: >60 mL/min (ref >60)
GLUCOSE: 136 mg/dL — AB (ref 65–99)
POTASSIUM: 4.2 mmol/L (ref 3.5–5.1)
Sodium: 141 mmol/L (ref 135–145)

## 2017-04-16 LAB — CBC
HEMATOCRIT: 34.6 % — AB (ref 36.0–46.0)
HEMOGLOBIN: 11 g/dL — AB (ref 12.0–15.0)
MCH: 28.2 pg (ref 26.0–34.0)
MCHC: 31.8 g/dL (ref 30.0–36.0)
MCV: 88.7 fL (ref 78.0–100.0)
Platelets: 207 10*3/uL (ref 150–400)
RBC: 3.9 MIL/uL (ref 3.87–5.11)
RDW: 13.7 % (ref 11.5–15.5)
WBC: 6.3 10*3/uL (ref 4.0–10.5)

## 2017-04-16 LAB — GLUCOSE, CAPILLARY
Glucose-Capillary: 71 mg/dL (ref 65–99)
Glucose-Capillary: 112 mg/dL — ABNORMAL HIGH (ref 65–99)
Glucose-Capillary: 99 mg/dL (ref 65–99)

## 2017-04-16 MED ORDER — TRAMADOL HCL 50 MG PO TABS: 50.0000 mg | ORAL_TABLET | Freq: Four times a day (QID) | ORAL | 0 refills | Status: DC | PRN

## 2017-04-16 NOTE — Discharge Summary (Signed)
Physician Discharge Summary  Patient ID: Katie Palmer MRN: [id number] DOB/AGE: [birth year] 69 y.o.  Admit date: 04/15/2017 Discharge date: 04/16/2017  Admission Diagnoses: Endometrial adenocarcinoma [facility])  Discharge Diagnoses:  Principal Problem:   Endometrial adenocarcinoma ([place]) Active Problems:   Diabetes mellitus type 1, uncontrolled ([place])   Obesity, morbid ([place])   Endometrial cancer ([place])   Hearing loss in right ear   Discharged Condition:  The patient is in good condition and stable for discharge.   Hospital Course: On 04/15/2017, the patient underwent the following: Procedure(s): XI ROBOTIC ASSISTED TOTAL LAPROSCOPIC  HYSTERECTOMY WITH LEFT SALPINGO OOPHORECTOMY SENTINEL LYMPH NODE BIOPSY, PERIAORTIC LYMPH NODES, AND VAGINAL BIOPSY.  The postoperative course was uneventful.  She was discharged to home on postoperative day 1 tolerating a regular diet, ambulating, voiding, pain controlled, hearing loss in right ear stable.  She is to follow up with Dr. Redmond Baseman in the outpatient setting to evaluate right ear hearing loss that developed post-operatively.  His office is to contact the patient.  Consults: ENT consult-to be performed outpatient per Dr. Redmond Baseman  Significant Diagnostic Studies: None  Treatments: surgery: see above  Discharge Exam (from AM assessment): Blood pressure 117/69, pulse 63, temperature 98.8 F (37.1 C), temperature source Oral, resp. rate 18, height 5\' 5"  (1.651 m), weight 241 lb 6.5 oz (109.5 kg), SpO2 96 %. General appearance: alert, cooperative and no distress Resp: clear to auscultation bilaterally Cardio: regular rate and rhythm, S1, S2 normal, no murmur, click, rub or gallop GI: soft, non-tender; bowel sounds normal; no masses,  no organomegaly Extremities: extremities normal, atraumatic, no cyanosis or edema Incision/Wound: abdomen with lap sites with dermabond with mild ecchymosis, no active drainage or bleeding  Otoscope examination performed  without difficulty. Pt tolerated well. Left ear with moderate amount of dried blood in the canal.  Moderate amount of dark red blood noted behind the tympanic membrane.  Right ear with minimal amount of blood present behind the tympanic membrane with no blood noted in the canal Scopalamine patch present behind right ear  Disposition: Discharge disposition: 01-Home or Self Care      Discharge Instructions    Call MD for:  difficulty breathing, headache or visual disturbances   Complete by:  As directed    Call MD for:  extreme fatigue   Complete by:  As directed    Call MD for:  hives   Complete by:  As directed    Call MD for:  persistant dizziness or light-headedness   Complete by:  As directed    Call MD for:  persistant nausea and vomiting   Complete by:  As directed    Call MD for:  redness, tenderness, or signs of infection (pain, swelling, redness, odor or green/yellow discharge around incision site)   Complete by:  As directed    Call MD for:  severe uncontrolled pain   Complete by:  As directed    Call MD for:  temperature >100.4   Complete by:  As directed    Diet - low sodium heart healthy   Complete by:  As directed    Driving Restrictions   Complete by:  As directed    No driving for 1 week.  Do not take narcotics and drive.   Increase activity slowly   Complete by:  As directed    Lifting restrictions   Complete by:  As directed    No lifting greater than 10 lbs.   Sexual Activity Restrictions   Complete by:  As directed    No sexual activity, nothing in the vagina, for 8 weeks.     Allergies as of 04/16/2017      Reactions   Armodafinil    Flu-like symptoms   Invokana [canagliflozin] Other (See Comments)   "shut down pancreas"    Lasix [furosemide] Diarrhea   Severe diarrhea      Medication List    TAKE these medications   ALPRAZolam 1 MG tablet Commonly known as:  XANAX TAKE 1/2 TO 1 TABLET BY MOUTH AT BEDTIME AS NEEDED FOR ANXIETY   DULoxetine  30 MG capsule Commonly known as:  CYMBALTA Take 90 mg by mouth every evening.   ezetimibe 10 MG tablet Commonly known as:  ZETIA Take 10 mg by mouth daily.   gabapentin 300 MG capsule Commonly known as:  NEURONTIN Take 400 mg by mouth at bedtime.   gabapentin 100 MG capsule Commonly known as:  NEURONTIN Take 200 mg by mouth 2 (two) times daily.   GLUCAGON EMERGENCY 1 MG injection Generic drug:  glucagon Reported on 06/03/2015   losartan-hydrochlorothiazide 50-12.5 MG tablet Commonly known as:  HYZAAR TAKE 1 TABLET BY MOUTH EVERY DAY   NOVOLOG 100 UNIT/ML injection Generic drug:  insulin aspart UNITS BY PUMP DEPENDS ON SUGAR READINGS, SLIDING SCALE   SUMAtriptan 100 MG tablet Commonly known as:  IMITREX TAKE 1 TABLET BY MOUTH AS NEEDED FOR MIGRAINES. MAY REPEAT IN 2 HOURS IF NEEDED   traMADol 50 MG tablet Commonly known as:  ULTRAM Take 1 tablet (50 mg total) by mouth every 6 (six) hours as needed. What changed:  See the new instructions.   traZODone 100 MG tablet Commonly known as:  DESYREL Take 1 tablet by mouth at bedtime.   VITAMIN D2 PO Take 50,000 Units by mouth every 7 (seven) days. Ergocalciferol Vitalmin D2 50,000 u capsule  SUNDAYS      Follow-up Information    Everitt Amber, MD In 3 weeks.   Specialty:  Obstetrics and Gynecology Contact information: [address] [place] [address] [phone]        with ENT. Dr. Redmond Baseman office to call Follow up.           Greater than thirty minutes were spend for face to face discharge instructions and discharge orders/summary in EPIC.   Signed: Dorothyann Gibbs 04/16/2017, 2:07 PM

## 2017-04-16 NOTE — Discharge Instructions (Signed)
04/15/2017  Return to work: 4 weeks  Activity: 1. Be up and out of the bed during the day.  Take a nap if needed.  You may walk up steps but be careful and use the hand rail.  Stair climbing will tire you more than you think, you may need to stop part way and rest.   2. No lifting or straining for 6 weeks.  3. No driving for 1 weeks.  Do Not drive if you are taking narcotic pain medicine.  4. Shower daily.  Use soap and water on your incision and pat dry; don't rub.   5. No sexual activity and nothing in the vagina for 8 weeks.  Medications:  - Take ibuprofen and tylenol first line for pain control. Take these regularly (every 6 hours) to decrease the build up of pain.  - If necessary, for severe pain not relieved by ibuprofen, take tramadol.  - While taking tramadol you should take sennakot every night to reduce the likelihood of constipation. If this causes diarrhea, stop its use.  Diet: 1. Low sodium Heart Healthy Diet is recommended.  2. It is safe to use a laxative if you have difficulty moving your bowels.   Wound Care: 1. Keep clean and dry.  Shower daily.  Reasons to call the Doctor:   Fever - Oral temperature greater than 100.4 degrees Fahrenheit  Foul-smelling vaginal discharge  Difficulty urinating  Nausea and vomiting  Increased pain at the site of the incision that is unrelieved with pain medicine.  Difficulty breathing with or without chest pain  New calf pain especially if only on one side  Sudden, continuing increased vaginal bleeding with or without clots.   Follow-up: 1. See Everitt Amber in 3 weeks.  2. Dr. Redmond Baseman' office to call to arrange for an appointment outpatient for right ear evaluation. Contacts: For questions or concerns you should contact:  Dr. Everitt Amber at [phone] After hours and on week-ends call [phone] and ask to speak to the physician on call for Gynecologic Oncology

## 2017-04-16 NOTE — Progress Notes (Signed)
1 Day Post-Op Procedure(s) (LRB): XI ROBOTIC ASSISTED TOTAL LAPROSCOPIC  HYSTERECTOMY WITH LEFT SALPINGO OOPHORECTOMY (Bilateral) SENTINEL LYMPH NODE BIOPSY, PERIAORTIC LYMPH NODES, AND VAGINAL BIOPSY (N/A)  Subjective: Patient reports feeling well except for hearing issue with the right ear.  She states she realized the change in her hearing when she woke up after surgery and states it has not worsened or improved.  She reports noting dried blood in her left ear as well.  She states she feels like she is in a tunnel when attempting to hear with her right ear and left ear covered.  Tolerating regular food with no nausea or emesis reported.  Ambulating without difficulty with assist.  Voiding since foley removal.  Minimal pain reported.  States she has taken tramadol in the past and tolerated well.  Denies history of hearing issues or ear surgery with either ear.  Denies headache or vision changes.  Denies chest pain, dyspnea. No other concerns voiced.    Objective: Vital signs in last 24 hours: Temp:  [97.9 F (36.6 C)-98.4 F (36.9 C)] 98.2 F (36.8 C) (03/22 0444) Pulse Rate:  [59-79] 66 (03/22 1112) Resp:  [14-20] 18 (03/22 0444) BP: (93-132)/(55-79) 112/60 (03/22 1112) SpO2:  [93 %-100 %] 97 % (03/22 0444) Weight:  [241 lb 6.5 oz (109.5 kg)] 241 lb 6.5 oz (109.5 kg) (03/22 0444) Last BM Date: 04/14/17  Intake/Output from previous day: 03/21 0701 - 03/22 0700 In: 2863.3 [P.O.:600; I.V.:2263.3] Out: 1875 [Urine:1775; Blood:100]  Physical Examination: General: alert, cooperative and no distress Resp: clear to auscultation bilaterally Cardio: regular rate and rhythm, S1, S2 normal, no murmur, click, rub or gallop GI: soft, non-tender; bowel sounds normal; no masses,  no organomegaly and incision: lap sites to the abdomen with mild ecchymosis present, no active drainage or bleeding Extremities: extremities normal, atraumatic, no cyanosis or edema Otoscope examination performed without  difficulty. Pt tolerated well. Left ear with moderate amount of dried blood in the canal.  Moderate amount of dark red blood noted behind the tympanic membrane.  Right ear with minimal amount of blood present behind the tympanic membrane with no blood noted in the canal  Labs: WBC/Hgb/Hct/Plts:  6.3/11.0/34.6/207 (03/22 0411) BUN/Cr/glu/ALT/AST/amyl/lip:  14/0.82/--/--/--/--/-- (03/22 0411)  Assessment: 67 y.o. s/p Procedure(s): XI ROBOTIC ASSISTED TOTAL LAPROSCOPIC  HYSTERECTOMY WITH LEFT SALPINGO OOPHORECTOMY SENTINEL LYMPH NODE BIOPSY, PERIAORTIC LYMPH NODES, AND VAGINAL BIOPSY: stable Pain:  Pain is well-controlled on PRN medications.  Heme: Hgb 11.0 and Hct 34.6 this am.  Stable post-operatively.  CV: BP and HR stable post-op.  Continue to monitor with ordered vital signs.  GI:  Tolerating po: Yes     GU: Voiding adequately since foley removal.    FEN: Stable post-operatively.  Endo: Diabetes mellitus Type II, under good control..  CBG:  CBG (last 3)  Recent Labs    04/16/17 0130 04/16/17 0753 04/16/17 1104  GLUCAP 71 112* 99  Insulin pump in place and pt managing well   Prophylaxis: intermittent pneumatic compression boots and lovenox.  Plan: ENT consult.  [facility] ENT at 9:18 and referral placed Discontinue IV Plan for discharge today once evaluated by ENT Continue plan of care per Dr. Denman George   LOS: 0 days    Dorothyann Gibbs 04/16/2017, 11:50 AM

## 2017-04-16 NOTE — Progress Notes (Signed)
Patient reports during assessment that R ear deafness is resolving as she is laying on her left side. She states it is still slightly echoing though but better than it was previous shift.

## 2017-04-16 NOTE — Progress Notes (Signed)
Patient now states her ear has not gotten any better and still is experiencing deafness.

## 2017-04-16 NOTE — Progress Notes (Signed)
Discussed hearing loss and finding of blood in ear canal on otoscopic eval with Dr Redmond Baseman from ENT. Dr Redmond Baseman feels that an outpatient evaluation next week is most appropriate. His office will contact Ms Spoto with the appointment time.   Thereasa Solo, MD

## 2017-04-20 ENCOUNTER — Telehealth: Payer: Self-pay | Admitting: *Deleted

## 2017-04-20 NOTE — Telephone Encounter (Signed)
Return the patient's call regarding her path report. Advised the patient that sometimes it can take a full week for the results to come back. We will call her with the results

## 2017-04-22 ENCOUNTER — Telehealth: Payer: Self-pay

## 2017-04-22 NOTE — Telephone Encounter (Addendum)
Received VM from pt regarding she is having pain on left side where her left ovary would be x 3 days.  Returned pt's call, let her know per [name] NP, pain is likely due to healing process and can take tylenol or ibuprofen for pain.  Pt reports painful on that side especially to walk, climb stairs,  she is taking ibuprofen and has prescription for Trazodone but not helping either.  Encouraged trying heating pad per [name] NP and pt says she had thought about using one but has not, but will now. Pt denies burning with urination or a fever.  Told pt to report worsening of pain and burning with urination, or fever, especially before noon tomorrow, as weekend is coming up. Pt voiced understanding. I let her know the path report is not back yet.  Pt verbalized she was not sleeping well due to waiting for those results and she would like [place] to call her as soon as report back.  I called the path dept also at this time and let the pt know that the path dept indicated it would be back tomorrow and I let [name] NP know as well and told pt we would call her with results.  Pt very appreciative, no other needs per pt at this time.

## 2017-04-23 ENCOUNTER — Other Ambulatory Visit: Payer: Self-pay | Admitting: Gynecologic Oncology

## 2017-04-23 DIAGNOSIS — G8918 Other acute postprocedural pain: Secondary | ICD-10-CM

## 2017-04-23 MED ORDER — OXYCODONE HCL 5 MG PO TABS: 5.0000 mg | ORAL_TABLET | ORAL | 0 refills | Status: DC | PRN

## 2017-04-26 NOTE — Anesthesia Postprocedure Evaluation (Signed)
Anesthesia Post Note  Patient: Katie Palmer  Procedure(s) Performed: XI ROBOTIC ASSISTED TOTAL LAPROSCOPIC  HYSTERECTOMY WITH LEFT SALPINGO OOPHORECTOMY (Bilateral ) SENTINEL LYMPH NODE BIOPSY, PERIAORTIC LYMPH NODES, AND VAGINAL BIOPSY (N/A )     Patient location during evaluation: PACU Anesthesia Type: General Level of consciousness: awake and alert Pain management: pain level controlled Vital Signs Assessment: post-procedure vital signs reviewed and stable Respiratory status: spontaneous breathing, nonlabored ventilation, respiratory function stable and patient connected to nasal cannula oxygen Cardiovascular status: blood pressure returned to baseline and stable Postop Assessment: no apparent nausea or vomiting Anesthetic complications: no    Last Vitals:  Vitals:   04/16/17 1112 04/16/17 1402  BP: 112/60 117/69  Pulse: 66 63  Resp:  18  Temp:  37.1 C  SpO2:  96%    Last Pain:  Vitals:   04/16/17 1402  TempSrc: Oral  PainSc:    Pain Goal: Patients Stated Pain Goal: 4 (04/16/17 0505)               Lyndle Herrlich EDWARD

## 2017-04-27 ENCOUNTER — Telehealth: Payer: Self-pay | Admitting: Gynecologic Oncology

## 2017-04-27 ENCOUNTER — Telehealth: Payer: Self-pay | Admitting: *Deleted

## 2017-04-27 ENCOUNTER — Other Ambulatory Visit: Payer: Self-pay | Admitting: Gynecologic Oncology

## 2017-04-27 DIAGNOSIS — C541 Malignant neoplasm of endometrium: Secondary | ICD-10-CM

## 2017-04-27 DIAGNOSIS — M79605 Pain in left leg: Secondary | ICD-10-CM

## 2017-04-27 NOTE — Telephone Encounter (Signed)
Scheduled doppler of the left leg for the patient. Appt scheduled for tomorrow morning at [facility]. Patient called and notified for appt

## 2017-04-27 NOTE — Telephone Encounter (Signed)
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 Recommend chemotherapy with carb/tax x 6 cycles and vaginal brachy.  Will set up consults with med onc and rad onc.  All questions answered.  (patient seeing ENT for bloody discharge from ears. Has groin pain that sounds genitofemoral. Rule out DVT with dopplers scheduled for tomorrow).   Thereasa Solo, MD

## 2017-04-27 NOTE — Progress Notes (Signed)
Patient called with complaints of bleeding from her right ear and left leg pain radiating to her calf post-operatively.  She states her hearing in her right ear came back on Thursday night of last week so she cancelled her appt with Dr. Redmond Baseman for Friday.  She states over the weekend both ears began bleeding.  Since then, the left has stopped but the right continues to bleed.  She also reports left leg pain that radiates to her calf.  She states the pain medication offered no relief of her leg pain symptoms.  Denies edema in either leg.  Advised to reach back out to Dr. Redmond Baseman office to get an appointment for evaluation of her ears since blood was noted behind both tympanic membranes on POD1 but more on the left.  Advised patient that we would order a doppler of the left leg to rule out DVT.  Patient also advised she would be contacted with Dr. Serita Grit recommendations on her final path as well.

## 2017-04-28 ENCOUNTER — Ambulatory Visit (HOSPITAL_BASED_OUTPATIENT_CLINIC_OR_DEPARTMENT_OTHER)
Admission: RE | Admit: 2017-04-28 | Discharge: 2017-04-28 | Disposition: A | Discharge status: Home / Self Care | Payer: Medicare Other | Source: Ambulatory Visit | Attending: Gynecologic Oncology | Admitting: Gynecologic Oncology

## 2017-04-28 ENCOUNTER — Encounter: Payer: Self-pay | Admitting: Radiation Oncology

## 2017-04-28 ENCOUNTER — Telehealth: Payer: Self-pay | Admitting: *Deleted

## 2017-04-28 ENCOUNTER — Telehealth: Payer: Self-pay | Admitting: Gynecologic Oncology

## 2017-04-28 DIAGNOSIS — M79605 Pain in left leg: Secondary | ICD-10-CM | POA: Insufficient documentation

## 2017-04-28 DIAGNOSIS — M792 Neuralgia and neuritis, unspecified: Secondary | ICD-10-CM

## 2017-04-28 MED ORDER — GABAPENTIN 300 MG PO CAPS: 300.0000 mg | ORAL_CAPSULE | Freq: Three times a day (TID) | ORAL | 0 refills | Status: DC

## 2017-04-28 NOTE — Addendum Note (Signed)
Addended by: [name] D on: 04/28/2017 11:31 AM   Modules accepted: Orders

## 2017-04-28 NOTE — Telephone Encounter (Signed)
I will coordinate with Sharyn Lull

## 2017-04-28 NOTE — Progress Notes (Signed)
*  Preliminary Results* Left lower extremity venous duplex completed. Left lower extremity is negative for deep vein thrombosis. There is no evidence of left Baker's cyst.  04/28/2017 10:00 AM  Maudry Mayhew, BS, RVT, RDCS, RDMS

## 2017-04-28 NOTE — Telephone Encounter (Signed)
Called and gave the patient the date/time of the appts for Dr. Alvy Bimler and Dr. Sondra Come

## 2017-04-28 NOTE — Telephone Encounter (Signed)
Spoke with patient about recent doppler. She continues to report significant pain in the left leg with no relief after taking oxycodone.  Discussed with her Dr. Serita Grit recommendations to begin neurontin.  She states she already takes gabapentin.  Advised we would need to titrate the dose up to see if her symptoms improve.  She states her pain is worse at night time and she is unable to rest.  She would like to start taking 300 mg BID with 600 mg at bedtime.  Fall precautions discussed.  She is to call with an update on her symptoms.  New script will be sent to her pharmacy.  Advised to call for any needs.

## 2017-04-29 ENCOUNTER — Encounter: Payer: Self-pay | Admitting: Gynecologic Oncology

## 2017-04-29 ENCOUNTER — Other Ambulatory Visit: Payer: Self-pay | Admitting: Gynecologic Oncology

## 2017-04-29 ENCOUNTER — Encounter (HOSPITAL_COMMUNITY): Payer: Self-pay

## 2017-04-29 ENCOUNTER — Ambulatory Visit (HOSPITAL_COMMUNITY)
Admission: RE | Admit: 2017-04-29 | Discharge: 2017-04-29 | Disposition: A | Discharge status: Home / Self Care | Payer: Medicare Other | Source: Ambulatory Visit | Attending: Gynecologic Oncology | Admitting: Gynecologic Oncology

## 2017-04-29 ENCOUNTER — Inpatient Hospital Stay (HOSPITAL_COMMUNITY)
Admission: AD | Admit: 2017-04-29 | Discharge: 2017-05-02 | DRG: 948 | Disposition: A | Discharge status: Home / Self Care | Payer: Medicare Other | Source: Ambulatory Visit | Attending: Gynecologic Oncology | Admitting: Gynecologic Oncology

## 2017-04-29 DIAGNOSIS — Z806 Family history of leukemia: Secondary | ICD-10-CM

## 2017-04-29 DIAGNOSIS — R102 Pelvic and perineal pain: Secondary | ICD-10-CM

## 2017-04-29 DIAGNOSIS — G43009 Migraine without aura, not intractable, without status migrainosus: Secondary | ICD-10-CM

## 2017-04-29 DIAGNOSIS — Z9079 Acquired absence of other genital organ(s): Secondary | ICD-10-CM

## 2017-04-29 DIAGNOSIS — F329 Major depressive disorder, single episode, unspecified: Secondary | ICD-10-CM | POA: Diagnosis present

## 2017-04-29 DIAGNOSIS — E785 Hyperlipidemia, unspecified: Secondary | ICD-10-CM

## 2017-04-29 DIAGNOSIS — C541 Malignant neoplasm of endometrium: Secondary | ICD-10-CM

## 2017-04-29 DIAGNOSIS — M79605 Pain in left leg: Secondary | ICD-10-CM

## 2017-04-29 DIAGNOSIS — M797 Fibromyalgia: Secondary | ICD-10-CM | POA: Diagnosis present

## 2017-04-29 DIAGNOSIS — G8918 Other acute postprocedural pain: Secondary | ICD-10-CM | POA: Diagnosis present

## 2017-04-29 DIAGNOSIS — Z90721 Acquired absence of ovaries, unilateral: Secondary | ICD-10-CM

## 2017-04-29 DIAGNOSIS — F5101 Primary insomnia: Secondary | ICD-10-CM

## 2017-04-29 DIAGNOSIS — I959 Hypotension, unspecified: Secondary | ICD-10-CM | POA: Diagnosis not present

## 2017-04-29 DIAGNOSIS — Z8249 Family history of ischemic heart disease and other diseases of the circulatory system: Secondary | ICD-10-CM

## 2017-04-29 DIAGNOSIS — R188 Other ascites: Principal | ICD-10-CM | POA: Diagnosis present

## 2017-04-29 DIAGNOSIS — Z9641 Presence of insulin pump (external) (internal): Secondary | ICD-10-CM | POA: Diagnosis present

## 2017-04-29 DIAGNOSIS — Z79899 Other long term (current) drug therapy: Secondary | ICD-10-CM

## 2017-04-29 DIAGNOSIS — E109 Type 1 diabetes mellitus without complications: Secondary | ICD-10-CM | POA: Diagnosis present

## 2017-04-29 DIAGNOSIS — I10 Essential (primary) hypertension: Secondary | ICD-10-CM | POA: Diagnosis present

## 2017-04-29 DIAGNOSIS — Z888 Allergy status to other drugs, medicaments and biological substances status: Secondary | ICD-10-CM

## 2017-04-29 DIAGNOSIS — M792 Neuralgia and neuritis, unspecified: Secondary | ICD-10-CM

## 2017-04-29 DIAGNOSIS — Z90710 Acquired absence of both cervix and uterus: Secondary | ICD-10-CM

## 2017-04-29 LAB — COMPREHENSIVE METABOLIC PANEL
ALBUMIN: 3.3 g/dL — AB (ref 3.5–5.0)
ALT: 89 U/L — ABNORMAL HIGH (ref 14–54)
ANION GAP: 9 (ref 5–15)
AST: 65 U/L — ABNORMAL HIGH (ref 15–41)
Alkaline Phosphatase: 95 U/L (ref 38–126)
BILIRUBIN TOTAL: 0.9 mg/dL (ref 0.3–1.2)
BUN: 15 mg/dL (ref 6–20)
CHLORIDE: 100 mmol/L — AB (ref 101–111)
CO2: 28 mmol/L (ref 22–32)
Calcium: 9.2 mg/dL (ref 8.9–10.3)
Creatinine, Ser: 0.86 mg/dL (ref 0.44–1.00)
GFR calc Af Amer: >60 mL/min (ref >60)
GFR calc non Af Amer: >60 mL/min (ref >60)
GLUCOSE: 351 mg/dL — AB (ref 65–99)
POTASSIUM: 4.1 mmol/L (ref 3.5–5.1)
Sodium: 137 mmol/L (ref 135–145)
TOTAL PROTEIN: 6.8 g/dL (ref 6.5–8.1)

## 2017-04-29 LAB — PROTIME-INR
INR: 0.94
Prothrombin Time: 12.5 seconds (ref 11.4–15.2)

## 2017-04-29 LAB — CBC
HEMATOCRIT: 41.4 % (ref 36.0–46.0)
HEMOGLOBIN: 13.5 g/dL (ref 12.0–15.0)
MCH: 28.3 pg (ref 26.0–34.0)
MCHC: 32.6 g/dL (ref 30.0–36.0)
MCV: 86.8 fL (ref 78.0–100.0)
Platelets: 248 10*3/uL (ref 150–400)
RBC: 4.77 MIL/uL (ref 3.87–5.11)
RDW: 14.2 % (ref 11.5–15.5)
WBC: 8.3 10*3/uL (ref 4.0–10.5)

## 2017-04-29 LAB — GLUCOSE, CAPILLARY: Glucose-Capillary: 251 mg/dL — ABNORMAL HIGH (ref 65–99)

## 2017-04-29 MED ORDER — INSULIN PUMP
Freq: Three times a day (TID) | SUBCUTANEOUS | Status: DC
  Administered 2017-04-29 – 2017-04-30 (×4): via SUBCUTANEOUS
  Administered 2017-04-30: 9.2 via SUBCUTANEOUS
  Administered 2017-05-01: 0.27 via SUBCUTANEOUS
  Administered 2017-05-01: 03:00:00 via SUBCUTANEOUS
  Administered 2017-05-02: 2.29 via SUBCUTANEOUS
  Filled 2017-04-29: qty 1

## 2017-04-29 MED ORDER — ONDANSETRON HCL 4 MG/2ML IJ SOLN: 4.0000 mg | Freq: Four times a day (QID) | INTRAMUSCULAR | Status: DC | PRN

## 2017-04-29 MED ORDER — SODIUM CHLORIDE 0.9 % IV SOLN
INTRAVENOUS | Status: DC
  Administered 2017-04-29: 18:00:00 via INTRAVENOUS

## 2017-04-29 MED ORDER — NALOXONE HCL 0.4 MG/ML IJ SOLN: 0.4000 mg | INTRAMUSCULAR | Status: DC | PRN

## 2017-04-29 MED ORDER — DIPHENHYDRAMINE HCL 50 MG/ML IJ SOLN: 12.5000 mg | Freq: Four times a day (QID) | INTRAMUSCULAR | Status: DC | PRN

## 2017-04-29 MED ORDER — ONDANSETRON HCL 4 MG PO TABS: 4.0000 mg | ORAL_TABLET | Freq: Four times a day (QID) | ORAL | Status: DC | PRN

## 2017-04-29 MED ORDER — IOPAMIDOL (ISOVUE-300) INJECTION 61%
INTRAVENOUS | Status: AC
  Filled 2017-04-29: qty 100

## 2017-04-29 MED ORDER — HYDROMORPHONE 1 MG/ML IV SOLN
INTRAVENOUS | Status: DC
  Administered 2017-04-29: 18:00:00 via INTRAVENOUS
  Administered 2017-04-30: 2 mg via INTRAVENOUS
  Administered 2017-04-30: 1.8 mg via INTRAVENOUS
  Administered 2017-05-01: 2.8 mg via INTRAVENOUS
  Filled 2017-04-29: qty 25

## 2017-04-29 MED ORDER — EZETIMIBE 10 MG PO TABS
10.0000 mg | ORAL_TABLET | Freq: Every day | ORAL | Status: DC
  Administered 2017-04-29 – 2017-05-01 (×3): 10 mg via ORAL
  Filled 2017-04-29 (×3): qty 1

## 2017-04-29 MED ORDER — TRAZODONE HCL 100 MG PO TABS
100.0000 mg | ORAL_TABLET | Freq: Every day | ORAL | Status: DC
  Administered 2017-04-29 – 2017-05-01 (×3): 100 mg via ORAL
  Filled 2017-04-29 (×3): qty 1

## 2017-04-29 MED ORDER — DULOXETINE HCL 30 MG PO CPEP
90.0000 mg | ORAL_CAPSULE | Freq: Every evening | ORAL | Status: DC
  Administered 2017-04-29 – 2017-05-01 (×3): 90 mg via ORAL
  Filled 2017-04-29 (×3): qty 3

## 2017-04-29 MED ORDER — GABAPENTIN 300 MG PO CAPS
300.0000 mg | ORAL_CAPSULE | Freq: Two times a day (BID) | ORAL | Status: DC
  Administered 2017-04-30 – 2017-05-01 (×3): 300 mg via ORAL
  Filled 2017-04-29 (×3): qty 1

## 2017-04-29 MED ORDER — IOPAMIDOL (ISOVUE-300) INJECTION 61%
INTRAVENOUS | Status: AC
  Filled 2017-04-29: qty 30

## 2017-04-29 MED ORDER — HYDROCHLOROTHIAZIDE 12.5 MG PO CAPS
12.5000 mg | ORAL_CAPSULE | Freq: Every day | ORAL | Status: DC
  Administered 2017-04-30 – 2017-05-02 (×2): 12.5 mg via ORAL
  Filled 2017-04-29 (×2): qty 1

## 2017-04-29 MED ORDER — LOSARTAN POTASSIUM 50 MG PO TABS
50.0000 mg | ORAL_TABLET | Freq: Every day | ORAL | Status: DC
  Administered 2017-04-30 – 2017-05-02 (×2): 50 mg via ORAL
  Filled 2017-04-29 (×2): qty 1

## 2017-04-29 MED ORDER — SENNA 8.6 MG PO TABS
1.0000 | ORAL_TABLET | Freq: Every day | ORAL | Status: DC
  Administered 2017-04-29 – 2017-05-01 (×3): 8.6 mg via ORAL
  Filled 2017-04-29 (×3): qty 1

## 2017-04-29 MED ORDER — SODIUM CHLORIDE 0.9% FLUSH: 9.0000 mL | INTRAVENOUS | Status: DC | PRN

## 2017-04-29 MED ORDER — SUMATRIPTAN SUCCINATE 50 MG PO TABS: 100.0000 mg | ORAL_TABLET | ORAL | Status: DC | PRN

## 2017-04-29 MED ORDER — ALUM & MAG HYDROXIDE-SIMETH 200-200-20 MG/5ML PO SUSP: 30.0000 mL | ORAL | Status: DC | PRN

## 2017-04-29 MED ORDER — IOPAMIDOL (ISOVUE-300) INJECTION 61%
100.0000 mL | Freq: Once | INTRAVENOUS | Status: AC | PRN
  Administered 2017-04-29: 100 mL via INTRAVENOUS

## 2017-04-29 MED ORDER — SUMATRIPTAN SUCCINATE 50 MG PO TABS
100.0000 mg | ORAL_TABLET | ORAL | Status: DC | PRN
  Filled 2017-04-29: qty 2

## 2017-04-29 MED ORDER — DIPHENHYDRAMINE HCL 12.5 MG/5ML PO ELIX
12.5000 mg | ORAL_SOLUTION | Freq: Four times a day (QID) | ORAL | Status: DC | PRN
  Administered 2017-04-30 – 2017-05-01 (×2): 12.5 mg via ORAL
  Filled 2017-04-29 (×2): qty 5

## 2017-04-29 MED ORDER — IOPAMIDOL (ISOVUE-300) INJECTION 61%
30.0000 mL | Freq: Once | INTRAVENOUS | Status: DC | PRN
Start: 2017-04-29 — End: 2017-04-30

## 2017-04-29 MED ORDER — BISACODYL 10 MG RE SUPP: 10.0000 mg | Freq: Every day | RECTAL | Status: DC | PRN

## 2017-04-29 MED ORDER — GABAPENTIN 300 MG PO CAPS
600.0000 mg | ORAL_CAPSULE | Freq: Every day | ORAL | Status: DC
  Administered 2017-04-29 – 2017-05-01 (×3): 600 mg via ORAL
  Filled 2017-04-29 (×3): qty 2

## 2017-04-29 MED ORDER — LOSARTAN POTASSIUM-HCTZ 50-12.5 MG PO TABS: 1.0000 | ORAL_TABLET | Freq: Every day | ORAL | Status: DC

## 2017-04-29 MED ORDER — SIMETHICONE 80 MG PO CHEW: 80.0000 mg | CHEWABLE_TABLET | Freq: Four times a day (QID) | ORAL | Status: DC | PRN

## 2017-04-29 NOTE — Progress Notes (Signed)
Patient called this am stating "my leg is killing me."  She has been unable to sleep during the pain and is having difficulty ambulating due to the pain.  The pain begins in her pelvis then radiates down her left leg to the ankle.  She took 600 mg of gabapentin last pm then threw up so it did not offer any relief.  Situation discussed with Dr. Denman George who recommends a stat CT scan to rule out pelvic fluid collection pushing on the obturator nerve that may need IR drainage if present.  Patient stating she should be fine to drive herself to the hospital.  Advised she would be contacted with information pertaining to the CT scan.  No other other from Dr. Denman George.

## 2017-04-29 NOTE — H&P (Signed)
H&P Gynecologic Oncology  Katie Palmer 67 y.o. female  HPI: Katie Palmer is a 67 year old female initially seen in consultation at the request of Dr. Philis Pique for a grade 3 endometrial cancer confirmed on endometrial biopsy from 03/17/17.  She presented with post-menopausal bleeding since December 2018.  She had a pap smear which resulted AGUS with an endometrial biopsy performed after that time.  CT AP performed pre-operatively resulting:Diffuse endometrial thickening consistent with endometrial carcinoma, with probable focal area of deep myometrial invasion in the anterior fundus.  No evidence of extra uterine tumor extension, lymphadenopathy, or distant metastatic disease.  Two tiny bilateral lower lobe pulmonary nodules, largest measuring 5 mm. These are indeterminate, but likely benign. Recommend follow-up with chest CT without contrast in 3-6 months.  On 04/15/2017, she underwent a robotic-assisted laparoscopic total hysterectomy with left salpingoophorectomy, SLN biopsy, para-aortic lymphadenectomy (bilateral), vaginal biopsy with Dr. Everitt Amber.  Her post-operative course was uneventful and she was discharged home on POD 1. Final path resulted:  1. Vagina, biopsy - ATYPICAL CELLS, SUSPICIOUS BUT NOT DIAGNOSTIC OF MALIGNANCY. SEE NOTE 2. Lymph node, sentinel, biopsy, right presacral #1 - LYMPH NODE, NEGATIVE FOR MALIGNANCY (0/1) - IMMUNOSTAIN FOR PANKERATIN SHOWS FOCAL NONSPECIFIC LABELING 3. Lymph node, sentinel, biopsy, right presacral #2 - LYMPH NODE, NEGATIVE FOR MALIGNANCY (0/1) - IMMUNOSTAIN FOR PANKERATIN IS NEGATIVE 4. Lymph node, sentinel, biopsy, left external iliac - LYMPH NODE, NEGATIVE FOR MALIGNANCY (0/1) - IMMUNOSTAIN FOR PANKERATIN IS NEGATIVE 5. Lymph node, biopsy, right periaortic - TWO LYMPH NODES, NEGATIVE FOR MALIGNANCY (0/2) 6. Lymph node, biopsy, left periaortic - SEVEN LYMPH NODES, NEGATIVE FOR MALIGNANCY (0/7) 7. 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 SEE NOTE - TUMOR INVADES A LITTLE OVER HALF OF THE MYOMETRIUM (INVASION DEPTH OF 0.7 CM WHERE MYOMETRIUM IS 1.3 CM THICK) - SURGICAL MARGINS ARE NEGATIVE FOR MALIGNANCY - NEGATIVE FOR LYMPHOVASCULAR INVASION - NEGATIVE FOR CERVICAL INVOLVEMENT - LEFT OVARY WITH BENIGN SEROUS CYST AND ENDOSALPINGIOSIS - DEFINITIVE LEFT FALLOPIAN TUBE IS NOT IDENTIFIED - SEE ONCOLOGY TABLE     Interval History:  She is currently being admitted for severe left leg pain and pelvic fluid collections for IR evaluation for drainage.  She reports the development of left lower abdominal/pelvic discomfort starting on Monday, March 25.  She had taken ibuprofen with little relief and stated the pain was worse with ambulation and going up stairs.  Her discomfort continued throughout the week then the pain started to spread to her upper left leg then now is currently down to her ankle.  She was advised yesterday to increase her dose of gabapentin during the day and in the evening.  Doppler of the left leg on 04/28/17 was negative.  She called the office this am and stated the pain was still severe.  No relief from taking gabapentin or pain medication from surgery.  She states she was unable to sleep due to the pain and she is barely able to walk this am.  A CT scan of the abdomen/pelvis was obtained today to evaluate for pelvic fluid collection.  CT resulted:Multiloculated bilateral retroperitoneal fluid collections, consistent with postoperative lymphoceles. No evidence of hydronephrosis or other complication.      Review of Systems  Constitutional: Feels in distress due to severe left leg pain.  No fever, chills, early satiety.   Cardiovascular: No chest pain, shortness of breath, or edema.  Pulmonary: No cough or wheeze.  Gastrointestinal: Emesis last pm. No nausea or diarrhea. No bright red  blood per rectum or change in bowel movement.  Genitourinary: No frequency, urgency, or dysuria. Vaginal spotting.  Musculoskeletal: Severe left leg pain that begins in the left pelvis and radiates down the entire leg to the ankle Neurologic: Change in gait related to pain in the left leg.  Psychology: Upset about recent diagnosis, insomnia related to pain  Current Meds:  Current Facility-Administered Medications on File Prior to Encounter  Medication Dose Route Frequency Provider Last Rate Last Dose  . iopamidol (ISOVUE-300) 61 % injection 30 mL  30 mL Oral Once PRN Tristian Sickinger D, NP      . iopamidol (ISOVUE-300) 61 % injection           . iopamidol (ISOVUE-300) 61 % injection            Current Outpatient Medications on File Prior to Encounter  Medication Sig Dispense Refill  . ALPRAZolam (XANAX) 1 MG tablet TAKE 1/2 TO 1 TABLET BY MOUTH AT BEDTIME AS NEEDED FOR ANXIETY 30 tablet 5  . DULoxetine (CYMBALTA) 30 MG capsule Take 90 mg by mouth every evening.   2  . Ergocalciferol (VITAMIN D2 PO) Take 50,000 Units by mouth every 7 (seven) days. Ergocalciferol Vitalmin D2 50,000 u capsule  SUNDAYS    . ezetimibe (ZETIA) 10 MG tablet Take 10 mg by mouth daily.  12  . gabapentin (NEURONTIN) 300 MG capsule Take 1 capsule (300 mg total) by mouth 3 (three) times daily. Take 300 mg twice a day and 600 mg at bedtime 120 capsule 0  . GLUCAGON EMERGENCY 1 MG injection Reported on 06/03/2015  2  . losartan-hydrochlorothiazide (HYZAAR) 50-12.5 MG tablet TAKE 1 TABLET BY MOUTH EVERY DAY 90 tablet 0  . NOVOLOG 100 UNIT/ML injection UNITS BY PUMP DEPENDS ON SUGAR READINGS, SLIDING SCALE    . oxyCODONE (OXY IR/ROXICODONE) 5 MG immediate release tablet Take 1 tablet (5 mg total) by mouth every 4 (four) hours as needed for severe pain. 30 tablet 0  . SUMAtriptan (IMITREX) 100 MG tablet TAKE 1 TABLET BY MOUTH AS NEEDED FOR MIGRAINES. MAY REPEAT IN 2 HOURS IF NEEDED 30 tablet 5  . traMADol (ULTRAM) 50 MG tablet  Take 1 tablet (50 mg total) by mouth every 6 (six) hours as needed. 30 tablet 0  . traZODone (DESYREL) 100 MG tablet Take 1 tablet by mouth at bedtime.  1    Allergy:  Allergies  Allergen Reactions  . Armodafinil     Flu-like symptoms  . Invokana [Canagliflozin] Other (See Comments)    "shut down pancreas"   . Lasix [Furosemide] Diarrhea    Severe diarrhea    Social Hx:   Social History   Socioeconomic History  . Marital status: Divorced    Spouse name: Not on file  . Number of children: 1  . Years of education: Not on file  . Highest education level: Not on file  Occupational History  . 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 Financial resource strain: Not on file  . Food insecurity:    Worry: Not on file    Inability: Not on file  . Transportation needs:    Medical: Not on file    Non-medical: Not on file  Tobacco Use  . Smoking status: Never Smoker  . Smokeless tobacco: Never Used  Substance and Sexual Activity  . Alcohol use: No    Alcohol/week:  0.0 oz  . Drug use: No  . Sexual activity: Never    Birth control/protection: Post-menopausal  Lifestyle  . Physical activity:    Days per week: Not on file    Minutes per session: Not on file  . Stress: Not on file  Relationships  . Social connections:    Talks on phone: Not on file    Gets together: Not on file    Attends religious service: Not on file    Active member of club or organization: Not on file    Attends meetings of clubs or organizations: Not on file    Relationship status: Not on file  . Intimate partner violence:    Fear of current or ex partner: Not on file    Emotionally abused: Not on file    Physically abused: Not on file    Forced sexual activity: Not on file  Other Topics Concern  . Not on file  Social History Narrative   Divorced.  Son and grandson live with her.  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    Past Surgical Hx:  Past Surgical History:   Procedure Laterality Date  . CARDIOVASCULAR STRESS TEST  07/26/2015   normal myocardial perfusion study w/ no ischemia/  normal LV function and wall motion , ef 71%  . COLONOSCOPY WITH PROPOFOL  last one 10/ 2017  . HYSTEROSCOPY W/D&C N/A 12/25/2015   Procedure: DILATATION AND CURETTAGE /HYSTEROSCOPY;  Surgeon: Bobbye Charleston, MD;  Location: [facility];  Service: Gynecology;  Laterality: N/A;  . LAPAROSCOPIC LYSIS OF ADHESIONS N/A 12/25/2015   Procedure: LAPAROSCOPIC LYSIS OF ADHESIONS;  Surgeon: Bobbye Charleston, MD;  Location: [facility];  Service: Gynecology;  Laterality: N/A;  . LAPAROSCOPIC UNILATERAL SALPINGO OOPHERECTOMY Right 12/25/2015   Procedure: LAPAROSCOPIC UNILATERAL SALPINGO OOPHORECTOMY;  Surgeon: Bobbye Charleston, MD;  Location: [facility];  Service: Gynecology;  Laterality: Right;  . LAPAROSCOPY N/A 12/25/2015   Procedure: LAPAROSCOPY OPERATIVE;  Surgeon: Bobbye Charleston, MD;  Location: [facility];  Service: Gynecology;  Laterality: N/A;  . LYMPH NODE BIOPSY N/A 04/15/2017   Procedure: SENTINEL LYMPH NODE BIOPSY, PERIAORTIC LYMPH NODES, AND VAGINAL BIOPSY;  Surgeon: Everitt Amber, MD;  Location: [facility];  Service: Gynecology;  Laterality: N/A;  . ROBOTIC ASSISTED TOTAL HYSTERECTOMY WITH BILATERAL SALPINGO OOPHERECTOMY Bilateral 04/15/2017   Procedure: XI ROBOTIC ASSISTED TOTAL LAPROSCOPIC  HYSTERECTOMY WITH LEFT SALPINGO OOPHORECTOMY;  Surgeon: Everitt Amber, MD;  Location: [facility];  Service: Gynecology;  Laterality: Bilateral;  . TONSILLECTOMY  age 80  . TRANSTHORACIC ECHOCARDIOGRAM  07/26/2015   grade 1 diastolic dysfunction , ef 60-65%/  trivial TR    Past Medical Hx:  Past Medical History:  Diagnosis Date  . Depression   . Fibromyalgia   . Headache(784.0)   . History of colon polyps   . History of diabetes with ketoacidosis   . Hx of colonic polyps   . Hypertension   . Insomnia   . Insulin pump in place    . RBBB (right bundle branch block)   . RLQ abdominal pain   . Thickened endometrium   . Type 1 diabetes mellitus with long-term current use of insulin [facility]) endocrinologist-- dr Chalmers Cater   July 2015--  pt took Invokana and caused complete pancreas shut-down , pt went from pre-diabetic to adult type 1 dm now insulin dependent  . Vertigo     Family Hx:  Family History  Problem Relation  Age of Onset  . Alcohol abuse Father        Died age 96  . Heart disease Father   . Aneurysm Mother        Died age 21  . Leukemia Brother        Died age 32  . COPD Sister   . Rheum arthritis Sister   . Heart failure Sister   . Dementia Maternal Uncle   . Colon cancer Neg Hx     Physical Exam:  General: Well developed, well nourished female tearful and distressed due to left leg pain. Alert and oriented x 3.  Cardiovascular: Regular rate and rhythm. S1 and S2 normal.  Lungs: Clear to auscultation bilaterally. No wheezes/crackles/rhonchi noted.  Skin: No rashes or lesions present. Back: No CVA tenderness.  Abdomen: Abdomen soft, non-tender and obese. Active bowel sounds in all quadrants. No evidence of a fluid wave or abdominal masses. Lap sites to the abdomen without active drainage.  Extremities: No bilateral cyanosis, edema, or clubbing.   Assessment/Plan: CT was reviewed with the patient in the office along with the images.  Recommendation per Dr. Denman George for the patient to be admitted for pain control (PCA) and IR evaluation for pelvic fluid collection drainage.  Patient agreeable with this plan.  Direct admit initiated.  Dorothyann Gibbs, NP 04/29/2017, 4:06 PM

## 2017-04-29 NOTE — Progress Notes (Signed)
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 Vagina, biopsy - ATYPICAL CELLS, SUSPICIOUS BUT NOT DIAGNOSTIC OF MALIGNANCY. SEE NOTE 2. Lymph node, sentinel, biopsy, right presacral #1 - LYMPH NODE, NEGATIVE FOR MALIGNANCY (0/1) - IMMUNOSTAIN FOR PANKERATIN SHOWS FOCAL NONSPECIFIC LABELING 3. Lymph node, sentinel, biopsy, right presacral #2 - LYMPH NODE, NEGATIVE FOR MALIGNANCY (0/1) - IMMUNOSTAIN FOR PANKERATIN IS NEGATIVE 4. Lymph node, sentinel, biopsy, left external iliac - LYMPH NODE, NEGATIVE FOR MALIGNANCY (0/1) - IMMUNOSTAIN FOR PANKERATIN IS NEGATIVE 5. Lymph node, biopsy, right periaortic - TWO LYMPH NODES, NEGATIVE FOR MALIGNANCY (0/2) 6. Lymph node, biopsy, left periaortic - SEVEN LYMPH NODES, NEGATIVE FOR MALIGNANCY (0/7) 7. 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 SEE NOTE - TUMOR INVADES A LITTLE OVER HALF OF THE MYOMETRIUM (INVASION DEPTH OF 0.7 CM WHERE MYOMETRIUM IS 1.3 CM THICK) - SURGICAL MARGINS ARE NEGATIVE FOR MALIGNANCY - NEGATIVE FOR LYMPHOVASCULAR INVASION - NEGATIVE FOR CERVICAL INVOLVEMENT - LEFT OVARY WITH BENIGN SEROUS CYST AND ENDOSALPINGIOSIS - DEFINITIVE LEFT FALLOPIAN TUBE IS NOT IDENTIFIED  Past/Anticipated interventions by Gyn/Onc surgery, if any: 04/15/17 - Procedure: XI ROBOTIC ASSISTED TOTAL LAPROSCOPIC  HYSTERECTOMY WITH LEFT SALPINGO OOPHORECTOMY;  Surgeon: Everitt Amber, MD   Past/Anticipated interventions by medical oncology, if any: apt with Dr. Alvy Bimler 05/03/17  Weight changes, if any:   Bowel/Bladder complaints, if any: ,   Nausea/Vomiting, if any:   Pain issues, if any:    SAFETY ISSUES:  Prior radiation?   Pacemaker/ICD?   Possible current  pregnancy? no  Is the patient on methotrexate?   Current Complaints / other details:  Dr. Denman George is recommending chemotherapy with carb/tax x 6 cycles and vaginal brachy.

## 2017-04-29 NOTE — Progress Notes (Signed)
Patient came to the office after having CT scan to wait for results.  Patient tearful and in significant pain in the left leg.  She states it feels unbearable with nothing allevating her symptoms. She is able to walk but it is painful.  Discussed path results while waiting for scan.  Informed patient of scan results with Dr. Serita Grit recommendations for admission to the hospital for pain control and to have IR drain the fluid collections.  She is agreeable with this plan.  She wants to go home and get her things together.  She is alert, oriented, stable, ambulating without assist.  She is to call the office once she is ready to come back to the hospital.  Direct admit called in and patient to go to [facility].

## 2017-04-29 NOTE — Progress Notes (Signed)
Patient resting quietly in bed.  Alert, oriented x 3.  Stating she is starting to notice pain beginning in her right leg as well starting at the calf and moving up.  Advised patient of orders including PCA and IR evaluation.  All questions answered.  No concerns voiced at this time.  Patient will not be able to tolerate SCDs so TEDS ordered.  No lovenox due to upcoming IR procedure.

## 2017-04-30 ENCOUNTER — Other Ambulatory Visit: Payer: Self-pay

## 2017-04-30 ENCOUNTER — Encounter (HOSPITAL_COMMUNITY): Payer: Self-pay

## 2017-04-30 ENCOUNTER — Observation Stay (HOSPITAL_COMMUNITY): Payer: Medicare Other

## 2017-04-30 DIAGNOSIS — Z9079 Acquired absence of other genital organ(s): Secondary | ICD-10-CM | POA: Diagnosis not present

## 2017-04-30 DIAGNOSIS — Z806 Family history of leukemia: Secondary | ICD-10-CM | POA: Diagnosis not present

## 2017-04-30 DIAGNOSIS — M797 Fibromyalgia: Secondary | ICD-10-CM | POA: Diagnosis present

## 2017-04-30 DIAGNOSIS — F329 Major depressive disorder, single episode, unspecified: Secondary | ICD-10-CM | POA: Diagnosis present

## 2017-04-30 DIAGNOSIS — E109 Type 1 diabetes mellitus without complications: Secondary | ICD-10-CM | POA: Diagnosis present

## 2017-04-30 DIAGNOSIS — Z90710 Acquired absence of both cervix and uterus: Secondary | ICD-10-CM | POA: Diagnosis not present

## 2017-04-30 DIAGNOSIS — Z79899 Other long term (current) drug therapy: Secondary | ICD-10-CM | POA: Diagnosis not present

## 2017-04-30 DIAGNOSIS — I10 Essential (primary) hypertension: Secondary | ICD-10-CM | POA: Diagnosis present

## 2017-04-30 DIAGNOSIS — Z8249 Family history of ischemic heart disease and other diseases of the circulatory system: Secondary | ICD-10-CM | POA: Diagnosis not present

## 2017-04-30 DIAGNOSIS — Z9641 Presence of insulin pump (external) (internal): Secondary | ICD-10-CM | POA: Diagnosis present

## 2017-04-30 DIAGNOSIS — I959 Hypotension, unspecified: Secondary | ICD-10-CM | POA: Diagnosis not present

## 2017-04-30 DIAGNOSIS — M79605 Pain in left leg: Secondary | ICD-10-CM

## 2017-04-30 DIAGNOSIS — R188 Other ascites: Secondary | ICD-10-CM | POA: Diagnosis present

## 2017-04-30 DIAGNOSIS — Z888 Allergy status to other drugs, medicaments and biological substances status: Secondary | ICD-10-CM | POA: Diagnosis not present

## 2017-04-30 DIAGNOSIS — G8918 Other acute postprocedural pain: Secondary | ICD-10-CM | POA: Diagnosis present

## 2017-04-30 DIAGNOSIS — Z90721 Acquired absence of ovaries, unilateral: Secondary | ICD-10-CM | POA: Diagnosis not present

## 2017-04-30 DIAGNOSIS — C541 Malignant neoplasm of endometrium: Secondary | ICD-10-CM | POA: Diagnosis present

## 2017-04-30 LAB — GLUCOSE, CAPILLARY
GLUCOSE-CAPILLARY: 177 mg/dL — AB (ref 65–99)
GLUCOSE-CAPILLARY: 139 mg/dL — AB (ref 65–99)
Glucose-Capillary: 237 mg/dL — ABNORMAL HIGH (ref 65–99)
Glucose-Capillary: 117 mg/dL — ABNORMAL HIGH (ref 65–99)

## 2017-04-30 LAB — CREATININE, FLUID (PLEURAL, PERITONEAL, JP DRAINAGE): Creat, Fluid: 0.8 mg/dL

## 2017-04-30 MED ORDER — FENTANYL CITRATE (PF) 100 MCG/2ML IJ SOLN
INTRAMUSCULAR | Status: AC
  Filled 2017-04-30: qty 4

## 2017-04-30 MED ORDER — MIDAZOLAM HCL 2 MG/2ML IJ SOLN
INTRAMUSCULAR | Status: AC
  Filled 2017-04-30: qty 4

## 2017-04-30 MED ORDER — FENTANYL CITRATE (PF) 100 MCG/2ML IJ SOLN
INTRAMUSCULAR | Status: AC | PRN
  Administered 2017-04-30 (×2): 50 ug via INTRAVENOUS

## 2017-04-30 MED ORDER — SODIUM CHLORIDE 0.9% FLUSH
5.0000 mL | Freq: Three times a day (TID) | INTRAVENOUS | Status: DC
  Administered 2017-04-30 – 2017-05-01 (×4): 5 mL

## 2017-04-30 MED ORDER — LIDOCAINE HCL 1 % IJ SOLN
INTRAMUSCULAR | Status: AC | PRN
  Administered 2017-04-30: 10 mL via INTRADERMAL

## 2017-04-30 MED ORDER — MIDAZOLAM HCL 2 MG/2ML IJ SOLN
INTRAMUSCULAR | Status: AC | PRN
  Administered 2017-04-30 (×2): 1 mg via INTRAVENOUS

## 2017-04-30 NOTE — Progress Notes (Signed)
  GYN ONC  Subjective: Patient reports slight improvement in her left leg discomfort but still did not get much rest last pm.  The right leg discomfort starting in the lower leg last pm has resolved.  Pain rated as a 6 in the left leg when ambulating that is mostly in the left groin.  Able to bear weight and ambulate without weakness in that leg.  Tolerating diet.  Blood sugars have been elevated.  She states she does not know why.  She had a salad for dinner and has been managing her pump.  Denies chest pain, dyspnea.  Bowels and bladder functioning without difficulty.     Objective: Vital signs in last 24 hours: Temp:  [97.9 F (36.6 C)-98.1 F (36.7 C)] 98.1 F (36.7 C) (04/05 0544) Pulse Rate:  [75-89] 75 (04/05 0544) Resp:  [12-19] 14 (04/05 0801) BP: (97-127)/(61-79) 115/71 (04/05 0544) SpO2:  [93 %-99 %] 94 % (04/05 0801) Weight:  [232 lb 9.4 oz (105.5 kg)] 232 lb 9.4 oz (105.5 kg) (04/05 0544) Last BM Date: 04/29/17  Intake/Output from previous day: No intake/output data recorded.  Physical Examination: General: alert, cooperative and no distress Resp: clear to auscultation bilaterally Cardio: regular rate and rhythm, S1, S2 normal, no murmur, click, rub or gallop GI: soft, non-tender; bowel sounds normal; no masses,  no organomegaly and incision: lap sites to the abdomen with dermabond healing with no active drainage noted Extremities: no bilateral edema noted, tenderness in the left leg with light palpation, able to move leg without difficulty  Labs: WBC/Hgb/Hct/Plts:  8.3/13.5/41.4/248 (04/04 1654) BUN/Cr/glu/ALT/AST/amyl/lip:  15/0.86/--/89/65/--/-- (04/04 1654)  Assessment: 67 y.o. s/p : stable Pain:  Pain is well-controlled on PCA.  Heme: Hgb 13.5 and Hct 41.4 yesterday evening.  Stable.  CV: BP and HR stable.  Continue to monitor with routine vital signs.  GI:  Tolerating po: Yes.  Antiemetics ordered PRN.  GU: Voiding without difficulty.    FEN: Stable on  Cmet from yesterday evening.  Endo: Diabetes mellitus Type I, under fair control..  CBG:  CBG (last 3)  Recent Labs    04/29/17 2152  GLUCAP 251*   Patient using insulin pump appropriately.  Prophylaxis: No lovenox due to upcoming IR procedure.  SCDs ordered but patient unable to tolerate due to discomfort in her legs.  TED hose ordered last pm but not on patient at this time.  Plan: Awaiting IR drainage of pelvic fluid collections Diet to carb mod Plan to transition patient from PCA to oral pain medications Continue plan of care per Dr. Denman George   LOS: 0 days    Lenna Sciara D Hartman Minahan 04/30/2017, 9:08 AM

## 2017-04-30 NOTE — Procedures (Signed)
Abdominal abscess drain times two 10 Fr Cloudy yellow fluid EBL 0 Comp 0

## 2017-04-30 NOTE — Care Management Obs Status (Signed)
[facility] NOTIFICATION   Patient Details  Name: Katie Palmer MRN: [id number] Date of Birth: 1950/03/12   Medicare Observation Status Notification Given:  Yes    Lynnell Catalan, RN 04/30/2017, 1:11 PM

## 2017-04-30 NOTE — Consult Note (Signed)
Chief Complaint: Patient was seen in consultation today for  CT guided drainage of retroperitoneal fluid collections  Referring Physician(s): Cross,[name] D/Rossi,E  Supervising Physician: Marybelle Killings  Patient Status: [facility] - In-pt  History of Present Illness: Katie Palmer is a 68 y.o. 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  He now presents with week long history of pelvic pressure as well as progressive left greater than right lower extremity pain.  Subsequent imaging has revealed multiloculated bilateral retroperitoneal fluid collections. Request now received from GYN oncology for image guided drainage of the above-noted collections.  Past Medical History:  Diagnosis Date  . Depression   . Fibromyalgia   . Headache(784.0)   . History of colon polyps   . History of diabetes with ketoacidosis   . Hx of colonic polyps   . Hypertension   . Insomnia   . Insulin pump in place   . RBBB (right bundle branch block)   . RLQ abdominal pain   . Thickened endometrium   . Type 1 diabetes mellitus with long-term current use of insulin [place]) endocrinologist-- dr Chalmers Cater   July 2015--  pt took Invokana and caused complete pancreas shut-down , pt went from pre-diabetic to adult type 1 dm now insulin dependent  . Vertigo     Past Surgical History:  Procedure Laterality Date  . CARDIOVASCULAR STRESS TEST  07/26/2015   normal myocardial perfusion study w/ no ischemia/  normal LV function and wall motion , ef 71%  . COLONOSCOPY WITH PROPOFOL  last one 10/ 2017  . HYSTEROSCOPY W/D&C N/A 12/25/2015   Procedure: DILATATION AND CURETTAGE /HYSTEROSCOPY;  Surgeon: Bobbye Charleston, MD;  Location: [facility];  Service: Gynecology;  Laterality: N/A;  . LAPAROSCOPIC LYSIS OF ADHESIONS N/A  12/25/2015   Procedure: LAPAROSCOPIC LYSIS OF ADHESIONS;  Surgeon: Bobbye Charleston, MD;  Location: [place];  Service: Gynecology;  Laterality: N/A;  . LAPAROSCOPIC UNILATERAL SALPINGO OOPHERECTOMY Right 12/25/2015   Procedure: LAPAROSCOPIC UNILATERAL SALPINGO OOPHORECTOMY;  Surgeon: Bobbye Charleston, MD;  Location: [place];  Service: Gynecology;  Laterality: Right;  . LAPAROSCOPY N/A 12/25/2015   Procedure: LAPAROSCOPY OPERATIVE;  Surgeon: Bobbye Charleston, MD;  Location: [facility];  Service: Gynecology;  Laterality: N/A;  . LYMPH NODE BIOPSY N/A 04/15/2017   Procedure: SENTINEL LYMPH NODE BIOPSY, PERIAORTIC LYMPH NODES, AND VAGINAL BIOPSY;  Surgeon: Everitt Amber, MD;  Location: [facility];  Service: Gynecology;  Laterality: N/A;  . ROBOTIC ASSISTED TOTAL HYSTERECTOMY WITH BILATERAL SALPINGO OOPHERECTOMY Bilateral 04/15/2017   Procedure: XI ROBOTIC ASSISTED TOTAL LAPROSCOPIC  HYSTERECTOMY WITH LEFT SALPINGO OOPHORECTOMY;  Surgeon: Everitt Amber, MD;  Location: [facility];  Service: Gynecology;  Laterality: Bilateral;  . TONSILLECTOMY  age 58  . TRANSTHORACIC ECHOCARDIOGRAM  07/26/2015   grade 1 diastolic dysfunction , ef 60-65%/  trivial TR    Allergies: Armodafinil; Invokana [canagliflozin]; and Lasix [furosemide]  Medications: Prior to Admission medications   Medication Sig Start Date End Date Taking? Authorizing Provider  ALPRAZolam Duanne Moron) 1 MG tablet TAKE 1/2 TO 1 TABLET BY MOUTH AT BEDTIME AS NEEDED FOR ANXIETY 05/05/16  Yes Laurey Morale, MD  DULoxetine (CYMBALTA) 30 MG capsule Take 90 mg by mouth every evening.  04/17/15  Yes [provider]  Ergocalciferol (VITAMIN D2 PO) Take 50,000 Units by mouth every 7 (seven) days. Ergocalciferol Vitalmin D2 50,000  u capsule  SUNDAYS   Yes [provider]  ezetimibe (ZETIA) 10 MG tablet Take 10 mg by mouth daily. 09/16/16  Yes [provider]  gabapentin (NEURONTIN) 300 MG  capsule Take 1 capsule (300 mg total) by mouth 3 (three) times daily. Take 300 mg twice a day and 600 mg at bedtime 04/28/17  Yes Cross, [name] D, NP  losartan-hydrochlorothiazide (HYZAAR) 50-12.5 MG tablet TAKE 1 TABLET BY MOUTH EVERY DAY 04/12/17  Yes Laurey Morale, MD  NOVOLOG 100 UNIT/ML injection UNITS BY PUMP DEPENDS ON SUGAR READINGS, SLIDING SCALE 05/24/15  Yes [provider]  SUMAtriptan (IMITREX) 100 MG tablet TAKE 1 TABLET BY MOUTH AS NEEDED FOR MIGRAINES. MAY REPEAT IN 2 HOURS IF NEEDED 02/05/17  Yes Laurey Morale, MD  traMADol (ULTRAM) 50 MG tablet Take 1 tablet (50 mg total) by mouth every 6 (six) hours as needed. 04/16/17  Yes Cross, [name] D, NP  traZODone (DESYREL) 100 MG tablet Take 1 tablet by mouth at bedtime. 04/15/15  Yes [provider]  GLUCAGON EMERGENCY 1 MG injection Reported on 06/03/2015 03/08/14   [provider]  oxyCODONE (OXY IR/ROXICODONE) 5 MG immediate release tablet Take 1 tablet (5 mg total) by mouth every 4 (four) hours as needed for severe pain. 04/23/17   [name] D, NP     Family History  Problem Relation Age of Onset  . Alcohol abuse Father        Died age 50  . Heart disease Father   . Aneurysm Mother        Died age 57  . Leukemia Brother        Died age 17  . COPD Sister   . Rheum arthritis Sister   . Heart failure Sister   . Dementia Maternal Uncle   . Colon cancer Neg Hx     Social History   Socioeconomic History  . Marital status: Divorced    Spouse name: Not on file  . Number of children: 1  . Years of education: Not on file  . Highest education level: Not on file  Occupational History  . 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 Financial resource strain: Not on file  . Food insecurity:    Worry: Not on file    Inability: Not on file  . Transportation needs:    Medical: Not on file    Non-medical: Not on file  Tobacco Use  . Smoking status: Never Smoker  . Smokeless tobacco: Never Used    Substance and Sexual Activity  . Alcohol use: No    Alcohol/week: 0.0 oz  . Drug use: No  . Sexual activity: Not Currently    Birth control/protection: Post-menopausal  Lifestyle  . Physical activity:    Days per week: Not on file    Minutes per session: Not on file  . Stress: Not on file  Relationships  . Social connections:    Talks on phone: Not on file    Gets together: Not on file    Attends religious service: Not on file    Active member of club or organization: Not on file    Attends meetings of clubs or organizations: Not on file    Relationship status: Not on file  Other Topics Concern  . Not on file  Social History Narrative   Divorced.  Son and grandson live with her.  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  Review of Systems see above ;denies fever, headache, chest pain, dyspnea, cough, back pain, nausea, vomiting.  Does report some recent bleeding from both ears.  Vital Signs: BP 115/71 (BP Location: Right Arm)   Pulse 75   Temp 98.1 F (36.7 C) (Oral)   Resp 14   Ht 5\' 5"  (1.651 m)   Wt 232 lb 9.4 oz (105.5 kg)   SpO2 94%   BMI 38.70 kg/m   Physical Exam awake, alert.  Chest clear to auscultation bilaterally.  Heart with regular rate and rhythm.  Abdomen soft, obese, positive bowel sounds, mild pelvic tenderness to palpation.  No significant lower extremity edema.  Imaging: Ct Abdomen Pelvis W Contrast  Result Date: 04/29/2017 CLINICAL DATA:  2 weeks postop from hysterectomy, BSO, and lymph node dissection for endometrial carcinoma. Severe left pelvic and left leg pain. EXAM: CT ABDOMEN AND PELVIS WITH CONTRAST TECHNIQUE: Multidetector CT imaging of the abdomen and pelvis was performed using the standard protocol following bolus administration of intravenous contrast. CONTRAST:  141mL ISOVUE-300 IOPAMIDOL (ISOVUE-300) INJECTION 61% COMPARISON:  03/26/2017 FINDINGS: Lower Chest: Tiny sub-cm bilateral lower lobe pulmonary nodules  are stable. Hepatobiliary: No hepatic masses identified. Gallbladder is unremarkable. Pancreas:  No mass or inflammatory changes. Spleen: Within normal limits in size and appearance. Adrenals/Urinary Tract: No masses identified. No evidence of hydronephrosis. Unremarkable unopacified urinary bladder. Stomach/Bowel: No evidence of obstruction, inflammatory process or abnormal fluid collections. Vascular/Lymphatic: No pathologically enlarged lymph nodes. New multiloculated fluid collections are seen in the retroperitoneum in the left paraaortic and right pericaval spaces, with extension into the inferior perinephric spaces and common iliac chains bilaterally. Largest collection in the inferior left perinephric space measures 6.1 x 4.8 cm. These are consistent with postoperative lymphoceles. Reproductive: Prior hysterectomy. Adnexal regions are unremarkable in appearance. No fluid collections seen within hysterectomy bed or elsewhere within the pelvis. Other:  None. Musculoskeletal:  No suspicious bone lesions identified. IMPRESSION: Multiloculated bilateral retroperitoneal fluid collections, consistent with postoperative lymphoceles. No evidence of hydronephrosis or other complication. Electronically Signed   By: Earle Gell M.D.   On: 04/29/2017 14:01    Labs:  CBC: Recent Labs    04/08/17 1225 04/15/17 1310 04/16/17 0411 04/29/17 1654  WBC 6.2 8.0 6.3 8.3  HGB 12.8 12.3 11.0* 13.5  HCT 39.7 37.3 34.6* 41.4  PLT 249 210 207 248    COAGS: Recent Labs    04/29/17 1654  INR 0.94    BMP: Recent Labs    03/23/17 1057 04/08/17 1225 04/15/17 1310 04/16/17 0411 04/29/17 1654  NA 140 139  --  141 137  K 4.0 4.2  --  4.2 4.1  CL 102 101  --  105 100*  CO2 29 31  --  30 28  GLUCOSE 128 215*  --  136* 351*  BUN 17 18  --  14 15  CALCIUM 9.5 8.9  --  8.4* 9.2  CREATININE 0.83 0.73 0.72 0.82 0.86  GFRNONAA >60 >60 >60 >60 >60  GFRAA >60 >60 >60 >60 >60    LIVER FUNCTION TESTS: Recent  Labs    10/29/16 1105 04/08/17 1225 04/29/17 1654  BILITOT 0.4 0.2* 0.9  AST 20 39 65*  ALT 19 45 89*  ALKPHOS 88 90 95  PROT 6.2 6.4* 6.8  ALBUMIN 3.6 3.1* 3.3*    TUMOR MARKERS: No results for input(s): AFPTM, CEA, CA199, CHROMGRNA in the last 8760 hours.  Assessment and Plan: 67 y.o. 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  He now presents with week long history of pelvic pressure as well as progressive left greater than right lower extremity pain.  Subsequent imaging has revealed multiloculated bilateral retroperitoneal fluid collections. Request now received from GYN oncology for image guided drainage of the above-noted collections.  Imaging studies have been reviewed with Dr. Barbie Banner.Risks and benefits discussed with the patient including bleeding, infection, damage to adjacent structures, bowel perforation/fistula connection, and sepsis.  All of the patient's questions were answered, patient is agreeable to proceed. Consent signed and in chart.  Procedure scheduled for later this afternoon.   Thank you for this interesting consult.  I greatly enjoyed meeting Clista Rainford and look forward to participating in their care.  A copy of this report was sent to the requesting provider on this date.  Electronically Signed: D. Rowe Robert, PA-C 04/30/2017, 9:28 AM   I spent a total of 25 minutes  in face to face in clinical consultation, greater than 50% of which was counseling/coordinating care for CT-guided drainage of retroperitoneal fluid collections

## 2017-05-01 LAB — CBC
HCT: 37.4 % (ref 36.0–46.0)
HEMOGLOBIN: 12 g/dL (ref 12.0–15.0)
MCH: 28.4 pg (ref 26.0–34.0)
MCHC: 32.1 g/dL (ref 30.0–36.0)
MCV: 88.4 fL (ref 78.0–100.0)
PLATELETS: 187 10*3/uL (ref 150–400)
RBC: 4.23 MIL/uL (ref 3.87–5.11)
RDW: 14.4 % (ref 11.5–15.5)
WBC: 5.8 10*3/uL (ref 4.0–10.5)

## 2017-05-01 LAB — GLUCOSE, CAPILLARY
GLUCOSE-CAPILLARY: 164 mg/dL — AB (ref 65–99)
GLUCOSE-CAPILLARY: 52 mg/dL — AB (ref 65–99)
GLUCOSE-CAPILLARY: 145 mg/dL — AB (ref 65–99)
Glucose-Capillary: 94 mg/dL (ref 65–99)
Glucose-Capillary: 88 mg/dL (ref 65–99)
Glucose-Capillary: 69 mg/dL (ref 65–99)
Glucose-Capillary: 114 mg/dL — ABNORMAL HIGH (ref 65–99)

## 2017-05-01 LAB — BASIC METABOLIC PANEL
Anion gap: 7 (ref 5–15)
BUN: 16 mg/dL (ref 6–20)
CHLORIDE: 103 mmol/L (ref 101–111)
CO2: 32 mmol/L (ref 22–32)
Calcium: 8.7 mg/dL — ABNORMAL LOW (ref 8.9–10.3)
Creatinine, Ser: 0.89 mg/dL (ref 0.44–1.00)
GFR calc Af Amer: >60 mL/min (ref >60)
GLUCOSE: 50 mg/dL — AB (ref 65–99)
POTASSIUM: 4 mmol/L (ref 3.5–5.1)
SODIUM: 142 mmol/L (ref 135–145)

## 2017-05-01 MED ORDER — HYDROCORTISONE 1 % EX CREA
TOPICAL_CREAM | Freq: Two times a day (BID) | CUTANEOUS | Status: DC
  Administered 2017-05-01 – 2017-05-02 (×3): via TOPICAL
  Filled 2017-05-01: qty 28

## 2017-05-01 MED ORDER — ENOXAPARIN SODIUM 40 MG/0.4ML ~~LOC~~ SOLN
40.0000 mg | SUBCUTANEOUS | Status: DC
  Administered 2017-05-01 – 2017-05-02 (×2): 40 mg via SUBCUTANEOUS
  Filled 2017-05-01 (×2): qty 0.4

## 2017-05-01 MED ORDER — OXYCODONE HCL ER 15 MG PO T12A
15.0000 mg | EXTENDED_RELEASE_TABLET | Freq: Two times a day (BID) | ORAL | Status: DC
  Administered 2017-05-01 – 2017-05-02 (×3): 15 mg via ORAL
  Filled 2017-05-01 (×3): qty 1

## 2017-05-01 MED ORDER — SODIUM CHLORIDE 0.9 % IV BOLUS
1000.0000 mL | Freq: Once | INTRAVENOUS | Status: AC
  Administered 2017-05-01: 1000 mL via INTRAVENOUS

## 2017-05-01 MED ORDER — HYDROCORTISONE 2.5 % EX CREA: TOPICAL_CREAM | Freq: Two times a day (BID) | CUTANEOUS | Status: DC

## 2017-05-01 MED ORDER — DIPHENHYDRAMINE HCL 25 MG PO CAPS
25.0000 mg | ORAL_CAPSULE | Freq: Four times a day (QID) | ORAL | Status: DC | PRN
  Administered 2017-05-01 (×2): 25 mg via ORAL
  Filled 2017-05-01 (×2): qty 1

## 2017-05-01 MED ORDER — GABAPENTIN 300 MG PO CAPS
600.0000 mg | ORAL_CAPSULE | Freq: Two times a day (BID) | ORAL | Status: DC
  Administered 2017-05-01 – 2017-05-02 (×2): 600 mg via ORAL
  Filled 2017-05-01 (×2): qty 2

## 2017-05-01 MED ORDER — OXYCODONE HCL 5 MG PO TABS
10.0000 mg | ORAL_TABLET | ORAL | Status: DC | PRN
  Administered 2017-05-01 – 2017-05-02 (×4): 10 mg via ORAL
  Filled 2017-05-01 (×4): qty 2

## 2017-05-01 NOTE — Progress Notes (Signed)
Referring Physician(s): Cross,[name] D/Rossi,E  Supervising Physician: Jacqulynn Cadet  Patient Status:  [facility] - In-pt  Chief Complaint:  Left leg pain, endometrial cancer  Subjective: Pt doing ok; didn't sleep well last night due to PCA alarm; still having left leg pain but no paresthesias; had neg LE venous doppler recently; denies worsening flank pain,n/v or resp problems   Allergies: Armodafinil; Invokana [canagliflozin]; and Lasix [furosemide]  Medications: Prior to Admission medications   Medication Sig Start Date End Date Taking? Authorizing Provider  ALPRAZolam Duanne Moron) 1 MG tablet TAKE 1/2 TO 1 TABLET BY MOUTH AT BEDTIME AS NEEDED FOR ANXIETY 05/05/16  Yes Laurey Morale, MD  DULoxetine (CYMBALTA) 30 MG capsule Take 90 mg by mouth every evening.  04/17/15  Yes [provider]  Ergocalciferol (VITAMIN D2 PO) Take 50,000 Units by mouth every 7 (seven) days. Ergocalciferol Vitalmin D2 50,000 u capsule  SUNDAYS   Yes [provider]  ezetimibe (ZETIA) 10 MG tablet Take 10 mg by mouth daily. 09/16/16  Yes [provider]  gabapentin (NEURONTIN) 300 MG capsule Take 1 capsule (300 mg total) by mouth 3 (three) times daily. Take 300 mg twice a day and 600 mg at bedtime 04/28/17  Yes Cross, [name] D, NP  losartan-hydrochlorothiazide (HYZAAR) 50-12.5 MG tablet TAKE 1 TABLET BY MOUTH EVERY DAY 04/12/17  Yes Laurey Morale, MD  NOVOLOG 100 UNIT/ML injection UNITS BY PUMP DEPENDS ON SUGAR READINGS, SLIDING SCALE 05/24/15  Yes [provider]  SUMAtriptan (IMITREX) 100 MG tablet TAKE 1 TABLET BY MOUTH AS NEEDED FOR MIGRAINES. MAY REPEAT IN 2 HOURS IF NEEDED 02/05/17  Yes Laurey Morale, MD  traMADol (ULTRAM) 50 MG tablet Take 1 tablet (50 mg total) by mouth every 6 (six) hours as needed. 04/16/17  Yes Cross, [name] D, NP  traZODone (DESYREL) 100 MG tablet Take 1 tablet by mouth at bedtime. 04/15/15  Yes [provider]  GLUCAGON EMERGENCY 1 MG  injection Reported on 06/03/2015 03/08/14   [provider]  oxyCODONE (OXY IR/ROXICODONE) 5 MG immediate release tablet Take 1 tablet (5 mg total) by mouth every 4 (four) hours as needed for severe pain. 04/23/17   [name] D, NP     Vital Signs: BP (!) 94/48 (BP Location: Left Arm)   Pulse 68   Temp 98.1 F (36.7 C) (Oral)   Resp 11   Ht 5\' 5"  (1.651 m)   Wt 232 lb 9.4 oz (105.5 kg)   SpO2 92%   BMI 38.70 kg/m   Physical Exam right flank drain intact, insertion site ok, site nontender, output 20 cc blood-tinged fluid.  Left flank drain intact, insertion site okay, nontender, output 100 cc yellow fluid with fragments of tissue.  Both drains irrigated without significant difficulty.  Imaging: Ct Abdomen Pelvis W Contrast  Result Date: 04/29/2017 CLINICAL DATA:  2 weeks postop from hysterectomy, BSO, and lymph node dissection for endometrial carcinoma. Severe left pelvic and left leg pain. EXAM: CT ABDOMEN AND PELVIS WITH CONTRAST TECHNIQUE: Multidetector CT imaging of the abdomen and pelvis was performed using the standard protocol following bolus administration of intravenous contrast. CONTRAST:  120mL ISOVUE-300 IOPAMIDOL (ISOVUE-300) INJECTION 61% COMPARISON:  03/26/2017 FINDINGS: Lower Chest: Tiny sub-cm bilateral lower lobe pulmonary nodules are stable. Hepatobiliary: No hepatic masses identified. Gallbladder is unremarkable. Pancreas:  No mass or inflammatory changes. Spleen: Within normal limits in size and appearance. Adrenals/Urinary Tract: No masses identified. No evidence of hydronephrosis. Unremarkable unopacified urinary bladder. Stomach/Bowel: No  evidence of obstruction, inflammatory process or abnormal fluid collections. Vascular/Lymphatic: No pathologically enlarged lymph nodes. New multiloculated fluid collections are seen in the retroperitoneum in the left paraaortic and right pericaval spaces, with extension into the inferior perinephric spaces and common iliac  chains bilaterally. Largest collection in the inferior left perinephric space measures 6.1 x 4.8 cm. These are consistent with postoperative lymphoceles. Reproductive: Prior hysterectomy. Adnexal regions are unremarkable in appearance. No fluid collections seen within hysterectomy bed or elsewhere within the pelvis. Other:  None. Musculoskeletal:  No suspicious bone lesions identified. IMPRESSION: Multiloculated bilateral retroperitoneal fluid collections, consistent with postoperative lymphoceles. No evidence of hydronephrosis or other complication. Electronically Signed   By: Earle Gell M.D.   On: 04/29/2017 14:01   Ct Image Guided Drainage By Percutaneous Catheter  Result Date: 04/30/2017 INDICATION: Bilateral abdominal retroperitoneal abscess EXAM: CT GUIDED DRAINAGE OF ABDOMINAL ABSCESS X2 MEDICATIONS: The patient is currently admitted to the hospital and receiving intravenous antibiotics. The antibiotics were administered within an appropriate time frame prior to the initiation of the procedure. ANESTHESIA/SEDATION: Two mg IV Versed 100 mcg IV Fentanyl Moderate Sedation Time:  25 minutes The patient was continuously monitored during the procedure by the interventional radiology nurse under my direct supervision. COMPLICATIONS: None immediate. TECHNIQUE: Informed written consent was obtained from the patient after a thorough discussion of the procedural risks, benefits and alternatives. All questions were addressed. Maximal Sterile Barrier Technique was utilized including caps, mask, sterile gowns, sterile gloves, sterile drape, hand hygiene and skin antiseptic. A timeout was performed prior to the initiation of the procedure. PROCEDURE: The operative field was prepped with back in the prone position in a sterile fashion, and a sterile drape was applied covering the operative field. A sterile gown and sterile gloves were used for the procedure. Local anesthesia was provided with 1% Lidocaine. Under CT  guidance, an 18 gauge needle was inserted into the left retroperitoneal abdominal fluid collection and removed over an Amplatz wire. Ten [place] dilator followed by a 10 [place] drain were inserted. It was looped and string fixed then sewn to the skin. The identical procedure was performed for the right retroperitoneal abdominal fluid collection. Cloudy yellow fluid was aspirated from each cavity and sent for culture. FINDINGS: Imaging demonstrates placement of bilateral 10 French abdominal abscess drains. IMPRESSION: Successful abdominal abscess drain x2. Electronically Signed   By: Marybelle Killings M.D.   On: 04/30/2017 17:58   Ct Image Guided Drainage By Percutaneous Catheter  Result Date: 04/30/2017 INDICATION: Bilateral abdominal retroperitoneal abscess EXAM: CT GUIDED DRAINAGE OF ABDOMINAL ABSCESS X2 MEDICATIONS: The patient is currently admitted to the hospital and receiving intravenous antibiotics. The antibiotics were administered within an appropriate time frame prior to the initiation of the procedure. ANESTHESIA/SEDATION: Two mg IV Versed 100 mcg IV Fentanyl Moderate Sedation Time:  25 minutes The patient was continuously monitored during the procedure by the interventional radiology nurse under my direct supervision. COMPLICATIONS: None immediate. TECHNIQUE: Informed written consent was obtained from the patient after a thorough discussion of the procedural risks, benefits and alternatives. All questions were addressed. Maximal Sterile Barrier Technique was utilized including caps, mask, sterile gowns, sterile gloves, sterile drape, hand hygiene and skin antiseptic. A timeout was performed prior to the initiation of the procedure. PROCEDURE: The operative field was prepped with back in the prone position in a sterile fashion, and a sterile drape was applied covering the operative field. A sterile gown and sterile gloves were used for the procedure. Local anesthesia was  provided with 1% Lidocaine. Under CT  guidance, an 18 gauge needle was inserted into the left retroperitoneal abdominal fluid collection and removed over an Amplatz wire. Ten [place] dilator followed by a 10 [place] drain were inserted. It was looped and string fixed then sewn to the skin. The identical procedure was performed for the right retroperitoneal abdominal fluid collection. Cloudy yellow fluid was aspirated from each cavity and sent for culture. FINDINGS: Imaging demonstrates placement of bilateral 10 French abdominal abscess drains. IMPRESSION: Successful abdominal abscess drain x2. Electronically Signed   By: Marybelle Killings M.D.   On: 04/30/2017 17:58    Labs:  CBC: Recent Labs    04/15/17 1310 04/16/17 0411 04/29/17 1654 05/01/17 1040  WBC 8.0 6.3 8.3 5.8  HGB 12.3 11.0* 13.5 12.0  HCT 37.3 34.6* 41.4 37.4  PLT 210 207 248 187    COAGS: Recent Labs    04/29/17 1654  INR 0.94    BMP: Recent Labs    04/08/17 1225 04/15/17 1310 04/16/17 0411 04/29/17 1654 05/01/17 1040  NA 139  --  141 137 142  K 4.2  --  4.2 4.1 4.0  CL 101  --  105 100* 103  CO2 31  --  30 28 32  GLUCOSE 215*  --  136* 351* 50*  BUN 18  --  14 15 16   CALCIUM 8.9  --  8.4* 9.2 8.7*  CREATININE 0.73 0.72 0.82 0.86 0.89  GFRNONAA >60 >60 >60 >60 >60  GFRAA >60 >60 >60 >60 >60    LIVER FUNCTION TESTS: Recent Labs    10/29/16 1105 04/08/17 1225 04/29/17 1654  BILITOT 0.4 0.2* 0.9  AST 20 39 65*  ALT 19 45 89*  ALKPHOS 88 90 95  PROT 6.2 6.4* 6.8  ALBUMIN 3.6 3.1* 3.3*    Assessment and Plan: Patient status post hysterectomy with left salpingo-oophorectomy, sentinel lymph node biopsy, periaortic lymphadenectomy on 3/21 for carcinosarcoma of the endometrium; status post drainage of left and right retroperitoneal fluid collections on 4/5.  Afebrile, BP still remains a little soft, creatinine normal, CBC normal, fluid cultures pending.  Continue to watch output carefully and once minimal check follow-up CT ; could consider MRI L  spine/sacrum if LLE pain persists   Electronically Signed: D. Rowe Robert, PA-C 05/01/2017, 2:00 PM   I spent a total of 15 minutes at the the patient's bedside AND on the patient's hospital floor or unit, greater than 50% of which was counseling/coordinating care for bilateral retroperitoneal drains    Patient ID: Katie Palmer, female   DOB: [birth year], 68 y.o.   MRN: [id number]

## 2017-05-01 NOTE — Progress Notes (Signed)
  GYN ONC  Subjective: Had fluid collections bilaterally drained last night. 300+ from drains in past 12 hours. Clear fluid (not apparently infected). Patient is having a hard time - pain poorly controlled by PCA which keeps her awake. Pain still exists in left groin through thigh through lower leg.  Tolerating PO. + flatus. + ambulating.   Objective: Vital signs in last 24 hours: Temp:  [97.3 F (36.3 C)-98.3 F (36.8 C)] 98.1 F (36.7 C) (04/06 0357) Pulse Rate:  [55-77] 68 (04/06 0913) Resp:  [10-18] 11 (04/06 0728) BP: (82-129)/(46-89) 94/48 (04/06 0913) SpO2:  [92 %-100 %] 92 % (04/06 0913) Last BM Date: 04/30/17  Intake/Output from previous day: 04/05 0701 - 04/06 0700 In: 1491.5 [P.O.:120; I.V.:361.5; IV Piggyback:1000] Out: 550 [Drains:550]  Physical Examination: General: alert, cooperative, fatigued, mild distress and generally uncomfortable Resp: clear to auscultation bilaterally Cardio: regular rate and rhythm, S1, S2 normal, no murmur, click, rub or gallop GI: soft, non-tender; bowel sounds normal; no masses,  no organomegaly and incision: all intact and healing normally. The drains are clean at exit sites Extremities: extremities normal, atraumatic, no cyanosis or edema and no peripheral signs on left lower extremity to explain pain  Labs:      Assessment: 67 y.o. s/p : stable Pain:  Pain is not well controlled on PCA due to short acting and disturbance of respiratory monitor. Will change today to oxycontin 15mg  BID and oxycodone 10mg  prn.  Heme: Hgb 13.5 and Hct 41.4 4/4.  No clinical manifestation of active bleeding.   CV: Was hypotensive last night - likely due to NPO all day for procedure. Responded to fluid bolus x 1. Recheck CBC.   GI:  Tolerating po: Yes.  Antiemetics ordered PRN.  GU: Voiding without difficulty.    FEN: Recheck electrolytes today. Hep locked. Replace prn.   Endo: Diabetes mellitus Type I, under fair control..  CBG:  CBG (last  3)  Recent Labs    04/30/17 2102 05/01/17 0354 05/01/17 0745  GLUCAP 139* 164* 88   Patient using insulin pump appropriately.  Prophylaxis: Lovenox ordered.  SCDs ordered but patient unable to tolerate due to discomfort in her legs.  TED hose ordered last pm but not on patient at this time.  Plan: Transition to PO meds with 15mg  oxycontin and 10mg  prn oxycodone. D.C. PCA. Benadryl and topical hydrocortisone for itch  Recheck labs today Continue regular diet. Anticipate discharge when patient's pain is better controlled. If pain not improved tomorrow, consider repeat CT scan to evaluate for occult source of pain or undrained collections.   LOS: 1 day    Thereasa Solo 05/01/2017, 10:05 AM

## 2017-05-01 NOTE — Plan of Care (Signed)
Patient stable during 7 a to 7 p shift, pain improved after starting on oxycodone, reduced from an 8 to a 3.  This RN removed dressings from around drains, skin very red and itchy from adhesives.  Cleaned area around drain, minimized tape and applied hydrocortisone cream to itching red areas.  Patient voiced improvement after this.  Patient up walking in room, says ambulation does not increase leg pain.  Fluid and tissue from left drain sent to lab as per order from IR.  Patient wanting to shower, call placed to Dr. Denman George for order to shower, awaiting approval for this.

## 2017-05-01 NOTE — Progress Notes (Signed)
Blood pressure 89/46 with a pulse at 66. Pt is alert and orientedx4, up talking in bed. Upon assessment drainage from right drain is now blood-tinged. Dr. Denman George called and notified. Instructed to give 1L bolus of normal saline. Fluids started. Pt states that she feels fine, is in no obvious distress at this time. Will continue to closely monitor.

## 2017-05-02 DIAGNOSIS — G8918 Other acute postprocedural pain: Secondary | ICD-10-CM | POA: Diagnosis present

## 2017-05-02 LAB — CBC
HCT: 39.7 % (ref 36.0–46.0)
HEMOGLOBIN: 12.8 g/dL (ref 12.0–15.0)
MCH: 28.1 pg (ref 26.0–34.0)
MCHC: 32.2 g/dL (ref 30.0–36.0)
MCV: 87.3 fL (ref 78.0–100.0)
PLATELETS: 188 10*3/uL (ref 150–400)
RBC: 4.55 MIL/uL (ref 3.87–5.11)
RDW: 14.1 % (ref 11.5–15.5)
WBC: 5.6 10*3/uL (ref 4.0–10.5)

## 2017-05-02 LAB — BASIC METABOLIC PANEL
Anion gap: 7 (ref 5–15)
BUN: 17 mg/dL (ref 6–20)
CALCIUM: 8.7 mg/dL — AB (ref 8.9–10.3)
CHLORIDE: 103 mmol/L (ref 101–111)
CO2: 31 mmol/L (ref 22–32)
CREATININE: 0.77 mg/dL (ref 0.44–1.00)
Glucose, Bld: 83 mg/dL (ref 65–99)
Potassium: 3.9 mmol/L (ref 3.5–5.1)
SODIUM: 141 mmol/L (ref 135–145)

## 2017-05-02 LAB — GLUCOSE, CAPILLARY
GLUCOSE-CAPILLARY: 73 mg/dL (ref 65–99)
GLUCOSE-CAPILLARY: 230 mg/dL — AB (ref 65–99)

## 2017-05-02 LAB — CREATININE, BODY FLUID OTHER: CREATININE, BODY FLUID: 0.6 mg/dL

## 2017-05-02 MED ORDER — GABAPENTIN 300 MG PO CAPS: 600.0000 mg | ORAL_CAPSULE | Freq: Three times a day (TID) | ORAL | 0 refills | Status: DC

## 2017-05-02 MED ORDER — MORPHINE SULFATE 15 MG PO TABS: 15.0000 mg | ORAL_TABLET | ORAL | 0 refills | Status: DC | PRN

## 2017-05-02 MED ORDER — HYDROCORTISONE 1 % EX CREA: TOPICAL_CREAM | Freq: Two times a day (BID) | CUTANEOUS | 0 refills | Status: DC

## 2017-05-02 MED ORDER — MORPHINE SULFATE ER 15 MG PO TBCR: 15.0000 mg | EXTENDED_RELEASE_TABLET | Freq: Two times a day (BID) | ORAL | 0 refills | Status: DC

## 2017-05-02 MED ORDER — SENNA 8.6 MG PO TABS: 1.0000 | ORAL_TABLET | Freq: Every day | ORAL | 0 refills | Status: DC

## 2017-05-02 NOTE — Discharge Summary (Signed)
Physician Discharge Summary  Patient ID: Katie Palmer MRN: [id number] DOB/AGE: August 29, 1950 67 y.o.  Admit date: 04/29/2017 Discharge date: 05/02/2017  Admission Diagnoses: Acute leg pain, left  Discharge Diagnoses:  Principal Problem:   Acute leg pain, left Active Problems:   Pelvic fluid collection   Left leg pain   Acute postoperative pain   Discharged Condition: fair  Hospital Course:  1/ patient was admitted on 04/29/17 for worsening left thigh through foot lower extremity pain postop. CT scan revealed bilateral retroperitoneal lymphocysts. It was felt that her leg pain was secondary to nerve irritation from retroperitoneal pressure 2/ She received PCA analgesia (which provided limited relief). This was changed to oral oxycontin 15mg  BID and oxycodone q 3 hours prn with much improved pain relief. 3/ On 04/30/17 Interventional Radiology performed bilateral placement of percutaneous drains in the lymphocysts. High output (>300cc) per day was yielded from the drains  The fluid was clear. Cultures negative to date. Creatinine consistent with body fluid (not urine).  3/ on hospital day 3 the patient was meeting discharge criteria: tolerating PO, voiding urine, ambulating, pain adequately controlled on oral medications.  4/ new medications on discharge include MS contin 15mg  BID, morphine sulfate immediate release 15mg  q 4 hours prn (these are covered by insurance, while oxycodone/contin not). Increased dose of gabapentin to 600mg  TID.  We will have her drains interrogated by IR for consideration of removal as an outpatient. She will undergo repeat CT as an outpatient to evaluate resolution of the collections and alternative source of pain given persistence even with drainage.   Consults: interventional radiology  Significant Diagnostic Studies: labs:  CBC    Component Value Date/Time   WBC 5.6 05/02/2017 0509   RBC 4.55 05/02/2017 0509   HGB 12.8 05/02/2017 0509   HCT 39.7 05/02/2017  0509   PLT 188 05/02/2017 0509   MCV 87.3 05/02/2017 0509   MCH 28.1 05/02/2017 0509   MCHC 32.2 05/02/2017 0509   RDW 14.1 05/02/2017 0509   LYMPHSABS 1.5 10/29/2016 1105   MONOABS 0.5 10/29/2016 1105   EOSABS 0.2 10/29/2016 1105   BASOSABS 0.0 10/29/2016 1105   BMET    Component Value Date/Time   NA 141 05/02/2017 0509   K 3.9 05/02/2017 0509   CL 103 05/02/2017 0509   CO2 31 05/02/2017 0509   GLUCOSE 83 05/02/2017 0509   BUN 17 05/02/2017 0509   CREATININE 0.77 05/02/2017 0509   CALCIUM 8.7 (L) 05/02/2017 0509   GFRNONAA >60 05/02/2017 0509   GFRAA >60 05/02/2017 0509    and radiology: CT scan: multiloculated fluid collections seen in teh bilateral retroperitoneum with the largest measuring 6.1cm on the left perinephric space. On 04/29/17.   Treatments: IV hydration, analgesia: Dilaudid and changed to oxycontin and oxycodone, with gabapentin and Interventional Radiology drainage of bilateral collections.  Discharge Exam: Blood pressure 127/71, pulse 69, temperature 98.3 F (36.8 C), temperature source Oral, resp. rate 17, height 5\' 5"  (1.651 m), weight 232 lb 9.4 oz (105.5 kg), SpO2 95 %. General appearance: alert and cooperative GI: soft, non-tender; bowel sounds normal; no masses,  no organomegaly Extremities: extremities normal, atraumatic, no cyanosis or edema and no visible lower extremity edema, masses, or tenderness on palation Skin: rash on left low back in distribution of adhesive tape from dressing Neurologic: Grossly normal with no apparent sensory or motor loss in the left lower extermity.  Normal pulses in the left lower extremity.   Disposition: Discharge disposition: 01-Home or Self Care  Discharge Instructions    (HEART FAILURE PATIENTS) Call MD:  Anytime you have any of the following symptoms: 1) 3 pound weight gain in 24 hours or 5 pounds in 1 week 2) shortness of breath, with or without a dry hacking cough 3) swelling in the hands, feet or  stomach 4) if you have to sleep on extra pillows at night in order to breathe.   Complete by:  As directed    Call MD for:  difficulty breathing, headache or visual disturbances   Complete by:  As directed    Call MD for:  extreme fatigue   Complete by:  As directed    Call MD for:  hives   Complete by:  As directed    Call MD for:  persistant dizziness or light-headedness   Complete by:  As directed    Call MD for:  persistant nausea and vomiting   Complete by:  As directed    Call MD for:  redness, tenderness, or signs of infection (pain, swelling, redness, odor or green/yellow discharge around incision site)   Complete by:  As directed    Call MD for:  severe uncontrolled pain   Complete by:  As directed    Call MD for:  temperature >100.4   Complete by:  As directed    Diet - low sodium heart healthy   Complete by:  As directed    Diet general   Complete by:  As directed    Driving Restrictions   Complete by:  As directed    No driving for 7 days or until off narcotic pain medication   Increase activity slowly   Complete by:  As directed    Remove dressing in 24 hours   Complete by:  As directed    Sexual Activity Restrictions   Complete by:  As directed    No intercourse for 6 weeks     Allergies as of 05/02/2017      Reactions   Armodafinil    Flu-like symptoms   Invokana [canagliflozin] Other (See Comments)   "shut down pancreas"    Lasix [furosemide] Diarrhea   Severe diarrhea      Medication List    STOP taking these medications   oxyCODONE 5 MG immediate release tablet Commonly known as:  Oxy IR/ROXICODONE     TAKE these medications   ALPRAZolam 1 MG tablet Commonly known as:  XANAX TAKE 1/2 TO 1 TABLET BY MOUTH AT BEDTIME AS NEEDED FOR ANXIETY   DULoxetine 30 MG capsule Commonly known as:  CYMBALTA Take 90 mg by mouth every evening.   ezetimibe 10 MG tablet Commonly known as:  ZETIA Take 10 mg by mouth daily.   gabapentin 300 MG  capsule Commonly known as:  NEURONTIN Take 2 capsules (600 mg total) by mouth 3 (three) times daily. Take 600 mg three times a day What changed:    how much to take  additional instructions   GLUCAGON EMERGENCY 1 MG injection Generic drug:  glucagon Reported on 06/03/2015   hydrocortisone cream 1 % Apply topically 2 (two) times daily.   losartan-hydrochlorothiazide 50-12.5 MG tablet Commonly known as:  HYZAAR TAKE 1 TABLET BY MOUTH EVERY DAY   morphine 15 MG 12 hr tablet Commonly known as:  MS CONTIN Take 1 tablet (15 mg total) by mouth every 12 (twelve) hours.   morphine 15 MG tablet Commonly known as:  MSIR Take 1 tablet (15 mg total) by mouth every 4 (four)  hours as needed for severe pain.   NOVOLOG 100 UNIT/ML injection Generic drug:  insulin aspart UNITS BY PUMP DEPENDS ON SUGAR READINGS, SLIDING SCALE   senna 8.6 MG Tabs tablet Commonly known as:  SENOKOT Take 1 tablet (8.6 mg total) by mouth at bedtime.   SUMAtriptan 100 MG tablet Commonly known as:  IMITREX TAKE 1 TABLET BY MOUTH AS NEEDED FOR MIGRAINES. MAY REPEAT IN 2 HOURS IF NEEDED   traMADol 50 MG tablet Commonly known as:  ULTRAM Take 1 tablet (50 mg total) by mouth every 6 (six) hours as needed.   traZODone 100 MG tablet Commonly known as:  DESYREL Take 1 tablet by mouth at bedtime.   VITAMIN D2 PO Take 50,000 Units by mouth every 7 (seven) days. Ergocalciferol Vitalmin D2 50,000 u capsule  SUNDAYS      Follow-up Information    Heath Lark, MD Follow up in 1 week(s).   Specialty:  Hematology and Oncology Contact information: [place] [address] [phone]        Everitt Amber, MD Follow up in 1 week(s).   Specialty:  Obstetrics and Gynecology Why:  as scheduled Contact information: [place] [address] [phone]           Signed: Thereasa Solo 05/02/2017, 11:31 AM

## 2017-05-02 NOTE — Discharge Instructions (Signed)
05/02/2017  Return to work: 4 weeks  Activity: 1. Be up and out of the bed during the day.  Take a nap if needed.  You may walk up steps but be careful and use the hand rail.  Stair climbing will tire you more than you think, you may need to stop part way and rest.   2. No lifting or straining for 4 weeks.  3. It is safe to drive. Do Not drive if you are taking narcotic pain medicine.  4. Shower daily.  Use soap and water on your incision and pat dry; don't rub. Keep your back dressings dry if possible, or change if wet.  5. No sexual activity and nothing in the vagina for 7 weeks.  Medications:  - Take ibuprofen and tylenol first line for pain control. Take these regularly (every 6 hours) to decrease the build up of pain.  - Twice daily take MS Contin slow release pain medication. - every 4 hours as needed in between, take Morphine Sulfate immediate release tablets.   - While taking Morphine and MS Contin you should take sennakot every night to reduce the likelihood of constipation. If this causes diarrhea, stop its use.  Diet: 1. Low sodium Heart Healthy Diet is recommended.  2. It is safe to use a laxative if you have difficulty moving your bowels.   Wound Care: 1. Keep clean and dry.  Shower daily. But keep back dressings dry (or change). 2. EMPTY AND RECORD DRAIN OUTPUT (from each side individually) AT LEAST TWICE DAILY, OR AS NEEDED IF BAGS BECOME FULL.  Reasons to call the Doctor:   Blocked drains (or drains no longer draining).  Fever - Oral temperature greater than 100.4 degrees Fahrenheit  Foul-smelling vaginal discharge  Difficulty urinating  Nausea and vomiting  Increased pain at the site of the incision, drains or legs that is unrelieved with pain medicine.  Difficulty breathing with or without chest pain  New calf pain especially if only on one side  Sudden, continuing increased vaginal bleeding with or without clots.   Follow-up: 1. See Everitt Amber in  1 weeks. 2. We will schedule you for a follow-up with interventional radiology to determine the removal of the drains. 3. We will schedule you for a CT scan later this week to evaluate for resolution of the fluid collections or alternative source for persistent pain.   Contacts: For questions or concerns you should contact:  Dr. Everitt Amber at [phone] After hours and on week-ends call [phone] and ask to speak to the physician on call for Gynecologic Oncology

## 2017-05-03 ENCOUNTER — Other Ambulatory Visit: Payer: Self-pay | Admitting: Gynecologic Oncology

## 2017-05-03 ENCOUNTER — Inpatient Hospital Stay: Payer: Medicare Other | Attending: Gynecologic Oncology | Admitting: Hematology and Oncology

## 2017-05-03 ENCOUNTER — Encounter: Payer: Self-pay | Admitting: Hematology and Oncology

## 2017-05-03 ENCOUNTER — Other Ambulatory Visit: Payer: Self-pay | Admitting: Radiology

## 2017-05-03 ENCOUNTER — Other Ambulatory Visit: Payer: Self-pay | Admitting: Hematology and Oncology

## 2017-05-03 ENCOUNTER — Telehealth: Payer: Self-pay | Admitting: Hematology and Oncology

## 2017-05-03 DIAGNOSIS — R188 Other ascites: Secondary | ICD-10-CM

## 2017-05-03 DIAGNOSIS — C541 Malignant neoplasm of endometrium: Secondary | ICD-10-CM | POA: Diagnosis present

## 2017-05-03 DIAGNOSIS — Z9889 Other specified postprocedural states: Secondary | ICD-10-CM | POA: Insufficient documentation

## 2017-05-03 DIAGNOSIS — IMO0001 Reserved for inherently not codable concepts without codable children: Secondary | ICD-10-CM

## 2017-05-03 DIAGNOSIS — Z794 Long term (current) use of insulin: Secondary | ICD-10-CM | POA: Insufficient documentation

## 2017-05-03 DIAGNOSIS — E1139 Type 2 diabetes mellitus with other diabetic ophthalmic complication: Secondary | ICD-10-CM

## 2017-05-03 DIAGNOSIS — R1084 Generalized abdominal pain: Secondary | ICD-10-CM

## 2017-05-03 DIAGNOSIS — E1010 Type 1 diabetes mellitus with ketoacidosis without coma: Secondary | ICD-10-CM | POA: Diagnosis not present

## 2017-05-03 DIAGNOSIS — I10 Essential (primary) hypertension: Secondary | ICD-10-CM | POA: Insufficient documentation

## 2017-05-03 NOTE — Assessment & Plan Note (Signed)
She has insulin-dependent diabetes I plan to reduce her premedication dexamethasone due to anticipated severe hypoglycemia with treatment

## 2017-05-03 NOTE — Assessment & Plan Note (Signed)
She has drain in situ I will wait until drains are removed before placing orders for port

## 2017-05-03 NOTE — Progress Notes (Signed)
START ON PATHWAY REGIMEN - Uterine     A cycle is every 21 days:     Paclitaxel      Carboplatin   **Always confirm dose/schedule in your pharmacy ordering system**    Patient Characteristics: Carcinosarcomas, Newly Diagnosed, Resected Histology: Carcinosarcoma Therapeutic Status: Newly Diagnosed AJCC T Category: T1 AJCC N Category: N0 AJCC M Category: M0 AJCC 8 Stage Grouping: I Surgical Status: Resected Intent of Therapy: Curative Intent, Discussed with Patient

## 2017-05-03 NOTE — Assessment & Plan Note (Signed)
We discussed the role of adjuvant treatment Due to recent abscess/potential infection, I would delay initiation of chemotherapy until the first week of May I will follow up on plan CT imaging in 3 days If her CT is negative and drains are removed, I will put in order for port placement Plan to see her back in the first week of May with blood work, chemo education class and chemotherapy consent with plan to start first dose of chemotherapy around May 27, 2017 We briefly discussed potential side effects of treatment.

## 2017-05-03 NOTE — Telephone Encounter (Signed)
Gave patient AVs and calendar of upcoming appointments,

## 2017-05-03 NOTE — Progress Notes (Signed)
CT per Dr. Denman George to re-evaluate pelvic fluid collections and acute post-op pain s/p drain placement.

## 2017-05-03 NOTE — Progress Notes (Signed)
[place] CONSULT NOTE  Patient Care Team: Laurey Morale, MD as PCP - General  ASSESSMENT & PLAN:  Endometrial cancer [facility]) We discussed the role of adjuvant treatment Due to recent abscess/potential infection, I would delay initiation of chemotherapy until the first week of May I will follow up on plan CT imaging in 3 days If her CT is negative and drains are removed, I will put in order for port placement Plan to see her back in the first week of May with blood work, chemo education class and chemotherapy consent with plan to start first dose of chemotherapy around May 27, 2017 We briefly discussed potential side effects of treatment.  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    CHIEF COMPLAINTS/PURPOSE OF CONSULTATION:  Endometrial cancer, for adjuvant treatment  HISTORY OF PRESENTING ILLNESS:  Katie Palmer 67 y.o. female is here because of recent diagnosis of endometrial cancer.  I have reviewed her chart and materials related to her cancer extensively and collaborated history with the patient. Summary of oncologic history is as follows: Oncology History   High grade carcinosarcoma     Endometrial cancer ([place])   12/25/2015 Pathology Results    1. Ovary and fallopian tube, right - BENIGN OVARIAN FIBROTHECOMA. - NO ENDOMETRIOSIS OR MALIGNANCY. - BENIGN FALLOPIAN TUBE WITH BENIGN PARATUBAL CYST. 2. Endometrium, curettage - INACTIVE ENDOMETRIUM, BENIGN ENDOMETRIAL POLYP AND BENIGN SQUAMOUS FRAGMENTS. - NO HYPERPLASIA OR MALIGNANCY      12/25/2015 Surgery    PRE-OPERATIVE DIAGNOSIS:  RLQ PAIN, thickened  endometrium  POST-OPERATIVE DIAGNOSIS:  R O lesion, adhesions, polyp in uterus  PROCEDURE:  Procedure(s): DILATATION AND CURETTAGE /HYSTEROSCOPY (N/A) LAPAROSCOPY OPERATIVE (N/A) LAPAROSCOPIC LYSIS OF ADHESIONS (N/A) LAPAROSCOPIC UNILATERAL SALPINGO OOPHORECTOMY (Right        01/25/2017 Initial Diagnosis    The patient reports postmenopausal bleeding since December, 2018. Pap smear showed AGUS.         03/17/2017 Pathology Results    Endometrial biopsy on 03/17/17 showed grade 3 endometrial adenocarcinoma.      03/26/2017 Imaging    Ct abdomen Diffuse endometrial thickening consistent with endometrial carcinoma, with probable focal area of deep myometrial invasion in the anterior fundus.  No evidence of extra uterine tumor extension, lymphadenopathy, or distant metastatic disease.  Two tiny bilateral lower lobe pulmonary nodules, largest measuring 5 mm. These are indeterminate, but likely benign. Recommend follow-up with chest CT without contrast in 3-6 months.      04/15/2017 Surgery    Operation: Robotic-assisted laparoscopic total hysterectomy with left salpingoophorectomy, SLN biopsy, para-aortic lymphadenectomy (bilateral), vaginal biopsy.  Surgeon: Donaciano Eva  Operative Findings:  : 35mm nodule on left proximal (upper) vaginal side wall (benign on frozen). 8cm normal appearing uterus, surgically absent right ovary and tube, normal left ovary somewhat adherent to round ligament. Indurated left external iliac SLN. No other suspicious nodes or apparent extrauterine disease        04/15/2017 Pathology Results    1. Vagina, biopsy - ATYPICAL CELLS, SUSPICIOUS BUT NOT DIAGNOSTIC OF MALIGNANCY. SEE NOTE 2. Lymph node, sentinel, biopsy, right presacral #1 - LYMPH NODE, NEGATIVE FOR MALIGNANCY (0/1) - IMMUNOSTAIN FOR PANKERATIN SHOWS FOCAL NONSPECIFIC LABELING  3. Lymph node, sentinel, biopsy, right presacral #2 - LYMPH NODE, NEGATIVE FOR MALIGNANCY (0/1) -  IMMUNOSTAIN FOR PANKERATIN IS NEGATIVE 4. Lymph node, sentinel, biopsy, left external iliac - LYMPH NODE, NEGATIVE FOR MALIGNANCY (0/1) - IMMUNOSTAIN FOR PANKERATIN IS NEGATIVE 5. Lymph node, biopsy, right periaortic - TWO LYMPH NODES, NEGATIVE FOR MALIGNANCY (0/2) 6. Lymph node, biopsy, left periaortic - SEVEN LYMPH NODES, NEGATIVE FOR MALIGNANCY (0/7) 7. 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 SEE NOTE - TUMOR INVADES A LITTLE OVER HALF OF THE MYOMETRIUM (INVASION DEPTH OF 0.7 CM WHERE MYOMETRIUM IS 1.3 CM THICK) 1. Vagina, biopsy - ATYPICAL CELLS, SUSPICIOUS BUT NOT DIAGNOSTIC OF MALIGNANCY. SEE NOTE 2. Lymph node, sentinel, biopsy, right presacral #1 - LYMPH NODE, NEGATIVE FOR MALIGNANCY (0/1) - IMMUNOSTAIN FOR PANKERATIN SHOWS FOCAL NONSPECIFIC LABELING 3. Lymph node, sentinel, biopsy, right presacral #2 - LYMPH NODE, NEGATIVE FOR MALIGNANCY (0/1) - IMMUNOSTAIN FOR PANKERATIN IS NEGATIVE 4. Lymph node, sentinel, biopsy, left external iliac - LYMPH NODE, NEGATIVE FOR MALIGNANCY (0/1) - IMMUNOSTAIN FOR PANKERATIN IS NEGATIVE 5. Lymph node, biopsy, right periaortic - TWO LYMPH NODES, NEGATIVE FOR MALIGNANCY (0/2) 6. Lymph node, biopsy, left periaortic - SEVEN LYMPH NODES, NEGATIVE FOR MALIGNANCY (0/7) 7. 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 SEE NOTE - TUMOR INVADES A LITTLE OVER HALF OF THE MYOMETRIUM (INVASION DEPTH OF 0.7 CM WHERE MYOMETRIUM IS 1.3 CM THICK)  7. ONCOLOGY TABLE-UTERUS, CARCINOMA OR CARCINOSARCOMA 1 of 4 Duplicate copy FINAL for Palmer, Katie [phone]) Microscopic Comment(continued) Specimen: Uterus with left fallopian tube and ovary Procedure: Total hysterectomy with left  salpingoophorectomy Lymph node sampling performed: Sentinel and non-sentinel Specimen integrity: Intact Maximum tumor size: 4.2 cm Histologic type: Carcinosarcoma (Malignant mullerian mixed tumor) Grade: High-grade Myometrial invasion: 0.7 cm where myometrium is 1.3 cm in thickness Cervical stromal involvement: Not identified Extent of involvement of other organs: Not involved Lymph - vascular invasion: Not identified Peritoneal washings: NA Lymph nodes: Examined: 3 Sentinel 9 Non-sentinel 12 Total Lymph nodes with metastasis: 0 Isolated tumor cells (< 0.2 mm): 0 Micrometastasis: (> 0.2 mm and < 2.0 mm): 0 Macrometastasis: (> 2.0 mm): 0 Extracapsular extension: NA TNM code: pT1b, pN0 FIGO Stage (based on pathologic findings, needs clinical correlation): 1B      04/29/2017 Imaging    CT abdomen Multiloculated bilateral retroperitoneal fluid collections, consistent with postoperative lymphoceles.  No evidence of hydronephrosis or other complication      04/27/7060 - 05/02/2017 Hospital Admission    She was admitted for evaluation of severe pain. CT showed fluid collection and IR was consulted. Drain was placed to drain the fluid collection      04/30/2017 Cancer Staging    Staging form: Corpus Uteri - Carcinoma and Carcinosarcoma, AJCC 8th Edition - Pathologic: Stage IB (pT1b, pN0, cM0) - Signed by Heath Lark, MD on 04/30/2017      04/30/2017 Procedure    Successful abdominal abscess drain x2.      She was just discharged yesterday for management of pelvic fluid collection She complained of recent bleeding at the drainage site. No fevers or chills. Pain is minimal. She has lost some weight since recent surgery She denies baseline neuropathy from DM but has retinopathy  MEDICAL HISTORY:  Past Medical History:  Diagnosis Date  . Depression   .  Fibromyalgia   . Headache(784.0)   . History of colon polyps   . History of diabetes with ketoacidosis   . Hx of colonic polyps    . Hypertension   . Insomnia   . Insulin pump in place   . RBBB (right bundle branch block)   . RLQ abdominal pain   . Thickened endometrium   . Type 1 diabetes mellitus with long-term current use of insulin [facility]) endocrinologist-- dr Chalmers Cater   July 2015--  pt took Invokana and caused complete pancreas shut-down , pt went from pre-diabetic to adult type 1 dm now insulin dependent  . Vertigo     SURGICAL HISTORY: Past Surgical History:  Procedure Laterality Date  . CARDIOVASCULAR STRESS TEST  07/26/2015   normal myocardial perfusion study w/ no ischemia/  normal LV function and wall motion , ef 71%  . COLONOSCOPY WITH PROPOFOL  last one 10/ 2017  . HYSTEROSCOPY W/D&C N/A 12/25/2015   Procedure: DILATATION AND CURETTAGE /HYSTEROSCOPY;  Surgeon: Bobbye Charleston, MD;  Location: [facility];  Service: Gynecology;  Laterality: N/A;  . LAPAROSCOPIC LYSIS OF ADHESIONS N/A 12/25/2015   Procedure: LAPAROSCOPIC LYSIS OF ADHESIONS;  Surgeon: Bobbye Charleston, MD;  Location: [place];  Service: Gynecology;  Laterality: N/A;  . LAPAROSCOPIC UNILATERAL SALPINGO OOPHERECTOMY Right 12/25/2015   Procedure: LAPAROSCOPIC UNILATERAL SALPINGO OOPHORECTOMY;  Surgeon: Bobbye Charleston, MD;  Location: [place];  Service: Gynecology;  Laterality: Right;  . LAPAROSCOPY N/A 12/25/2015   Procedure: LAPAROSCOPY OPERATIVE;  Surgeon: Bobbye Charleston, MD;  Location: [facility];  Service: Gynecology;  Laterality: N/A;  . LYMPH NODE BIOPSY N/A 04/15/2017   Procedure: SENTINEL LYMPH NODE BIOPSY, PERIAORTIC LYMPH NODES, AND VAGINAL BIOPSY;  Surgeon: Everitt Amber, MD;  Location: [facility];  Service: Gynecology;  Laterality: N/A;  . ROBOTIC ASSISTED TOTAL HYSTERECTOMY WITH BILATERAL SALPINGO OOPHERECTOMY Bilateral 04/15/2017   Procedure: XI ROBOTIC ASSISTED TOTAL LAPROSCOPIC  HYSTERECTOMY WITH LEFT SALPINGO OOPHORECTOMY;  Surgeon: Everitt Amber, MD;  Location: [facility];   Service: Gynecology;  Laterality: Bilateral;  . TONSILLECTOMY  age 46  . TRANSTHORACIC ECHOCARDIOGRAM  07/26/2015   grade 1 diastolic dysfunction , ef 60-65%/  trivial TR    SOCIAL HISTORY: Social History   Socioeconomic History  . Marital status: Divorced    Spouse name: Not on file  . Number of children: 1  . Years of education: Not on file  . Highest education level: Not on file  Occupational History  . 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 Financial resource strain: Not on file  . Food insecurity:    Worry: Not on file    Inability: Not on file  . Transportation needs:    Medical: Not on file    Non-medical: Not on file  Tobacco Use  . Smoking status: Never Smoker  . Smokeless tobacco: Never Used  Substance and Sexual Activity  . Alcohol use: No    Alcohol/week: 0.0 oz  . Drug use: No  . Sexual activity: Not Currently    Birth control/protection: Post-menopausal  Lifestyle  . Physical activity:    Days per week: Not on file    Minutes per session: Not on file  . Stress: Not on file  Relationships  . Social connections:    Talks on phone: Not on file    Gets together: Not on file    Attends religious service: Not on file    Active member of club or organization:  Not on file    Attends meetings of clubs or organizations: Not on file    Relationship status: Not on file  . Intimate partner violence:    Fear of current or ex partner: Not on file    Emotionally abused: Not on file    Physically abused: Not on file    Forced sexual activity: Not on file  Other Topics Concern  . Not on file  Social History Narrative   Divorced.  Son lives with her.  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    FAMILY HISTORY: Family History  Problem Relation Age of Onset  . Alcohol abuse Father        Died age 59  . Heart disease Father   . Aneurysm Mother        Died age 45  . Leukemia Brother        Died age 68  . COPD Sister   . Rheum  arthritis Sister   . Heart failure Sister   . Dementia Maternal Uncle   . Colon cancer Neg Hx     ALLERGIES:  is allergic to armodafinil; invokana [canagliflozin]; and lasix [furosemide].  MEDICATIONS:  Current Outpatient Medications  Medication Sig Dispense Refill  . ALPRAZolam (XANAX) 1 MG tablet TAKE 1/2 TO 1 TABLET BY MOUTH AT BEDTIME AS NEEDED FOR ANXIETY 30 tablet 5  . DULoxetine (CYMBALTA) 30 MG capsule Take 90 mg by mouth every evening.   2  . Ergocalciferol (VITAMIN D2 PO) Take 50,000 Units by mouth every 7 (seven) days. Ergocalciferol Vitalmin D2 50,000 u capsule  SUNDAYS    . ezetimibe (ZETIA) 10 MG tablet Take 10 mg by mouth daily.  12  . gabapentin (NEURONTIN) 300 MG capsule Take 2 capsules (600 mg total) by mouth 3 (three) times daily. Take 600 mg three times a day 120 capsule 0  . GLUCAGON EMERGENCY 1 MG injection Reported on 06/03/2015  2  . hydrocortisone cream 1 % Apply topically 2 (two) times daily. 30 g 0  . losartan-hydrochlorothiazide (HYZAAR) 50-12.5 MG tablet TAKE 1 TABLET BY MOUTH EVERY DAY 90 tablet 0  . morphine (MS CONTIN) 15 MG 12 hr tablet Take 1 tablet (15 mg total) by mouth every 12 (twelve) hours. 60 tablet 0  . morphine (MSIR) 15 MG tablet Take 1 tablet (15 mg total) by mouth every 4 (four) hours as needed for severe pain. 60 tablet 0  . NOVOLOG 100 UNIT/ML injection UNITS BY PUMP DEPENDS ON SUGAR READINGS, SLIDING SCALE    . senna (SENOKOT) 8.6 MG TABS tablet Take 1 tablet (8.6 mg total) by mouth at bedtime. 120 each 0  . SUMAtriptan (IMITREX) 100 MG tablet TAKE 1 TABLET BY MOUTH AS NEEDED FOR MIGRAINES. MAY REPEAT IN 2 HOURS IF NEEDED 30 tablet 5  . traMADol (ULTRAM) 50 MG tablet Take 1 tablet (50 mg total) by mouth every 6 (six) hours as needed. 30 tablet 0  . traZODone (DESYREL) 100 MG tablet Take 1 tablet by mouth at bedtime.  1   No current facility-administered medications for this visit.     REVIEW OF SYSTEMS:   Constitutional: Denies fevers,  chills or abnormal night sweats Eyes: Denies blurriness of vision, double vision or watery eyes Ears, nose, mouth, throat, and face: Denies mucositis or sore throat Respiratory: Denies cough, dyspnea or wheezes Cardiovascular: Denies palpitation, chest discomfort or lower extremity swelling Gastrointestinal:  Denies nausea, heartburn or change in bowel habits Skin: Denies  abnormal skin rashes Lymphatics: Denies new lymphadenopathy or easy bruising Neurological:Denies numbness, tingling or new weaknesses Behavioral/Psych: Mood is stable, no new changes  All other systems were reviewed with the patient and are negative.  PHYSICAL EXAMINATION: ECOG PERFORMANCE STATUS: 1 - Symptomatic but completely ambulatory  Vitals:   05/03/17 1407  BP: 134/84  Pulse: 82  Resp: 18  Temp: 98.4 F (36.9 C)  SpO2: 95%   Filed Weights   05/03/17 1407  Weight: 222 lb 12.8 oz (101.1 kg)    GENERAL:alert, no distress and comfortable SKIN: skin color, texture, turgor are normal, no rashes or significant lesions EYES: normal, conjunctiva are pink and non-injected, sclera clear OROPHARYNX:no exudate, no erythema and lips, buccal mucosa, and tongue normal  NECK: supple, thyroid normal size, non-tender, without nodularity LYMPH:  no palpable lymphadenopathy in the cervical, axillary or inguinal LUNGS: clear to auscultation and percussion with normal breathing effort HEART: regular rate & rhythm and no murmurs and no lower extremity edema ABDOMEN:abdomen soft, non-tender and normal bowel sounds. Well healed surgical scars. No bleeding at drainage sites Musculoskeletal:no cyanosis of digits and no clubbing  PSYCH: alert & oriented x 3 with fluent speech NEURO: no focal motor/sensory deficits  LABORATORY DATA:  I have reviewed the data as listed Lab Results  Component Value Date   WBC 5.6 05/02/2017   HGB 12.8 05/02/2017   HCT 39.7 05/02/2017   MCV 87.3 05/02/2017   PLT 188 05/02/2017   Recent  Labs    10/29/16 1105  04/08/17 1225  04/29/17 1654 05/01/17 1040 05/02/17 0509  NA 141   < > 139   < > 137 142 141  K 4.4   < > 4.2   < > 4.1 4.0 3.9  CL 102   < > 101   < > 100* 103 103  CO2 32   < > 31   < > 28 32 31  GLUCOSE 160*   < > 215*   < > 351* 50* 83  BUN 18   < > 18   < > 15 16 17   CREATININE 0.91   < > 0.73   < > 0.86 0.89 0.77  CALCIUM 8.9   < > 8.9   < > 9.2 8.7* 8.7*  GFRNONAA  --    < > >60   < > >60 >60 >60  GFRAA  --    < > >60   < > >60 >60 >60  PROT 6.2  --  6.4*  --  6.8  --   --   ALBUMIN 3.6  --  3.1*  --  3.3*  --   --   AST 20  --  39  --  65*  --   --   ALT 19  --  45  --  89*  --   --   ALKPHOS 88  --  90  --  95  --   --   BILITOT 0.4  --  0.2*  --  0.9  --   --   BILIDIR 0.1  --   --   --   --   --   --    < > = values in this interval not displayed.    RADIOGRAPHIC STUDIES: I have personally reviewed the radiological images as listed and agreed with the findings in the report. Ct Abdomen Pelvis W Contrast  Result Date: 04/29/2017 CLINICAL DATA:  2 weeks postop from hysterectomy, BSO, and lymph  node dissection for endometrial carcinoma. Severe left pelvic and left leg pain. EXAM: CT ABDOMEN AND PELVIS WITH CONTRAST TECHNIQUE: Multidetector CT imaging of the abdomen and pelvis was performed using the standard protocol following bolus administration of intravenous contrast. CONTRAST:  165mL ISOVUE-300 IOPAMIDOL (ISOVUE-300) INJECTION 61% COMPARISON:  03/26/2017 FINDINGS: Lower Chest: Tiny sub-cm bilateral lower lobe pulmonary nodules are stable. Hepatobiliary: No hepatic masses identified. Gallbladder is unremarkable. Pancreas:  No mass or inflammatory changes. Spleen: Within normal limits in size and appearance. Adrenals/Urinary Tract: No masses identified. No evidence of hydronephrosis. Unremarkable unopacified urinary bladder. Stomach/Bowel: No evidence of obstruction, inflammatory process or abnormal fluid collections. Vascular/Lymphatic: No  pathologically enlarged lymph nodes. New multiloculated fluid collections are seen in the retroperitoneum in the left paraaortic and right pericaval spaces, with extension into the inferior perinephric spaces and common iliac chains bilaterally. Largest collection in the inferior left perinephric space measures 6.1 x 4.8 cm. These are consistent with postoperative lymphoceles. Reproductive: Prior hysterectomy. Adnexal regions are unremarkable in appearance. No fluid collections seen within hysterectomy bed or elsewhere within the pelvis. Other:  None. Musculoskeletal:  No suspicious bone lesions identified. IMPRESSION: Multiloculated bilateral retroperitoneal fluid collections, consistent with postoperative lymphoceles. No evidence of hydronephrosis or other complication. Electronically Signed   By: Earle Gell M.D.   On: 04/29/2017 14:01   Ct Image Guided Drainage By Percutaneous Catheter  Result Date: 04/30/2017 INDICATION: Bilateral abdominal retroperitoneal abscess EXAM: CT GUIDED DRAINAGE OF ABDOMINAL ABSCESS X2 MEDICATIONS: The patient is currently admitted to the hospital and receiving intravenous antibiotics. The antibiotics were administered within an appropriate time frame prior to the initiation of the procedure. ANESTHESIA/SEDATION: Two mg IV Versed 100 mcg IV Fentanyl Moderate Sedation Time:  25 minutes The patient was continuously monitored during the procedure by the interventional radiology nurse under my direct supervision. COMPLICATIONS: None immediate. TECHNIQUE: Informed written consent was obtained from the patient after a thorough discussion of the procedural risks, benefits and alternatives. All questions were addressed. Maximal Sterile Barrier Technique was utilized including caps, mask, sterile gowns, sterile gloves, sterile drape, hand hygiene and skin antiseptic. A timeout was performed prior to the initiation of the procedure. PROCEDURE: The operative field was prepped with back in the  prone position in a sterile fashion, and a sterile drape was applied covering the operative field. A sterile gown and sterile gloves were used for the procedure. Local anesthesia was provided with 1% Lidocaine. Under CT guidance, an 18 gauge needle was inserted into the left retroperitoneal abdominal fluid collection and removed over an Amplatz wire. Ten [place] dilator followed by a 10 [place] drain were inserted. It was looped and string fixed then sewn to the skin. The identical procedure was performed for the right retroperitoneal abdominal fluid collection. Cloudy yellow fluid was aspirated from each cavity and sent for culture. FINDINGS: Imaging demonstrates placement of bilateral 10 French abdominal abscess drains. IMPRESSION: Successful abdominal abscess drain x2. Electronically Signed   By: Marybelle Killings M.D.   On: 04/30/2017 17:58   Ct Image Guided Drainage By Percutaneous Catheter  Result Date: 04/30/2017 INDICATION: Bilateral abdominal retroperitoneal abscess EXAM: CT GUIDED DRAINAGE OF ABDOMINAL ABSCESS X2 MEDICATIONS: The patient is currently admitted to the hospital and receiving intravenous antibiotics. The antibiotics were administered within an appropriate time frame prior to the initiation of the procedure. ANESTHESIA/SEDATION: Two mg IV Versed 100 mcg IV Fentanyl Moderate Sedation Time:  25 minutes The patient was continuously monitored during the procedure by the interventional radiology nurse under  my direct supervision. COMPLICATIONS: None immediate. TECHNIQUE: Informed written consent was obtained from the patient after a thorough discussion of the procedural risks, benefits and alternatives. All questions were addressed. Maximal Sterile Barrier Technique was utilized including caps, mask, sterile gowns, sterile gloves, sterile drape, hand hygiene and skin antiseptic. A timeout was performed prior to the initiation of the procedure. PROCEDURE: The operative field was prepped with back in the  prone position in a sterile fashion, and a sterile drape was applied covering the operative field. A sterile gown and sterile gloves were used for the procedure. Local anesthesia was provided with 1% Lidocaine. Under CT guidance, an 18 gauge needle was inserted into the left retroperitoneal abdominal fluid collection and removed over an Amplatz wire. Ten [place] dilator followed by a 10 [place] drain were inserted. It was looped and string fixed then sewn to the skin. The identical procedure was performed for the right retroperitoneal abdominal fluid collection. Cloudy yellow fluid was aspirated from each cavity and sent for culture. FINDINGS: Imaging demonstrates placement of bilateral 10 French abdominal abscess drains. IMPRESSION: Successful abdominal abscess drain x2. Electronically Signed   By: Marybelle Killings M.D.   On: 04/30/2017 17:58    I spent 40 minutes counseling the patient face to face. The total time spent in the appointment was 60 minutes and more than 50% was on counseling.  All questions were answered. The patient knows to call the clinic with any problems, questions or concerns.  Heath Lark, MD 05/03/2017 4:30 PM

## 2017-05-04 ENCOUNTER — Other Ambulatory Visit: Payer: Self-pay | Admitting: Family Medicine

## 2017-05-04 ENCOUNTER — Telehealth: Payer: Self-pay | Admitting: *Deleted

## 2017-05-04 NOTE — Telephone Encounter (Signed)
CALLED PATIENT TO RESCHEDULE [place] APPT. ON 05-05-17 DUE TO PATIENT NOT NEEDING TO COME UNTIL LATER, RESCHEDULED FOR 05-19-17, PATIENT AGREED TO NEW TIME AND DATE.

## 2017-05-05 ENCOUNTER — Ambulatory Visit
Admission: RE | Admit: 2017-05-05 | Discharge: 2017-05-05 | Disposition: A | Discharge status: Home / Self Care | Payer: Medicare Other | Source: Ambulatory Visit | Attending: Radiation Oncology | Admitting: Radiation Oncology

## 2017-05-05 ENCOUNTER — Ambulatory Visit: Payer: Medicare Other

## 2017-05-05 ENCOUNTER — Ambulatory Visit: Payer: Medicare Other | Admitting: Gynecologic Oncology

## 2017-05-05 LAB — AEROBIC/ANAEROBIC CULTURE W GRAM STAIN (SURGICAL/DEEP WOUND): Culture: NO GROWTH

## 2017-05-05 LAB — AEROBIC/ANAEROBIC CULTURE (SURGICAL/DEEP WOUND): CULTURE: NO GROWTH

## 2017-05-05 NOTE — Telephone Encounter (Signed)
Last OV 10/29/2016   Last refilled 04/16/2017 disp 30 with no refills   Sent to PCP for approval

## 2017-05-05 NOTE — Telephone Encounter (Signed)
Call in #[phone] with one rf 

## 2017-05-06 ENCOUNTER — Other Ambulatory Visit: Payer: Self-pay | Admitting: Radiology

## 2017-05-06 ENCOUNTER — Ambulatory Visit
Admission: RE | Admit: 2017-05-06 | Discharge: 2017-05-06 | Disposition: A | Discharge status: Home / Self Care | Payer: Medicare Other | Source: Ambulatory Visit | Attending: Radiology | Admitting: Radiology

## 2017-05-06 ENCOUNTER — Other Ambulatory Visit: Payer: Self-pay | Admitting: Hematology and Oncology

## 2017-05-06 ENCOUNTER — Telehealth (HOSPITAL_COMMUNITY): Payer: Self-pay | Admitting: Radiology

## 2017-05-06 ENCOUNTER — Ambulatory Visit
Admission: RE | Admit: 2017-05-06 | Discharge: 2017-05-06 | Disposition: A | Discharge status: Home / Self Care | Payer: Medicare Other | Source: Ambulatory Visit | Attending: Hematology and Oncology | Admitting: Hematology and Oncology

## 2017-05-06 DIAGNOSIS — Z9889 Other specified postprocedural states: Secondary | ICD-10-CM

## 2017-05-06 DIAGNOSIS — R188 Other ascites: Secondary | ICD-10-CM

## 2017-05-06 HISTORY — PX: IR RADIOLOGIST EVAL & MGMT: IMG5224

## 2017-05-06 MED ORDER — IOPAMIDOL (ISOVUE-300) INJECTION 61%
125.0000 mL | Freq: Once | INTRAVENOUS | Status: AC | PRN
  Administered 2017-05-06: 125 mL via INTRAVENOUS

## 2017-05-06 NOTE — Telephone Encounter (Signed)
spoke with Katie Palmer about coming tomorrow 05/07/17 to Short Stay at 12:30pm to be worked up for her 2:00 pm procedure in [place].Instructed Katie Palmer to be NPO after midnight and have driver to taker her home.

## 2017-05-06 NOTE — Progress Notes (Signed)
Patient ID: Katie Palmer, female   DOB: [birth year], 67 y.o.   MRN: [id number]         Chief Complaint: Post-op retroperitoneal seromas, post bilateral drainage catheter placement  Referring Physician(s): Alvy Bimler (Onc) Cross, [name] (Gyn Onc)  History of Present Illness: Katie Palmer is a 67 y.o. female with past mental history significant for type 1 diabetes and recent diagnosis of endometrial cancer who is status post hysterectomy, BSO and retroperitoneal lymph node dissection who presented to the emergency department on 04/29/2017 with abdominal pain and found to have bilateral retroperitoneal fluid collections, presumed seromas.  For this, the patient underwent bilateral percutaneous drainage catheter placement on 04/30/2017 by Dr. Barbie Banner.  Patient presents today to the interventional radiology drain clinic for drainage catheter evaluation and management.  She is unaccompanied and serves as her own historian.  Patient states that she has had a high volume output from the left retroperitoneal drainage catheter of 125-150 cc per day until the past 2-3 days.    Patient states there is been minimal to no output from the right-sided percutaneous drainage catheter, with only a scant amount of bloody output.  Patient states that her abdominal pain and additional presenting symptoms are relatively unchanged.  She denies fever or chills.    Past Medical History:  Diagnosis Date  . Depression   . Fibromyalgia   . Headache(784.0)   . History of colon polyps   . History of diabetes with ketoacidosis   . Hx of colonic polyps   . Hypertension   . Insomnia   . Insulin pump in place   . RBBB (right bundle branch block)   . RLQ abdominal pain   . Thickened endometrium   . Type 1 diabetes mellitus with long-term current use of insulin [facility]) endocrinologist-- dr Chalmers Cater   July 2015--  pt took Invokana and caused complete pancreas shut-down , pt went from pre-diabetic to adult type 1 dm now insulin  dependent  . Vertigo     Past Surgical History:  Procedure Laterality Date  . CARDIOVASCULAR STRESS TEST  07/26/2015   normal myocardial perfusion study w/ no ischemia/  normal LV function and wall motion , ef 71%  . COLONOSCOPY WITH PROPOFOL  last one 10/ 2017  . HYSTEROSCOPY W/D&C N/A 12/25/2015   Procedure: DILATATION AND CURETTAGE /HYSTEROSCOPY;  Surgeon: Bobbye Charleston, MD;  Location: [facility];  Service: Gynecology;  Laterality: N/A;  . IR RADIOLOGIST EVAL & MGMT  05/06/2017  . LAPAROSCOPIC LYSIS OF ADHESIONS N/A 12/25/2015   Procedure: LAPAROSCOPIC LYSIS OF ADHESIONS;  Surgeon: Bobbye Charleston, MD;  Location: [place];  Service: Gynecology;  Laterality: N/A;  . LAPAROSCOPIC UNILATERAL SALPINGO OOPHERECTOMY Right 12/25/2015   Procedure: LAPAROSCOPIC UNILATERAL SALPINGO OOPHORECTOMY;  Surgeon: Bobbye Charleston, MD;  Location: [place];  Service: Gynecology;  Laterality: Right;  . LAPAROSCOPY N/A 12/25/2015   Procedure: LAPAROSCOPY OPERATIVE;  Surgeon: Bobbye Charleston, MD;  Location: [facility];  Service: Gynecology;  Laterality: N/A;  . LYMPH NODE BIOPSY N/A 04/15/2017   Procedure: SENTINEL LYMPH NODE BIOPSY, PERIAORTIC LYMPH NODES, AND VAGINAL BIOPSY;  Surgeon: Everitt Amber, MD;  Location: [facility];  Service: Gynecology;  Laterality: N/A;  . ROBOTIC ASSISTED TOTAL HYSTERECTOMY WITH BILATERAL SALPINGO OOPHERECTOMY Bilateral 04/15/2017   Procedure: XI ROBOTIC ASSISTED TOTAL LAPROSCOPIC  HYSTERECTOMY WITH LEFT SALPINGO OOPHORECTOMY;  Surgeon: Everitt Amber, MD;  Location: [facility];  Service: Gynecology;  Laterality: Bilateral;  . TONSILLECTOMY  age 46  .  TRANSTHORACIC ECHOCARDIOGRAM  07/26/2015   grade 1 diastolic dysfunction , ef 60-65%/  trivial TR    Allergies: Armodafinil; Invokana [canagliflozin]; and Lasix [furosemide]  Medications: Prior to Admission medications   Medication Sig Start Date End Date Taking?  Authorizing Provider  ALPRAZolam Duanne Moron) 1 MG tablet TAKE 1/2 TO 1 TABLET BY MOUTH AT BEDTIME AS NEEDED FOR ANXIETY 05/05/16   Laurey Morale, MD  DULoxetine (CYMBALTA) 30 MG capsule Take 90 mg by mouth every evening.  04/17/15   [provider]  Ergocalciferol (VITAMIN D2 PO) Take 50,000 Units by mouth every 7 (seven) days. Ergocalciferol Vitalmin D2 50,000 u capsule  SUNDAYS    [provider]  ezetimibe (ZETIA) 10 MG tablet Take 10 mg by mouth daily. 09/16/16   [provider]  gabapentin (NEURONTIN) 300 MG capsule Take 2 capsules (600 mg total) by mouth 3 (three) times daily. Take 600 mg three times a day 05/02/17   Everitt Amber, MD  GLUCAGON EMERGENCY 1 MG injection Reported on 06/03/2015 03/08/14   [provider]  hydrocortisone cream 1 % Apply topically 2 (two) times daily. 05/02/17   Everitt Amber, MD  losartan-hydrochlorothiazide [facility]) 50-12.5 MG tablet TAKE 1 TABLET BY MOUTH EVERY DAY 04/12/17   Laurey Morale, MD  morphine (MS CONTIN) 15 MG 12 hr tablet Take 1 tablet (15 mg total) by mouth every 12 (twelve) hours. 05/02/17 06/01/17  Everitt Amber, MD  morphine (MSIR) 15 MG tablet Take 1 tablet (15 mg total) by mouth every 4 (four) hours as needed for severe pain. 05/02/17 06/01/17  Everitt Amber, MD  NOVOLOG 100 UNIT/ML injection UNITS BY PUMP DEPENDS ON SUGAR READINGS, SLIDING SCALE 05/24/15   [provider]  senna (SENOKOT) 8.6 MG TABS tablet Take 1 tablet (8.6 mg total) by mouth at bedtime. 05/02/17   Everitt Amber, MD  SUMAtriptan (IMITREX) 100 MG tablet TAKE 1 TABLET BY MOUTH AS NEEDED FOR MIGRAINES. MAY REPEAT IN 2 HOURS IF NEEDED 02/05/17   Laurey Morale, MD  traMADol (ULTRAM) 50 MG tablet TAKE 2 TABLETS BY MOUTH EVERY 4 HOURS AS NEEDED 05/06/17   Laurey Morale, MD  traZODone (DESYREL) 100 MG tablet Take 1 tablet by mouth at bedtime. 04/15/15   [provider]     Family History  Problem Relation Age of Onset  . Alcohol abuse Father        Died age 71   . Heart disease Father   . Aneurysm Mother        Died age 38  . Leukemia Brother        Died age 69  . COPD Sister   . Rheum arthritis Sister   . Heart failure Sister   . Dementia Maternal Uncle   . Colon cancer Neg Hx     Social History   Socioeconomic History  . Marital status: Divorced    Spouse name: Not on file  . Number of children: 1  . Years of education: Not on file  . Highest education level: Not on file  Occupational History  . 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 Financial resource strain: Not on file  . Food insecurity:    Worry: Not on file    Inability: Not on file  . Transportation needs:    Medical: Not on file    Non-medical: Not on file  Tobacco Use  . Smoking status: Never Smoker  . Smokeless tobacco: Never Used  Substance and Sexual  Activity  . Alcohol use: No    Alcohol/week: 0.0 oz  . Drug use: No  . Sexual activity: Not Currently    Birth control/protection: Post-menopausal  Lifestyle  . Physical activity:    Days per week: Not on file    Minutes per session: Not on file  . Stress: Not on file  Relationships  . Social connections:    Talks on phone: Not on file    Gets together: Not on file    Attends religious service: Not on file    Active member of club or organization: Not on file    Attends meetings of clubs or organizations: Not on file    Relationship status: Not on file  Other Topics Concern  . Not on file  Social History Narrative   Divorced.  Son lives with her.  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    ECOG Status: 1 - Symptomatic but completely ambulatory  Review of Systems: A 12 point ROS discussed and pertinent positives are indicated in the HPI above.  All other systems are negative.  Review of Systems  Constitutional: Positive for fatigue. Negative for chills and fever.  Gastrointestinal: Positive for abdominal pain.  Genitourinary: Positive for flank pain.       Flank  pain associated with the entrance site of the right-sided percutaneous drainage catheter.    Vital Signs: BP 131/74 (BP Location: Right Arm, Cuff Size: Normal)   Pulse 87   Temp (!) 97.2 F (36.2 C)   Resp 14   SpO2 97%   Physical Exam  Musculoskeletal:       Arms: Location of the patient's bilateral retroperitoneal drainage catheters.     Imaging: Ct Abdomen Pelvis W Contrast  Result Date: 05/06/2017 CLINICAL DATA:  History of hysterectomy and BSO and lymph node dissection for endometrial carcinoma who presented to the emergency department on 04/29/2017 with abdominal pain and subsequent CT scan demonstrating development of bilateral retroperitoneal fluid collections concerning for postoperative seromas and/or abscesses. As such, patient underwent bilateral CT-guided percutaneous drainage catheter placement on 04/30/2017 by Dr. Barbie Banner. Patient presents today to the interventional radiology drain Clinic for drainage catheter evaluation and management. EXAM: CT ABDOMEN AND PELVIS WITH CONTRAST TECHNIQUE: Multidetector CT imaging of the abdomen and pelvis was performed using the standard protocol following bolus administration of intravenous contrast. CONTRAST:  191mL ISOVUE-300 IOPAMIDOL (ISOVUE-300) INJECTION 61% COMPARISON:  CT abdomen pelvis-04/29/2017; 03/26/2017; CT-guided bilateral retroperitoneal drainage catheter placement-04/30/2017 FINDINGS: Lower chest: Limited visualization of the lower thorax demonstrates unchanged punctate (approximately 0.4 cm) nodules within the right lower (image 4, series 4 and left lower (image 11, series 4) lobes. Minimal subsegmental atelectasis within the medial basilar aspect of the left lower lobe. No focal airspace opacities. No pleural effusion. Normal heart size.  No pericardial effusion. Hepatobiliary: Normal hepatic contour. There is a minimal amount of focal fatty infiltration adjacent to the fissure for ligamentum teres. No discrete hepatic lesions.  Normal appearance of the gallbladder given degree distention. No radiopaque gallstones. Grossly unchanged very mild centralized intrahepatic biliary duct dilatation. No extrahepatic bili duct dilatation. No ascites. Pancreas: The pancreas remains slightly atrophic. Spleen: Normal appearance of the spleen. Adrenals/Urinary Tract: There is symmetric enhancement of the bilateral kidneys. No renal stones in this postcontrast examination. No discrete renal lesions. No urinary obstruction or perinephric stranding. Normal appearance the bilateral adrenal glands. Normal appearance of the urinary bladder given underdistention. Stomach/Bowel: Moderate colonic stool burden without evidence of enteric  obstruction. The cecum is noted to be located within the right mid abdomen. Normal appearance of the terminal ileum. The appendix is not visualized, however there is no pericecal inflammatory change. No pneumoperitoneum, pneumatosis or portal venous gas. Vascular/Lymphatic: Normal caliber the abdominal aorta. The major branch vessels of the abdominal aorta appear patent on this non CTA examination. No bulky retroperitoneal, mesenteric, pelvic or inguinal lymphadenopathy. Reproductive: Post hysterectomy and bilateral oophorectomy. No free fluid the pelvic cul-de-sac. Other: Right-sided retroperitoneal percutaneous drainage catheter has been retracted is currently located outside the residual right retroperitoneal fluid collection the right-sided retroperitoneal fluid collection is grossly unchanged in size measuring approximately 10.5 x 4.8 x 9.7 cm (axial image 43, series 2, coronal image 77, series 6, previously, 9.3 x 3.5 x 9.6 cm. The right-sided retroperitoneal drainage catheter is located within the peripheral aspect of the left retroperitoneal fluid collection and the left-sided fluid collection is also grossly unchanged in size measuring approximately 9.6 x 5.4 x 13.1 cm (axial image 38, series 2, coronal image 81, series 6  previously, 9.5 x 4.8 x 14.1 cm. Musculoskeletal: No acute or aggressive osseous abnormalities. Stigmata of DISH within thoracic spine. Grade 1 anterolisthesis of L4 upon L5 measuring approximately 5 mm without associated pars defects, unchanged. Bilateral degenerative change of the lower lumbar spine. IMPRESSION: 1. Interval retraction of right-sided percutaneous drainage catheter with end now coiled and locked peripheral the grossly unchanged right-sided retroperitoneal fluid collection, presumed postoperative seroma. 2. Unchanged positioning of left-sided percutaneous drainage catheter with end coiled and locked within the peripheral aspect of the grossly unchanged left-sided retroperitoneal fluid collection, presumed postoperative seroma. PLAN: - The malpositioned right-sided retroperitoneal drainage catheter was removed at the patient's bedside without incident. - Above discussed with both Dr. Alvy Bimler and Katie Palmer and the decision was made to proceed with CT-guided replacement of the right-sided retroperitoneal percutaneous drainage catheter and exchange/up sizing of the left-sided retroperitoneal drainage catheter. This will be performed at an outpatient basis tomorrow [place] Dr. Earleen Newport. Electronically Signed   By: Sandi Mariscal M.D.   On: 05/06/2017 15:25   Ct Abdomen Pelvis W Contrast  Result Date: 04/29/2017 CLINICAL DATA:  2 weeks postop from hysterectomy, BSO, and lymph node dissection for endometrial carcinoma. Severe left pelvic and left leg pain. EXAM: CT ABDOMEN AND PELVIS WITH CONTRAST TECHNIQUE: Multidetector CT imaging of the abdomen and pelvis was performed using the standard protocol following bolus administration of intravenous contrast. CONTRAST:  175mL ISOVUE-300 IOPAMIDOL (ISOVUE-300) INJECTION 61% COMPARISON:  03/26/2017 FINDINGS: Lower Chest: Tiny sub-cm bilateral lower lobe pulmonary nodules are stable. Hepatobiliary: No hepatic masses identified. Gallbladder is  unremarkable. Pancreas:  No mass or inflammatory changes. Spleen: Within normal limits in size and appearance. Adrenals/Urinary Tract: No masses identified. No evidence of hydronephrosis. Unremarkable unopacified urinary bladder. Stomach/Bowel: No evidence of obstruction, inflammatory process or abnormal fluid collections. Vascular/Lymphatic: No pathologically enlarged lymph nodes. New multiloculated fluid collections are seen in the retroperitoneum in the left paraaortic and right pericaval spaces, with extension into the inferior perinephric spaces and common iliac chains bilaterally. Largest collection in the inferior left perinephric space measures 6.1 x 4.8 cm. These are consistent with postoperative lymphoceles. Reproductive: Prior hysterectomy. Adnexal regions are unremarkable in appearance. No fluid collections seen within hysterectomy bed or elsewhere within the pelvis. Other:  None. Musculoskeletal:  No suspicious bone lesions identified. IMPRESSION: Multiloculated bilateral retroperitoneal fluid collections, consistent with postoperative lymphoceles. No evidence of hydronephrosis or other complication. Electronically Signed   By: Jenny Reichmann  Kris Hartmann M.D.   On: 04/29/2017 14:01   Ct Image Guided Drainage By Percutaneous Catheter  Result Date: 04/30/2017 INDICATION: Bilateral abdominal retroperitoneal abscess EXAM: CT GUIDED DRAINAGE OF ABDOMINAL ABSCESS X2 MEDICATIONS: The patient is currently admitted to the hospital and receiving intravenous antibiotics. The antibiotics were administered within an appropriate time frame prior to the initiation of the procedure. ANESTHESIA/SEDATION: Two mg IV Versed 100 mcg IV Fentanyl Moderate Sedation Time:  25 minutes The patient was continuously monitored during the procedure by the interventional radiology nurse under my direct supervision. COMPLICATIONS: None immediate. TECHNIQUE: Informed written consent was obtained from the patient after a thorough discussion of the  procedural risks, benefits and alternatives. All questions were addressed. Maximal Sterile Barrier Technique was utilized including caps, mask, sterile gowns, sterile gloves, sterile drape, hand hygiene and skin antiseptic. A timeout was performed prior to the initiation of the procedure. PROCEDURE: The operative field was prepped with back in the prone position in a sterile fashion, and a sterile drape was applied covering the operative field. A sterile gown and sterile gloves were used for the procedure. Local anesthesia was provided with 1% Lidocaine. Under CT guidance, an 18 gauge needle was inserted into the left retroperitoneal abdominal fluid collection and removed over an Amplatz wire. Ten [place] dilator followed by a 10 [place] drain were inserted. It was looped and string fixed then sewn to the skin. The identical procedure was performed for the right retroperitoneal abdominal fluid collection. Cloudy yellow fluid was aspirated from each cavity and sent for culture. FINDINGS: Imaging demonstrates placement of bilateral 10 French abdominal abscess drains. IMPRESSION: Successful abdominal abscess drain x2. Electronically Signed   By: Marybelle Killings M.D.   On: 04/30/2017 17:58   Ct Image Guided Drainage By Percutaneous Catheter  Result Date: 04/30/2017 INDICATION: Bilateral abdominal retroperitoneal abscess EXAM: CT GUIDED DRAINAGE OF ABDOMINAL ABSCESS X2 MEDICATIONS: The patient is currently admitted to the hospital and receiving intravenous antibiotics. The antibiotics were administered within an appropriate time frame prior to the initiation of the procedure. ANESTHESIA/SEDATION: Two mg IV Versed 100 mcg IV Fentanyl Moderate Sedation Time:  25 minutes The patient was continuously monitored during the procedure by the interventional radiology nurse under my direct supervision. COMPLICATIONS: None immediate. TECHNIQUE: Informed written consent was obtained from the patient after a thorough discussion of the  procedural risks, benefits and alternatives. All questions were addressed. Maximal Sterile Barrier Technique was utilized including caps, mask, sterile gowns, sterile gloves, sterile drape, hand hygiene and skin antiseptic. A timeout was performed prior to the initiation of the procedure. PROCEDURE: The operative field was prepped with back in the prone position in a sterile fashion, and a sterile drape was applied covering the operative field. A sterile gown and sterile gloves were used for the procedure. Local anesthesia was provided with 1% Lidocaine. Under CT guidance, an 18 gauge needle was inserted into the left retroperitoneal abdominal fluid collection and removed over an Amplatz wire. Ten [place] dilator followed by a 10 [place] drain were inserted. It was looped and string fixed then sewn to the skin. The identical procedure was performed for the right retroperitoneal abdominal fluid collection. Cloudy yellow fluid was aspirated from each cavity and sent for culture. FINDINGS: Imaging demonstrates placement of bilateral 10 French abdominal abscess drains. IMPRESSION: Successful abdominal abscess drain x2. Electronically Signed   By: Marybelle Killings M.D.   On: 04/30/2017 17:58   Ir Radiologist Eval & Mgmt  Result Date: 05/06/2017 Please refer to notes  tab for details about interventional procedure. (Op Note)   Labs:  CBC: Recent Labs    04/16/17 0411 04/29/17 1654 05/01/17 1040 05/02/17 0509  WBC 6.3 8.3 5.8 5.6  HGB 11.0* 13.5 12.0 12.8  HCT 34.6* 41.4 37.4 39.7  PLT 207 248 187 188    COAGS: Recent Labs    04/29/17 1654  INR 0.94    BMP: Recent Labs    04/16/17 0411 04/29/17 1654 05/01/17 1040 05/02/17 0509  NA 141 137 142 141  K 4.2 4.1 4.0 3.9  CL 105 100* 103 103  CO2 30 28 32 31  GLUCOSE 136* 351* 50* 83  BUN 14 15 16 17   CALCIUM 8.4* 9.2 8.7* 8.7*  CREATININE 0.82 0.86 0.89 0.77  GFRNONAA >60 >60 >60 >60  GFRAA >60 >60 >60 >60    LIVER FUNCTION  TESTS: Recent Labs    10/29/16 1105 04/08/17 1225 04/29/17 1654  BILITOT 0.4 0.2* 0.9  AST 20 39 65*  ALT 19 45 89*  ALKPHOS 88 90 95  PROT 6.2 6.4* 6.8  ALBUMIN 3.6 3.1* 3.3*    TUMOR MARKERS: No results for input(s): AFPTM, CEA, CA199, CHROMGRNA in the last 8760 hours.  Assessment and Plan:  Jhaniya Briski is a 67 y.o. female with past mental history significant for type 1 diabetes and recent diagnosis of endometrial cancer who is status post hysterectomy, BSO and retroperitoneal lymph node dissection who presented to the emergency department on 04/29/2017 with abdominal pain and found to have bilateral retroperitoneal fluid collections, presumed seromas.  For this, the patient underwent bilateral percutaneous drainage catheter placement on 04/30/2017 by Dr. Barbie Banner.   Review of CT scan performed earlier today demonstrates the following:  Unfortunately, the right-sided percutaneous drainage catheter is malpositioned with end coiled and locked external to the grossly unchanged residual large right-sided retroperitoneal fluid collection again measuring approximately 9.7 cm in maximal diameter.  The left-sided retroperitoneal drainage catheter has been retracted though the end remains within the peripheral aspects of the left-sided retroperitoneal fluid collection.  Unfortunately, the left-sided retroperitoneal fluid collection is also grossly unchanged since the presenting CT, again measuring approximately 13.1 cm in maximal diameter.  As patient is experiencing discomfort with the malposition right-sided retroperitoneal drainage catheter, the right sided RP drainage catheter was removed at the patient's bedside without incident.  PLAN: - Patient will be scheduled for right-sided percutaneous drainage catheter placement and left-sided percutaneous drainage catheter exchange/repositioning potential upsizing.  This procedure will be performed at [facility] tomorrow afternoon on an  out-pt basis by my partner, Dr. Jacqualyn Posey, who has reviewed the images and agreed to perform the procedure.  The Patient has been instructed to be n.p.o. after midnight and have a driver as his procedure will requires conscious sedation.  The patient is not on anticoagulation.  - The patient will return to the interventional radiology drain clinic in 2 weeks with postprocedural CT scan.  This CT scan will also include delayed renal excretory phase imaging of both the abdomen and pelvis to ensure there is been no injury to either ureter or collecting system.  The plan of care was discussed and agreed upon with Dr. Alvy Bimler, Katie Palmer and the patient.   A copy of this report was sent to the requesting provider on this date.  Electronically Signed: Sandi Mariscal 05/06/2017, 3:57 PM   I spent a total of 15 Minutes in face to face in clinical consultation, greater than 50% of which was counseling/coordinating care for  percutaneous drainage catheter evaluation and management

## 2017-05-07 ENCOUNTER — Encounter (HOSPITAL_COMMUNITY): Payer: Self-pay

## 2017-05-07 ENCOUNTER — Ambulatory Visit (HOSPITAL_COMMUNITY)
Admission: RE | Admit: 2017-05-07 | Discharge: 2017-05-07 | Disposition: A | Discharge status: Home / Self Care | Payer: Medicare Other | Source: Ambulatory Visit | Attending: Hematology and Oncology | Admitting: Hematology and Oncology

## 2017-05-07 ENCOUNTER — Other Ambulatory Visit: Payer: Self-pay | Admitting: Radiology

## 2017-05-07 ENCOUNTER — Ambulatory Visit (HOSPITAL_COMMUNITY): Payer: Medicare Other

## 2017-05-07 DIAGNOSIS — F329 Major depressive disorder, single episode, unspecified: Secondary | ICD-10-CM | POA: Diagnosis not present

## 2017-05-07 DIAGNOSIS — G4700 Insomnia, unspecified: Secondary | ICD-10-CM | POA: Diagnosis not present

## 2017-05-07 DIAGNOSIS — N99843 Postprocedural seroma of a genitourinary system organ or structure following other procedure: Secondary | ICD-10-CM | POA: Diagnosis present

## 2017-05-07 DIAGNOSIS — Z90710 Acquired absence of both cervix and uterus: Secondary | ICD-10-CM | POA: Diagnosis not present

## 2017-05-07 DIAGNOSIS — Y763 Surgical instruments, materials and obstetric and gynecological devices (including sutures) associated with adverse incidents: Secondary | ICD-10-CM | POA: Diagnosis not present

## 2017-05-07 DIAGNOSIS — C541 Malignant neoplasm of endometrium: Secondary | ICD-10-CM | POA: Insufficient documentation

## 2017-05-07 DIAGNOSIS — T859XXA Unspecified complication of internal prosthetic device, implant and graft, initial encounter: Secondary | ICD-10-CM | POA: Insufficient documentation

## 2017-05-07 DIAGNOSIS — M797 Fibromyalgia: Secondary | ICD-10-CM | POA: Diagnosis not present

## 2017-05-07 DIAGNOSIS — I4510 Unspecified right bundle-branch block: Secondary | ICD-10-CM | POA: Insufficient documentation

## 2017-05-07 DIAGNOSIS — Y832 Surgical operation with anastomosis, bypass or graft as the cause of abnormal reaction of the patient, or of later complication, without mention of misadventure at the time of the procedure: Secondary | ICD-10-CM | POA: Diagnosis not present

## 2017-05-07 DIAGNOSIS — Y836 Removal of other organ (partial) (total) as the cause of abnormal reaction of the patient, or of later complication, without mention of misadventure at the time of the procedure: Secondary | ICD-10-CM | POA: Insufficient documentation

## 2017-05-07 DIAGNOSIS — Z9889 Other specified postprocedural states: Secondary | ICD-10-CM

## 2017-05-07 DIAGNOSIS — I10 Essential (primary) hypertension: Secondary | ICD-10-CM | POA: Insufficient documentation

## 2017-05-07 DIAGNOSIS — E109 Type 1 diabetes mellitus without complications: Secondary | ICD-10-CM | POA: Insufficient documentation

## 2017-05-07 LAB — CBC
HCT: 41.2 % (ref 36.0–46.0)
Hemoglobin: 13.6 g/dL (ref 12.0–15.0)
MCH: 28.5 pg (ref 26.0–34.0)
MCHC: 33 g/dL (ref 30.0–36.0)
MCV: 86.2 fL (ref 78.0–100.0)
PLATELETS: 203 10*3/uL (ref 150–400)
RBC: 4.78 MIL/uL (ref 3.87–5.11)
RDW: 14.5 % (ref 11.5–15.5)
WBC: 10.6 10*3/uL — AB (ref 4.0–10.5)

## 2017-05-07 LAB — PROTIME-INR
INR: 0.95
PROTHROMBIN TIME: 12.6 s (ref 11.4–15.2)

## 2017-05-07 MED ORDER — SODIUM CHLORIDE 0.9% FLUSH: 5.0000 mL | Freq: Three times a day (TID) | INTRAVENOUS | Status: DC

## 2017-05-07 MED ORDER — LIDOCAINE HCL 1 % IJ SOLN
INTRAMUSCULAR | Status: AC
  Filled 2017-05-07: qty 20

## 2017-05-07 MED ORDER — FENTANYL CITRATE (PF) 100 MCG/2ML IJ SOLN
INTRAMUSCULAR | Status: AC | PRN
  Administered 2017-05-07: 50 ug via INTRAVENOUS
  Administered 2017-05-07 (×2): 25 ug via INTRAVENOUS

## 2017-05-07 MED ORDER — SODIUM CHLORIDE 0.9 % IV SOLN
INTRAVENOUS | Status: DC
Start: 2017-05-07 — End: 2017-05-08
  Administered 2017-05-07: 13:00:00 via INTRAVENOUS

## 2017-05-07 MED ORDER — MIDAZOLAM HCL 2 MG/2ML IJ SOLN
INTRAMUSCULAR | Status: AC | PRN
  Administered 2017-05-07: 0.5 mg via INTRAVENOUS
  Administered 2017-05-07: 1 mg via INTRAVENOUS
  Administered 2017-05-07: 0.5 mg via INTRAVENOUS

## 2017-05-07 MED ORDER — MIDAZOLAM HCL 2 MG/2ML IJ SOLN
INTRAMUSCULAR | Status: AC
  Filled 2017-05-07: qty 4

## 2017-05-07 MED ORDER — FENTANYL CITRATE (PF) 100 MCG/2ML IJ SOLN
INTRAMUSCULAR | Status: AC
  Filled 2017-05-07: qty 4

## 2017-05-07 NOTE — Sedation Documentation (Signed)
Patient is resting comfortably. 

## 2017-05-07 NOTE — Discharge Instructions (Signed)
Surgical Drain Home [facility] °Surgical drains are used to remove extra fluid that normally builds up in a surgical wound after surgery. A surgical drain helps to heal a surgical wound. Different kinds of surgical drains include: °· Active drains. These drains use suction to pull drainage away from the surgical wound. Drainage flows through a tube to a container outside of the body. It is important to keep the bulb or the drainage container flat (compressed) at all times, except while you empty it. Flattening the bulb or container creates suction. The two most common types of active drains are bulb drains and Hemovac drains. °· Passive drains. These drains allow fluid to drain naturally, by gravity. Drainage flows through a tube to a bandage (dressing) or a container outside of the body. Passive drains do not need to be emptied. The most common type of passive drain is the Penrose drain. ° °A drain is placed during surgery. Immediately after surgery, drainage is usually bright red and a little thicker than water. The drainage may gradually turn yellow or pink and become thinner. It is likely that your health care provider will remove the drain when the drainage stops or when the amount decreases to 1-2 Tbsp (15-30 mL) during a 24-hour period. °How to care for your surgical drain °· Keep the skin around the drain dry and covered with a dressing at all times. °· Check your drain area every day for signs of infection. Check for: °? More redness, swelling, or pain. °? Pus or a bad smell. °? Cloudy drainage. °Follow instructions from your health care provider about how to take care of your drain and how to change your dressing. Change your dressing at least one time every day. Change it more often if needed to keep the dressing dry. Make sure you: °1. Gather your supplies, including: °? Tape. °? Germ-free cleaning solution (sterile saline). °? Split gauze drain sponge: 4 x 4 inches (10 x 10 cm). °? Gauze square: 4 x 4 inches  (10 x 10 cm). °2. Wash your hands with soap and water before you change your dressing. If soap and water are not available, use hand sanitizer. °3. Remove the old dressing. Avoid using scissors to do that. °4. Use sterile saline to clean your skin around the drain. °5. Place the tube through the slit in a drain sponge. Place the drain sponge so that it covers your wound. °6. Place the gauze square or another drain sponge on top of the drain sponge that is on the wound. Make sure the tube is between those layers. °7. Tape the dressing to your skin. °8. If you have an active bulb or Hemovac drain, tape the drainage tube to your skin 1-2 inches (2.5-5 cm) below the place where the tube enters your body. Taping keeps the tube from pulling on any stitches (sutures) that you have. °9. Wash your hands with soap and water. °10. Write down the color of your drainage and how often you change your dressing. ° °How to empty your active bulb or Hemovac drain °1. Make sure that you have a measuring cup that you can empty your drainage into. °2. Wash your hands with soap and water. If soap and water are not available, use hand sanitizer. °3. Gently move your fingers down the tube while squeezing very lightly. This is called stripping the tube. This clears any drainage, clots, or tissue from the tube. °? Do not pull on the tube. °? You may need to strip   the tube several times every day to keep the tube clear. °4. Open the bulb cap or the drain plug. Do not touch the inside of the cap or the bottom of the plug. °5. Empty all of the drainage into the measuring cup. °6. Compress the bulb or the container and replace the cap or the plug. To compress the bulb or the container, squeeze it firmly in the middle while you close the cap or plug the container. °7. Write down the amount of drainage that you have in each 24-hour period. If you have less than 2 Tbsp (30 mL) of drainage during 24 hours, contact your health care  provider. °8. Flush the drainage down the toilet. °9. Wash your hands with soap and water. °Contact a health care provider if: °· You have more redness, swelling, or pain around your drain area. °· The amount of drainage that you have is increasing instead of decreasing. °· You have pus or a bad smell coming from your drain area. °· You have a fever. °· You have drainage that is cloudy. °· There is a sudden stop or a sudden decrease in the amount of drainage that you have. °· Your tube falls out. °· Your active drain does not stay compressed after you empty it. °This information is not intended to replace advice given to you by your health care provider. Make sure you discuss any questions you have with your health care provider. °Document Released: 01/10/2000 Document Revised: 06/20/2015 Document Reviewed: 08/01/2014 °[employer] Interactive Patient Education © 2018 [employer] ° °

## 2017-05-07 NOTE — Sedation Documentation (Signed)
Pt resting at this time. No complaints

## 2017-05-07 NOTE — Sedation Documentation (Signed)
Pt complains of pain at site of drain placement

## 2017-05-07 NOTE — Sedation Documentation (Signed)
Patient is resting comfortably. Vitals stable. 

## 2017-05-07 NOTE — H&P (Signed)
Chief Complaint: Patient was seen in consultation today for retroperitoneal drain exchange and replacement at the request of Katie [facility]  Referring Physician(s): [facility] Lark  Supervising Physician: Dr Rolla Plate  Patient Status: Katie [facility] - Out-pt  History of Present Illness: Katie [facility] is a 67 y.o. female   Dr Pascal Lux note 4/11: Katie [facility] is a 67 y.o. female with past medical history significant for type 1 diabetes and recent diagnosis of endometrial cancer who is status post hysterectomy, BSO and retroperitoneal lymph node dissection who presented to the emergency department on 04/29/2017 with abdominal pain and found to have bilateral retroperitoneal fluid collections, presumed seromas.  For this, the patient underwent bilateral percutaneous drainage catheter placement on 04/30/2017 by Dr. Barbie Banner.  Unfortunately, the right-sided percutaneous drainage catheter is malpositioned with end coiled and locked external to the grossly unchanged residual large right-sided retroperitoneal fluid collection again measuring approximately 9.7 cm in maximal diameter. The left-sided retroperitoneal drainage catheter has been retracted though the end remains within the peripheral aspects of the left-sided retroperitoneal fluid collection.  Unfortunately, the left-sided retroperitoneal fluid collection is also grossly unchanged since the presenting CT, again measuring approximately 13.1 cm in maximal diameter. PLAN: - Patient will be scheduled for right-sided percutaneous drainage catheter placement and left-sided percutaneous drainage catheter exchange/repositioning potential upsizing  Now scheduled for drain exchange and replacement   Past Medical History:  Diagnosis Date  . Depression   . Fibromyalgia   . Headache(784.0)   . History of colon polyps   . History of diabetes with ketoacidosis   . Hx of colonic polyps   . Hypertension   . Insomnia   . Insulin pump in place   . RBBB (right bundle  branch block)   . RLQ abdominal pain   . Thickened endometrium   . Type 1 diabetes mellitus with long-term current use of insulin [facility]) endocrinologist-- dr Chalmers Cater   July 2015--  pt took Invokana and caused complete pancreas shut-down , pt went from pre-diabetic to adult type 1 dm now insulin dependent  . Vertigo     Past Surgical History:  Procedure Laterality Date  . CARDIOVASCULAR STRESS TEST  07/26/2015   normal myocardial perfusion study w/ no ischemia/  normal LV function and wall motion , ef 71%  . COLONOSCOPY WITH PROPOFOL  last one 10/ 2017  . HYSTEROSCOPY W/D&C N/A 12/25/2015   Procedure: DILATATION AND CURETTAGE /HYSTEROSCOPY;  Surgeon: Bobbye Charleston, MD;  Location: [facility];  Service: Gynecology;  Laterality: N/A;  . IR RADIOLOGIST EVAL & MGMT  05/06/2017  . LAPAROSCOPIC LYSIS OF ADHESIONS N/A 12/25/2015   Procedure: LAPAROSCOPIC LYSIS OF ADHESIONS;  Surgeon: Bobbye Charleston, MD;  Location: [place];  Service: Gynecology;  Laterality: N/A;  . LAPAROSCOPIC UNILATERAL SALPINGO OOPHERECTOMY Right 12/25/2015   Procedure: LAPAROSCOPIC UNILATERAL SALPINGO OOPHORECTOMY;  Surgeon: Bobbye Charleston, MD;  Location: [place];  Service: Gynecology;  Laterality: Right;  . LAPAROSCOPY N/A 12/25/2015   Procedure: LAPAROSCOPY OPERATIVE;  Surgeon: Bobbye Charleston, MD;  Location[facility];  Service: Gynecology;  Laterality: N/A;  . LYMPH NODE BIOPSY N/A 04/15/2017   Procedure: SENTINEL LYMPH NODE BIOPSY, PERIAORTIC LYMPH NODES, AND VAGINAL BIOPSY;  Surgeon: Everitt Amber, MD;  Location: [facility];  Service: Gynecology;  Laterality: N/A;  . ROBOTIC ASSISTED TOTAL HYSTERECTOMY WITH BILATERAL SALPINGO OOPHERECTOMY Bilateral 04/15/2017   Procedure: XI ROBOTIC ASSISTED TOTAL LAPROSCOPIC  HYSTERECTOMY WITH LEFT SALPINGO OOPHORECTOMY;  Surgeon: Everitt Amber, MD;  Location: [facility];  Service: Gynecology;  Laterality: Bilateral;  .  TONSILLECTOMY  age 57  . TRANSTHORACIC ECHOCARDIOGRAM  07/26/2015   grade 1 diastolic dysfunction , ef 60-65%/  trivial TR    Allergies: Armodafinil; Invokana [canagliflozin]; Lasix [furosemide]; and Adhesive [tape]  Medications: Prior to Admission medications   Medication Sig Start Date End Date Taking? Authorizing Provider  ALPRAZolam (XANAX) 1 MG tablet TAKE 1/2 TO 1 TABLET BY MOUTH AT BEDTIME AS NEEDED FOR ANXIETY Patient taking differently: TAKE 1/2 TO 1 TABLET (0.5 - 1 MG)  BY MOUTH AT BEDTIME AS NEEDED FOR ANXIETY 05/05/16  Yes Laurey Morale, MD  DULoxetine (CYMBALTA) 30 MG capsule Take 90 mg by mouth every evening.  04/17/15   [provider]  ezetimibe (ZETIA) 10 MG tablet Take 10 mg by mouth daily. 09/16/16   [provider]  gabapentin (NEURONTIN) 300 MG capsule Take 2 capsules (600 mg total) by mouth 3 (three) times daily. Take 600 mg three times a day 05/02/17   Everitt Amber, MD  hydrocortisone cream 1 % Apply topically 2 (two) times daily. 05/02/17   Everitt Amber, MD  losartan-hydrochlorothiazide [facility]) 50-12.5 MG tablet TAKE 1 TABLET BY MOUTH EVERY DAY 04/12/17   Laurey Morale, MD  morphine (MS CONTIN) 15 MG 12 hr tablet Take 1 tablet (15 mg total) by mouth every 12 (twelve) hours. 05/02/17 06/01/17  Everitt Amber, MD  morphine (MSIR) 15 MG tablet Take 1 tablet (15 mg total) by mouth every 4 (four) hours as needed for severe pain. 05/02/17 06/01/17  Everitt Amber, MD  senna (SENOKOT) 8.6 MG TABS tablet Take 1 tablet (8.6 mg total) by mouth at bedtime. 05/02/17   Everitt Amber, MD  SUMAtriptan (IMITREX) 100 MG tablet TAKE 1 TABLET BY MOUTH AS NEEDED FOR MIGRAINES. MAY REPEAT IN 2 HOURS IF NEEDED 02/05/17   Laurey Morale, MD  traMADol (ULTRAM) 50 MG tablet TAKE 2 TABLETS BY MOUTH EVERY 4 HOURS AS NEEDED 05/06/17   Laurey Morale, MD  traZODone (DESYREL) 100 MG tablet Take 1 tablet by mouth at bedtime. 04/15/15   [provider]     Family History  Problem Relation Age of  Onset  . Alcohol abuse Father        Died age 6  . Heart disease Father   . Aneurysm Mother        Died age 57  . Leukemia Brother        Died age 11  . COPD Sister   . Rheum arthritis Sister   . Heart failure Sister   . Dementia Maternal Uncle   . Colon cancer Neg Hx     Social History   Socioeconomic History  . Marital status: Divorced    Spouse name: Not on file  . Number of children: 1  . Years of education: Not on file  . Highest education level: Not on file  Occupational History  . 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 Financial resource strain: Not on file  . Food insecurity:    Worry: Not on file    Inability: Not on file  . Transportation needs:    Medical: Not on file    Non-medical: Not on file  Tobacco Use  . Smoking status: Never Smoker  . Smokeless tobacco: Never Used  Substance and Sexual Activity  . Alcohol use: No    Alcohol/week: 0.0 oz  . Drug use: No  . Sexual activity: Not Currently    Birth  control/protection: Post-menopausal  Lifestyle  . Physical activity:    Days per week: Not on file    Minutes per session: Not on file  . Stress: Not on file  Relationships  . Social connections:    Talks on phone: Not on file    Gets together: Not on file    Attends religious service: Not on file    Active member of club or organization: Not on file    Attends meetings of clubs or organizations: Not on file    Relationship status: Not on file  Other Topics Concern  . Not on file  Social History Narrative   Divorced.  Son lives with her.  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    Review of Systems: A 12 point ROS discussed and pertinent positives are indicated in the HPI above.  All other systems are negative.  Review of Systems  Constitutional: Negative for activity change, fatigue and fever.  Respiratory: Negative for cough and shortness of breath.   Cardiovascular: Negative for chest pain.    Gastrointestinal: Negative for abdominal pain.  Musculoskeletal: Positive for back pain.  Psychiatric/Behavioral: Negative for behavioral problems and confusion.    Vital Signs: BP 114/67   Pulse 91   Temp 98.6 F (37 C) (Oral)   Ht 5\' 5"  (1.651 m)   Wt 220 lb (99.8 kg)   SpO2 94%   BMI 36.61 kg/m   Physical Exam  Constitutional: She is oriented to person, place, and time.  Cardiovascular: Normal rate, regular rhythm and normal heart sounds.  Pulmonary/Chest: Effort normal.  Abdominal: Soft. Bowel sounds are normal.  Musculoskeletal: Normal range of motion.  Neurological: She is alert and oriented to person, place, and time.  Skin: Skin is warm and dry.  Psychiatric: She has a normal mood and affect. Her behavior is normal. Judgment and thought content normal.  Nursing note and vitals reviewed.   Imaging: Ct Abdomen Pelvis W Contrast  Result Date: 05/06/2017 CLINICAL DATA:  History of hysterectomy and BSO and lymph node dissection for endometrial carcinoma who presented to the emergency department on 04/29/2017 with abdominal pain and subsequent CT scan demonstrating development of bilateral retroperitoneal fluid collections concerning for postoperative seromas and/or abscesses. As such, patient underwent bilateral CT-guided percutaneous drainage catheter placement on 04/30/2017 by Dr. Barbie Banner. Patient presents today to the interventional radiology drain Clinic for drainage catheter evaluation and management. EXAM: CT ABDOMEN AND PELVIS WITH CONTRAST TECHNIQUE: Multidetector CT imaging of the abdomen and pelvis was performed using the standard protocol following bolus administration of intravenous contrast. CONTRAST:  14mL ISOVUE-300 IOPAMIDOL (ISOVUE-300) INJECTION 61% COMPARISON:  CT abdomen pelvis-04/29/2017; 03/26/2017; CT-guided bilateral retroperitoneal drainage catheter placement-04/30/2017 FINDINGS: Lower chest: Limited visualization of the lower thorax demonstrates unchanged  punctate (approximately 0.4 cm) nodules within the right lower (image 4, series 4 and left lower (image 11, series 4) lobes. Minimal subsegmental atelectasis within the medial basilar aspect of the left lower lobe. No focal airspace opacities. No pleural effusion. Normal heart size.  No pericardial effusion. Hepatobiliary: Normal hepatic contour. There is a minimal amount of focal fatty infiltration adjacent to the fissure for ligamentum teres. No discrete hepatic lesions. Normal appearance of the gallbladder given degree distention. No radiopaque gallstones. Grossly unchanged very mild centralized intrahepatic biliary duct dilatation. No extrahepatic bili duct dilatation. No ascites. Pancreas: The pancreas remains slightly atrophic. Spleen: Normal appearance of the spleen. Adrenals/Urinary Tract: There is symmetric enhancement of the bilateral kidneys. No renal  stones in this postcontrast examination. No discrete renal lesions. No urinary obstruction or perinephric stranding. Normal appearance the bilateral adrenal glands. Normal appearance of the urinary bladder given underdistention. Stomach/Bowel: Moderate colonic stool burden without evidence of enteric obstruction. The cecum is noted to be located within the right mid abdomen. Normal appearance of the terminal ileum. The appendix is not visualized, however there is no pericecal inflammatory change. No pneumoperitoneum, pneumatosis or portal venous gas. Vascular/Lymphatic: Normal caliber the abdominal aorta. The major branch vessels of the abdominal aorta appear patent on this non CTA examination. No bulky retroperitoneal, mesenteric, pelvic or inguinal lymphadenopathy. Reproductive: Post hysterectomy and bilateral oophorectomy. No free fluid the pelvic cul-de-sac. Other: Right-sided retroperitoneal percutaneous drainage catheter has been retracted is currently located outside the residual right retroperitoneal fluid collection the right-sided retroperitoneal  fluid collection is grossly unchanged in size measuring approximately 10.5 x 4.8 x 9.7 cm (axial image 43, series 2, coronal image 77, series 6, previously, 9.3 x 3.5 x 9.6 cm. The right-sided retroperitoneal drainage catheter is located within the peripheral aspect of the left retroperitoneal fluid collection and the left-sided fluid collection is also grossly unchanged in size measuring approximately 9.6 x 5.4 x 13.1 cm (axial image 38, series 2, coronal image 81, series 6 previously, 9.5 x 4.8 x 14.1 cm. Musculoskeletal: No acute or aggressive osseous abnormalities. Stigmata of DISH within thoracic spine. Grade 1 anterolisthesis of L4 upon L5 measuring approximately 5 mm without associated pars defects, unchanged. Bilateral degenerative change of the lower lumbar spine. IMPRESSION: 1. Interval retraction of right-sided percutaneous drainage catheter with end now coiled and locked peripheral the grossly unchanged right-sided retroperitoneal fluid collection, presumed postoperative seroma. 2. Unchanged positioning of left-sided percutaneous drainage catheter with end coiled and locked within the peripheral aspect of the grossly unchanged left-sided retroperitoneal fluid collection, presumed postoperative seroma. PLAN: - The malpositioned right-sided retroperitoneal drainage catheter was removed at the patient's bedside without incident. - Above discussed with both Dr. Alvy Bimler and [name] and the decision was made to proceed with CT-guided replacement of the right-sided retroperitoneal percutaneous drainage catheter and exchange/up sizing of the left-sided retroperitoneal drainage catheter. This will be performed at an outpatient basis tomorrow [place] [facility] Dr. Earleen Newport. Electronically Signed   By: Sandi Mariscal M.D.   On: 05/06/2017 15:25   Ct Abdomen Pelvis W Contrast  Result Date: 04/29/2017 CLINICAL DATA:  2 weeks postop from hysterectomy, BSO, and lymph node dissection for endometrial carcinoma.  Severe left pelvic and left leg pain. EXAM: CT ABDOMEN AND PELVIS WITH CONTRAST TECHNIQUE: Multidetector CT imaging of the abdomen and pelvis was performed using the standard protocol following bolus administration of intravenous contrast. CONTRAST:  171mL ISOVUE-300 IOPAMIDOL (ISOVUE-300) INJECTION 61% COMPARISON:  03/26/2017 FINDINGS: Lower Chest: Tiny sub-cm bilateral lower lobe pulmonary nodules are stable. Hepatobiliary: No hepatic masses identified. Gallbladder is unremarkable. Pancreas:  No mass or inflammatory changes. Spleen: Within normal limits in size and appearance. Adrenals/Urinary Tract: No masses identified. No evidence of hydronephrosis. Unremarkable unopacified urinary bladder. Stomach/Bowel: No evidence of obstruction, inflammatory process or abnormal fluid collections. Vascular/Lymphatic: No pathologically enlarged lymph nodes. New multiloculated fluid collections are seen in the retroperitoneum in the left paraaortic and right pericaval spaces, with extension into the inferior perinephric spaces and common iliac chains bilaterally. Largest collection in the inferior left perinephric space measures 6.1 x 4.8 cm. These are consistent with postoperative lymphoceles. Reproductive: Prior hysterectomy. Adnexal regions are unremarkable in appearance. No fluid collections seen within hysterectomy bed or  elsewhere within the pelvis. Other:  None. Musculoskeletal:  No suspicious bone lesions identified. IMPRESSION: Multiloculated bilateral retroperitoneal fluid collections, consistent with postoperative lymphoceles. No evidence of hydronephrosis or other complication. Electronically Signed   By: Earle Gell M.D.   On: 04/29/2017 14:01   Ct Image Guided Drainage By Percutaneous Catheter  Result Date: 04/30/2017 INDICATION: Bilateral abdominal retroperitoneal abscess EXAM: CT GUIDED DRAINAGE OF ABDOMINAL ABSCESS X2 MEDICATIONS: The patient is currently admitted to the [facility] and receiving intravenous  antibiotics. The antibiotics were administered within an appropriate time frame prior to the initiation of the procedure. ANESTHESIA/SEDATION: Two mg IV Versed 100 mcg IV Fentanyl Moderate Sedation Time:  25 minutes The patient was continuously monitored during the procedure by the interventional radiology nurse under my direct supervision. COMPLICATIONS: None immediate. TECHNIQUE: Informed written consent was obtained from the patient after a thorough discussion of the procedural risks, benefits and alternatives. All questions were addressed. Maximal Sterile Barrier Technique was utilized including caps, mask, sterile gowns, sterile gloves, sterile drape, hand hygiene and skin antiseptic. A timeout was performed prior to the initiation of the procedure. PROCEDURE: The operative field was prepped with back in the prone position in a sterile fashion, and a sterile drape was applied covering the operative field. A sterile gown and sterile gloves were used for the procedure. Local anesthesia was provided with 1% Lidocaine. Under CT guidance, an 18 gauge needle was inserted into the left retroperitoneal abdominal fluid collection and removed over an Amplatz wire. Ten [place] dilator followed by a 10 [place] drain were inserted. It was looped and string fixed then sewn to the skin. The identical procedure was performed for the right retroperitoneal abdominal fluid collection. Cloudy yellow fluid was aspirated from each cavity and sent for culture. FINDINGS: Imaging demonstrates placement of bilateral 10 French abdominal abscess drains. IMPRESSION: Successful abdominal abscess drain x2. Electronically Signed   By: Marybelle Killings M.D.   On: 04/30/2017 17:58   Ct Image Guided Drainage By Percutaneous Catheter  Result Date: 04/30/2017 INDICATION: Bilateral abdominal retroperitoneal abscess EXAM: CT GUIDED DRAINAGE OF ABDOMINAL ABSCESS X2 MEDICATIONS: The patient is currently admitted to the [facility] and receiving intravenous  antibiotics. The antibiotics were administered within an appropriate time frame prior to the initiation of the procedure. ANESTHESIA/SEDATION: Two mg IV Versed 100 mcg IV Fentanyl Moderate Sedation Time:  25 minutes The patient was continuously monitored during the procedure by the interventional radiology nurse under my direct supervision. COMPLICATIONS: None immediate. TECHNIQUE: Informed written consent was obtained from the patient after a thorough discussion of the procedural risks, benefits and alternatives. All questions were addressed. Maximal Sterile Barrier Technique was utilized including caps, mask, sterile gowns, sterile gloves, sterile drape, hand hygiene and skin antiseptic. A timeout was performed prior to the initiation of the procedure. PROCEDURE: The operative field was prepped with back in the prone position in a sterile fashion, and a sterile drape was applied covering the operative field. A sterile gown and sterile gloves were used for the procedure. Local anesthesia was provided with 1% Lidocaine. Under CT guidance, an 18 gauge needle was inserted into the left retroperitoneal abdominal fluid collection and removed over an Amplatz wire. Ten [place] dilator followed by a 10 [place] drain were inserted. It was looped and string fixed then sewn to the skin. The identical procedure was performed for the right retroperitoneal abdominal fluid collection. Cloudy yellow fluid was aspirated from each cavity and sent for culture. FINDINGS: Imaging demonstrates placement of bilateral 10 [place]  abdominal abscess drains. IMPRESSION: Successful abdominal abscess drain x2. Electronically Signed   By: Marybelle Killings M.D.   On: 04/30/2017 17:58   Ir Radiologist Eval & Mgmt  Result Date: 05/06/2017 Please refer to notes tab for details about interventional procedure. (Op Note)   Labs:  CBC: Recent Labs    04/16/17 0411 04/29/17 1654 05/01/17 1040 05/02/17 0509  WBC 6.3 8.3 5.8 5.6  HGB 11.0* 13.5  12.0 12.8  HCT 34.6* 41.4 37.4 39.7  PLT 207 248 187 188    COAGS: Recent Labs    04/29/17 1654  INR 0.94    BMP: Recent Labs    04/16/17 0411 04/29/17 1654 05/01/17 1040 05/02/17 0509  NA 141 137 142 141  K 4.2 4.1 4.0 3.9  CL 105 100* 103 103  CO2 30 28 32 31  GLUCOSE 136* 351* 50* 83  BUN 14 15 16 17   CALCIUM 8.4* 9.2 8.7* 8.7*  CREATININE 0.82 0.86 0.89 0.77  GFRNONAA >60 >60 >60 >60  GFRAA >60 >60 >60 >60    LIVER FUNCTION TESTS: Recent Labs    10/29/16 1105 04/08/17 1225 04/29/17 1654  BILITOT 0.4 0.2* 0.9  AST 20 39 65*  ALT 19 45 89*  ALKPHOS 88 90 95  PROT 6.2 6.4* 6.8  ALBUMIN 3.6 3.1* 3.3*    TUMOR MARKERS: No results for input(s): AFPTM, CEA, CA199, CHROMGRNA in the last 8760 hours.  Assessment and Plan:  Bilat post op retroperitoneal seroma drains Malpositioned and malfunctioning Now for replacement and exchange Risks and benefits discussed with the patient including bleeding, infection, damage to adjacent structures, bowel perforation/fistula connection, and sepsis.  All of the patient's questions were answered, patient is agreeable to proceed. Consent signed and in chart.  Thank you for this interesting consult.  I greatly enjoyed meeting Almira Phetteplace and look forward to participating in their care.  A copy of this report was sent to the requesting provider on this date.  Electronically Signed: Lavonia Drafts, PA-C 05/07/2017, 1:16 PM   I spent a total of    25 Minutes in face to face in clinical consultation, greater than 50% of which was counseling/coordinating care for Bilateral RP abscess/seroma drain replace/exchane

## 2017-05-07 NOTE — Progress Notes (Signed)
Pt givem supplies and discharge instructions

## 2017-05-07 NOTE — Sedation Documentation (Signed)
Vital signs stable. 

## 2017-05-07 NOTE — Procedures (Signed)
Interventional Radiology Procedure Note  Procedure: CT guided drainage of persisting right retroperitoneal fluid with 43F biliary drain.   CT guided exchange of left 43F drainage catheter into retroperitoeal fluid.  43F biliary drain placed. .  Complications: None Recommendations:  - To bulb suction bilateral - Culture sent - Fluid for creatinine and triglycerides - Do not submerge  - Routine care   Signed,  Dulcy Fanny. Earleen Newport, DO

## 2017-05-07 NOTE — Progress Notes (Signed)
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 Vagina, biopsy - ATYPICAL CELLS, SUSPICIOUS BUT NOT DIAGNOSTIC OF MALIGNANCY. SEE NOTE 2. Lymph node, sentinel, biopsy, right presacral #1 - LYMPH NODE, NEGATIVE FOR MALIGNANCY (0/1) - IMMUNOSTAIN FOR PANKERATIN SHOWS FOCAL NONSPECIFIC LABELING 3. Lymph node, sentinel, biopsy, right presacral #2 - LYMPH NODE, NEGATIVE FOR MALIGNANCY (0/1) - IMMUNOSTAIN FOR PANKERATIN IS NEGATIVE 4. Lymph node, sentinel, biopsy, left external iliac - LYMPH NODE, NEGATIVE FOR MALIGNANCY (0/1) - IMMUNOSTAIN FOR PANKERATIN IS NEGATIVE 5. Lymph node, biopsy, right periaortic - TWO LYMPH NODES, NEGATIVE FOR MALIGNANCY (0/2) 6. Lymph node, biopsy, left periaortic - SEVEN LYMPH NODES, NEGATIVE FOR MALIGNANCY (0/7) 7. 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 SEE NOTE - TUMOR INVADES A LITTLE OVER HALF OF THE MYOMETRIUM (INVASION DEPTH OF 0.7 CM WHERE MYOMETRIUM IS 1.3 CM THICK) - SURGICAL MARGINS ARE NEGATIVE FOR MALIGNANCY - NEGATIVE FOR LYMPHOVASCULAR INVASION - NEGATIVE FOR CERVICAL INVOLVEMENT - LEFT OVARY WITH BENIGN SEROUS CYST AND ENDOSALPINGIOSIS - DEFINITIVE LEFT FALLOPIAN TUBE IS NOT IDENTIFIED  Past/Anticipated interventions by Gyn/Onc surgery, if any: 04/15/17 - Procedure: XI ROBOTIC ASSISTED TOTAL LAPROSCOPIC  HYSTERECTOMY WITH LEFT SALPINGO OOPHORECTOMY;  Surgeon: Everitt Amber, MD   Past/Anticipated interventions by medical oncology, if any: apt with Dr. Alvy Bimler 05/03/17  Weight changes, if any:   Bowel/Bladder complaints, if any: ,   Nausea/Vomiting, if any:   Pain issues, if any:    SAFETY ISSUES:  Prior radiation?   Pacemaker/ICD?   Possible current  pregnancy? no  Is the patient on methotrexate?   Current Complaints / other details:  Dr. Denman George is recommending chemotherapy with carb/tax x 6 cycles and vaginal brachy.

## 2017-05-08 LAB — CREATININE, BODY FLUID OTHER
CREATININE, BODY FLUID: 0.5 mg/dL
CREATININE, BODY FLUID: 0.6 mg/dL

## 2017-05-08 LAB — TRIGLYCERIDES, BODY FLUIDS
TRIGLYCERIDES FL: 14 mg/dL
Triglycerides, Fluid: 54 mg/dL

## 2017-05-12 LAB — AEROBIC/ANAEROBIC CULTURE (SURGICAL/DEEP WOUND): CULTURE: NO GROWTH

## 2017-05-12 LAB — AEROBIC/ANAEROBIC CULTURE W GRAM STAIN (SURGICAL/DEEP WOUND): Culture: NORMAL

## 2017-05-13 ENCOUNTER — Ambulatory Visit (HOSPITAL_COMMUNITY)
Admission: RE | Admit: 2017-05-13 | Discharge: 2017-05-13 | Disposition: A | Discharge status: Home / Self Care | Payer: Medicare Other | Source: Ambulatory Visit | Attending: Interventional Radiology | Admitting: Interventional Radiology

## 2017-05-13 ENCOUNTER — Other Ambulatory Visit (HOSPITAL_COMMUNITY): Payer: Self-pay | Admitting: Interventional Radiology

## 2017-05-13 ENCOUNTER — Encounter: Payer: Self-pay | Admitting: Radiology

## 2017-05-13 ENCOUNTER — Telehealth: Payer: Self-pay

## 2017-05-13 DIAGNOSIS — L0291 Cutaneous abscess, unspecified: Secondary | ICD-10-CM

## 2017-05-13 DIAGNOSIS — Z4803 Encounter for change or removal of drains: Secondary | ICD-10-CM | POA: Insufficient documentation

## 2017-05-13 HISTORY — PX: IR RADIOLOGIST EVAL & MGMT: IMG5224

## 2017-05-13 MED ORDER — IOPAMIDOL (ISOVUE-300) INJECTION 61%
INTRAVENOUS | Status: AC
  Filled 2017-05-13: qty 50

## 2017-05-13 MED ORDER — LIDOCAINE HCL 1 % IJ SOLN
INTRAMUSCULAR | Status: AC
  Filled 2017-05-13: qty 20

## 2017-05-13 NOTE — Progress Notes (Signed)
Patient ID: Katie [facility], female   DOB: [birth year], 67 y.o.   MRN: [id number]  Pt called clinic today with c/o that left drain not working properly. Pt with bilateral RP drains, recently exchanged on 4/11.  Pt sent to [facility] for expected simple drain exchange under flouro. However, pt reports there has been no drainage from left drain, she did get it 'tugged' on' a little. Unknown if drain is in appropriate position given that it is no longer draining. Still some output from right drain into bag, clear.  D.w Dr. Laurence Ferrari. Do not feel there is any value in fluoro procedure today, pt needs new CT A/P to assess drain position and fluid collection resolution. She has appt scheduled for next wee 4/25 for CT and office eval, will coordinate to move appt to Tues 4/23.  We capped the (L)drain as there has been no output, but gave the pt a bag to reconnect if she experiences drainage from the skin site.  Ascencion Dike PA-C Interventional Radiology 05/13/2017 2:35 PM

## 2017-05-13 NOTE — Telephone Encounter (Signed)
Pt states that she is straining to have a BM. She has some low abdominal pain. Pt currently taking 2 senokot bid. Told her to add Miralax 1 capful  Per [name] Cross,NP. Pt verbalized understanding.

## 2017-05-14 ENCOUNTER — Telehealth: Payer: Self-pay | Admitting: *Deleted

## 2017-05-14 ENCOUNTER — Other Ambulatory Visit: Payer: Self-pay | Admitting: Gynecologic Oncology

## 2017-05-14 DIAGNOSIS — M79605 Pain in left leg: Secondary | ICD-10-CM

## 2017-05-14 MED ORDER — MORPHINE SULFATE 15 MG PO TABS: 15.0000 mg | ORAL_TABLET | ORAL | 0 refills | Status: DC | PRN

## 2017-05-14 NOTE — Telephone Encounter (Signed)
Return the patient's call and moved her April 23rd appt up to the morning.

## 2017-05-18 ENCOUNTER — Ambulatory Visit
Admission: RE | Admit: 2017-05-18 | Discharge: 2017-05-18 | Disposition: A | Discharge status: Home / Self Care | Payer: Medicare Other | Source: Ambulatory Visit | Attending: Hematology and Oncology | Admitting: Hematology and Oncology

## 2017-05-18 ENCOUNTER — Encounter: Payer: Self-pay | Admitting: Gynecologic Oncology

## 2017-05-18 ENCOUNTER — Inpatient Hospital Stay: Payer: Medicare Other | Admitting: Gynecologic Oncology

## 2017-05-18 ENCOUNTER — Other Ambulatory Visit (HOSPITAL_COMMUNITY): Payer: Self-pay | Admitting: Interventional Radiology

## 2017-05-18 VITALS — BP 125/76 | HR 92 | Temp 98.7°F | Resp 16 | Ht 65.0 in | Wt 218.5 lb

## 2017-05-18 DIAGNOSIS — M792 Neuralgia and neuritis, unspecified: Secondary | ICD-10-CM

## 2017-05-18 DIAGNOSIS — C541 Malignant neoplasm of endometrium: Secondary | ICD-10-CM | POA: Diagnosis not present

## 2017-05-18 DIAGNOSIS — R188 Other ascites: Secondary | ICD-10-CM

## 2017-05-18 DIAGNOSIS — L0291 Cutaneous abscess, unspecified: Secondary | ICD-10-CM

## 2017-05-18 DIAGNOSIS — N739 Female pelvic inflammatory disease, unspecified: Secondary | ICD-10-CM

## 2017-05-18 DIAGNOSIS — IMO0002 Reserved for concepts with insufficient information to code with codable children: Secondary | ICD-10-CM

## 2017-05-18 DIAGNOSIS — Z7189 Other specified counseling: Secondary | ICD-10-CM

## 2017-05-18 HISTORY — PX: IR RADIOLOGIST EVAL & MGMT: IMG5224

## 2017-05-18 MED ORDER — GABAPENTIN 400 MG PO CAPS: 400.0000 mg | ORAL_CAPSULE | Freq: Every day | ORAL | 0 refills | Status: DC

## 2017-05-18 MED ORDER — GABAPENTIN 100 MG PO CAPS: 200.0000 mg | ORAL_CAPSULE | Freq: Two times a day (BID) | ORAL | 0 refills | Status: DC

## 2017-05-18 MED ORDER — GABAPENTIN 100 MG PO CAPS: ORAL_CAPSULE | ORAL | 0 refills | Status: DC

## 2017-05-18 MED ORDER — IOPAMIDOL (ISOVUE-300) INJECTION 61%
125.0000 mL | Freq: Once | INTRAVENOUS | Status: AC | PRN
  Administered 2017-05-18: 125 mL via INTRAVENOUS

## 2017-05-18 NOTE — Progress Notes (Addendum)
Chief Complaint: Post op pelvic fluid collections  Referring Physician(s): [place]  Supervising Physician: Marybelle Killings  History of Present Illness: Katie Palmer is a 67 y.o. 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    She presented on 04/29/2017 with week long history of pelvic pressure as well as progressive left greater than right lower extremity pain.    Subsequent imaging has revealed multiloculated bilateral retroperitoneal fluid collections.  She underwent placement of 2 transgluteal drains by Dr. Barbie Banner on 04/30/2017.  She was seen in drain clinic on 05/06/2017. Her right drain was pulled out some and she had to have an exchange of that drain which was done on 05/07/17.  She is here again today for f/u.  She reports there is nothing coming out of either drain. The left drain was capped at the last visit.  Upon questioning her, she was flushing the drains incorrectly. She was actually flushing the tubing into the bulb instead of flushing the drain.  She denies fever/chills, n/v, or any pain.  Past Medical History:  Diagnosis Date  . Depression   . Fibromyalgia   . Headache(784.0)   . History of colon polyps   . History of diabetes with ketoacidosis   . Hx of colonic polyps   . Hypertension   . Insomnia   . Insulin pump in place   . RBBB (right bundle branch block)   . RLQ abdominal pain   . Thickened endometrium   . Type 1 diabetes mellitus with long-term current use of insulin [facility]) endocrinologist-- dr Chalmers Cater   July 2015--  pt took Invokana and caused complete pancreas shut-down , pt went from pre-diabetic to adult type 1 dm now insulin dependent  . Vertigo     Past Surgical History:  Procedure Laterality Date  . CARDIOVASCULAR STRESS TEST  07/26/2015   normal  myocardial perfusion study w/ no ischemia/  normal LV function and wall motion , ef 71%  . COLONOSCOPY WITH PROPOFOL  last one 10/ 2017  . HYSTEROSCOPY W/D&C N/A 12/25/2015   Procedure: DILATATION AND CURETTAGE /HYSTEROSCOPY;  Surgeon: Bobbye Charleston, MD;  Location: [facility];  Service: Gynecology;  Laterality: N/A;  . IR RADIOLOGIST EVAL & MGMT  05/06/2017  . LAPAROSCOPIC LYSIS OF ADHESIONS N/A 12/25/2015   Procedure: LAPAROSCOPIC LYSIS OF ADHESIONS;  Surgeon: Bobbye Charleston, MD;  Location: [place] [facility] [place];  Service: Gynecology;  Laterality: N/A;  . LAPAROSCOPIC UNILATERAL SALPINGO OOPHERECTOMY Right 12/25/2015   Procedure: LAPAROSCOPIC UNILATERAL SALPINGO OOPHORECTOMY;  Surgeon: Bobbye Charleston, MD;  Location: [place];  Service: Gynecology;  Laterality: Right;  . LAPAROSCOPY N/A 12/25/2015   Procedure: LAPAROSCOPY OPERATIVE;  Surgeon: Bobbye Charleston, MD;  Location: [facility];  Service: Gynecology;  Laterality: N/A;  . LYMPH NODE BIOPSY N/A 04/15/2017   Procedure: SENTINEL LYMPH NODE BIOPSY, PERIAORTIC LYMPH NODES, AND VAGINAL BIOPSY;  Surgeon: Everitt Amber, MD;  Location: [facility];  Service: Gynecology;  Laterality: N/A;  . ROBOTIC ASSISTED TOTAL HYSTERECTOMY WITH BILATERAL SALPINGO OOPHERECTOMY Bilateral 04/15/2017   Procedure: XI ROBOTIC ASSISTED TOTAL LAPROSCOPIC  HYSTERECTOMY WITH LEFT SALPINGO OOPHORECTOMY;  Surgeon: Everitt Amber, MD;  Location: [facility];  Service: Gynecology;  Laterality: Bilateral;  . TONSILLECTOMY  age 41  . TRANSTHORACIC ECHOCARDIOGRAM  07/26/2015   grade 1 diastolic dysfunction , ef 60-65%/  trivial TR  Allergies: Armodafinil; Invokana [canagliflozin]; Lasix [furosemide]; and Adhesive [tape]  Medications: Prior to Admission medications   Medication Sig Start Date End Date Taking? Authorizing Provider  Acetaminophen (TYLENOL PO) Take 1 tablet by mouth every 6 (six) hours as needed (pain/headache).     [provider]  ALPRAZolam (XANAX) 1 MG tablet TAKE 1/2 TO 1 TABLET BY MOUTH AT BEDTIME AS NEEDED FOR ANXIETY Patient taking differently: TAKE 1/2 TO 1 TABLET (0.5 - 1 MG)  BY MOUTH AT BEDTIME AS NEEDED FOR ANXIETY 05/05/16   Laurey Morale, MD  DULoxetine (CYMBALTA) 30 MG capsule Take 90 mg by mouth at bedtime.  04/17/15   [provider]  ezetimibe (ZETIA) 10 MG tablet Take 10 mg by mouth at bedtime.  09/16/16   [provider]  gabapentin (NEURONTIN) 100 MG capsule Take 200 mg two times a day 05/18/17   [name] D, NP  gabapentin (NEURONTIN) 400 MG capsule Take 1 capsule (400 mg total) by mouth at bedtime. 05/18/17   Everitt Amber, MD  glucagon (GLUCAGON EMERGENCY) 1 MG injection Inject 1 mg into the muscle once as needed (low blood sugar).    [provider]  hydrocortisone cream 1 % Apply topically 2 (two) times daily. Patient taking differently: Apply 1 application topically 2 (two) times daily as needed for itching.  05/02/17   Everitt Amber, MD  Insulin Human (INSULIN PUMP) SOLN Inject into the skin continuous. Use with Novolog    [provider]  losartan-hydrochlorothiazide (HYZAAR) 50-12.5 MG tablet TAKE 1 TABLET BY MOUTH EVERY DAY 04/12/17   Laurey Morale, MD  morphine (MS CONTIN) 15 MG 12 hr tablet Take 1 tablet (15 mg total) by mouth every 12 (twelve) hours. Patient taking differently: Take 15 mg by mouth every 12 (twelve) hours as needed for pain.  05/02/17 06/01/17  Everitt Amber, MD  morphine (MSIR) 15 MG tablet Take 1 tablet (15 mg total) by mouth every 4 (four) hours as needed for severe pain. 05/14/17 06/13/17  [name] D, NP  senna (SENOKOT) 8.6 MG TABS tablet Take 1 tablet (8.6 mg total) by mouth at bedtime. 05/02/17   Everitt Amber, MD  SUMAtriptan (IMITREX) 100 MG tablet TAKE 1 TABLET BY MOUTH AS NEEDED FOR MIGRAINES. MAY REPEAT IN 2 HOURS IF NEEDED Patient taking differently: Take 100 mg by mouth every 2 (two) hours as needed for migraine.   02/05/17   Laurey Morale, MD  traMADol (ULTRAM) 50 MG tablet TAKE 2 TABLETS BY MOUTH EVERY 4 HOURS AS NEEDED Patient taking differently: TAKE 1 TABLET (50 MG) BY MOUTH EVERY 4 HOURS AS NEEDED FOR PAIN 05/06/17   Laurey Morale, MD  traZODone (DESYREL) 100 MG tablet Take 100 mg by mouth at bedtime.  04/15/15   [provider]  Vitamin D, Ergocalciferol, (DRISDOL) 50000 units CAPS capsule Take 50,000 Units by mouth every Sunday.    [provider]     Family History  Problem Relation Age of Onset  . Alcohol abuse Father        Died age 51  . Heart disease Father   . Aneurysm Mother        Died age 73  . Leukemia Brother        Died age 27  . COPD Sister   . Rheum arthritis Sister   . Heart failure Sister   . Dementia Maternal Uncle   . Colon cancer Neg Hx     Social History  Socioeconomic History  . Marital status: Divorced    Spouse name: Not on file  . Number of children: 1  . Years of education: Not on file  . Highest education level: Not on file  Occupational History  . 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 Financial resource strain: Not on file  . Food insecurity:    Worry: Not on file    Inability: Not on file  . Transportation needs:    Medical: Not on file    Non-medical: Not on file  Tobacco Use  . Smoking status: Never Smoker  . Smokeless tobacco: Never Used  Substance and Sexual Activity  . Alcohol use: No    Alcohol/week: 0.0 oz  . Drug use: No  . Sexual activity: Not Currently    Birth control/protection: Post-menopausal  Lifestyle  . Physical activity:    Days per week: Not on file    Minutes per session: Not on file  . Stress: Not on file  Relationships  . Social connections:    Talks on phone: Not on file    Gets together: Not on file    Attends religious service: Not on file    Active member of club or organization: Not on file    Attends meetings of clubs or organizations: Not on file    Relationship status: Not  on file  Other Topics Concern  . Not on file  Social History Narrative   Divorced.  Son lives with her.  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   Review of Systems: A 12 point ROS discussed and pertinent positives are indicated in the HPI above.  All other systems are negative. Review of Systems  Vital Signs: BP (!) 95/54   Pulse 90   Temp 98.7 F (37.1 C) (Oral)   Resp 16   Ht 5\' 5"  (1.651 m)   Wt 218 lb (98.9 kg)   SpO2 93%   BMI 36.28 kg/m   Physical Exam Awake and alert Both drains in place She did not have a bulb attached to the right drain and the left drain was capped at last visit. I flushed the right drain and was able to get it unclogged. I attached it to a new bulb. The left drain does not flush at all.    Imaging: Ct Abdomen Pelvis W Contrast  Result Date: 05/18/2017 CLINICAL DATA:  Follow-up retroperitoneal drainage EXAM: CT ABDOMEN AND PELVIS WITH CONTRAST TECHNIQUE: Multidetector CT imaging of the abdomen and pelvis was performed using the standard protocol following bolus administration of intravenous contrast. CONTRAST:  117mL ISOVUE-300 IOPAMIDOL (ISOVUE-300) INJECTION 61% COMPARISON:  05/06/2017 FINDINGS: Lower chest: Dependent atelectasis Hepatobiliary: Nonspecific mild gallbladder wall thickening. No liver lesion. Pancreas: Unremarkable Spleen: Unremarkable Adrenals/Urinary Tract: Stable mild hydronephrosis. Adrenal glands are unremarkable. Bladder decompressed. Stomach/Bowel: Moderate stool burden throughout the colon. No evidence of small-bowel obstruction. Normal appendix. Stomach is distended with enteric contents. Vascular/Lymphatic: No evidence of aortic aneurysm. Reproductive: Post hysterectomy Other: There has been further improvement regarding the retroperitoneal fluid collections. The right drain has been repositioned into the more central retroperitoneal fluid collection. The perinephric fluid collection on the right has  resolved. Today, the fluid collection layering over the right psoas muscle measures 2.3 x 3.4 cm which is improved. The drain is coiled within this fluid collection. On the left, the more lateral fluid collection has been drained and is now resolved. There is a separate more anterior fluid collection which  is larger and today measures 4.4 x 4.5 cm. The drain is adjacent to this fluid collection. There is stranding surrounding the lower abdominal aorta and within the retroperitoneum likely due to inflammatory change. This has increased since the prior study. Musculoskeletal: No vertebral compression deformity. Degenerative changes. IMPRESSION: Right retroperitoneal fluid collections are improved. The right drain has been repositioned into the more anterior fluid collection which is smaller than on the prior study. The lateral left retroperitoneal fluid collection has resolved after drainage. The more medial and anterior fluid collection has increased in size. The drain is not coiled in this more medial fluid collection. Electronically Signed   By: Marybelle Killings M.D.   On: 05/18/2017 13:12   Ct Abdomen Pelvis W Contrast  Result Date: 05/06/2017 CLINICAL DATA:  History of hysterectomy and BSO and lymph node dissection for endometrial carcinoma who presented to the emergency department on 04/29/2017 with abdominal pain and subsequent CT scan demonstrating development of bilateral retroperitoneal fluid collections concerning for postoperative seromas and/or abscesses. As such, patient underwent bilateral CT-guided percutaneous drainage catheter placement on 04/30/2017 by Dr. Barbie Banner. Patient presents today to the interventional radiology drain Clinic for drainage catheter evaluation and management. EXAM: CT ABDOMEN AND PELVIS WITH CONTRAST TECHNIQUE: Multidetector CT imaging of the abdomen and pelvis was performed using the standard protocol following bolus administration of intravenous contrast. CONTRAST:  17mL  ISOVUE-300 IOPAMIDOL (ISOVUE-300) INJECTION 61% COMPARISON:  CT abdomen pelvis-04/29/2017; 03/26/2017; CT-guided bilateral retroperitoneal drainage catheter placement-04/30/2017 FINDINGS: Lower chest: Limited visualization of the lower thorax demonstrates unchanged punctate (approximately 0.4 cm) nodules within the right lower (image 4, series 4 and left lower (image 11, series 4) lobes. Minimal subsegmental atelectasis within the medial basilar aspect of the left lower lobe. No focal airspace opacities. No pleural effusion. Normal heart size.  No pericardial effusion. Hepatobiliary: Normal hepatic contour. There is a minimal amount of focal fatty infiltration adjacent to the fissure for ligamentum teres. No discrete hepatic lesions. Normal appearance of the gallbladder given degree distention. No radiopaque gallstones. Grossly unchanged very mild centralized intrahepatic biliary duct dilatation. No extrahepatic bili duct dilatation. No ascites. Pancreas: The pancreas remains slightly atrophic. Spleen: Normal appearance of the spleen. Adrenals/Urinary Tract: There is symmetric enhancement of the bilateral kidneys. No renal stones in this postcontrast examination. No discrete renal lesions. No urinary obstruction or perinephric stranding. Normal appearance the bilateral adrenal glands. Normal appearance of the urinary bladder given underdistention. Stomach/Bowel: Moderate colonic stool burden without evidence of enteric obstruction. The cecum is noted to be located within the right mid abdomen. Normal appearance of the terminal ileum. The appendix is not visualized, however there is no pericecal inflammatory change. No pneumoperitoneum, pneumatosis or portal venous gas. Vascular/Lymphatic: Normal caliber the abdominal aorta. The major branch vessels of the abdominal aorta appear patent on this non CTA examination. No bulky retroperitoneal, mesenteric, pelvic or inguinal lymphadenopathy. Reproductive: Post  hysterectomy and bilateral oophorectomy. No free fluid the pelvic cul-de-sac. Other: Right-sided retroperitoneal percutaneous drainage catheter has been retracted is currently located outside the residual right retroperitoneal fluid collection the right-sided retroperitoneal fluid collection is grossly unchanged in size measuring approximately 10.5 x 4.8 x 9.7 cm (axial image 43, series 2, coronal image 77, series 6, previously, 9.3 x 3.5 x 9.6 cm. The right-sided retroperitoneal drainage catheter is located within the peripheral aspect of the left retroperitoneal fluid collection and the left-sided fluid collection is also grossly unchanged in size measuring approximately 9.6 x 5.4 x 13.1 cm (axial image 38, series  2, coronal image 81, series 6 previously, 9.5 x 4.8 x 14.1 cm. Musculoskeletal: No acute or aggressive osseous abnormalities. Stigmata of DISH within thoracic spine. Grade 1 anterolisthesis of L4 upon L5 measuring approximately 5 mm without associated pars defects, unchanged. Bilateral degenerative change of the lower lumbar spine. IMPRESSION: 1. Interval retraction of right-sided percutaneous drainage catheter with end now coiled and locked peripheral the grossly unchanged right-sided retroperitoneal fluid collection, presumed postoperative seroma. 2. Unchanged positioning of left-sided percutaneous drainage catheter with end coiled and locked within the peripheral aspect of the grossly unchanged left-sided retroperitoneal fluid collection, presumed postoperative seroma. PLAN: - The malpositioned right-sided retroperitoneal drainage catheter was removed at the patient's bedside without incident. - Above discussed with both Dr. Alvy Bimler and [name] and the decision was made to proceed with CT-guided replacement of the right-sided retroperitoneal percutaneous drainage catheter and exchange/up sizing of the left-sided retroperitoneal drainage catheter. This will be performed at an outpatient basis  tomorrow [facility] Dr. Earleen Newport. Electronically Signed   By: Sandi Mariscal M.D.   On: 05/06/2017 15:25   Ct Abdomen Pelvis W Contrast  Result Date: 04/29/2017 CLINICAL DATA:  2 weeks postop from hysterectomy, BSO, and lymph node dissection for endometrial carcinoma. Severe left pelvic and left leg pain. EXAM: CT ABDOMEN AND PELVIS WITH CONTRAST TECHNIQUE: Multidetector CT imaging of the abdomen and pelvis was performed using the standard protocol following bolus administration of intravenous contrast. CONTRAST:  115mL ISOVUE-300 IOPAMIDOL (ISOVUE-300) INJECTION 61% COMPARISON:  03/26/2017 FINDINGS: Lower Chest: Tiny sub-cm bilateral lower lobe pulmonary nodules are stable. Hepatobiliary: No hepatic masses identified. Gallbladder is unremarkable. Pancreas:  No mass or inflammatory changes. Spleen: Within normal limits in size and appearance. Adrenals/Urinary Tract: No masses identified. No evidence of hydronephrosis. Unremarkable unopacified urinary bladder. Stomach/Bowel: No evidence of obstruction, inflammatory process or abnormal fluid collections. Vascular/Lymphatic: No pathologically enlarged lymph nodes. New multiloculated fluid collections are seen in the retroperitoneum in the left paraaortic and right pericaval spaces, with extension into the inferior perinephric spaces and common iliac chains bilaterally. Largest collection in the inferior left perinephric space measures 6.1 x 4.8 cm. These are consistent with postoperative lymphoceles. Reproductive: Prior hysterectomy. Adnexal regions are unremarkable in appearance. No fluid collections seen within hysterectomy bed or elsewhere within the pelvis. Other:  None. Musculoskeletal:  No suspicious bone lesions identified. IMPRESSION: Multiloculated bilateral retroperitoneal fluid collections, consistent with postoperative lymphoceles. No evidence of hydronephrosis or other complication. Electronically Signed   By: Earle Gell M.D.   On: 04/29/2017 14:01    Ct Image Guided Drainage By Percutaneous Catheter  Result Date: 05/07/2017 INDICATION: 67 year old female with a history of persisting seroma of the retroperitoneum. Right-sided tube was removed yesterday. Left-sided tube is dysfunctional. EXAM: CT-GUIDED PLACEMENT OF RIGHT RETROPERITONEAL FLUID COLLECTION DRAIN CT GUIDED EXCHANGE OF LEFT RETROPERITONEAL FLUID DRAIN MEDICATIONS: The patient is currently admitted to the hospital and receiving intravenous antibiotics. The antibiotics were administered within an appropriate time frame prior to the initiation of the procedure. ANESTHESIA/SEDATION: 2.0 mg IV Versed 100 mcg IV Fentanyl Moderate Sedation Time:  20 minutes The patient was continuously monitored during the procedure by the interventional radiology nurse under my direct supervision. COMPLICATIONS: None immediate. TECHNIQUE: Informed written consent was obtained from the patient after a thorough discussion of the procedural risks, benefits and alternatives. All questions were addressed. Maximal Sterile Barrier Technique was utilized including caps, mask, sterile gowns, sterile gloves, sterile drape, hand hygiene and skin antiseptic. A timeout was performed prior to the  initiation of the procedure. PROCEDURE: The bilateral flank including the indwelling left-sided drain was prepped with chlorhexidine in a sterile fashion, and a patient is position prone and CT imaging of the abdomen performed. Sterile drape was applied covering the operative field. A sterile gown and sterile gloves were used for the procedure. Local anesthesia was provided with 1% Lidocaine. Using modified Seldinger technique, a 10 [place] biliary drain was placed into the right-sided fluid collection. Modified Seldinger technique was then used to exchange the left-sided indwelling drain for a new 10 [place] biliary drain. Aspiration was performed of both fluid collection. Both drains were sutured in position and attached to bulb suction.  Final image was stored. Patient tolerated the procedure well and remained hemodynamically stable throughout. No complications were encountered and no significant blood loss. Sample sent the lab from each side, for culture, triglycerides, creatinine. FINDINGS: Initial CT demonstrates residual fluid collection adjacent to the indwelling left drain. Fluid in the right-sided retroperitoneal region. Final image demonstrates decompression of bilateral retroperitoneal fluid collection with the biliary drains in position. IMPRESSION: Status post CT guided exchange of left-sided retroperitoneal drain for a new 10 [place] biliary drain, as well as CT-guided placement of a biliary drain into a right-sided persisting retroperitoneal fluid collection. Signed, Dulcy Fanny. Earleen Newport, DO [facility] Radiology Electronically Signed   By: Corrie Mckusick D.O.   On: 05/07/2017 17:17   Ct Image Guided Drainage By Percutaneous Catheter  Result Date: 05/07/2017 INDICATION: 67 year old female with a history of persisting seroma of the retroperitoneum. Right-sided tube was removed yesterday. Left-sided tube is dysfunctional. EXAM: CT-GUIDED PLACEMENT OF RIGHT RETROPERITONEAL FLUID COLLECTION DRAIN CT GUIDED EXCHANGE OF LEFT RETROPERITONEAL FLUID DRAIN MEDICATIONS: The patient is currently admitted to the hospital and receiving intravenous antibiotics. The antibiotics were administered within an appropriate time frame prior to the initiation of the procedure. ANESTHESIA/SEDATION: 2.0 mg IV Versed 100 mcg IV Fentanyl Moderate Sedation Time:  20 minutes The patient was continuously monitored during the procedure by the interventional radiology nurse under my direct supervision. COMPLICATIONS: None immediate. TECHNIQUE: Informed written consent was obtained from the patient after a thorough discussion of the procedural risks, benefits and alternatives. All questions were addressed. Maximal Sterile  Barrier Technique was utilized including caps, mask, sterile gowns, sterile gloves, sterile drape, hand hygiene and skin antiseptic. A timeout was performed prior to the initiation of the procedure. PROCEDURE: The bilateral flank including the indwelling left-sided drain was prepped with chlorhexidine in a sterile fashion, and a patient is position prone and CT imaging of the abdomen performed. Sterile drape was applied covering the operative field. A sterile gown and sterile gloves were used for the procedure. Local anesthesia was provided with 1% Lidocaine. Using modified Seldinger technique, a 10 [place] biliary drain was placed into the right-sided fluid collection. Modified Seldinger technique was then used to exchange the left-sided indwelling drain for a new 10 [place] biliary drain. Aspiration was performed of both fluid collection. Both drains were sutured in position and attached to bulb suction. Final image was stored. Patient tolerated the procedure well and remained hemodynamically stable throughout. No complications were encountered and no significant blood loss. Sample sent the lab from each side, for culture, triglycerides, creatinine. FINDINGS: Initial CT demonstrates residual fluid collection adjacent to the indwelling left drain. Fluid in the right-sided retroperitoneal region. Final image demonstrates decompression of bilateral retroperitoneal fluid collection with the biliary drains in position. IMPRESSION: Status post CT guided exchange of left-sided retroperitoneal drain for  a new 20 [place] biliary drain, as well as CT-guided placement of a biliary drain into a right-sided persisting retroperitoneal fluid collection. Signed, Dulcy Fanny. Earleen Newport, DO [facility] Radiology Electronically Signed   By: Corrie Mckusick D.O.   On: 05/07/2017 17:17   Ct Image Guided Drainage By Percutaneous Catheter  Result Date: 04/30/2017 INDICATION: Bilateral abdominal  retroperitoneal abscess EXAM: CT GUIDED DRAINAGE OF ABDOMINAL ABSCESS X2 MEDICATIONS: The patient is currently admitted to the hospital and receiving intravenous antibiotics. The antibiotics were administered within an appropriate time frame prior to the initiation of the procedure. ANESTHESIA/SEDATION: Two mg IV Versed 100 mcg IV Fentanyl Moderate Sedation Time:  25 minutes The patient was continuously monitored during the procedure by the interventional radiology nurse under my direct supervision. COMPLICATIONS: None immediate. TECHNIQUE: Informed written consent was obtained from the patient after a thorough discussion of the procedural risks, benefits and alternatives. All questions were addressed. Maximal Sterile Barrier Technique was utilized including caps, mask, sterile gowns, sterile gloves, sterile drape, hand hygiene and skin antiseptic. A timeout was performed prior to the initiation of the procedure. PROCEDURE: The operative field was prepped with back in the prone position in a sterile fashion, and a sterile drape was applied covering the operative field. A sterile gown and sterile gloves were used for the procedure. Local anesthesia was provided with 1% Lidocaine. Under CT guidance, an 18 gauge needle was inserted into the left retroperitoneal abdominal fluid collection and removed over an Amplatz wire. Ten [place] dilator followed by a 10 [place] drain were inserted. It was looped and string fixed then sewn to the skin. The identical procedure was performed for the right retroperitoneal abdominal fluid collection. Cloudy yellow fluid was aspirated from each cavity and sent for culture. FINDINGS: Imaging demonstrates placement of bilateral 10 French abdominal abscess drains. IMPRESSION: Successful abdominal abscess drain x2. Electronically Signed   By: Marybelle Killings M.D.   On: 04/30/2017 17:58   Ct Image Guided Drainage By Percutaneous Catheter  Result Date: 04/30/2017 INDICATION: Bilateral abdominal  retroperitoneal abscess EXAM: CT GUIDED DRAINAGE OF ABDOMINAL ABSCESS X2 MEDICATIONS: The patient is currently admitted to the hospital and receiving intravenous antibiotics. The antibiotics were administered within an appropriate time frame prior to the initiation of the procedure. ANESTHESIA/SEDATION: Two mg IV Versed 100 mcg IV Fentanyl Moderate Sedation Time:  25 minutes The patient was continuously monitored during the procedure by the interventional radiology nurse under my direct supervision. COMPLICATIONS: None immediate. TECHNIQUE: Informed written consent was obtained from the patient after a thorough discussion of the procedural risks, benefits and alternatives. All questions were addressed. Maximal Sterile Barrier Technique was utilized including caps, mask, sterile gowns, sterile gloves, sterile drape, hand hygiene and skin antiseptic. A timeout was performed prior to the initiation of the procedure. PROCEDURE: The operative field was prepped with back in the prone position in a sterile fashion, and a sterile drape was applied covering the operative field. A sterile gown and sterile gloves were used for the procedure. Local anesthesia was provided with 1% Lidocaine. Under CT guidance, an 18 gauge needle was inserted into the left retroperitoneal abdominal fluid collection and removed over an Amplatz wire. Ten [place] dilator followed by a 10 [place] drain were inserted. It was looped and string fixed then sewn to the skin. The identical procedure was performed for the right retroperitoneal abdominal fluid collection. Cloudy yellow fluid was aspirated from each cavity and sent for culture. FINDINGS: Imaging demonstrates placement of  bilateral 10 French abdominal abscess drains. IMPRESSION: Successful abdominal abscess drain x2. Electronically Signed   By: Marybelle Killings M.D.   On: 04/30/2017 17:58   Ir Radiologist Eval & Mgmt  Result Date: 05/06/2017 Please refer to notes tab for details about  interventional procedure. (Op Note)   Labs:  CBC: Recent Labs    04/29/17 1654 05/01/17 1040 05/02/17 0509 05/07/17 1257  WBC 8.3 5.8 5.6 10.6*  HGB 13.5 12.0 12.8 13.6  HCT 41.4 37.4 39.7 41.2  PLT 248 187 188 203    COAGS: Recent Labs    04/29/17 1654 05/07/17 1257  INR 0.94 0.95    BMP: Recent Labs    04/16/17 0411 04/29/17 1654 05/01/17 1040 05/02/17 0509  NA 141 137 142 141  K 4.2 4.1 4.0 3.9  CL 105 100* 103 103  CO2 30 28 32 31  GLUCOSE 136* 351* 50* 83  BUN 14 15 16 17   CALCIUM 8.4* 9.2 8.7* 8.7*  CREATININE 0.82 0.86 0.89 0.77  GFRNONAA >60 >60 >60 >60  GFRAA >60 >60 >60 >60    LIVER FUNCTION TESTS: Recent Labs    10/29/16 1105 04/08/17 1225 04/29/17 1654  BILITOT 0.4 0.2* 0.9  AST 20 39 65*  ALT 19 45 89*  ALKPHOS 88 90 95  PROT 6.2 6.4* 6.8  ALBUMIN 3.6 3.1* 3.3*    TUMOR MARKERS: No results for input(s): AFPTM, CEA, CA199, CHROMGRNA in the last 8760 hours.  Assessment:  S/P Robotic TAH/LSO, lymph node dissection with development of fluid collections.  CT scan shows there is a more medial anterior fluid collection on the right that is larger.  Dr. Barbie Banner also saw the patient today.  She will need to have a new drain placed in the larger collection.  For now, the left one will remain in place. She does not need to flush this one.  The right one she will flush with NS (I instructed her how to do this properly).  We will call her with date and time for the new drain.   Electronically Signed: Murrell Redden PA-C 05/18/2017, 1:15 PM    Please refer to Dr. Barbie Banner' attestation of this note for management and plan.

## 2017-05-18 NOTE — Progress Notes (Signed)
Consult Note: Gyn-Onc  Consult was requested by Dr. Philis Pique for the evaluation of Katie Palmer 67 y.o. female  CC:  Chief Complaint  Patient presents with  . Endometrial cancer ([place])  . Endometrial adenocarcinoma [facility])    Assessment/Plan:  Katie Palmer  is a 67 y.o.  year old with stage IB carcinosarcoma of the uterus, s/p staging surgery on 2/67/12 complicated by postop bilateral lymphocysts.  High risk factors for recurrence. Recommendation is for adjuvant chemotherapy with carboplatin and paclitaxel x 6 cycles to reduce distant relapse with vaginal brachytherapy to reduce risk for local recurrence in accordance with NCCN guidelines.  I discussed this with the patient. I discussed the role of adjuvant therapy. I discussed prognosis and risk for recurrence.  She has some trepidation about chemotherapy and is having second thoughts. She is wanting to delay until she is feeling better from her lymphocyst treatment.  Reduced her dose of gabapentin per her request. After completing adjuvant therapy I recommend she follow-up at 3 monthly intervals for symptom review, physical examination and pelvic examination. Pap smear is not recommended in routine endometrial cancer surveillance. After 2 years we will space these visits to every 6 months, and then annually if recurrence has not developed within 5 years. All questions were answered. grade 3 endometrial cancer and type I diabetes mellitus.  A detailed discussion was held with the patient and her family with regard to to her endometrial cancer diagnosis. We discussed the standard management options for uterine cancer which includes surgery followed possibly by adjuvant therapy depending on the results of surgery. The options for surgical management include a hysterectomy and removal of the tubes and ovaries possibly with removal of pelvic and para-aortic lymph nodes.If feasible, a minimally invasive approach including a robotic  hysterectomy or laparoscopic hysterectomy have benefits including shorter hospital stay, recovery time and better wound healing than with open surgery. The patient has been counseled about these surgical options and the risks of surgery in general including infection, bleeding, damage to surrounding structures (including bowel, bladder, ureters, nerves or vessels), and the postoperative risks of PE/ DVT, and lymphedema. I extensively reviewed the additional risks of robotic hysterectomy including possible need for conversion to open laparotomy.  I discussed positioning during surgery of trendelenberg and risks of minor facial swelling and care we take in preoperative positioning.  She understands that her diabetes and obesity place her at increased risks for these complications. After counseling and consideration of her options, she desires to proceed with robotic assisted total hysterectomy with bilateral sapingo-oophorectomy and SLN biopsy.   She will be seen by anesthesia for preoperative clearance and discussion of postoperative pain management.  She was given the opportunity to ask questions, which were answered to her satisfaction, and she is agreement with the above mentioned plan of care.   HPI: Katie Palmer is a 67 year old P1 who is seen in consultation at the request of Dr Philis Pique for grade 3 endometrial cancer.  The patient reports postmenopausal bleeding since December, 2018. Pap smear showed AGUS. Endometrial biopsy on 03/17/17 showed grade 3 endometrial adenocarcinoma.   The patient is morbidly obese and has type I diabetes mellitus for which she uses an insulin pump. Her HbA1c is 8.2. She acknowledges that poor dietary control and decreased activity are the reasons for the elevated HbA1c. She has had a laparotomy for an ectopic pregnancy. She has a history of 1 SVD. She has no family history of breast/ovarian/colon cancers.   Interval Hx:  On 04/15/17 she underwent robotic assisted total  hysterectomy, BSO, SLN biopsy and para-aortic lymphadenectomy for high grade uterine cancer. Surgery was uncomplicated. Final pathology revealed a stage IB carcinosarcoma of the uterus with a 4.2cm tumor with 0.7cm of 1.3cm myometrial invasion (outer half), no LVSI, negative pelvic sentinel and para-aortic nodes.  A vaginal biopsy was taken from the upper left (proximal) vagina which was benign on frozen but on definitive pathology revealed atypical suspicious for malignancy but not diagnostic (of note, this nodule was completely resected at surgery).  In accordance with NCCN guidelines, she was recommended adjuvant carb/tax chemotherapy and vaginal brachytherapy to  faciliate attempts at local and distant control of this very high risk tumor.   Postoperatively she did well initially but then developed severe left lower extremity pain. As part of work up for this pain she underwent CT imaging on 04/29/17 which revealed bilateral retroperitoneal lymphocysts (left greater than right) measuring up to 6cm. She was admitted for pain control and percutaneous drains were placed within these collections. She was started on oral morphine and gabapentin for pain control.  She was discharged to home after repositioning in hospital of the drains.   CT scan on 05/18/17 showed: Right retroperitoneal fluid collections are improved. The right drain has been repositioned into the more anterior fluid collection which is smaller than on the prior study. The lateral left retroperitoneal fluid collection has resolved after drainage. The more medial and anterior fluid collection has increased in size. The drain is not coiled in this more medial fluid collection.   She has stopped draining from the left (so capped). Her pain remains, though she feels it is somewhat improved. She desires a decreased dose of gabapentin. She expressed a sense of huge frustration and feeling "lost" that she doesn't understand what is happening with  her treatment .She is not sure if she wants to receive therapy. She is worried that it is hopeless regardless.   Current Meds:  Outpatient Encounter Medications as of 05/18/2017  Medication Sig  . Acetaminophen (TYLENOL PO) Take 1 tablet by mouth every 6 (six) hours as needed (pain/headache).  . ALPRAZolam (XANAX) 1 MG tablet TAKE 1/2 TO 1 TABLET BY MOUTH AT BEDTIME AS NEEDED FOR ANXIETY (Patient taking differently: TAKE 1/2 TO 1 TABLET (0.5 - 1 MG)  BY MOUTH AT BEDTIME AS NEEDED FOR ANXIETY)  . DULoxetine (CYMBALTA) 30 MG capsule Take 90 mg by mouth at bedtime.   Marland Kitchen ezetimibe (ZETIA) 10 MG tablet Take 10 mg by mouth at bedtime.   . gabapentin (NEURONTIN) 100 MG capsule Take 200 mg two times a day  . glucagon (GLUCAGON EMERGENCY) 1 MG injection Inject 1 mg into the muscle once as needed (low blood sugar).  . hydrocortisone cream 1 % Apply topically 2 (two) times daily. (Patient taking differently: Apply 1 application topically 2 (two) times daily as needed for itching. )  . Insulin Human (INSULIN PUMP) SOLN Inject into the skin continuous. Use with Novolog  . losartan-hydrochlorothiazide (HYZAAR) 50-12.5 MG tablet TAKE 1 TABLET BY MOUTH EVERY DAY  . morphine (Katie CONTIN) 15 MG 12 hr tablet Take 1 tablet (15 mg total) by mouth every 12 (twelve) hours. (Patient taking differently: Take 15 mg by mouth every 12 (twelve) hours as needed for pain. )  . morphine (MSIR) 15 MG tablet Take 1 tablet (15 mg total) by mouth every 4 (four) hours as needed for severe pain.  Marland Kitchen senna (SENOKOT) 8.6 MG TABS tablet Take 1 tablet (  8.6 mg total) by mouth at bedtime.  . SUMAtriptan (IMITREX) 100 MG tablet TAKE 1 TABLET BY MOUTH AS NEEDED FOR MIGRAINES. MAY REPEAT IN 2 HOURS IF NEEDED (Patient taking differently: Take 100 mg by mouth every 2 (two) hours as needed for migraine. )  . traMADol (ULTRAM) 50 MG tablet TAKE 2 TABLETS BY MOUTH EVERY 4 HOURS AS NEEDED (Patient taking differently: TAKE 1 TABLET (50 MG) BY MOUTH EVERY  4 HOURS AS NEEDED FOR PAIN)  . traZODone (DESYREL) 100 MG tablet Take 100 mg by mouth at bedtime.   . Vitamin D, Ergocalciferol, (DRISDOL) 50000 units CAPS capsule Take 50,000 Units by mouth every Sunday.  . [DISCONTINUED] gabapentin (NEURONTIN) 100 MG capsule Take 2 capsules (200 mg total) by mouth 2 (two) times daily. Take 600 mg three times a day  . [DISCONTINUED] gabapentin (NEURONTIN) 300 MG capsule Take 2 capsules (600 mg total) by mouth 3 (three) times daily. Take 600 mg three times a day (Patient taking differently: Take 300-600 mg by mouth See admin instructions. Take one capsule (300 mg) by mouth every morning and afternoon, take two capsules (600 mg) at bedtime)  . gabapentin (NEURONTIN) 400 MG capsule Take 1 capsule (400 mg total) by mouth at bedtime.   No facility-administered encounter medications on file as of 05/18/2017.     Allergy:  Allergies  Allergen Reactions  . Armodafinil Other (See Comments)    Flu-like symptoms  . Invokana [Canagliflozin] Other (See Comments)    "shut down pancreas"   . Lasix [Furosemide] Diarrhea    Severe diarrhea  . Adhesive [Tape] Itching and Rash    Pt has not tried paper tape    Social Hx:   Social History   Socioeconomic History  . Marital status: Divorced    Spouse name: Not on file  . Number of children: 1  . Years of education: Not on file  . Highest education level: Not on file  Occupational History  . 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 Financial resource strain: Not on file  . Food insecurity:    Worry: Not on file    Inability: Not on file  . Transportation needs:    Medical: Not on file    Non-medical: Not on file  Tobacco Use  . Smoking status: Never Smoker  . Smokeless tobacco: Never Used  Substance and Sexual Activity  . Alcohol use: No    Alcohol/week: 0.0 oz  . Drug use: No  . Sexual activity: Not Currently    Birth control/protection: Post-menopausal  Lifestyle  . Physical activity:     Days per week: Not on file    Minutes per session: Not on file  . Stress: Not on file  Relationships  . Social connections:    Talks on phone: Not on file    Gets together: Not on file    Attends religious service: Not on file    Active member of club or organization: Not on file    Attends meetings of clubs or organizations: Not on file    Relationship status: Not on file  . Intimate partner violence:    Fear of current or ex partner: Not on file    Emotionally abused: Not on file    Physically abused: Not on file    Forced sexual activity: Not on file  Other Topics Concern  . Not on file  Social History Narrative   Divorced.  Son lives with her.  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    Past Surgical Hx:  Past Surgical History:  Procedure Laterality Date  . CARDIOVASCULAR STRESS TEST  07/26/2015   normal myocardial perfusion study w/ no ischemia/  normal LV function and wall motion , ef 71%  . COLONOSCOPY WITH PROPOFOL  last one 10/ 2017  . HYSTEROSCOPY W/D&C N/A 12/25/2015   Procedure: DILATATION AND CURETTAGE /HYSTEROSCOPY;  Surgeon: Bobbye Charleston, MD;  Location: [facility];  Service: Gynecology;  Laterality: N/A;  . IR RADIOLOGIST EVAL & MGMT  05/06/2017  . IR RADIOLOGIST EVAL & MGMT  05/18/2017  . LAPAROSCOPIC LYSIS OF ADHESIONS N/A 12/25/2015   Procedure: LAPAROSCOPIC LYSIS OF ADHESIONS;  Surgeon: Bobbye Charleston, MD;  Location: [facility];  Service: Gynecology;  Laterality: N/A;  . LAPAROSCOPIC UNILATERAL SALPINGO OOPHERECTOMY Right 12/25/2015   Procedure: LAPAROSCOPIC UNILATERAL SALPINGO OOPHORECTOMY;  Surgeon: Bobbye Charleston, MD;  Location: [place];  Service: Gynecology;  Laterality: Right;  . LAPAROSCOPY N/A 12/25/2015   Procedure: LAPAROSCOPY OPERATIVE;  Surgeon: Bobbye Charleston, MD;  Location: [facility];  Service: Gynecology;  Laterality: N/A;  . LYMPH NODE BIOPSY N/A  04/15/2017   Procedure: SENTINEL LYMPH NODE BIOPSY, PERIAORTIC LYMPH NODES, AND VAGINAL BIOPSY;  Surgeon: Everitt Amber, MD;  Location: [facility];  Service: Gynecology;  Laterality: N/A;  . ROBOTIC ASSISTED TOTAL HYSTERECTOMY WITH BILATERAL SALPINGO OOPHERECTOMY Bilateral 04/15/2017   Procedure: XI ROBOTIC ASSISTED TOTAL LAPROSCOPIC  HYSTERECTOMY WITH LEFT SALPINGO OOPHORECTOMY;  Surgeon: Everitt Amber, MD;  Location: [facility];  Service: Gynecology;  Laterality: Bilateral;  . TONSILLECTOMY  age 51  . TRANSTHORACIC ECHOCARDIOGRAM  07/26/2015   grade 1 diastolic dysfunction , ef 60-65%/  trivial TR    Past Medical Hx:  Past Medical History:  Diagnosis Date  . Depression   . Fibromyalgia   . Headache(784.0)   . History of colon polyps   . History of diabetes with ketoacidosis   . Hx of colonic polyps   . Hypertension   . Insomnia   . Insulin pump in place   . RBBB (right bundle branch block)   . RLQ abdominal pain   . Thickened endometrium   . Type 1 diabetes mellitus with long-term current use of insulin [facility]) endocrinologist-- dr Chalmers Cater   July 2015--  pt took Invokana and caused complete pancreas shut-down , pt went from pre-diabetic to adult type 1 dm now insulin dependent  . Vertigo     Past Gynecological History:  SVD x 1 No LMP recorded. Patient is postmenopausal.  Family Hx:  Family History  Problem Relation Age of Onset  . Alcohol abuse Father        Died age 76  . Heart disease Father   . Aneurysm Mother        Died age 66  . Leukemia Brother        Died age 59  . COPD Sister   . Rheum arthritis Sister   . Heart failure Sister   . Dementia Maternal Uncle   . Colon cancer Neg Hx     Review of Systems:  Constitutional  Feels anxious  ENT Normal appearing ears and nares bilaterally Skin/Breast  No rash, sores, jaundice, itching, dryness Cardiovascular  No chest pain, shortness of breath, or edema  Pulmonary  No cough or wheeze.  Gastro Intestinal  No nausea,  vomitting, or diarrhoea. No bright red blood per rectum, + back pain, nochange in bowel movement, or  constipation.  Genito Urinary  No frequency, urgency, dysuria, no postmenopausal bleeding Musculo Skeletal  No myalgia, arthralgia, joint swelling or pain  Neurologic  + neuropathy right foot, + left leg pain Psychology  No depression, anxiety, insomnia.   Vitals:  Blood pressure 125/76, pulse 92, temperature 98.7 F (37.1 C), temperature source Oral, resp. rate 16, height 5\' 5"  (1.651 m), weight 218 lb 8 oz (99.1 kg), SpO2 97 %.  Physical Exam: WD in NAD Neck  Supple NROM, without any enlargements.  Lymph Node Survey No cervical supraclavicular or inguinal adenopathy Cardiovascular  Pulse normal rate, regularity and rhythm. S1 and S2 normal.  Lungs  Clear to auscultation bilateraly, without wheezes/crackles/rhonchi. Good air movement.  Skin  No rash/lesions/breakdown  Psychiatry  Alert and oriented to person, place, and time  Abdomen  Normoactive bowel sounds, abdomen soft, non-tender and obese without evidence of hernia. Insulin pump on abdomen. Well healed incisions without signs of infection. Back No CVA tenderness.nontender to palpation - no lesions or masses. Genito Urinary  Vulva/vagina: Normal external female genitalia.   No lesions. No discharge or bleeding. Surgically absent uterus and cervix. Vagina grossly normal, no visible lesions. Well healed cuff with no inflammation or infection. Rectal  deferred Extremities  No bilateral cyanosis, clubbing or edema.   30 minutes of direct face to face counseling time was spent with the patient. This included discussion about prognosis, therapy recommendations and postoperative side effects and are beyond the scope of routine postoperative care.   Thereasa Solo, MD  05/18/2017, 7:09 PM

## 2017-05-18 NOTE — Patient Instructions (Signed)
We will postpone your chemotherapy for 1 week (and further if necessary). We will move up your chemotherapy teaching if possible.  Dr Denman George has changed your neurontin dose to 200mg  in the morning and afternoon, and 400mg  before bed.

## 2017-05-19 ENCOUNTER — Ambulatory Visit: Payer: Medicare Other

## 2017-05-19 ENCOUNTER — Telehealth: Payer: Self-pay

## 2017-05-19 ENCOUNTER — Telehealth: Payer: Self-pay | Admitting: Oncology

## 2017-05-19 ENCOUNTER — Ambulatory Visit
Admission: RE | Admit: 2017-05-19 | Discharge: 2017-05-19 | Disposition: A | Discharge status: Home / Self Care | Payer: Medicare Other | Source: Ambulatory Visit | Attending: Radiation Oncology | Admitting: Radiation Oncology

## 2017-05-19 ENCOUNTER — Telehealth: Payer: Self-pay | Admitting: *Deleted

## 2017-05-19 NOTE — Telephone Encounter (Signed)
Attempted to contact the patient regarding her appts. Left a message to call the office back.

## 2017-05-19 NOTE — Telephone Encounter (Signed)
Left a message for patient requesting a return call.

## 2017-05-19 NOTE — Telephone Encounter (Signed)
Left VM on patient's phone to see if she will be coming for consult with Dr. Sondra Come today.

## 2017-05-20 ENCOUNTER — Encounter (HOSPITAL_COMMUNITY): Payer: Self-pay | Admitting: Radiology

## 2017-05-20 ENCOUNTER — Other Ambulatory Visit: Payer: Self-pay | Admitting: Student

## 2017-05-20 ENCOUNTER — Other Ambulatory Visit: Payer: Medicare Other

## 2017-05-20 ENCOUNTER — Other Ambulatory Visit: Payer: Self-pay | Admitting: Radiology

## 2017-05-20 ENCOUNTER — Telehealth: Payer: Self-pay | Admitting: *Deleted

## 2017-05-20 MED ORDER — DEXTROSE 5 % IV SOLN: 2.0000 g | INTRAVENOUS | Status: AC

## 2017-05-20 NOTE — Telephone Encounter (Signed)
Patient called and was given all her new appts for the next couple weeks

## 2017-05-21 ENCOUNTER — Ambulatory Visit (HOSPITAL_COMMUNITY)
Admission: RE | Admit: 2017-05-21 | Discharge: 2017-05-21 | Disposition: A | Discharge status: Home / Self Care | Payer: Medicare Other | Source: Ambulatory Visit | Attending: Interventional Radiology | Admitting: Interventional Radiology

## 2017-05-21 ENCOUNTER — Encounter (HOSPITAL_COMMUNITY): Payer: Self-pay

## 2017-05-21 DIAGNOSIS — I4510 Unspecified right bundle-branch block: Secondary | ICD-10-CM | POA: Insufficient documentation

## 2017-05-21 DIAGNOSIS — Z806 Family history of leukemia: Secondary | ICD-10-CM | POA: Diagnosis not present

## 2017-05-21 DIAGNOSIS — Z86010 Personal history of colonic polyps: Secondary | ICD-10-CM | POA: Insufficient documentation

## 2017-05-21 DIAGNOSIS — Z888 Allergy status to other drugs, medicaments and biological substances status: Secondary | ICD-10-CM | POA: Insufficient documentation

## 2017-05-21 DIAGNOSIS — Z79899 Other long term (current) drug therapy: Secondary | ICD-10-CM | POA: Diagnosis not present

## 2017-05-21 DIAGNOSIS — Z8249 Family history of ischemic heart disease and other diseases of the circulatory system: Secondary | ICD-10-CM | POA: Diagnosis not present

## 2017-05-21 DIAGNOSIS — Z90721 Acquired absence of ovaries, unilateral: Secondary | ICD-10-CM | POA: Diagnosis not present

## 2017-05-21 DIAGNOSIS — M797 Fibromyalgia: Secondary | ICD-10-CM | POA: Insufficient documentation

## 2017-05-21 DIAGNOSIS — Z811 Family history of alcohol abuse and dependence: Secondary | ICD-10-CM | POA: Insufficient documentation

## 2017-05-21 DIAGNOSIS — L02211 Cutaneous abscess of abdominal wall: Secondary | ICD-10-CM | POA: Diagnosis not present

## 2017-05-21 DIAGNOSIS — E1010 Type 1 diabetes mellitus with ketoacidosis without coma: Secondary | ICD-10-CM | POA: Insufficient documentation

## 2017-05-21 DIAGNOSIS — R9389 Abnormal findings on diagnostic imaging of other specified body structures: Secondary | ICD-10-CM | POA: Diagnosis not present

## 2017-05-21 DIAGNOSIS — L0291 Cutaneous abscess, unspecified: Secondary | ICD-10-CM | POA: Diagnosis present

## 2017-05-21 DIAGNOSIS — Z9889 Other specified postprocedural states: Secondary | ICD-10-CM | POA: Diagnosis not present

## 2017-05-21 DIAGNOSIS — Z90710 Acquired absence of both cervix and uterus: Secondary | ICD-10-CM | POA: Diagnosis not present

## 2017-05-21 DIAGNOSIS — Z794 Long term (current) use of insulin: Secondary | ICD-10-CM | POA: Diagnosis not present

## 2017-05-21 DIAGNOSIS — Z8261 Family history of arthritis: Secondary | ICD-10-CM | POA: Diagnosis not present

## 2017-05-21 DIAGNOSIS — I10 Essential (primary) hypertension: Secondary | ICD-10-CM | POA: Diagnosis not present

## 2017-05-21 DIAGNOSIS — N739 Female pelvic inflammatory disease, unspecified: Secondary | ICD-10-CM | POA: Insufficient documentation

## 2017-05-21 DIAGNOSIS — G4700 Insomnia, unspecified: Secondary | ICD-10-CM | POA: Insufficient documentation

## 2017-05-21 LAB — CBC
HEMATOCRIT: 38.8 % (ref 36.0–46.0)
Hemoglobin: 12.8 g/dL (ref 12.0–15.0)
MCH: 28.8 pg (ref 26.0–34.0)
MCHC: 33 g/dL (ref 30.0–36.0)
MCV: 87.2 fL (ref 78.0–100.0)
PLATELETS: 368 10*3/uL (ref 150–400)
RBC: 4.45 MIL/uL (ref 3.87–5.11)
RDW: 13.9 % (ref 11.5–15.5)
WBC: 10.3 10*3/uL (ref 4.0–10.5)

## 2017-05-21 LAB — BASIC METABOLIC PANEL
Anion gap: 14 (ref 5–15)
BUN: 10 mg/dL (ref 6–20)
CHLORIDE: 96 mmol/L — AB (ref 101–111)
CO2: 25 mmol/L (ref 22–32)
CREATININE: 0.94 mg/dL (ref 0.44–1.00)
Calcium: 8.9 mg/dL (ref 8.9–10.3)
GFR calc non Af Amer: >60 mL/min (ref >60)
Glucose, Bld: 224 mg/dL — ABNORMAL HIGH (ref 65–99)
POTASSIUM: 4.1 mmol/L (ref 3.5–5.1)
Sodium: 135 mmol/L (ref 135–145)

## 2017-05-21 LAB — CREATININE, FLUID (PLEURAL, PERITONEAL, JP DRAINAGE): Creat, Fluid: 0.9 mg/dL

## 2017-05-21 LAB — GLUCOSE, CAPILLARY: GLUCOSE-CAPILLARY: 179 mg/dL — AB (ref 65–99)

## 2017-05-21 LAB — PROTIME-INR
INR: 1.01
Prothrombin Time: 13.2 seconds (ref 11.4–15.2)

## 2017-05-21 MED ORDER — HYDROCODONE-ACETAMINOPHEN 5-325 MG PO TABS
1.0000 | ORAL_TABLET | ORAL | Status: DC | PRN
  Administered 2017-05-21: 1 via ORAL

## 2017-05-21 MED ORDER — FENTANYL CITRATE (PF) 100 MCG/2ML IJ SOLN
INTRAMUSCULAR | Status: AC
  Filled 2017-05-21: qty 4

## 2017-05-21 MED ORDER — SODIUM CHLORIDE 0.9 % IV SOLN
INTRAVENOUS | Status: DC
  Administered 2017-05-21: 10:00:00 via INTRAVENOUS

## 2017-05-21 MED ORDER — MIDAZOLAM HCL 2 MG/2ML IJ SOLN
INTRAMUSCULAR | Status: AC | PRN
  Administered 2017-05-21 (×2): 1 mg via INTRAVENOUS
  Administered 2017-05-21: 0.5 mg via INTRAVENOUS
  Administered 2017-05-21: 1 mg via INTRAVENOUS

## 2017-05-21 MED ORDER — HYDROCODONE-ACETAMINOPHEN 5-325 MG PO TABS
ORAL_TABLET | ORAL | Status: AC
  Filled 2017-05-21: qty 1

## 2017-05-21 MED ORDER — LIDOCAINE HCL 1 % IJ SOLN
INTRAMUSCULAR | Status: AC
  Filled 2017-05-21: qty 20

## 2017-05-21 MED ORDER — FENTANYL CITRATE (PF) 100 MCG/2ML IJ SOLN
INTRAMUSCULAR | Status: AC | PRN
  Administered 2017-05-21: 25 ug via INTRAVENOUS
  Administered 2017-05-21 (×3): 50 ug via INTRAVENOUS

## 2017-05-21 MED ORDER — CEFAZOLIN SODIUM-DEXTROSE 2-4 GM/100ML-% IV SOLN
INTRAVENOUS | Status: AC
  Administered 2017-05-21: 2000 mg
  Filled 2017-05-21: qty 100

## 2017-05-21 MED ORDER — SODIUM CHLORIDE 0.9% FLUSH: 5.0000 mL | Freq: Three times a day (TID) | INTRAVENOUS | Status: DC

## 2017-05-21 MED ORDER — MIDAZOLAM HCL 2 MG/2ML IJ SOLN
INTRAMUSCULAR | Status: AC
  Filled 2017-05-21: qty 4

## 2017-05-21 NOTE — Progress Notes (Signed)
Jannifer Franklin, PA in to see pt.  Instructed pt to flush jp drain and gave thorough instructions.  Pt and son voices understanding of instructions.

## 2017-05-21 NOTE — Sedation Documentation (Signed)
Patient is resting comfortably. 

## 2017-05-21 NOTE — H&P (Signed)
Chief Complaint: Patient was seen in consultation today for Abdominal fluid collection drain replace/exchange at the request of Dr Dorann Ou  Supervising Physician: Arne Cleveland  Patient Status: [facility] - Out-pt  History of Present Illness: Katie Palmer is a 67 y.o. 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   Left and right abdominal abscess drains placed 04/30/17  Exchange of left drain and new placement of right performed 4/12 secondary dysfunction an dislodgement  4/23 CT shows: Right retroperitoneal fluid collections are improved. The right drain has been repositioned into the more anterior fluid collection which is smaller than on the prior study. The lateral left retroperitoneal fluid collection has resolved after drainage. The more medial and anterior fluid collection has increased in size. The drain is not coiled in this more medial fluid collection.  Pain still diffuse abdomen Pt states left still is capped; right drain not draining anything in days She has been asked to return today for Left drain replacement and right drain exchange according to notes in chart  She was seen in Clinic 4/23 by Dr Barbie Banner and Maximiano Coss [facility]: CT scan shows there is a more medial anterior fluid collection on the right that is larger. Dr. Barbie Banner also saw the patient today. She will need to have a new drain placed in the larger collection. For now, the left one will remain in place. She does not need to flush this one. The right one she will flush with NS (I instructed her how to do this properly).    Past Medical History:  Diagnosis Date  . Depression   . Fibromyalgia   . Headache(784.0)   . History of colon polyps   . History of diabetes with ketoacidosis   . Hx of colonic polyps   . Hypertension    . Insomnia   . Insulin pump in place   . RBBB (right bundle branch block)   . RLQ abdominal pain   . Thickened endometrium   . Type 1 diabetes mellitus with long-term current use of insulin [facility]) endocrinologist-- dr Chalmers Cater   July 2015--  pt took Invokana and caused complete pancreas shut-down , pt went from pre-diabetic to adult type 1 dm now insulin dependent  . Vertigo     Past Surgical History:  Procedure Laterality Date  . CARDIOVASCULAR STRESS TEST  07/26/2015   normal myocardial perfusion study w/ no ischemia/  normal LV function and wall motion , ef 71%  . COLONOSCOPY WITH PROPOFOL  last one 10/ 2017  . HYSTEROSCOPY W/D&C N/A 12/25/2015   Procedure: DILATATION AND CURETTAGE /HYSTEROSCOPY;  Surgeon: Bobbye Charleston, MD;  Location: [facility];  Service: Gynecology;  Laterality: N/A;  . IR RADIOLOGIST EVAL & MGMT  05/06/2017  . IR RADIOLOGIST EVAL & MGMT  05/18/2017  . IR RADIOLOGIST EVAL & MGMT  05/13/2017  . LAPAROSCOPIC LYSIS OF ADHESIONS N/A 12/25/2015   Procedure: LAPAROSCOPIC LYSIS OF ADHESIONS;  Surgeon: Bobbye Charleston, MD;  Location: [place];  Service: Gynecology;  Laterality: N/A;  . LAPAROSCOPIC UNILATERAL SALPINGO OOPHERECTOMY Right 12/25/2015   Procedure: LAPAROSCOPIC UNILATERAL SALPINGO OOPHORECTOMY;  Surgeon: Bobbye Charleston, MD;  Location: [place];  Service: Gynecology;  Laterality: Right;  . LAPAROSCOPY N/A 12/25/2015   Procedure: LAPAROSCOPY OPERATIVE;  Surgeon: Bobbye Charleston, MD;  Location: [facility];  Service: Gynecology;  Laterality:  N/A;  . LYMPH NODE BIOPSY N/A 04/15/2017   Procedure: SENTINEL LYMPH NODE BIOPSY, PERIAORTIC LYMPH NODES, AND VAGINAL BIOPSY;  Surgeon: Everitt Amber, MD;  Location: [facility];  Service: Gynecology;  Laterality: N/A;  . ROBOTIC ASSISTED TOTAL HYSTERECTOMY WITH BILATERAL SALPINGO OOPHERECTOMY Bilateral 04/15/2017   Procedure: XI ROBOTIC ASSISTED TOTAL LAPROSCOPIC   HYSTERECTOMY WITH LEFT SALPINGO OOPHORECTOMY;  Surgeon: Everitt Amber, MD;  Location: [facility];  Service: Gynecology;  Laterality: Bilateral;  . TONSILLECTOMY  age 1  . TRANSTHORACIC ECHOCARDIOGRAM  07/26/2015   grade 1 diastolic dysfunction , ef 60-65%/  trivial TR    Allergies: Armodafinil; Invokana [canagliflozin]; Lasix [furosemide]; and Adhesive [tape]  Medications: Prior to Admission medications   Medication Sig Start Date End Date Taking? Authorizing Provider  DULoxetine (CYMBALTA) 30 MG capsule Take 90 mg by mouth at bedtime.  04/17/15  Yes [provider]  ezetimibe (ZETIA) 10 MG tablet Take 10 mg by mouth at bedtime.  09/16/16  Yes [provider]  gabapentin (NEURONTIN) 100 MG capsule Take 200 mg two times a day 05/18/17  Yes Cross, [name] D, NP  gabapentin (NEURONTIN) 400 MG capsule Take 1 capsule (400 mg total) by mouth at bedtime. 05/18/17  Yes Everitt Amber, MD  Insulin Human (INSULIN PUMP) SOLN Inject into the skin continuous. Use with Novolog   Yes [provider]  losartan-hydrochlorothiazide (HYZAAR) 50-12.5 MG tablet TAKE 1 TABLET BY MOUTH EVERY DAY 04/12/17  Yes Laurey Morale, MD  morphine (MS CONTIN) 15 MG 12 hr tablet Take 1 tablet (15 mg total) by mouth every 12 (twelve) hours. Patient taking differently: Take 15 mg by mouth every 12 (twelve) hours as needed for pain.  05/02/17 06/01/17 Yes Everitt Amber, MD  morphine (MSIR) 15 MG tablet Take 1 tablet (15 mg total) by mouth every 4 (four) hours as needed for severe pain. 05/14/17 06/13/17 Yes Cross, [name] D, NP  traMADol (ULTRAM) 50 MG tablet TAKE 2 TABLETS BY MOUTH EVERY 4 HOURS AS NEEDED Patient taking differently: TAKE 1 TABLET (50 MG) BY MOUTH EVERY 4 HOURS AS NEEDED FOR PAIN 05/06/17  Yes Laurey Morale, MD  traZODone (DESYREL) 100 MG tablet Take 100 mg by mouth at bedtime.  04/15/15  Yes [provider]  Vitamin D, Ergocalciferol, (DRISDOL) 50000 units CAPS capsule Take 50,000 Units by mouth  every Sunday.   Yes [provider]  Acetaminophen (TYLENOL PO) Take 1 tablet by mouth every 6 (six) hours as needed (pain/headache).    [provider]  ALPRAZolam (XANAX) 1 MG tablet TAKE 1/2 TO 1 TABLET BY MOUTH AT BEDTIME AS NEEDED FOR ANXIETY Patient taking differently: TAKE 1/2 TO 1 TABLET (0.5 - 1 MG)  BY MOUTH AT BEDTIME AS NEEDED FOR ANXIETY 05/05/16   Laurey Morale, MD  glucagon (GLUCAGON EMERGENCY) 1 MG injection Inject 1 mg into the muscle once as needed (low blood sugar).    [provider]  hydrocortisone cream 1 % Apply topically 2 (two) times daily. Patient taking differently: Apply 1 application topically 2 (two) times daily as needed for itching.  05/02/17   Everitt Amber, MD  senna (SENOKOT) 8.6 MG TABS tablet Take 1 tablet (8.6 mg total) by mouth at bedtime. 05/02/17   Everitt Amber, MD  SUMAtriptan (IMITREX) 100 MG tablet TAKE 1 TABLET BY MOUTH AS NEEDED FOR MIGRAINES. MAY REPEAT IN 2 HOURS IF NEEDED Patient taking differently: Take 100 mg by mouth every 2 (two) hours as needed for migraine.  02/05/17  Laurey Morale, MD     Family History  Problem Relation Age of Onset  . Alcohol abuse Father        Died age 30  . Heart disease Father   . Aneurysm Mother        Died age 38  . Leukemia Brother        Died age 85  . COPD Sister   . Rheum arthritis Sister   . Heart failure Sister   . Dementia Maternal Uncle   . Colon cancer Neg Hx     Social History   Socioeconomic History  . Marital status: Divorced    Spouse name: Not on file  . Number of children: 1  . Years of education: Not on file  . Highest education level: Not on file  Occupational History  . 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 Financial resource strain: Not on file  . Food insecurity:    Worry: Not on file    Inability: Not on file  . Transportation needs:    Medical: Not on file    Non-medical: Not on file  Tobacco Use  . Smoking status: Never Smoker  .  Smokeless tobacco: Never Used  Substance and Sexual Activity  . Alcohol use: No    Alcohol/week: 0.0 oz  . Drug use: No  . Sexual activity: Not Currently    Birth control/protection: Post-menopausal  Lifestyle  . Physical activity:    Days per week: Not on file    Minutes per session: Not on file  . Stress: Not on file  Relationships  . Social connections:    Talks on phone: Not on file    Gets together: Not on file    Attends religious service: Not on file    Active member of club or organization: Not on file    Attends meetings of clubs or organizations: Not on file    Relationship status: Not on file  Other Topics Concern  . Not on file  Social History Narrative   Divorced.  Son lives with her.  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    Review of Systems: A 12 point ROS discussed and pertinent positives are indicated in the HPI above.  All other systems are negative.  Review of Systems  Constitutional: Positive for activity change, appetite change and fatigue. Negative for fever.  Cardiovascular: Negative for chest pain.  Gastrointestinal: Positive for abdominal distention and abdominal pain.  Neurological: Negative for weakness.  Psychiatric/Behavioral: Negative for behavioral problems and confusion.    Vital Signs: BP 129/85   Pulse 86   Temp 97.9 F (36.6 C)   Resp 20   Ht 5\' 5"  (1.651 m)   Wt 215 lb (97.5 kg)   SpO2 99%   BMI 35.78 kg/m   Physical Exam  Constitutional: She is oriented to person, place, and time.  Cardiovascular: Normal rate and regular rhythm.  Pulmonary/Chest: Effort normal and breath sounds normal.  Abdominal: Soft. She exhibits distension. There is tenderness.  Musculoskeletal: Normal range of motion.  Neurological: She is alert and oriented to person, place, and time.  Skin: Skin is warm and dry.  Psychiatric: She has a normal mood and affect. Her behavior is normal. Judgment and thought content normal.    Nursing note and vitals reviewed.   Imaging: Ct Abdomen Pelvis W Contrast  Result Date: 05/18/2017 CLINICAL DATA:  Follow-up retroperitoneal drainage EXAM: CT ABDOMEN AND PELVIS WITH  CONTRAST TECHNIQUE: Multidetector CT imaging of the abdomen and pelvis was performed using the standard protocol following bolus administration of intravenous contrast. CONTRAST:  175mL ISOVUE-300 IOPAMIDOL (ISOVUE-300) INJECTION 61% COMPARISON:  05/06/2017 FINDINGS: Lower chest: Dependent atelectasis Hepatobiliary: Nonspecific mild gallbladder wall thickening. No liver lesion. Pancreas: Unremarkable Spleen: Unremarkable Adrenals/Urinary Tract: Stable mild hydronephrosis. Adrenal glands are unremarkable. Bladder decompressed. Stomach/Bowel: Moderate stool burden throughout the colon. No evidence of small-bowel obstruction. Normal appendix. Stomach is distended with enteric contents. Vascular/Lymphatic: No evidence of aortic aneurysm. Reproductive: Post hysterectomy Other: There has been further improvement regarding the retroperitoneal fluid collections. The right drain has been repositioned into the more central retroperitoneal fluid collection. The perinephric fluid collection on the right has resolved. Today, the fluid collection layering over the right psoas muscle measures 2.3 x 3.4 cm which is improved. The drain is coiled within this fluid collection. On the left, the more lateral fluid collection has been drained and is now resolved. There is a separate more anterior fluid collection which is larger and today measures 4.4 x 4.5 cm. The drain is adjacent to this fluid collection. There is stranding surrounding the lower abdominal aorta and within the retroperitoneum likely due to inflammatory change. This has increased since the prior study. Musculoskeletal: No vertebral compression deformity. Degenerative changes. IMPRESSION: Right retroperitoneal fluid collections are improved. The right drain has been repositioned into  the more anterior fluid collection which is smaller than on the prior study. The lateral left retroperitoneal fluid collection has resolved after drainage. The more medial and anterior fluid collection has increased in size. The drain is not coiled in this more medial fluid collection. Electronically Signed   By: Marybelle Killings M.D.   On: 05/18/2017 13:12   Ct Abdomen Pelvis W Contrast  Result Date: 05/06/2017 CLINICAL DATA:  History of hysterectomy and BSO and lymph node dissection for endometrial carcinoma who presented to the emergency department on 04/29/2017 with abdominal pain and subsequent CT scan demonstrating development of bilateral retroperitoneal fluid collections concerning for postoperative seromas and/or abscesses. As such, patient underwent bilateral CT-guided percutaneous drainage catheter placement on 04/30/2017 by Dr. Barbie Banner. Patient presents today to the interventional radiology drain Clinic for drainage catheter evaluation and management. EXAM: CT ABDOMEN AND PELVIS WITH CONTRAST TECHNIQUE: Multidetector CT imaging of the abdomen and pelvis was performed using the standard protocol following bolus administration of intravenous contrast. CONTRAST:  183mL ISOVUE-300 IOPAMIDOL (ISOVUE-300) INJECTION 61% COMPARISON:  CT abdomen pelvis-04/29/2017; 03/26/2017; CT-guided bilateral retroperitoneal drainage catheter placement-04/30/2017 FINDINGS: Lower chest: Limited visualization of the lower thorax demonstrates unchanged punctate (approximately 0.4 cm) nodules within the right lower (image 4, series 4 and left lower (image 11, series 4) lobes. Minimal subsegmental atelectasis within the medial basilar aspect of the left lower lobe. No focal airspace opacities. No pleural effusion. Normal heart size.  No pericardial effusion. Hepatobiliary: Normal hepatic contour. There is a minimal amount of focal fatty infiltration adjacent to the fissure for ligamentum teres. No discrete hepatic lesions. Normal  appearance of the gallbladder given degree distention. No radiopaque gallstones. Grossly unchanged very mild centralized intrahepatic biliary duct dilatation. No extrahepatic bili duct dilatation. No ascites. Pancreas: The pancreas remains slightly atrophic. Spleen: Normal appearance of the spleen. Adrenals/Urinary Tract: There is symmetric enhancement of the bilateral kidneys. No renal stones in this postcontrast examination. No discrete renal lesions. No urinary obstruction or perinephric stranding. Normal appearance the bilateral adrenal glands. Normal appearance of the urinary bladder given underdistention. Stomach/Bowel: Moderate colonic stool burden without evidence of  enteric obstruction. The cecum is noted to be located within the right mid abdomen. Normal appearance of the terminal ileum. The appendix is not visualized, however there is no pericecal inflammatory change. No pneumoperitoneum, pneumatosis or portal venous gas. Vascular/Lymphatic: Normal caliber the abdominal aorta. The major branch vessels of the abdominal aorta appear patent on this non CTA examination. No bulky retroperitoneal, mesenteric, pelvic or inguinal lymphadenopathy. Reproductive: Post hysterectomy and bilateral oophorectomy. No free fluid the pelvic cul-de-sac. Other: Right-sided retroperitoneal percutaneous drainage catheter has been retracted is currently located outside the residual right retroperitoneal fluid collection the right-sided retroperitoneal fluid collection is grossly unchanged in size measuring approximately 10.5 x 4.8 x 9.7 cm (axial image 43, series 2, coronal image 77, series 6, previously, 9.3 x 3.5 x 9.6 cm. The right-sided retroperitoneal drainage catheter is located within the peripheral aspect of the left retroperitoneal fluid collection and the left-sided fluid collection is also grossly unchanged in size measuring approximately 9.6 x 5.4 x 13.1 cm (axial image 38, series 2, coronal image 81, series 6  previously, 9.5 x 4.8 x 14.1 cm. Musculoskeletal: No acute or aggressive osseous abnormalities. Stigmata of DISH within thoracic spine. Grade 1 anterolisthesis of L4 upon L5 measuring approximately 5 mm without associated pars defects, unchanged. Bilateral degenerative change of the lower lumbar spine. IMPRESSION: 1. Interval retraction of right-sided percutaneous drainage catheter with end now coiled and locked peripheral the grossly unchanged right-sided retroperitoneal fluid collection, presumed postoperative seroma. 2. Unchanged positioning of left-sided percutaneous drainage catheter with end coiled and locked within the peripheral aspect of the grossly unchanged left-sided retroperitoneal fluid collection, presumed postoperative seroma. PLAN: - The malpositioned right-sided retroperitoneal drainage catheter was removed at the patient's bedside without incident. - Above discussed with both Dr. Alvy Bimler and [name] and the decision was made to proceed with CT-guided replacement of the right-sided retroperitoneal percutaneous drainage catheter and exchange/up sizing of the left-sided retroperitoneal drainage catheter. This will be performed at an outpatient basis tomorrow [place] [facility] Dr. Earleen Newport. Electronically Signed   By: Sandi Mariscal M.D.   On: 05/06/2017 15:25   Ct Abdomen Pelvis W Contrast  Result Date: 04/29/2017 CLINICAL DATA:  2 weeks postop from hysterectomy, BSO, and lymph node dissection for endometrial carcinoma. Severe left pelvic and left leg pain. EXAM: CT ABDOMEN AND PELVIS WITH CONTRAST TECHNIQUE: Multidetector CT imaging of the abdomen and pelvis was performed using the standard protocol following bolus administration of intravenous contrast. CONTRAST:  196mL ISOVUE-300 IOPAMIDOL (ISOVUE-300) INJECTION 61% COMPARISON:  03/26/2017 FINDINGS: Lower Chest: Tiny sub-cm bilateral lower lobe pulmonary nodules are stable. Hepatobiliary: No hepatic masses identified. Gallbladder is  unremarkable. Pancreas:  No mass or inflammatory changes. Spleen: Within normal limits in size and appearance. Adrenals/Urinary Tract: No masses identified. No evidence of hydronephrosis. Unremarkable unopacified urinary bladder. Stomach/Bowel: No evidence of obstruction, inflammatory process or abnormal fluid collections. Vascular/Lymphatic: No pathologically enlarged lymph nodes. New multiloculated fluid collections are seen in the retroperitoneum in the left paraaortic and right pericaval spaces, with extension into the inferior perinephric spaces and common iliac chains bilaterally. Largest collection in the inferior left perinephric space measures 6.1 x 4.8 cm. These are consistent with postoperative lymphoceles. Reproductive: Prior hysterectomy. Adnexal regions are unremarkable in appearance. No fluid collections seen within hysterectomy bed or elsewhere within the pelvis. Other:  None. Musculoskeletal:  No suspicious bone lesions identified. IMPRESSION: Multiloculated bilateral retroperitoneal fluid collections, consistent with postoperative lymphoceles. No evidence of hydronephrosis or other complication. Electronically Signed   By: Jenny Reichmann  Kris Hartmann M.D.   On: 04/29/2017 14:01   Ct Image Guided Drainage By Percutaneous Catheter  Result Date: 05/07/2017 INDICATION: 67 year old female with a history of persisting seroma of the retroperitoneum. Right-sided tube was removed yesterday. Left-sided tube is dysfunctional. EXAM: CT-GUIDED PLACEMENT OF RIGHT RETROPERITONEAL FLUID COLLECTION DRAIN CT GUIDED EXCHANGE OF LEFT RETROPERITONEAL FLUID DRAIN MEDICATIONS: The patient is currently admitted to the hospital and receiving intravenous antibiotics. The antibiotics were administered within an appropriate time frame prior to the initiation of the procedure. ANESTHESIA/SEDATION: 2.0 mg IV Versed 100 mcg IV Fentanyl Moderate Sedation Time:  20 minutes The patient was continuously monitored during the procedure by the  interventional radiology nurse under my direct supervision. COMPLICATIONS: None immediate. TECHNIQUE: Informed written consent was obtained from the patient after a thorough discussion of the procedural risks, benefits and alternatives. All questions were addressed. Maximal Sterile Barrier Technique was utilized including caps, mask, sterile gowns, sterile gloves, sterile drape, hand hygiene and skin antiseptic. A timeout was performed prior to the initiation of the procedure. PROCEDURE: The bilateral flank including the indwelling left-sided drain was prepped with chlorhexidine in a sterile fashion, and a patient is position prone and CT imaging of the abdomen performed. Sterile drape was applied covering the operative field. A sterile gown and sterile gloves were used for the procedure. Local anesthesia was provided with 1% Lidocaine. Using modified Seldinger technique, a 10 [place] biliary drain was placed into the right-sided fluid collection. Modified Seldinger technique was then used to exchange the left-sided indwelling drain for a new 10 [place] biliary drain. Aspiration was performed of both fluid collection. Both drains were sutured in position and attached to bulb suction. Final image was stored. Patient tolerated the procedure well and remained hemodynamically stable throughout. No complications were encountered and no significant blood loss. Sample sent the lab from each side, for culture, triglycerides, creatinine. FINDINGS: Initial CT demonstrates residual fluid collection adjacent to the indwelling left drain. Fluid in the right-sided retroperitoneal region. Final image demonstrates decompression of bilateral retroperitoneal fluid collection with the biliary drains in position. IMPRESSION: Status post CT guided exchange of left-sided retroperitoneal drain for a new 10 [place] biliary drain, as well as CT-guided placement of a biliary drain into a right-sided persisting retroperitoneal fluid collection.  Signed, Dulcy Fanny. Earleen Newport, DO [facility] Electronically Signed   By: Corrie Mckusick D.O.   On: 05/07/2017 17:17   Ct Image Guided Drainage By Percutaneous Catheter  Result Date: 05/07/2017 INDICATION: 67 year old female with a history of persisting seroma of the retroperitoneum. Right-sided tube was removed yesterday. Left-sided tube is dysfunctional. EXAM: CT-GUIDED PLACEMENT OF RIGHT RETROPERITONEAL FLUID COLLECTION DRAIN CT GUIDED EXCHANGE OF LEFT RETROPERITONEAL FLUID DRAIN MEDICATIONS: The patient is currently admitted to the hospital and receiving intravenous antibiotics. The antibiotics were administered within an appropriate time frame prior to the initiation of the procedure. ANESTHESIA/SEDATION: 2.0 mg IV Versed 100 mcg IV Fentanyl Moderate Sedation Time:  20 minutes The patient was continuously monitored during the procedure by the interventional radiology nurse under my direct supervision. COMPLICATIONS: None immediate. TECHNIQUE: Informed written consent was obtained from the patient after a thorough discussion of the procedural risks, benefits and alternatives. All questions were addressed. Maximal Sterile Barrier Technique was utilized including caps, mask, sterile gowns, sterile gloves, sterile drape, hand hygiene and skin antiseptic. A timeout was performed prior to the initiation of the procedure. PROCEDURE: The bilateral flank including the indwelling left-sided drain was prepped with chlorhexidine in a  sterile fashion, and a patient is position prone and CT imaging of the abdomen performed. Sterile drape was applied covering the operative field. A sterile gown and sterile gloves were used for the procedure. Local anesthesia was provided with 1% Lidocaine. Using modified Seldinger technique, a 10 [place] biliary drain was placed into the right-sided fluid collection. Modified Seldinger technique was then used to exchange the left-sided  indwelling drain for a new 10 [place] biliary drain. Aspiration was performed of both fluid collection. Both drains were sutured in position and attached to bulb suction. Final image was stored. Patient tolerated the procedure well and remained hemodynamically stable throughout. No complications were encountered and no significant blood loss. Sample sent the lab from each side, for culture, triglycerides, creatinine. FINDINGS: Initial CT demonstrates residual fluid collection adjacent to the indwelling left drain. Fluid in the right-sided retroperitoneal region. Final image demonstrates decompression of bilateral retroperitoneal fluid collection with the biliary drains in position. IMPRESSION: Status post CT guided exchange of left-sided retroperitoneal drain for a new 10 [place] biliary drain, as well as CT-guided placement of a biliary drain into a right-sided persisting retroperitoneal fluid collection. Signed, Dulcy Fanny. Earleen Newport, DO [facility] Radiology Electronically Signed   By: Corrie Mckusick D.O.   On: 05/07/2017 17:17   Ct Image Guided Drainage By Percutaneous Catheter  Result Date: 04/30/2017 INDICATION: Bilateral abdominal retroperitoneal abscess EXAM: CT GUIDED DRAINAGE OF ABDOMINAL ABSCESS X2 MEDICATIONS: The patient is currently admitted to the hospital and receiving intravenous antibiotics. The antibiotics were administered within an appropriate time frame prior to the initiation of the procedure. ANESTHESIA/SEDATION: Two mg IV Versed 100 mcg IV Fentanyl Moderate Sedation Time:  25 minutes The patient was continuously monitored during the procedure by the interventional radiology nurse under my direct supervision. COMPLICATIONS: None immediate. TECHNIQUE: Informed written consent was obtained from the patient after a thorough discussion of the procedural risks, benefits and alternatives. All questions were addressed. Maximal Sterile Barrier Technique was  utilized including caps, mask, sterile gowns, sterile gloves, sterile drape, hand hygiene and skin antiseptic. A timeout was performed prior to the initiation of the procedure. PROCEDURE: The operative field was prepped with back in the prone position in a sterile fashion, and a sterile drape was applied covering the operative field. A sterile gown and sterile gloves were used for the procedure. Local anesthesia was provided with 1% Lidocaine. Under CT guidance, an 18 gauge needle was inserted into the left retroperitoneal abdominal fluid collection and removed over an Amplatz wire. Ten [place] dilator followed by a 10 [place] drain were inserted. It was looped and string fixed then sewn to the skin. The identical procedure was performed for the right retroperitoneal abdominal fluid collection. Cloudy yellow fluid was aspirated from each cavity and sent for culture. FINDINGS: Imaging demonstrates placement of bilateral 10 French abdominal abscess drains. IMPRESSION: Successful abdominal abscess drain x2. Electronically Signed   By: Marybelle Killings M.D.   On: 04/30/2017 17:58   Ct Image Guided Drainage By Percutaneous Catheter  Result Date: 04/30/2017 INDICATION: Bilateral abdominal retroperitoneal abscess EXAM: CT GUIDED DRAINAGE OF ABDOMINAL ABSCESS X2 MEDICATIONS: The patient is currently admitted to the hospital and receiving intravenous antibiotics. The antibiotics were administered within an appropriate time frame prior to the initiation of the procedure. ANESTHESIA/SEDATION: Two mg IV Versed 100 mcg IV Fentanyl Moderate Sedation Time:  25 minutes The patient was continuously monitored during the procedure by the interventional radiology nurse under my direct supervision. COMPLICATIONS: None  immediate. TECHNIQUE: Informed written consent was obtained from the patient after a thorough discussion of the procedural risks, benefits and alternatives. All questions were addressed. Maximal Sterile Barrier Technique was  utilized including caps, mask, sterile gowns, sterile gloves, sterile drape, hand hygiene and skin antiseptic. A timeout was performed prior to the initiation of the procedure. PROCEDURE: The operative field was prepped with back in the prone position in a sterile fashion, and a sterile drape was applied covering the operative field. A sterile gown and sterile gloves were used for the procedure. Local anesthesia was provided with 1% Lidocaine. Under CT guidance, an 18 gauge needle was inserted into the left retroperitoneal abdominal fluid collection and removed over an Amplatz wire. Ten [place] dilator followed by a 10 [place] drain were inserted. It was looped and string fixed then sewn to the skin. The identical procedure was performed for the right retroperitoneal abdominal fluid collection. Cloudy yellow fluid was aspirated from each cavity and sent for culture. FINDINGS: Imaging demonstrates placement of bilateral 10 French abdominal abscess drains. IMPRESSION: Successful abdominal abscess drain x2. Electronically Signed   By: Marybelle Killings M.D.   On: 04/30/2017 17:58   Ir Radiologist Eval & Mgmt  Result Date: 05/20/2017 Please refer to notes tab for details about interventional procedure. (Op Note)  Ir Radiologist Eval & Mgmt  Result Date: 05/18/2017 Please refer to notes tab for details about interventional procedure. (Op Note)  Ir Radiologist Eval & Mgmt  Result Date: 05/06/2017 Please refer to notes tab for details about interventional procedure. (Op Note)   Labs:  CBC: Recent Labs    05/01/17 1040 05/02/17 0509 05/07/17 1257 05/21/17 0804  WBC 5.8 5.6 10.6* 10.3  HGB 12.0 12.8 13.6 12.8  HCT 37.4 39.7 41.2 38.8  PLT 187 188 203 368    COAGS: Recent Labs    04/29/17 1654 05/07/17 1257  INR 0.94 0.95    BMP: Recent Labs    04/16/17 0411 04/29/17 1654 05/01/17 1040 05/02/17 0509  NA 141 137 142 141  K 4.2 4.1 4.0 3.9  CL 105 100* 103 103  CO2 30 28 32 31  GLUCOSE  136* 351* 50* 83  BUN 14 15 16 17   CALCIUM 8.4* 9.2 8.7* 8.7*  CREATININE 0.82 0.86 0.89 0.77  GFRNONAA >60 >60 >60 >60  GFRAA >60 >60 >60 >60    LIVER FUNCTION TESTS: Recent Labs    10/29/16 1105 04/08/17 1225 04/29/17 1654  BILITOT 0.4 0.2* 0.9  AST 20 39 65*  ALT 19 45 89*  ALKPHOS 88 90 95  PROT 6.2 6.4* 6.8  ALBUMIN 3.6 3.1* 3.3*    TUMOR MARKERS: No results for input(s): AFPTM, CEA, CA199, CHROMGRNA in the last 8760 hours.  Assessment and Plan:  Low abd abscess collections Post op Gyn surgery 04/15/17 Follows with Dr Dorann Ou Drains have been troublesome since placement-- dislodgement; dysfunction Scheduled again for left drain replacement;exchange of right drain Risks and benefits discussed with the patient including bleeding, infection, damage to adjacent structures, bowel perforation/fistula connection, and sepsis.  All of the patient's questions were answered, patient is agreeable to proceed. Consent signed and in chart.   Thank you for this interesting consult.  I greatly enjoyed meeting Katie Palmer and look forward to participating in their care.  A copy of this report was sent to the requesting provider on this date.  Electronically Signed: Lavonia Drafts, PA-C 05/21/2017, 8:45 AM   I spent a total of  25 Minutes in face to face in clinical consultation, greater than 50% of which was counseling/coordinating care for low abdominal post op abscess drains

## 2017-05-21 NOTE — Discharge Instructions (Signed)
Moderate Conscious Sedation, Adult, Care After These instructions provide you with information about caring for yourself after your procedure. Your health care provider may also give you more specific instructions. Your treatment has been planned according to current medical practices, but problems sometimes occur. Call your health care provider if you have any problems or questions after your procedure. What can I expect after the procedure? After your procedure, it is common:  To feel sleepy for several hours.  To feel clumsy and have poor balance for several hours.  To have poor judgment for several hours.  To vomit if you eat too soon.  Follow these instructions at home: For at least 24 hours after the procedure:   Do not: ? Participate in activities where you could fall or become injured. ? Drive. ? Use heavy machinery. ? Drink alcohol. ? Take sleeping pills or medicines that cause drowsiness. ? Make important decisions or sign legal documents. ? Take care of children on your own.  Rest. Eating and drinking  Follow the diet recommended by your health care provider.  If you vomit: ? Drink water, juice, or soup when you can drink without vomiting. ? Make sure you have little or no nausea before eating solid foods. General instructions  Have a responsible adult stay with you until you are awake and alert.  Take over-the-counter and prescription medicines only as told by your health care provider.  If you smoke, do not smoke without supervision.  Keep all follow-up visits as told by your health care provider. This is important. Contact a health care provider if:  You keep feeling nauseous or you keep vomiting.  You feel light-headed.  You develop a rash.  You have a fever. Get help right away if:  You have trouble breathing. This information is not intended to replace advice given to you by your health care provider. Make sure you discuss any questions you have  with your health care provider. Document Released: 11/02/2012 Document Revised: 06/17/2015 Document Reviewed: 05/04/2015 Elsevier Interactive Patient Education  2018 [place]. Ascites Drainage Catheter Home Guide An ascites drainage catheter is a thin, flexible tube that helps to drain excess fluid that has collected in the abdomen. Draining the fluid can help to prevent pain and other problems from developing. There are several kinds of ascites drainage systems. Your health care provider will teach you how to use your system. He or she will also give you directions about how much fluid to drain and how often to drain it. Follow these general guidelines as well as the instructions that you receive about your specific drainage system. Call your health care provider if you have any questions or concerns. How do I use the catheter to drain fluid? 1. Clean any surface that you will be using. 2. Wash your hands with soap and warm water. If your hands are not visibly dirty, you can use an alcohol-based skin cleaner instead. 3. Put on a mask. Make sure that it covers your nose and your mouth. 4. Within reach on the surfaces that you have cleaned, place all of the supplies that you will need. 5. Clean the safety valve with an alcohol wipe. 6. Attach the adapter or access tip to the safety valve on your catheter. To do this, you may need to remove a protective cap on the safety valve. 7. Release the clamps on your drainage container. 8. Allow the fluid to drain into the container. 9. Close the clamps and release the catheter  from the drainage container. 10. Write down the amount of fluid that was drained, if your health care provider has directed you to do that. 11. Follow instructions from your health care provider about storing and transporting the fluid if you are directed to bring the fluid to your health care provider's office. If you are told to dispose of the fluid, pour it down the toilet or do  as directed by your health care provider. 12. With an alcohol-based or bleach-based cleaner, clean the surfaces where any fluid was spilled. What should I do after I drain the fluid? 1. Wipe the end of your catheter with an antiseptic solution, such as rubbing alcohol. 2. Put the protective cap back onto the safety valve. 3. Place gauze bandages (dressings) around the catheter site as directed by your health care provider. 4. Secure the catheter to your body with gauze and dressings as directed by your health care provider. Contact a health care provider if:  You have skin irritation, redness, or pain where the catheter was inserted (insertion site).  You have trouble draining fluid.  You have trouble caring for your catheter.  You have abdominal pain, bloating, or cramping. Get help right away if:  You develop a fever or chills.  You have increasing redness or increasing pain around the insertion site.  You have blood or pus coming from the insertion site.  Fluid that drains out of the catheter becomes cloudy or has a bad smell.  You have abdominal pain or cramping that worsens or does not go away. This information is not intended to replace advice given to you by your health care provider. Make sure you discuss any questions you have with your health care provider. Document Released: 01/01/2011 Document Revised: 06/20/2015 Document Reviewed: 01/08/2014 Elsevier Interactive Patient Education  2018 [place] Surgical drains are used to remove extra fluid that normally builds up in a surgical wound after surgery. A surgical drain helps to heal a surgical wound. Different kinds of surgical drains include:  Active drains. These drains use suction to pull drainage away from the surgical wound. Drainage flows through a tube to a container outside of the body. It is important to keep the bulb or the drainage container flat (compressed) at all times, except  while you empty it. Flattening the bulb or container creates suction. The two most common types of active drains are bulb drains and Hemovac drains.  Passive drains. These drains allow fluid to drain naturally, by gravity. Drainage flows through a tube to a bandage (dressing) or a container outside of the body. Passive drains do not need to be emptied. The most common type of passive drain is the Penrose drain.  A drain is placed during surgery. Immediately after surgery, drainage is usually bright red and a little thicker than water. The drainage may gradually turn yellow or pink and become thinner. It is likely that your health care provider will remove the drain when the drainage stops or when the amount decreases to 1-2 Tbsp (15-30 mL) during a 24-hour period. How to care for your surgical drain  Keep the skin around the drain dry and covered with a dressing at all times.  Check your drain area every day for signs of infection. Check for: ? More redness, swelling, or pain. ? Pus or a bad smell. ? Cloudy drainage. Follow instructions from your health care provider about how to take care of your drain and how to change your  dressing. Change your dressing at least one time every day. Change it more often if needed to keep the dressing dry. Make sure you: 1. Gather your supplies, including: ? Tape. ? Germ-free cleaning solution (sterile saline). ? Split gauze drain sponge: 4 x 4 inches (10 x 10 cm). ? Gauze square: 4 x 4 inches (10 x 10 cm). 2. Wash your hands with soap and water before you change your dressing. If soap and water are not available, use hand sanitizer. 3. Remove the old dressing. Avoid using scissors to do that. 4. Use sterile saline to clean your skin around the drain. 5. Place the tube through the slit in a drain sponge. Place the drain sponge so that it covers your wound. 6. Place the gauze square or another drain sponge on top of the drain sponge that is on the wound. Make  sure the tube is between those layers. 7. Tape the dressing to your skin. 8. If you have an active bulb or Hemovac drain, tape the drainage tube to your skin 1-2 inches (2.5-5 cm) below the place where the tube enters your body. Taping keeps the tube from pulling on any stitches (sutures) that you have. 9. Wash your hands with soap and water. 10. Write down the color of your drainage and how often you change your dressing.  How to empty your active bulb or Hemovac drain 1. Make sure that you have a measuring cup that you can empty your drainage into. 2. Wash your hands with soap and water. If soap and water are not available, use hand sanitizer. 3. Gently move your fingers down the tube while squeezing very lightly. This is called stripping the tube. This clears any drainage, clots, or tissue from the tube. ? Do not pull on the tube. ? You may need to strip the tube several times every day to keep the tube clear. 4. Open the bulb cap or the drain plug. Do not touch the inside of the cap or the bottom of the plug. 5. Empty all of the drainage into the measuring cup. 6. Compress the bulb or the container and replace the cap or the plug. To compress the bulb or the container, squeeze it firmly in the middle while you close the cap or plug the container. 7. Write down the amount of drainage that you have in each 24-hour period. If you have less than 2 Tbsp (30 mL) of drainage during 24 hours, contact your health care provider. 8. Flush the drainage down the toilet. 9. Wash your hands with soap and water. Contact a health care provider if:  You have more redness, swelling, or pain around your drain area.  The amount of drainage that you have is increasing instead of decreasing.  You have pus or a bad smell coming from your drain area.  You have a fever.  You have drainage that is cloudy.  There is a sudden stop or a sudden decrease in the amount of drainage that you have.  Your tube falls  out.  Your active draindoes not stay compressedafter you empty it. This information is not intended to replace advice given to you by your health care provider. Make sure you discuss any questions you have with your health care provider. Document Released: 01/10/2000 Document Revised: 06/20/2015 Document Reviewed: 08/01/2014 Elsevier Interactive Patient Education  2018 [place]. Percutaneous Abscess Drain, Care After This sheet gives you information about how to care for yourself after your procedure. Your health care  provider may also give you more specific instructions. If you have problems or questions, contact your health care provider. What can I expect after the procedure? After your procedure, it is common to have:  A small amount of bruising and discomfort in the area where the drainage tube (catheter) was placed.  Sleepiness and fatigue. This should go away after the medicines you were given have worn off.  Follow these instructions at home: Incision care  Follow instructions from your health care provider about how to take care of your incision. Make sure you: ? Wash your hands with soap and water before you change your bandage (dressing). If soap and water are not available, use hand sanitizer. ? Change your dressing as told by your health care provider. ? Leave stitches (sutures), skin glue, or adhesive strips in place. These skin closures may need to stay in place for 2 weeks or longer. If adhesive strip edges start to loosen and curl up, you may trim the loose edges. Do not remove adhesive strips completely unless your health care provider tells you to do that.  Check your incision area every day for signs of infection. Check for: ? More redness, swelling, or pain. ? More fluid or blood. ? Warmth. ? Pus or a bad smell. ? Fluid leaking from around your catheter (instead of fluid draining through your catheter). Catheter care  Follow instructions from your health  care provider about emptying and cleaning your catheter and collection bag. You may need to clean the catheter every day so it does not clog.  If directed, write down the following information every time you empty your bag: ? The date and time. ? The amount of drainage. General instructions  Rest at home for 1-2 days after your procedure. Return to your normal activities as told by your health care provider.  Do not take baths, swim, or use a hot tub for 24 hours after your procedure, or until your health care provider says that this is okay.  Take over-the-counter and prescription medicines only as told by your health care provider.  Keep all follow-up visits as told by your health care provider. This is important. Contact a health care provider if:  You have less than 10 mL of drainage a day for 2-3 days in a row, or as directed by your health care provider.  You have more redness, swelling, or pain around your incision area.  You have more fluid or blood coming from your incision area.  Your incision area feels warm to the touch.  You have pus or a bad smell coming from your incision area.  You have fluid leaking from around your catheter (instead of through your catheter).  You have a fever or chills.  You have pain that does not get better with medicine. Get help right away if:  Your catheter comes out.  You suddenly stop having drainage from your catheter.  You suddenly have blood in the fluid that is draining from your catheter.  You become dizzy or you faint.  You develop a rash.  You have nausea or vomiting.  You have difficulty breathing or you feel short of breath.  You develop chest pain.  You have problems with your speech or vision.  You have trouble balancing or moving your arms or legs. Summary  It is common to have a small amount of bruising and discomfort in the area where the drainage tube (catheter) was placed.  You may be directed to record  the amount of drainage from the bag every time you empty it.  Follow instructions from your health care provider about emptying and cleaning your catheter and collection bag. This information is not intended to replace advice given to you by your health care provider. Make sure you discuss any questions you have with your health care provider. Document Released: 05/29/2013 Document Revised: 12/05/2015 Document Reviewed: 12/05/2015 Elsevier Interactive Patient Education  2017 [place] this sheet to all of your post-operative appointments while you have your drains.  Please measure your drains by CC's or ML's.  Make sure you drain and measure your JP Drains 3 times per day.  At the end of each day, add up totals for the left side and add up totals for the right side.    ( 9 am )     ( 3 pm )        ( 9 pm )                Date L  R  L  R  L  R  Total L/R

## 2017-05-21 NOTE — Procedures (Signed)
  Procedure: CT L retroperitoneal drain placement 15F  67ml out EBL:   minimal Complications:  none immediate  See full dictation in [facility].  Dillard Cannon MD Main # [phone] Pager  [phone]

## 2017-05-24 ENCOUNTER — Other Ambulatory Visit: Payer: Self-pay | Admitting: Hematology and Oncology

## 2017-05-24 ENCOUNTER — Telehealth: Payer: Self-pay | Admitting: Oncology

## 2017-05-24 DIAGNOSIS — N739 Female pelvic inflammatory disease, unspecified: Secondary | ICD-10-CM

## 2017-05-24 NOTE — Telephone Encounter (Addendum)
Talked to patient regarding her appointments for this week as her chemotherapy has been canceled per Dr. Alvy Bimler.  She said she would like to cancel the lab appointment and chemo class on 05/25/17.  She will keep the follow up with Dr. Alvy Bimler on 05/26/17.   Also discussed rescheduling Radiation Oncology consult with Dr. Sondra Come on 05/31/17 at 12:30 for the nurse and 1 pm for Dr. Sondra Come.   She would like to take that appointment and would like the chemo class rescheduled after this appointment if possible.  Message sent to scheduling.

## 2017-05-24 NOTE — Telephone Encounter (Signed)
Left message for patient to reschedule her radiation consult.

## 2017-05-25 ENCOUNTER — Other Ambulatory Visit: Payer: Medicare Other

## 2017-05-26 ENCOUNTER — Encounter: Payer: Self-pay | Admitting: Oncology

## 2017-05-26 ENCOUNTER — Other Ambulatory Visit: Payer: Medicare Other

## 2017-05-26 ENCOUNTER — Encounter: Payer: Self-pay | Admitting: Hematology and Oncology

## 2017-05-26 ENCOUNTER — Telehealth: Payer: Self-pay | Admitting: Hematology and Oncology

## 2017-05-26 ENCOUNTER — Inpatient Hospital Stay: Payer: Medicare Other | Attending: Gynecologic Oncology | Admitting: Hematology and Oncology

## 2017-05-26 DIAGNOSIS — G8918 Other acute postprocedural pain: Secondary | ICD-10-CM

## 2017-05-26 DIAGNOSIS — R188 Other ascites: Secondary | ICD-10-CM | POA: Diagnosis not present

## 2017-05-26 DIAGNOSIS — C541 Malignant neoplasm of endometrium: Secondary | ICD-10-CM | POA: Diagnosis present

## 2017-05-26 DIAGNOSIS — M79605 Pain in left leg: Secondary | ICD-10-CM | POA: Diagnosis not present

## 2017-05-26 DIAGNOSIS — N1330 Unspecified hydronephrosis: Secondary | ICD-10-CM | POA: Insufficient documentation

## 2017-05-26 MED ORDER — MORPHINE SULFATE ER 30 MG PO TBCR: 30.0000 mg | EXTENDED_RELEASE_TABLET | Freq: Two times a day (BID) | ORAL | 0 refills | Status: DC

## 2017-05-26 MED ORDER — MORPHINE SULFATE 30 MG PO TABS: 30.0000 mg | ORAL_TABLET | ORAL | 0 refills | Status: DC | PRN

## 2017-05-26 MED FILL — MORPHINE SULFATE IR 30 MG T: 30 | 15 days supply | Qty: 90 | Fill #0

## 2017-05-26 MED FILL — MORPHINE SULF ER 30 MG TAB: 30 | 30 days supply | Qty: 60 | Fill #0

## 2017-05-26 NOTE — Progress Notes (Signed)
[place] OFFICE PROGRESS NOTE  Patient Care Team: Laurey Morale, MD as PCP - General  ASSESSMENT & PLAN:  Endometrial cancer [facility]) We discussed the role of adjuvant treatment Unfortunately, recent CT scan showed persistent fluid collection She has multiple drains in situ I will delay initiation of chemotherapy I plan to see her back next week after repeat CT scan to determine the start date of treatment We have rescheduled chemo education class I will continue to provide active supportive care  Acute postoperative pain She has severe postoperative pain She was prescribed MS Contin along with immediate release morphine without good success of controlling her pain I have increased the dose of morphine sulfate I warned her about the risk of sedation and constipation I will see her back next week for further assessment of pain control  Pelvic fluid collection She has multiple fluid collection on recent imaging I would defer to radiologist for further assessment and management She is not ready to begin chemotherapy until all her drains are removed   No orders of the defined types were placed in this encounter.   INTERVAL HISTORY: Please see below for problem oriented charting. She returns for further follow-up She is in a lot of pain due to presence of multiple intra-abdominal drains She denies recent fever or chills Her blood sugar appears to be under good control She denies bleeding No recent changes in bowel habits Denies nausea  SUMMARY OF ONCOLOGIC HISTORY: Oncology History   High grade carcinosarcoma     Endometrial cancer ([place])   12/25/2015 Pathology Results    1. Ovary and fallopian tube, right - BENIGN OVARIAN FIBROTHECOMA. - NO ENDOMETRIOSIS OR MALIGNANCY. - BENIGN FALLOPIAN TUBE WITH BENIGN PARATUBAL CYST. 2. Endometrium, curettage - INACTIVE ENDOMETRIUM, BENIGN ENDOMETRIAL POLYP AND BENIGN SQUAMOUS FRAGMENTS. - NO HYPERPLASIA OR  MALIGNANCY      12/25/2015 Surgery    PRE-OPERATIVE DIAGNOSIS:  RLQ PAIN, thickened endometrium  POST-OPERATIVE DIAGNOSIS:  R O lesion, adhesions, polyp in uterus  PROCEDURE:  Procedure(s): DILATATION AND CURETTAGE /HYSTEROSCOPY (N/A) LAPAROSCOPY OPERATIVE (N/A) LAPAROSCOPIC LYSIS OF ADHESIONS (N/A) LAPAROSCOPIC UNILATERAL SALPINGO OOPHORECTOMY (Right        01/25/2017 Initial Diagnosis    The patient reports postmenopausal bleeding since December, 2018. Pap smear showed AGUS.         03/17/2017 Pathology Results    Endometrial biopsy on 03/17/17 showed grade 3 endometrial adenocarcinoma.      03/26/2017 Imaging    Ct abdomen Diffuse endometrial thickening consistent with endometrial carcinoma, with probable focal area of deep myometrial invasion in the anterior fundus.  No evidence of extra uterine tumor extension, lymphadenopathy, or distant metastatic disease.  Two tiny bilateral lower lobe pulmonary nodules, largest measuring 5 mm. These are indeterminate, but likely benign. Recommend follow-up with chest CT without contrast in 3-6 months.      04/15/2017 Surgery    Operation: Robotic-assisted laparoscopic total hysterectomy with left salpingoophorectomy, SLN biopsy, para-aortic lymphadenectomy (bilateral), vaginal biopsy.  Surgeon: Donaciano Eva  Operative Findings:  : 32mm nodule on left proximal (upper) vaginal side wall (benign on frozen). 8cm normal appearing uterus, surgically absent right ovary and tube, normal left ovary somewhat adherent to round ligament. Indurated left external iliac SLN. No other suspicious nodes or apparent extrauterine disease        04/15/2017 Pathology Results    1. Vagina, biopsy - ATYPICAL CELLS, SUSPICIOUS BUT NOT DIAGNOSTIC OF MALIGNANCY. SEE NOTE 2. Lymph node, sentinel, biopsy, right presacral #1 -  LYMPH NODE, NEGATIVE FOR MALIGNANCY (0/1) - IMMUNOSTAIN FOR PANKERATIN SHOWS FOCAL NONSPECIFIC LABELING 3. Lymph node,  sentinel, biopsy, right presacral #2 - LYMPH NODE, NEGATIVE FOR MALIGNANCY (0/1) - IMMUNOSTAIN FOR PANKERATIN IS NEGATIVE 4. Lymph node, sentinel, biopsy, left external iliac - LYMPH NODE, NEGATIVE FOR MALIGNANCY (0/1) - IMMUNOSTAIN FOR PANKERATIN IS NEGATIVE 5. Lymph node, biopsy, right periaortic - TWO LYMPH NODES, NEGATIVE FOR MALIGNANCY (0/2) 6. Lymph node, biopsy, left periaortic - SEVEN LYMPH NODES, NEGATIVE FOR MALIGNANCY (0/7) 7. 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 SEE NOTE - TUMOR INVADES A LITTLE OVER HALF OF THE MYOMETRIUM (INVASION DEPTH OF 0.7 CM WHERE MYOMETRIUM IS 1.3 CM THICK) 1. Vagina, biopsy - ATYPICAL CELLS, SUSPICIOUS BUT NOT DIAGNOSTIC OF MALIGNANCY. SEE NOTE 2. Lymph node, sentinel, biopsy, right presacral #1 - LYMPH NODE, NEGATIVE FOR MALIGNANCY (0/1) - IMMUNOSTAIN FOR PANKERATIN SHOWS FOCAL NONSPECIFIC LABELING 3. Lymph node, sentinel, biopsy, right presacral #2 - LYMPH NODE, NEGATIVE FOR MALIGNANCY (0/1) - IMMUNOSTAIN FOR PANKERATIN IS NEGATIVE 4. Lymph node, sentinel, biopsy, left external iliac - LYMPH NODE, NEGATIVE FOR MALIGNANCY (0/1) - IMMUNOSTAIN FOR PANKERATIN IS NEGATIVE 5. Lymph node, biopsy, right periaortic - TWO LYMPH NODES, NEGATIVE FOR MALIGNANCY (0/2) 6. Lymph node, biopsy, left periaortic - SEVEN LYMPH NODES, NEGATIVE FOR MALIGNANCY (0/7) 7. 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 SEE NOTE - TUMOR INVADES A LITTLE OVER HALF OF THE MYOMETRIUM (INVASION DEPTH OF 0.7 CM WHERE MYOMETRIUM IS 1.3 CM THICK)  7. ONCOLOGY TABLE-UTERUS, CARCINOMA OR CARCINOSARCOMA 1 of 4 Duplicate copy FINAL for Geurin, Romana [phone]) Microscopic  Comment(continued) Specimen: Uterus with left fallopian tube and ovary Procedure: Total hysterectomy with left salpingoophorectomy Lymph node sampling performed: Sentinel and non-sentinel Specimen integrity: Intact Maximum tumor size: 4.2 cm Histologic type: Carcinosarcoma (Malignant mullerian mixed tumor) Grade: High-grade Myometrial invasion: 0.7 cm where myometrium is 1.3 cm in thickness Cervical stromal involvement: Not identified Extent of involvement of other organs: Not involved Lymph - vascular invasion: Not identified Peritoneal washings: NA Lymph nodes: Examined: 3 Sentinel 9 Non-sentinel 12 Total Lymph nodes with metastasis: 0 Isolated tumor cells (< 0.2 mm): 0 Micrometastasis: (> 0.2 mm and < 2.0 mm): 0 Macrometastasis: (> 2.0 mm): 0 Extracapsular extension: NA TNM code: pT1b, pN0 FIGO Stage (based on pathologic findings, needs clinical correlation): 1B      04/29/2017 Imaging    CT abdomen Multiloculated bilateral retroperitoneal fluid collections, consistent with postoperative lymphoceles.  No evidence of hydronephrosis or other complication      05/28/9765 - 05/02/2017 Hospital Admission    She was admitted for evaluation of severe pain. CT showed fluid collection and IR was consulted. Drain was placed to drain the fluid collection      04/30/2017 Cancer Staging    Staging form: Corpus Uteri - Carcinoma and Carcinosarcoma, AJCC 8th Edition - Pathologic: Stage IB (pT1b, pN0, cM0) - Signed by Heath Lark, MD on 04/30/2017      04/30/2017 Procedure    Successful abdominal abscess drain x2.      05/06/2017 Imaging    1. Interval retraction of right-sided percutaneous drainage catheter with end now coiled and locked peripheral the grossly unchanged right-sided retroperitoneal fluid collection, presumed postoperative seroma. 2. Unchanged positioning of left-sided percutaneous drainage catheter with end coiled and locked within  the peripheral aspect of the grossly  unchanged left-sided retroperitoneal fluid collection, presumed postoperative seroma.  PLAN: - The malpositioned right-sided retroperitoneal drainage catheter was removed at the patient's bedside without incident      05/18/2017 Imaging    Right retroperitoneal fluid collections are improved. The right drain has been repositioned into the more anterior fluid collection which is smaller than on the prior study.  The lateral left retroperitoneal fluid collection has resolved after drainage. The more medial and anterior fluid collection has increased in size. The drain is not coiled in this more medial fluid collection.       REVIEW OF SYSTEMS:   Constitutional: Denies fevers, chills or abnormal weight loss Eyes: Denies blurriness of vision Ears, nose, mouth, throat, and face: Denies mucositis or sore throat Respiratory: Denies cough, dyspnea or wheezes Cardiovascular: Denies palpitation, chest discomfort or lower extremity swelling Gastrointestinal:  Denies nausea, heartburn or change in bowel habits Skin: Denies abnormal skin rashes Lymphatics: Denies new lymphadenopathy or easy bruising Neurological:Denies numbness, tingling or new weaknesses Behavioral/Psych: Mood is stable, no new changes  All other systems were reviewed with the patient and are negative.  I have reviewed the past medical history, past surgical history, social history and family history with the patient and they are unchanged from previous note.  ALLERGIES:  is allergic to armodafinil; invokana [canagliflozin]; lasix [furosemide]; and adhesive [tape].  MEDICATIONS:  Current Outpatient Medications  Medication Sig Dispense Refill  . Acetaminophen (TYLENOL PO) Take 1 tablet by mouth every 6 (six) hours as needed (pain/headache).    . ALPRAZolam (XANAX) 1 MG tablet TAKE 1/2 TO 1 TABLET BY MOUTH AT BEDTIME AS NEEDED FOR ANXIETY (Patient taking differently: TAKE 1/2 TO 1 TABLET (0.5 - 1 MG)  BY MOUTH AT BEDTIME AS  NEEDED FOR ANXIETY) 30 tablet 5  . DULoxetine (CYMBALTA) 30 MG capsule Take 90 mg by mouth at bedtime.   2  . ezetimibe (ZETIA) 10 MG tablet Take 10 mg by mouth at bedtime.   12  . gabapentin (NEURONTIN) 100 MG capsule Take 200 mg two times a day 120 capsule 0  . gabapentin (NEURONTIN) 400 MG capsule Take 1 capsule (400 mg total) by mouth at bedtime. 30 capsule 0  . glucagon (GLUCAGON EMERGENCY) 1 MG injection Inject 1 mg into the muscle once as needed (low blood sugar).    . hydrocortisone cream 1 % Apply topically 2 (two) times daily. (Patient taking differently: Apply 1 application topically 2 (two) times daily as needed for itching. ) 30 g 0  . Insulin Human (INSULIN PUMP) SOLN Inject into the skin continuous. Use with Novolog    . losartan-hydrochlorothiazide (HYZAAR) 50-12.5 MG tablet TAKE 1 TABLET BY MOUTH EVERY DAY 90 tablet 0  . morphine (MS CONTIN) 30 MG 12 hr tablet Take 1 tablet (30 mg total) by mouth every 12 (twelve) hours. 60 tablet 0  . morphine (MSIR) 30 MG tablet Take 1 tablet (30 mg total) by mouth every 4 (four) hours as needed for severe pain. 90 tablet 0  . senna (SENOKOT) 8.6 MG TABS tablet Take 1 tablet (8.6 mg total) by mouth at bedtime. 120 each 0  . SUMAtriptan (IMITREX) 100 MG tablet TAKE 1 TABLET BY MOUTH AS NEEDED FOR MIGRAINES. MAY REPEAT IN 2 HOURS IF NEEDED (Patient taking differently: Take 100 mg by mouth every 2 (two) hours as needed for migraine. ) 30 tablet 5  . traMADol (ULTRAM) 50 MG tablet TAKE 2 TABLETS BY MOUTH EVERY  4 HOURS AS NEEDED (Patient taking differently: TAKE 1 TABLET (50 MG) BY MOUTH EVERY 4 HOURS AS NEEDED FOR PAIN) 360 tablet 1  . traZODone (DESYREL) 100 MG tablet Take 100 mg by mouth at bedtime.   1  . Vitamin D, Ergocalciferol, (DRISDOL) 50000 units CAPS capsule Take 50,000 Units by mouth every Sunday.     No current facility-administered medications for this visit.     PHYSICAL EXAMINATION: ECOG PERFORMANCE STATUS: 1 - Symptomatic but  completely ambulatory  Vitals:   05/26/17 1109  BP: 116/77  Pulse: 86  Resp: (!) 22  Temp: 98.2 F (36.8 C)  SpO2: 100%   Filed Weights   05/26/17 1109  Weight: 218 lb 14.4 oz (99.3 kg)    GENERAL:alert, no distress and comfortable ABDOMEN:abdomen soft, multiple drains in situ Musculoskeletal:no cyanosis of digits and no clubbing  NEURO: alert & oriented x 3 with fluent speech, no focal motor/sensory deficits  LABORATORY DATA:  I have reviewed the data as listed    Component Value Date/Time   NA 135 05/21/2017 0804   K 4.1 05/21/2017 0804   CL 96 (L) 05/21/2017 0804   CO2 25 05/21/2017 0804   GLUCOSE 224 (H) 05/21/2017 0804   BUN 10 05/21/2017 0804   CREATININE 0.94 05/21/2017 0804   CALCIUM 8.9 05/21/2017 0804   PROT 6.8 04/29/2017 1654   ALBUMIN 3.3 (L) 04/29/2017 1654   AST 65 (H) 04/29/2017 1654   ALT 89 (H) 04/29/2017 1654   ALKPHOS 95 04/29/2017 1654   BILITOT 0.9 04/29/2017 1654   GFRNONAA >60 05/21/2017 0804   GFRAA >60 05/21/2017 0804    No results found for: SPEP, UPEP  Lab Results  Component Value Date   WBC 10.3 05/21/2017   NEUTROABS 4.1 10/29/2016   HGB 12.8 05/21/2017   HCT 38.8 05/21/2017   MCV 87.2 05/21/2017   PLT 368 05/21/2017      Chemistry      Component Value Date/Time   NA 135 05/21/2017 0804   K 4.1 05/21/2017 0804   CL 96 (L) 05/21/2017 0804   CO2 25 05/21/2017 0804   BUN 10 05/21/2017 0804   CREATININE 0.94 05/21/2017 0804      Component Value Date/Time   CALCIUM 8.9 05/21/2017 0804   ALKPHOS 95 04/29/2017 1654   AST 65 (H) 04/29/2017 1654   ALT 89 (H) 04/29/2017 1654   BILITOT 0.9 04/29/2017 1654       RADIOGRAPHIC STUDIES: I have personally reviewed the radiological images as listed and agreed with the findings in the report. Ct Abdomen Pelvis W Contrast  Result Date: 05/18/2017 CLINICAL DATA:  Follow-up retroperitoneal drainage EXAM: CT ABDOMEN AND PELVIS WITH CONTRAST TECHNIQUE: Multidetector CT imaging of  the abdomen and pelvis was performed using the standard protocol following bolus administration of intravenous contrast. CONTRAST:  129mL ISOVUE-300 IOPAMIDOL (ISOVUE-300) INJECTION 61% COMPARISON:  05/06/2017 FINDINGS: Lower chest: Dependent atelectasis Hepatobiliary: Nonspecific mild gallbladder wall thickening. No liver lesion. Pancreas: Unremarkable Spleen: Unremarkable Adrenals/Urinary Tract: Stable mild hydronephrosis. Adrenal glands are unremarkable. Bladder decompressed. Stomach/Bowel: Moderate stool burden throughout the colon. No evidence of small-bowel obstruction. Normal appendix. Stomach is distended with enteric contents. Vascular/Lymphatic: No evidence of aortic aneurysm. Reproductive: Post hysterectomy Other: There has been further improvement regarding the retroperitoneal fluid collections. The right drain has been repositioned into the more central retroperitoneal fluid collection. The perinephric fluid collection on the right has resolved. Today, the fluid collection layering over the right psoas muscle measures 2.3 x  3.4 cm which is improved. The drain is coiled within this fluid collection. On the left, the more lateral fluid collection has been drained and is now resolved. There is a separate more anterior fluid collection which is larger and today measures 4.4 x 4.5 cm. The drain is adjacent to this fluid collection. There is stranding surrounding the lower abdominal aorta and within the retroperitoneum likely due to inflammatory change. This has increased since the prior study. Musculoskeletal: No vertebral compression deformity. Degenerative changes. IMPRESSION: Right retroperitoneal fluid collections are improved. The right drain has been repositioned into the more anterior fluid collection which is smaller than on the prior study. The lateral left retroperitoneal fluid collection has resolved after drainage. The more medial and anterior fluid collection has increased in size. The drain is  not coiled in this more medial fluid collection. Electronically Signed   By: Marybelle Killings M.D.   On: 05/18/2017 13:12   Ct Abdomen Pelvis W Contrast  Result Date: 05/06/2017 CLINICAL DATA:  History of hysterectomy and BSO and lymph node dissection for endometrial carcinoma who presented to the emergency department on 04/29/2017 with abdominal pain and subsequent CT scan demonstrating development of bilateral retroperitoneal fluid collections concerning for postoperative seromas and/or abscesses. As such, patient underwent bilateral CT-guided percutaneous drainage catheter placement on 04/30/2017 by Dr. Barbie Banner. Patient presents today to the interventional radiology drain Clinic for drainage catheter evaluation and management. EXAM: CT ABDOMEN AND PELVIS WITH CONTRAST TECHNIQUE: Multidetector CT imaging of the abdomen and pelvis was performed using the standard protocol following bolus administration of intravenous contrast. CONTRAST:  195mL ISOVUE-300 IOPAMIDOL (ISOVUE-300) INJECTION 61% COMPARISON:  CT abdomen pelvis-04/29/2017; 03/26/2017; CT-guided bilateral retroperitoneal drainage catheter placement-04/30/2017 FINDINGS: Lower chest: Limited visualization of the lower thorax demonstrates unchanged punctate (approximately 0.4 cm) nodules within the right lower (image 4, series 4 and left lower (image 11, series 4) lobes. Minimal subsegmental atelectasis within the medial basilar aspect of the left lower lobe. No focal airspace opacities. No pleural effusion. Normal heart size.  No pericardial effusion. Hepatobiliary: Normal hepatic contour. There is a minimal amount of focal fatty infiltration adjacent to the fissure for ligamentum teres. No discrete hepatic lesions. Normal appearance of the gallbladder given degree distention. No radiopaque gallstones. Grossly unchanged very mild centralized intrahepatic biliary duct dilatation. No extrahepatic bili duct dilatation. No ascites. Pancreas: The pancreas remains  slightly atrophic. Spleen: Normal appearance of the spleen. Adrenals/Urinary Tract: There is symmetric enhancement of the bilateral kidneys. No renal stones in this postcontrast examination. No discrete renal lesions. No urinary obstruction or perinephric stranding. Normal appearance the bilateral adrenal glands. Normal appearance of the urinary bladder given underdistention. Stomach/Bowel: Moderate colonic stool burden without evidence of enteric obstruction. The cecum is noted to be located within the right mid abdomen. Normal appearance of the terminal ileum. The appendix is not visualized, however there is no pericecal inflammatory change. No pneumoperitoneum, pneumatosis or portal venous gas. Vascular/Lymphatic: Normal caliber the abdominal aorta. The major branch vessels of the abdominal aorta appear patent on this non CTA examination. No bulky retroperitoneal, mesenteric, pelvic or inguinal lymphadenopathy. Reproductive: Post hysterectomy and bilateral oophorectomy. No free fluid the pelvic cul-de-sac. Other: Right-sided retroperitoneal percutaneous drainage catheter has been retracted is currently located outside the residual right retroperitoneal fluid collection the right-sided retroperitoneal fluid collection is grossly unchanged in size measuring approximately 10.5 x 4.8 x 9.7 cm (axial image 43, series 2, coronal image 77, series 6, previously, 9.3 x 3.5 x 9.6 cm. The right-sided retroperitoneal  drainage catheter is located within the peripheral aspect of the left retroperitoneal fluid collection and the left-sided fluid collection is also grossly unchanged in size measuring approximately 9.6 x 5.4 x 13.1 cm (axial image 38, series 2, coronal image 81, series 6 previously, 9.5 x 4.8 x 14.1 cm. Musculoskeletal: No acute or aggressive osseous abnormalities. Stigmata of DISH within thoracic spine. Grade 1 anterolisthesis of L4 upon L5 measuring approximately 5 mm without associated pars defects, unchanged.  Bilateral degenerative change of the lower lumbar spine. IMPRESSION: 1. Interval retraction of right-sided percutaneous drainage catheter with end now coiled and locked peripheral the grossly unchanged right-sided retroperitoneal fluid collection, presumed postoperative seroma. 2. Unchanged positioning of left-sided percutaneous drainage catheter with end coiled and locked within the peripheral aspect of the grossly unchanged left-sided retroperitoneal fluid collection, presumed postoperative seroma. PLAN: - The malpositioned right-sided retroperitoneal drainage catheter was removed at the patient's bedside without incident. - Above discussed with both Dr. Alvy Bimler and [name] and the decision was made to proceed with CT-guided replacement of the right-sided retroperitoneal percutaneous drainage catheter and exchange/up sizing of the left-sided retroperitoneal drainage catheter. This will be performed at an outpatient basis tomorrow [facility] Dr. Earleen Newport. Electronically Signed   By: Sandi Mariscal M.D.   On: 05/06/2017 15:25   Ct Abdomen Pelvis W Contrast  Result Date: 04/29/2017 CLINICAL DATA:  2 weeks postop from hysterectomy, BSO, and lymph node dissection for endometrial carcinoma. Severe left pelvic and left leg pain. EXAM: CT ABDOMEN AND PELVIS WITH CONTRAST TECHNIQUE: Multidetector CT imaging of the abdomen and pelvis was performed using the standard protocol following bolus administration of intravenous contrast. CONTRAST:  144mL ISOVUE-300 IOPAMIDOL (ISOVUE-300) INJECTION 61% COMPARISON:  03/26/2017 FINDINGS: Lower Chest: Tiny sub-cm bilateral lower lobe pulmonary nodules are stable. Hepatobiliary: No hepatic masses identified. Gallbladder is unremarkable. Pancreas:  No mass or inflammatory changes. Spleen: Within normal limits in size and appearance. Adrenals/Urinary Tract: No masses identified. No evidence of hydronephrosis. Unremarkable unopacified urinary bladder. Stomach/Bowel: No  evidence of obstruction, inflammatory process or abnormal fluid collections. Vascular/Lymphatic: No pathologically enlarged lymph nodes. New multiloculated fluid collections are seen in the retroperitoneum in the left paraaortic and right pericaval spaces, with extension into the inferior perinephric spaces and common iliac chains bilaterally. Largest collection in the inferior left perinephric space measures 6.1 x 4.8 cm. These are consistent with postoperative lymphoceles. Reproductive: Prior hysterectomy. Adnexal regions are unremarkable in appearance. No fluid collections seen within hysterectomy bed or elsewhere within the pelvis. Other:  None. Musculoskeletal:  No suspicious bone lesions identified. IMPRESSION: Multiloculated bilateral retroperitoneal fluid collections, consistent with postoperative lymphoceles. No evidence of hydronephrosis or other complication. Electronically Signed   By: Earle Gell M.D.   On: 04/29/2017 14:01   Ct Image Guided Drainage By Percutaneous Catheter  Result Date: 05/21/2017 CLINICAL DATA:  Endometrial adenocarcinoma status post hysterectomy and lymph node biopsy. Postop retroperitoneal fluid collections. Bilateral drain catheters have been placed. There is enlarging left retroperitoneal collection at the level of the aortic bifurcation and additional drainage requested. EXAM: CT GUIDED DRAINAGE OF LEFT RETROPERITONEAL ABSCESS ANESTHESIA/SEDATION: Intravenous Fentanyl and Versed were administered as conscious sedation during continuous monitoring of the patient's level of consciousness and physiological / cardiorespiratory status by the radiology RN, with a total moderate sedation time of 17 minutes. PROCEDURE: The procedure, risks, benefits, and alternatives were explained to the patient. Questions regarding the procedure were encouraged and answered. The patient understands and consents to the procedure. Patient placed prone. Select  axial scans through the lower abdomen  obtained an the left enlarging retroperitoneal collection was localized. An appropriate skin entry site was determined and marked. The operative field was prepped with chlorhexidinein a sterile fashion, and a sterile drape was applied covering the operative field. A sterile gown and sterile gloves were used for the procedure. Local anesthesia was provided with 1% Lidocaine. Under CT fluoroscopic guidance, a 18 gauge trocar needle was was advanced into the collection. Thin pale yellow fluid could be aspirated. Amplatz guidewire advanced easily within the collection, its position confirmed on CT. Tract dilated to facilitate placement of a 12 French pigtail drain catheter, formed centrally within the collection. CT confirms appropriate catheter position. A total of 50 mL thin pale yellow fluid were aspirated, sample sent for creatinine. The catheter was then secured externally with 0 Prolene suture and placed a bulb suction. The patient tolerated the procedure well. COMPLICATIONS: None immediate FINDINGS: The left retroperitoneal enlarging fluid collection was again localized. 12 French drain catheter placed as above. 50 mL pale thin yellow fluid were aspirated during the procedure, a sample sent for creatinine. IMPRESSION: 1. Technically successful left retroperitoneal drain catheter placement. PLAN: Continue daily flushing and bulb suction of all  3 drains. Follow-up in drain clinic in 2 weeks for CT and possible removal of 1 or more drains. Electronically Signed   By: Lucrezia Europe M.D.   On: 05/21/2017 12:11   Ct Image Guided Drainage By Percutaneous Catheter  Result Date: 05/07/2017 INDICATION: 67 year old female with a history of persisting seroma of the retroperitoneum. Right-sided tube was removed yesterday. Left-sided tube is dysfunctional. EXAM: CT-GUIDED PLACEMENT OF RIGHT RETROPERITONEAL FLUID COLLECTION DRAIN CT GUIDED EXCHANGE OF LEFT RETROPERITONEAL FLUID DRAIN MEDICATIONS: The patient is currently  admitted to the hospital and receiving intravenous antibiotics. The antibiotics were administered within an appropriate time frame prior to the initiation of the procedure. ANESTHESIA/SEDATION: 2.0 mg IV Versed 100 mcg IV Fentanyl Moderate Sedation Time:  20 minutes The patient was continuously monitored during the procedure by the interventional radiology nurse under my direct supervision. COMPLICATIONS: None immediate. TECHNIQUE: Informed written consent was obtained from the patient after a thorough discussion of the procedural risks, benefits and alternatives. All questions were addressed. Maximal Sterile Barrier Technique was utilized including caps, mask, sterile gowns, sterile gloves, sterile drape, hand hygiene and skin antiseptic. A timeout was performed prior to the initiation of the procedure. PROCEDURE: The bilateral flank including the indwelling left-sided drain was prepped with chlorhexidine in a sterile fashion, and a patient is position prone and CT imaging of the abdomen performed. Sterile drape was applied covering the operative field. A sterile gown and sterile gloves were used for the procedure. Local anesthesia was provided with 1% Lidocaine. Using modified Seldinger technique, a 10 [place] biliary drain was placed into the right-sided fluid collection. Modified Seldinger technique was then used to exchange the left-sided indwelling drain for a new 10 [place] biliary drain. Aspiration was performed of both fluid collection. Both drains were sutured in position and attached to bulb suction. Final image was stored. Patient tolerated the procedure well and remained hemodynamically stable throughout. No complications were encountered and no significant blood loss. Sample sent the lab from each side, for culture, triglycerides, creatinine. FINDINGS: Initial CT demonstrates residual fluid collection adjacent to the indwelling left drain. Fluid in the right-sided retroperitoneal region. Final image  demonstrates decompression of bilateral retroperitoneal fluid collection with the biliary drains in position. IMPRESSION: Status post CT guided exchange of left-sided  retroperitoneal drain for a new 10 [place] biliary drain, as well as CT-guided placement of a biliary drain into a right-sided persisting retroperitoneal fluid collection. Signed, Dulcy Fanny. Earleen Newport, DO [facility] Radiology Electronically Signed   By: Corrie Mckusick D.O.   On: 05/07/2017 17:17   Ct Image Guided Drainage By Percutaneous Catheter  Result Date: 05/07/2017 INDICATION: 67 year old female with a history of persisting seroma of the retroperitoneum. Right-sided tube was removed yesterday. Left-sided tube is dysfunctional. EXAM: CT-GUIDED PLACEMENT OF RIGHT RETROPERITONEAL FLUID COLLECTION DRAIN CT GUIDED EXCHANGE OF LEFT RETROPERITONEAL FLUID DRAIN MEDICATIONS: The patient is currently admitted to the hospital and receiving intravenous antibiotics. The antibiotics were administered within an appropriate time frame prior to the initiation of the procedure. ANESTHESIA/SEDATION: 2.0 mg IV Versed 100 mcg IV Fentanyl Moderate Sedation Time:  20 minutes The patient was continuously monitored during the procedure by the interventional radiology nurse under my direct supervision. COMPLICATIONS: None immediate. TECHNIQUE: Informed written consent was obtained from the patient after a thorough discussion of the procedural risks, benefits and alternatives. All questions were addressed. Maximal Sterile Barrier Technique was utilized including caps, mask, sterile gowns, sterile gloves, sterile drape, hand hygiene and skin antiseptic. A timeout was performed prior to the initiation of the procedure. PROCEDURE: The bilateral flank including the indwelling left-sided drain was prepped with chlorhexidine in a sterile fashion, and a patient is position prone and CT imaging of the abdomen performed. Sterile drape  was applied covering the operative field. A sterile gown and sterile gloves were used for the procedure. Local anesthesia was provided with 1% Lidocaine. Using modified Seldinger technique, a 10 [place] biliary drain was placed into the right-sided fluid collection. Modified Seldinger technique was then used to exchange the left-sided indwelling drain for a new 10 [place] biliary drain. Aspiration was performed of both fluid collection. Both drains were sutured in position and attached to bulb suction. Final image was stored. Patient tolerated the procedure well and remained hemodynamically stable throughout. No complications were encountered and no significant blood loss. Sample sent the lab from each side, for culture, triglycerides, creatinine. FINDINGS: Initial CT demonstrates residual fluid collection adjacent to the indwelling left drain. Fluid in the right-sided retroperitoneal region. Final image demonstrates decompression of bilateral retroperitoneal fluid collection with the biliary drains in position. IMPRESSION: Status post CT guided exchange of left-sided retroperitoneal drain for a new 10 [place] biliary drain, as well as CT-guided placement of a biliary drain into a right-sided persisting retroperitoneal fluid collection. Signed, Dulcy Fanny. Earleen Newport, DO [facility] Radiology Electronically Signed   By: Corrie Mckusick D.O.   On: 05/07/2017 17:17   Ct Image Guided Drainage By Percutaneous Catheter  Result Date: 04/30/2017 INDICATION: Bilateral abdominal retroperitoneal abscess EXAM: CT GUIDED DRAINAGE OF ABDOMINAL ABSCESS X2 MEDICATIONS: The patient is currently admitted to the hospital and receiving intravenous antibiotics. The antibiotics were administered within an appropriate time frame prior to the initiation of the procedure. ANESTHESIA/SEDATION: Two mg IV Versed 100 mcg IV Fentanyl Moderate Sedation Time:  25 minutes The patient was continuously  monitored during the procedure by the interventional radiology nurse under my direct supervision. COMPLICATIONS: None immediate. TECHNIQUE: Informed written consent was obtained from the patient after a thorough discussion of the procedural risks, benefits and alternatives. All questions were addressed. Maximal Sterile Barrier Technique was utilized including caps, mask, sterile gowns, sterile gloves, sterile drape, hand hygiene and skin antiseptic. A timeout was performed prior to the  initiation of the procedure. PROCEDURE: The operative field was prepped with back in the prone position in a sterile fashion, and a sterile drape was applied covering the operative field. A sterile gown and sterile gloves were used for the procedure. Local anesthesia was provided with 1% Lidocaine. Under CT guidance, an 18 gauge needle was inserted into the left retroperitoneal abdominal fluid collection and removed over an Amplatz wire. Ten [place] dilator followed by a 10 [place] drain were inserted. It was looped and string fixed then sewn to the skin. The identical procedure was performed for the right retroperitoneal abdominal fluid collection. Cloudy yellow fluid was aspirated from each cavity and sent for culture. FINDINGS: Imaging demonstrates placement of bilateral 10 French abdominal abscess drains. IMPRESSION: Successful abdominal abscess drain x2. Electronically Signed   By: Marybelle Killings M.D.   On: 04/30/2017 17:58   Ct Image Guided Drainage By Percutaneous Catheter  Result Date: 04/30/2017 INDICATION: Bilateral abdominal retroperitoneal abscess EXAM: CT GUIDED DRAINAGE OF ABDOMINAL ABSCESS X2 MEDICATIONS: The patient is currently admitted to the hospital and receiving intravenous antibiotics. The antibiotics were administered within an appropriate time frame prior to the initiation of the procedure. ANESTHESIA/SEDATION: Two mg IV Versed 100 mcg IV Fentanyl Moderate Sedation Time:  25 minutes The patient was continuously  monitored during the procedure by the interventional radiology nurse under my direct supervision. COMPLICATIONS: None immediate. TECHNIQUE: Informed written consent was obtained from the patient after a thorough discussion of the procedural risks, benefits and alternatives. All questions were addressed. Maximal Sterile Barrier Technique was utilized including caps, mask, sterile gowns, sterile gloves, sterile drape, hand hygiene and skin antiseptic. A timeout was performed prior to the initiation of the procedure. PROCEDURE: The operative field was prepped with back in the prone position in a sterile fashion, and a sterile drape was applied covering the operative field. A sterile gown and sterile gloves were used for the procedure. Local anesthesia was provided with 1% Lidocaine. Under CT guidance, an 18 gauge needle was inserted into the left retroperitoneal abdominal fluid collection and removed over an Amplatz wire. Ten [place] dilator followed by a 10 [place] drain were inserted. It was looped and string fixed then sewn to the skin. The identical procedure was performed for the right retroperitoneal abdominal fluid collection. Cloudy yellow fluid was aspirated from each cavity and sent for culture. FINDINGS: Imaging demonstrates placement of bilateral 10 French abdominal abscess drains. IMPRESSION: Successful abdominal abscess drain x2. Electronically Signed   By: Marybelle Killings M.D.   On: 04/30/2017 17:58   Ir Radiologist Eval & Mgmt  Result Date: 05/20/2017 Please refer to notes tab for details about interventional procedure. (Op Note)  Ir Radiologist Eval & Mgmt  Result Date: 05/18/2017 Please refer to notes tab for details about interventional procedure. (Op Note)  Ir Radiologist Eval & Mgmt  Result Date: 05/06/2017 Please refer to notes tab for details about interventional procedure. (Op Note)   All questions were answered. The patient knows to call the clinic with any problems, questions or  concerns. No barriers to learning was detected.  I spent 15 minutes counseling the patient face to face. The total time spent in the appointment was 20 minutes and more than 50% was on counseling and review of test results  Heath Lark, MD 05/26/2017 11:54 AM

## 2017-05-26 NOTE — Assessment & Plan Note (Signed)
She has severe postoperative pain She was prescribed MS Contin along with immediate release morphine without good success of controlling her pain I have increased the dose of morphine sulfate I warned her about the risk of sedation and constipation I will see her back next week for further assessment of pain control

## 2017-05-26 NOTE — Telephone Encounter (Signed)
Gave avs and calendar ° °

## 2017-05-26 NOTE — Assessment & Plan Note (Signed)
We discussed the role of adjuvant treatment Unfortunately, recent CT scan showed persistent fluid collection She has multiple drains in situ I will delay initiation of chemotherapy I plan to see her back next week after repeat CT scan to determine the start date of treatment We have rescheduled chemo education class I will continue to provide active supportive care

## 2017-05-26 NOTE — Assessment & Plan Note (Signed)
She has multiple fluid collection on recent imaging I would defer to radiologist for further assessment and management She is not ready to begin chemotherapy until all her drains are removed

## 2017-05-27 ENCOUNTER — Ambulatory Visit: Payer: Medicare Other

## 2017-05-27 ENCOUNTER — Telehealth: Payer: Self-pay

## 2017-05-27 NOTE — Telephone Encounter (Signed)
Received prior auth from [facility] outpatient pharmacy regarding MS Contin.  Called [facility] out pt pharmacy and was instructed to call pt's insurance [employer] [phone] and they faxed [place] necessary paper work to approve medication quantity for pt. Dr Alvy Bimler completed and I faxed back. Received approval and I contacted [facility] out pt pharmacy "Drue Dun" and she has received approval also and she will let pt know as well.

## 2017-05-27 NOTE — Progress Notes (Signed)
Prior auth for Morphine sulfate 30 mg has been submitted, and has been approved.

## 2017-05-29 ENCOUNTER — Ambulatory Visit: Payer: Medicare Other

## 2017-05-31 ENCOUNTER — Other Ambulatory Visit: Payer: Self-pay

## 2017-05-31 ENCOUNTER — Ambulatory Visit
Admission: RE | Admit: 2017-05-31 | Discharge: 2017-05-31 | Disposition: A | Discharge status: Home / Self Care | Payer: Medicare Other | Source: Ambulatory Visit | Attending: Radiation Oncology | Admitting: Radiation Oncology

## 2017-05-31 ENCOUNTER — Encounter: Payer: Self-pay | Admitting: Radiation Oncology

## 2017-05-31 ENCOUNTER — Telehealth: Payer: Self-pay | Admitting: Oncology

## 2017-05-31 ENCOUNTER — Inpatient Hospital Stay: Payer: Medicare Other

## 2017-05-31 VITALS — BP 127/78 | HR 83 | Temp 98.7°F | Resp 18 | Wt 214.5 lb

## 2017-05-31 DIAGNOSIS — C541 Malignant neoplasm of endometrium: Secondary | ICD-10-CM

## 2017-05-31 NOTE — Progress Notes (Signed)
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 Vagina, biopsy - ATYPICAL CELLS, SUSPICIOUS BUT NOT DIAGNOSTIC OF MALIGNANCY. SEE NOTE 2. Lymph node, sentinel, biopsy, right presacral #1 - LYMPH NODE, NEGATIVE FOR MALIGNANCY (0/1) - IMMUNOSTAIN FOR PANKERATIN SHOWS FOCAL NONSPECIFIC LABELING 3. Lymph node, sentinel, biopsy, right presacral #2 - LYMPH NODE, NEGATIVE FOR MALIGNANCY (0/1) - IMMUNOSTAIN FOR PANKERATIN IS NEGATIVE 4. Lymph node, sentinel, biopsy, left external iliac - LYMPH NODE, NEGATIVE FOR MALIGNANCY (0/1) - IMMUNOSTAIN FOR PANKERATIN IS NEGATIVE 5. Lymph node, biopsy, right periaortic - TWO LYMPH NODES, NEGATIVE FOR MALIGNANCY (0/2) 6. Lymph node, biopsy, left periaortic - SEVEN LYMPH NODES, NEGATIVE FOR MALIGNANCY (0/7) 7. 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 SEE NOTE - TUMOR INVADES A LITTLE OVER HALF OF THE MYOMETRIUM (INVASION DEPTH OF 0.7 CM WHERE MYOMETRIUM IS 1.3 CM THICK) - SURGICAL MARGINS ARE NEGATIVE FOR MALIGNANCY - NEGATIVE FOR LYMPHOVASCULAR INVASION - NEGATIVE FOR CERVICAL INVOLVEMENT - LEFT OVARY WITH BENIGN SEROUS CYST AND ENDOSALPINGIOSIS - DEFINITIVE LEFT FALLOPIAN TUBE IS NOT IDENTIFIED  Past/Anticipated interventions by Gyn/Onc surgery, if any: 04/15/17 - Procedure: XI ROBOTIC ASSISTED TOTAL LAPROSCOPIC  HYSTERECTOMY WITH LEFT SALPINGO OOPHORECTOMY;  Surgeon: Everitt Amber, MD   Past/Anticipated interventions by medical oncology, if any:  Apt with Dr. Alvy Bimler 05/26/17--Dr. Alvy Bimler wants to delay starting chemo until seeing the results from most recent CT scan  Weight changes, if any: Yes; patient states she's lost about 15 lbs since  diagnosis.  Bowel/Bladder complaints, if any: Yes. Stress incontinence and urgency. Taking Senna PRN after surgery to prevent constipation from pain medicine.  Nausea/Vomiting, if any: Vomited today for the first time since surgery, otherwise neither have been an issue.  Pain issues, if any:  8 out of 10 pain at surgical sites on left and right flanks where JP drains are.  SAFETY ISSUES:  Prior radiation? No  Pacemaker/ICD? No  Possible current pregnancy? No  Is the patient on methotrexate? No  Current Complaints / other details:  Patient presents today for radiation consult. Dr. Denman George is recommending chemotherapy with carb/tax x 6 cycles and vaginal brachy. Currently patient's main concern is the constant pain/discomfort she's experiencing at the entry points where the JP drains are (2 on the left flank, and 1 on the right flank). She will see Dr. Denman George on 06/03/17 for another evaluation discuss removal.

## 2017-05-31 NOTE — Progress Notes (Signed)
Radiation Oncology         [phone] ________________________________  Initial Outpatient Consultation  Name: Katie Palmer MRN: [id number]  Date: 05/31/2017  DOB: 1950/09/08  CC:Fry, Ishmael Holter, MD  Everitt Amber, MD   REFERRING PHYSICIAN: Everitt Amber, MD  DIAGNOSIS: Stage IB (pT1b, pN0, cM0) carcinosarcoma of the uterus  HISTORY OF PRESENT ILLNESS::Katie Palmer is a 67 y.o. female who is here at the request of Dr. Denman George to discuss adjuvant radiotherapy for her endometrial cancer. She underwent dilatation and curettage and right salpingo oophorectomy for RLQ pain and thickened endometrium on 12/25/2015 which did not demonstrate any evidence of malignancy at that time. She then presented with postmenopausal bleeding in December 2018 and was evaluated with Pap smear that showed AGUS. Endometrial biopsy on 03/17/17 revealed grade 3 endometrial adenocarcinoma. CT abdomen on 03/26/2017 showed diffuse endometrial thickening consistent with endometrial carcinoma with probable focal area of deep myometrial invasion in the anterior fundus. No evidence of extra uterine tumor extension, lymphadenopathy, or distant metastatic disease.   She underwent total hysterectomy and left salpingo oophorectomy with sentinel lymph biopsy, bilateral para-aortic lymphadenectomy, and vaginal biopsy on 04/15/2017. Final pathology revealed stage pT1b carcinosarcoma involving the entire endometrium and lower uterine segments. Tumor invades a little over half of the myometrium (invasion depth of 0.7 cm where myometrium is 1.3 cm thick). Vaginal biopsy revealed atypical cells, suspicious but not diagnostic of malignancy. All lymph nodes were negative for malignancy.  She presented on 04/29/2017 with severe left pelvic and left leg pain. She was evaluated with CT of the abdomen that showed multiloculated bilateral retroperitoneal fluid collections, consistent with postoperative lymphoceles. Drains were placed, 2 on the left flank  and 1 on the right. She was discharged on 05/02/2017 and met with Dr. Alvy Bimler on 05/03/2017 to discuss adjuvant treatment options. She was recommended to have adjuvant carb/tax chemotherapy and vaginal brachytherapy after discussion with Dr. Denman George. Initiation of chemotherapy has been delayed due to persistent fluid collection status post 3 drainages. She has another CT of the abdomen scheduled for Thursday. She is not ready to begin any treatment until all her drains are removed.  On review of systems, the patient reports a 15 pound weight loss since diagnosis. She reports having Type I diabetes mellitus for which she uses an insulin pump. She reports stress incontinence and urinary urgency. She reports constant 8/10 pelvic pain, especiallyy at the surgical sites on the left and right flanks where her JP drains are (2 on the left flank and 1 on the right flank). The pain makes it difficult for her to ambulate. She was prescribed MS Contin along with immediate release morphine without good success of controlling her pain. Dr. Alvy Bimler has increased the dose of morphine sulfate. She has been taking Senna prn since surgery to prevent constipation from pain medication. She reports one episode of vomiting today. Marland Kitchen She will see Dr. Denman George on 06/03/2017 for another evaluation and to discuss removal.   PREVIOUS RADIATION THERAPY: No  PAST MEDICAL HISTORY:  has a past medical history of Depression, Fibromyalgia, Headache(784.0), History of colon polyps, History of diabetes with ketoacidosis, colonic polyps, Hypertension, Insomnia, Insulin pump in place, RBBB (right bundle branch block), RLQ abdominal pain, Thickened endometrium, Type 1 diabetes mellitus with long-term current use of insulin ([place]) (endocrinologist-- dr Chalmers Cater), and Vertigo.    PAST SURGICAL HISTORY: Past Surgical History:  Procedure Laterality Date  . CARDIOVASCULAR STRESS TEST  07/26/2015   normal myocardial perfusion study w/ no  ischemia/  normal LV  function and wall motion , ef 71%  . COLONOSCOPY WITH PROPOFOL  last one 10/ 2017  . HYSTEROSCOPY W/D&C N/A 12/25/2015   Procedure: DILATATION AND CURETTAGE /HYSTEROSCOPY;  Surgeon: Bobbye Charleston, MD;  Location: [facility];  Service: Gynecology;  Laterality: N/A;  . IR RADIOLOGIST EVAL & MGMT  05/06/2017  . IR RADIOLOGIST EVAL & MGMT  05/18/2017  . IR RADIOLOGIST EVAL & MGMT  05/13/2017  . LAPAROSCOPIC LYSIS OF ADHESIONS N/A 12/25/2015   Procedure: LAPAROSCOPIC LYSIS OF ADHESIONS;  Surgeon: Bobbye Charleston, MD;  Location: [facility];  Service: Gynecology;  Laterality: N/A;  . LAPAROSCOPIC UNILATERAL SALPINGO OOPHERECTOMY Right 12/25/2015   Procedure: LAPAROSCOPIC UNILATERAL SALPINGO OOPHORECTOMY;  Surgeon: Bobbye Charleston, MD;  Location: [facility];  Service: Gynecology;  Laterality: Right;  . LAPAROSCOPY N/A 12/25/2015   Procedure: LAPAROSCOPY OPERATIVE;  Surgeon: Bobbye Charleston, MD;  Location: [facility];  Service: Gynecology;  Laterality: N/A;  . LYMPH NODE BIOPSY N/A 04/15/2017   Procedure: SENTINEL LYMPH NODE BIOPSY, PERIAORTIC LYMPH NODES, AND VAGINAL BIOPSY;  Surgeon: Everitt Amber, MD;  Location: [facility];  Service: Gynecology;  Laterality: N/A;  . ROBOTIC ASSISTED TOTAL HYSTERECTOMY WITH BILATERAL SALPINGO OOPHERECTOMY Bilateral 04/15/2017   Procedure: XI ROBOTIC ASSISTED TOTAL LAPROSCOPIC  HYSTERECTOMY WITH LEFT SALPINGO OOPHORECTOMY;  Surgeon: Everitt Amber, MD;  Location: [facility];  Service: Gynecology;  Laterality: Bilateral;  . TONSILLECTOMY  age 32  . TRANSTHORACIC ECHOCARDIOGRAM  07/26/2015   grade 1 diastolic dysfunction , ef 60-65%/  trivial TR    FAMILY HISTORY: family history includes Alcohol abuse in her father; Aneurysm in her mother; COPD in her sister; Dementia in her maternal uncle; Heart disease in her father; Heart failure in her sister; Leukemia in her brother; Rheum arthritis in her sister.  SOCIAL HISTORY:   reports that she has never smoked. She has never used smokeless tobacco. She reports that she does not drink alcohol or use drugs.  ALLERGIES: Armodafinil; Invokana [canagliflozin]; Lasix [furosemide]; and Adhesive [tape]  MEDICATIONS:  Current Outpatient Medications  Medication Sig Dispense Refill  . Acetaminophen (TYLENOL PO) Take 1 tablet by mouth every 6 (six) hours as needed (pain/headache).    . ALPRAZolam (XANAX) 1 MG tablet TAKE 1/2 TO 1 TABLET BY MOUTH AT BEDTIME AS NEEDED FOR ANXIETY (Patient taking differently: TAKE 1/2 TO 1 TABLET (0.5 - 1 MG)  BY MOUTH AT BEDTIME AS NEEDED FOR ANXIETY) 30 tablet 5  . DULoxetine (CYMBALTA) 30 MG capsule Take 90 mg by mouth at bedtime.   2  . ezetimibe (ZETIA) 10 MG tablet Take 10 mg by mouth at bedtime.   12  . gabapentin (NEURONTIN) 100 MG capsule Take 200 mg two times a day (Patient taking differently: 300 mg 2 (two) times daily. Take 200 mg two times a day) 120 capsule 0  . gabapentin (NEURONTIN) 400 MG capsule Take 1 capsule (400 mg total) by mouth at bedtime. (Patient taking differently: Take 600 mg by mouth at bedtime. ) 30 capsule 0  . hydrocortisone cream 1 % Apply topically 2 (two) times daily. (Patient taking differently: Apply 1 application topically 2 (two) times daily as needed for itching. ) 30 g 0  . Insulin Human (INSULIN PUMP) SOLN Inject into the skin continuous. Use with Novolog    . losartan-hydrochlorothiazide (HYZAAR) 50-12.5 MG tablet TAKE 1 TABLET BY MOUTH EVERY DAY 90 tablet 0  . morphine (MS CONTIN) 30 MG 12 hr tablet  Take 1 tablet (30 mg total) by mouth every 12 (twelve) hours. 60 tablet 0  . morphine (MSIR) 30 MG tablet Take 1 tablet (30 mg total) by mouth every 4 (four) hours as needed for severe pain. 90 tablet 0  . senna (SENOKOT) 8.6 MG TABS tablet Take 1 tablet (8.6 mg total) by mouth at bedtime. 120 each 0  . SUMAtriptan (IMITREX) 100 MG tablet TAKE 1 TABLET BY MOUTH AS NEEDED FOR MIGRAINES. MAY REPEAT IN 2 HOURS IF  NEEDED (Patient taking differently: Take 100 mg by mouth every 2 (two) hours as needed for migraine. ) 30 tablet 5  . traMADol (ULTRAM) 50 MG tablet TAKE 2 TABLETS BY MOUTH EVERY 4 HOURS AS NEEDED (Patient taking differently: TAKE 1 TABLET (50 MG) BY MOUTH EVERY 4 HOURS AS NEEDED FOR PAIN) 360 tablet 1  . traZODone (DESYREL) 100 MG tablet Take 100 mg by mouth at bedtime.   1  . Vitamin D, Ergocalciferol, (DRISDOL) 50000 units CAPS capsule Take 50,000 Units by mouth every Sunday.    Marland Kitchen glucagon (GLUCAGON EMERGENCY) 1 MG injection Inject 1 mg into the muscle once as needed (low blood sugar).     No current facility-administered medications for this encounter.     REVIEW OF SYSTEMS:  REVIEW OF SYSTEMS: A 10+ POINT REVIEW OF SYSTEMS WAS OBTAINED including neurology, dermatology, psychiatry, cardiac, respiratory, lymph, extremities, GI, GU, musculoskeletal, constitutional, reproductive, HEENT. All pertinent positives are noted in the HPI. All others are negative.   PHYSICAL EXAM:  weight is 214 lb 8 oz (97.3 kg). Her temperature is 98.7 F (37.1 C). Her blood pressure is 127/78 and her pulse is 83. Her respiration is 18 and oxygen saturation is 98%.   General: Alert and oriented, tearful. HEENT: Head is normocephalic. Extraocular movements are intact. Oropharynx is clear. Neck: Neck is supple, no palpable cervical or supraclavicular lymphadenopathy. Heart: Regular in rate and rhythm with no murmurs, rubs, or gallops. Chest: Clear to auscultation bilaterally, with no rhonchi, wheezes, or rales. Abdomen: Soft, nontender, nondistended, with no rigidity or guarding. Extremities: No cyanosis or edema. Distal pulses intact. Lymphatics: 3 drains in place along the lower back/upper buttocks region. Skin: No concerning lesions. Musculoskeletal: motor strength 5 out of 5 in the lower extremities. Some left hip pain with raising her left leg. Pelvic examination deferred until simulation and planning  day  ECOG = 1    LABORATORY DATA:  Lab Results  Component Value Date   WBC 10.3 05/21/2017   HGB 12.8 05/21/2017   HCT 38.8 05/21/2017   MCV 87.2 05/21/2017   PLT 368 05/21/2017   NEUTROABS 4.1 10/29/2016   Lab Results  Component Value Date   NA 135 05/21/2017   K 4.1 05/21/2017   CL 96 (L) 05/21/2017   CO2 25 05/21/2017   GLUCOSE 224 (H) 05/21/2017   CREATININE 0.94 05/21/2017   CALCIUM 8.9 05/21/2017      RADIOGRAPHY: Ct Abdomen Pelvis W Contrast  Result Date: 05/18/2017 CLINICAL DATA:  Follow-up retroperitoneal drainage EXAM: CT ABDOMEN AND PELVIS WITH CONTRAST TECHNIQUE: Multidetector CT imaging of the abdomen and pelvis was performed using the standard protocol following bolus administration of intravenous contrast. CONTRAST:  153m ISOVUE-300 IOPAMIDOL (ISOVUE-300) INJECTION 61% COMPARISON:  05/06/2017 FINDINGS: Lower chest: Dependent atelectasis Hepatobiliary: Nonspecific mild gallbladder wall thickening. No liver lesion. Pancreas: Unremarkable Spleen: Unremarkable Adrenals/Urinary Tract: Stable mild hydronephrosis. Adrenal glands are unremarkable. Bladder decompressed. Stomach/Bowel: Moderate stool burden throughout the colon. No evidence of small-bowel obstruction. Normal  appendix. Stomach is distended with enteric contents. Vascular/Lymphatic: No evidence of aortic aneurysm. Reproductive: Post hysterectomy Other: There has been further improvement regarding the retroperitoneal fluid collections. The right drain has been repositioned into the more central retroperitoneal fluid collection. The perinephric fluid collection on the right has resolved. Today, the fluid collection layering over the right psoas muscle measures 2.3 x 3.4 cm which is improved. The drain is coiled within this fluid collection. On the left, the more lateral fluid collection has been drained and is now resolved. There is a separate more anterior fluid collection which is larger and today measures 4.4 x 4.5  cm. The drain is adjacent to this fluid collection. There is stranding surrounding the lower abdominal aorta and within the retroperitoneum likely due to inflammatory change. This has increased since the prior study. Musculoskeletal: No vertebral compression deformity. Degenerative changes. IMPRESSION: Right retroperitoneal fluid collections are improved. The right drain has been repositioned into the more anterior fluid collection which is smaller than on the prior study. The lateral left retroperitoneal fluid collection has resolved after drainage. The more medial and anterior fluid collection has increased in size. The drain is not coiled in this more medial fluid collection. Electronically Signed   By: Marybelle Killings M.D.   On: 05/18/2017 13:12   Ct Abdomen Pelvis W Contrast  Result Date: 05/06/2017 CLINICAL DATA:  History of hysterectomy and BSO and lymph node dissection for endometrial carcinoma who presented to the emergency department on 04/29/2017 with abdominal pain and subsequent CT scan demonstrating development of bilateral retroperitoneal fluid collections concerning for postoperative seromas and/or abscesses. As such, patient underwent bilateral CT-guided percutaneous drainage catheter placement on 04/30/2017 by Dr. Barbie Banner. Patient presents today to the interventional radiology drain Clinic for drainage catheter evaluation and management. EXAM: CT ABDOMEN AND PELVIS WITH CONTRAST TECHNIQUE: Multidetector CT imaging of the abdomen and pelvis was performed using the standard protocol following bolus administration of intravenous contrast. CONTRAST:  130m ISOVUE-300 IOPAMIDOL (ISOVUE-300) INJECTION 61% COMPARISON:  CT abdomen pelvis-04/29/2017; 03/26/2017; CT-guided bilateral retroperitoneal drainage catheter placement-04/30/2017 FINDINGS: Lower chest: Limited visualization of the lower thorax demonstrates unchanged punctate (approximately 0.4 cm) nodules within the right lower (image 4, series 4 and  left lower (image 11, series 4) lobes. Minimal subsegmental atelectasis within the medial basilar aspect of the left lower lobe. No focal airspace opacities. No pleural effusion. Normal heart size.  No pericardial effusion. Hepatobiliary: Normal hepatic contour. There is a minimal amount of focal fatty infiltration adjacent to the fissure for ligamentum teres. No discrete hepatic lesions. Normal appearance of the gallbladder given degree distention. No radiopaque gallstones. Grossly unchanged very mild centralized intrahepatic biliary duct dilatation. No extrahepatic bili duct dilatation. No ascites. Pancreas: The pancreas remains slightly atrophic. Spleen: Normal appearance of the spleen. Adrenals/Urinary Tract: There is symmetric enhancement of the bilateral kidneys. No renal stones in this postcontrast examination. No discrete renal lesions. No urinary obstruction or perinephric stranding. Normal appearance the bilateral adrenal glands. Normal appearance of the urinary bladder given underdistention. Stomach/Bowel: Moderate colonic stool burden without evidence of enteric obstruction. The cecum is noted to be located within the right mid abdomen. Normal appearance of the terminal ileum. The appendix is not visualized, however there is no pericecal inflammatory change. No pneumoperitoneum, pneumatosis or portal venous gas. Vascular/Lymphatic: Normal caliber the abdominal aorta. The major branch vessels of the abdominal aorta appear patent on this non CTA examination. No bulky retroperitoneal, mesenteric, pelvic or inguinal lymphadenopathy. Reproductive: Post hysterectomy and bilateral oophorectomy. No  free fluid the pelvic cul-de-sac. Other: Right-sided retroperitoneal percutaneous drainage catheter has been retracted is currently located outside the residual right retroperitoneal fluid collection the right-sided retroperitoneal fluid collection is grossly unchanged in size measuring approximately 10.5 x 4.8 x 9.7  cm (axial image 43, series 2, coronal image 77, series 6, previously, 9.3 x 3.5 x 9.6 cm. The right-sided retroperitoneal drainage catheter is located within the peripheral aspect of the left retroperitoneal fluid collection and the left-sided fluid collection is also grossly unchanged in size measuring approximately 9.6 x 5.4 x 13.1 cm (axial image 38, series 2, coronal image 81, series 6 previously, 9.5 x 4.8 x 14.1 cm. Musculoskeletal: No acute or aggressive osseous abnormalities. Stigmata of DISH within thoracic spine. Grade 1 anterolisthesis of L4 upon L5 measuring approximately 5 mm without associated pars defects, unchanged. Bilateral degenerative change of the lower lumbar spine. IMPRESSION: 1. Interval retraction of right-sided percutaneous drainage catheter with end now coiled and locked peripheral the grossly unchanged right-sided retroperitoneal fluid collection, presumed postoperative seroma. 2. Unchanged positioning of left-sided percutaneous drainage catheter with end coiled and locked within the peripheral aspect of the grossly unchanged left-sided retroperitoneal fluid collection, presumed postoperative seroma. PLAN: - The malpositioned right-sided retroperitoneal drainage catheter was removed at the patient's bedside without incident. - Above discussed with both Dr. Alvy Bimler and [name] and the decision was made to proceed with CT-guided replacement of the right-sided retroperitoneal percutaneous drainage catheter and exchange/up sizing of the left-sided retroperitoneal drainage catheter. This will be performed at an outpatient basis tomorrow [place] [facility] Dr. Earleen Newport. Electronically Signed   By: Sandi Mariscal M.D.   On: 05/06/2017 15:25   Ct Image Guided Drainage By Percutaneous Catheter  Result Date: 05/21/2017 CLINICAL DATA:  Endometrial adenocarcinoma status post hysterectomy and lymph node biopsy. Postop retroperitoneal fluid collections. Bilateral drain catheters have been placed.  There is enlarging left retroperitoneal collection at the level of the aortic bifurcation and additional drainage requested. EXAM: CT GUIDED DRAINAGE OF LEFT RETROPERITONEAL ABSCESS ANESTHESIA/SEDATION: Intravenous Fentanyl and Versed were administered as conscious sedation during continuous monitoring of the patient's level of consciousness and physiological / cardiorespiratory status by the radiology RN, with a total moderate sedation time of 17 minutes. PROCEDURE: The procedure, risks, benefits, and alternatives were explained to the patient. Questions regarding the procedure were encouraged and answered. The patient understands and consents to the procedure. Patient placed prone. Select axial scans through the lower abdomen obtained an the left enlarging retroperitoneal collection was localized. An appropriate skin entry site was determined and marked. The operative field was prepped with chlorhexidinein a sterile fashion, and a sterile drape was applied covering the operative field. A sterile gown and sterile gloves were used for the procedure. Local anesthesia was provided with 1% Lidocaine. Under CT fluoroscopic guidance, a 18 gauge trocar needle was was advanced into the collection. Thin pale yellow fluid could be aspirated. Amplatz guidewire advanced easily within the collection, its position confirmed on CT. Tract dilated to facilitate placement of a 12 French pigtail drain catheter, formed centrally within the collection. CT confirms appropriate catheter position. A total of 50 mL thin pale yellow fluid were aspirated, sample sent for creatinine. The catheter was then secured externally with 0 Prolene suture and placed a bulb suction. The patient tolerated the procedure well. COMPLICATIONS: None immediate FINDINGS: The left retroperitoneal enlarging fluid collection was again localized. 12 French drain catheter placed as above. 50 mL pale thin yellow fluid were aspirated during the procedure, a  sample  sent for creatinine. IMPRESSION: 1. Technically successful left retroperitoneal drain catheter placement. PLAN: Continue daily flushing and bulb suction of all  3 drains. Follow-up in drain clinic in 2 weeks for CT and possible removal of 1 or more drains. Electronically Signed   By: Lucrezia Europe M.D.   On: 05/21/2017 12:11   Ct Image Guided Drainage By Percutaneous Catheter  Result Date: 05/07/2017 INDICATION: 67 year old female with a history of persisting seroma of the retroperitoneum. Right-sided tube was removed yesterday. Left-sided tube is dysfunctional. EXAM: CT-GUIDED PLACEMENT OF RIGHT RETROPERITONEAL FLUID COLLECTION DRAIN CT GUIDED EXCHANGE OF LEFT RETROPERITONEAL FLUID DRAIN MEDICATIONS: The patient is currently admitted to the hospital and receiving intravenous antibiotics. The antibiotics were administered within an appropriate time frame prior to the initiation of the procedure. ANESTHESIA/SEDATION: 2.0 mg IV Versed 100 mcg IV Fentanyl Moderate Sedation Time:  20 minutes The patient was continuously monitored during the procedure by the interventional radiology nurse under my direct supervision. COMPLICATIONS: None immediate. TECHNIQUE: Informed written consent was obtained from the patient after a thorough discussion of the procedural risks, benefits and alternatives. All questions were addressed. Maximal Sterile Barrier Technique was utilized including caps, mask, sterile gowns, sterile gloves, sterile drape, hand hygiene and skin antiseptic. A timeout was performed prior to the initiation of the procedure. PROCEDURE: The bilateral flank including the indwelling left-sided drain was prepped with chlorhexidine in a sterile fashion, and a patient is position prone and CT imaging of the abdomen performed. Sterile drape was applied covering the operative field. A sterile gown and sterile gloves were used for the procedure. Local anesthesia was provided with 1% Lidocaine. Using modified Seldinger  technique, a 10 [place] biliary drain was placed into the right-sided fluid collection. Modified Seldinger technique was then used to exchange the left-sided indwelling drain for a new 10 [place] biliary drain. Aspiration was performed of both fluid collection. Both drains were sutured in position and attached to bulb suction. Final image was stored. Patient tolerated the procedure well and remained hemodynamically stable throughout. No complications were encountered and no significant blood loss. Sample sent the lab from each side, for culture, triglycerides, creatinine. FINDINGS: Initial CT demonstrates residual fluid collection adjacent to the indwelling left drain. Fluid in the right-sided retroperitoneal region. Final image demonstrates decompression of bilateral retroperitoneal fluid collection with the biliary drains in position. IMPRESSION: Status post CT guided exchange of left-sided retroperitoneal drain for a new 10 [place] biliary drain, as well as CT-guided placement of a biliary drain into a right-sided persisting retroperitoneal fluid collection. Signed, Dulcy Fanny. Earleen Newport, DO [facility] Electronically Signed   By: Corrie Mckusick D.O.   On: 05/07/2017 17:17   Ct Image Guided Drainage By Percutaneous Catheter  Result Date: 05/07/2017 INDICATION: 67 year old female with a history of persisting seroma of the retroperitoneum. Right-sided tube was removed yesterday. Left-sided tube is dysfunctional. EXAM: CT-GUIDED PLACEMENT OF RIGHT RETROPERITONEAL FLUID COLLECTION DRAIN CT GUIDED EXCHANGE OF LEFT RETROPERITONEAL FLUID DRAIN MEDICATIONS: The patient is currently admitted to the hospital and receiving intravenous antibiotics. The antibiotics were administered within an appropriate time frame prior to the initiation of the procedure. ANESTHESIA/SEDATION: 2.0 mg IV Versed 100 mcg IV Fentanyl Moderate Sedation Time:  20 minutes The patient was  continuously monitored during the procedure by the interventional radiology nurse under my direct supervision. COMPLICATIONS: None immediate. TECHNIQUE: Informed written consent was obtained from the patient after a thorough discussion of the procedural risks, benefits and alternatives.  All questions were addressed. Maximal Sterile Barrier Technique was utilized including caps, mask, sterile gowns, sterile gloves, sterile drape, hand hygiene and skin antiseptic. A timeout was performed prior to the initiation of the procedure. PROCEDURE: The bilateral flank including the indwelling left-sided drain was prepped with chlorhexidine in a sterile fashion, and a patient is position prone and CT imaging of the abdomen performed. Sterile drape was applied covering the operative field. A sterile gown and sterile gloves were used for the procedure. Local anesthesia was provided with 1% Lidocaine. Using modified Seldinger technique, a 10 [place] biliary drain was placed into the right-sided fluid collection. Modified Seldinger technique was then used to exchange the left-sided indwelling drain for a new 10 [place] biliary drain. Aspiration was performed of both fluid collection. Both drains were sutured in position and attached to bulb suction. Final image was stored. Patient tolerated the procedure well and remained hemodynamically stable throughout. No complications were encountered and no significant blood loss. Sample sent the lab from each side, for culture, triglycerides, creatinine. FINDINGS: Initial CT demonstrates residual fluid collection adjacent to the indwelling left drain. Fluid in the right-sided retroperitoneal region. Final image demonstrates decompression of bilateral retroperitoneal fluid collection with the biliary drains in position. IMPRESSION: Status post CT guided exchange of left-sided retroperitoneal drain for a new 10 [place] biliary drain, as well as CT-guided placement of a biliary drain into a  right-sided persisting retroperitoneal fluid collection. Signed, Dulcy Fanny. Earleen Newport, DO [facility] Radiology Electronically Signed   By: Corrie Mckusick D.O.   On: 05/07/2017 17:17   Ir Radiologist Eval & Mgmt  Result Date: 05/20/2017 Please refer to notes tab for details about interventional procedure. (Op Note)  Ir Radiologist Eval & Mgmt  Result Date: 05/18/2017 Please refer to notes tab for details about interventional procedure. (Op Note)  Ir Radiologist Eval & Mgmt  Result Date: 05/06/2017 Please refer to notes tab for details about interventional procedure. (Op Note)     IMPRESSION: Stage IB (pT1b, pN0, cM0) carcinosarcoma of the uterus, s/p staging surgery on 4/66/5993 complicated by postop bilateral lymphocysts. The patient would be at risk for vaginal cuff recurrence given her pathologic findings. The patient would likely benefit from a course of adjuvant vaginal brachytherapy to reduce risk for local recurrence. We discussed the general course of radiation with regards to the benefits, potential side effects, and toxicities associated with radiation. The patient appears to understand and would like to proceed with this course of treatment. We discussed and signed the radiation oncology consent form, and a copy was retained for our records.  PLAN: The patient has another follow-up CT abdomen scheduled for Thursday and will see Dr. Denman George to discuss removal of her drains. She is tentatively scheduled for her first cycle of chemotherapy on 06/17/2017. We will plan to do her CT simulation appointment after this to begin radiation treatment during her second or third  cycle of chemotherapy.     ------------------------------------------------  Blair Promise, PhD, MD  This document serves as a record of services personally performed by Gery Pray, MD. It was created on his behalf by Rae Lips, a trained medical scribe. The creation of  this record is based on the scribe's personal observations and the provider's statements to them. This document has been checked and approved by the attending provider.

## 2017-05-31 NOTE — Telephone Encounter (Signed)
Left message for patient letting her know that her chemo class has been rescheduled for Friday, 06/04/17 at 2 pm.

## 2017-06-02 ENCOUNTER — Telehealth: Payer: Self-pay | Admitting: Oncology

## 2017-06-02 NOTE — Telephone Encounter (Signed)
Called patient to advise her of chemo education class on Friday, 5/10.  She asked if it can be moved to next week because she is not starting chemotherapy until 06/17/17.  Message sent to scheduling.  Patient also said she continues to have pain due to the driains.  She is taking Morphine as directed and will have a CT scan tomorrow.

## 2017-06-03 ENCOUNTER — Encounter: Payer: Self-pay | Admitting: Radiology

## 2017-06-03 ENCOUNTER — Ambulatory Visit
Admission: RE | Admit: 2017-06-03 | Discharge: 2017-06-03 | Disposition: A | Discharge status: Home / Self Care | Payer: Medicare Other | Source: Ambulatory Visit | Attending: Hematology and Oncology | Admitting: Hematology and Oncology

## 2017-06-03 ENCOUNTER — Ambulatory Visit: Payer: Medicare Other

## 2017-06-03 ENCOUNTER — Other Ambulatory Visit: Payer: Self-pay | Admitting: Hematology and Oncology

## 2017-06-03 ENCOUNTER — Other Ambulatory Visit: Payer: Medicare Other

## 2017-06-03 ENCOUNTER — Ambulatory Visit
Admission: RE | Admit: 2017-06-03 | Discharge: 2017-06-03 | Disposition: A | Discharge status: Home / Self Care | Payer: Medicare Other | Source: Ambulatory Visit | Attending: Radiology | Admitting: Radiology

## 2017-06-03 DIAGNOSIS — N739 Female pelvic inflammatory disease, unspecified: Secondary | ICD-10-CM

## 2017-06-03 HISTORY — PX: IR RADIOLOGIST EVAL & MGMT: IMG5224

## 2017-06-03 MED ORDER — IOPAMIDOL (ISOVUE-300) INJECTION 61%
100.0000 mL | Freq: Once | INTRAVENOUS | Status: AC | PRN
  Administered 2017-06-03: 100 mL via INTRAVENOUS

## 2017-06-03 NOTE — Progress Notes (Signed)
Referring Physician(s): Dr. Denman George  Chief Complaint: The patient is seen in follow up today s/p intra-abdominal fluid collections  History of present illness: Katie Palmer is a 67 year old female with past medical history of robotic-assisted laparoscopic total hysterectomy with left salpingo-oophorectomy, sentinel lymph node biopsy, bilateral periaortic lymphadenectomy and 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 She subsequently developed intra-abdominal fluid collections and had a left and right abdominal drain placed 04/30/17.  The left drain required exchange and right drain replaced.  Ultimately she also required a third drain placed on the right 05/21/17.   She presents to clinic today for follow-up.  She states she has been flushing her drains, but admits this is difficult due to the drains all being transgluteal.  Two drains are connected to bulb suction, 1 drain is left unconnected to bag or bulb, and is uncapped.  She believes it has been this way since her last clinic visit.  She states the drains have all had minimal/trace amounts of drainage that appears thin and clear. She denies fevers, chills, abdominal pain, nausea, back pain.   Past Medical History:  Diagnosis Date  . Depression   . Fibromyalgia   . Headache(784.0)   . History of colon polyps   . History of diabetes with ketoacidosis   . Hx of colonic polyps   . Hypertension   . Insomnia   . Insulin pump in place   . RBBB (right bundle branch block)   . RLQ abdominal pain   . Thickened endometrium   . Type 1 diabetes mellitus with long-term current use of insulin [facility]) endocrinologist-- dr Chalmers Cater   July 2015--  pt took Invokana and caused complete pancreas shut-down , pt went from pre-diabetic to adult type 1 dm now insulin dependent  . Vertigo     Past Surgical History:  Procedure Laterality Date  . CARDIOVASCULAR STRESS TEST  07/26/2015   normal myocardial perfusion study w/ no ischemia/  normal LV  function and wall motion , ef 71%  . COLONOSCOPY WITH PROPOFOL  last one 10/ 2017  . HYSTEROSCOPY W/D&C N/A 12/25/2015   Procedure: DILATATION AND CURETTAGE /HYSTEROSCOPY;  Surgeon: Bobbye Charleston, MD;  Location: [facility];  Service: Gynecology;  Laterality: N/A;  . IR RADIOLOGIST EVAL & MGMT  05/06/2017  . IR RADIOLOGIST EVAL & MGMT  05/18/2017  . IR RADIOLOGIST EVAL & MGMT  05/13/2017  . LAPAROSCOPIC LYSIS OF ADHESIONS N/A 12/25/2015   Procedure: LAPAROSCOPIC LYSIS OF ADHESIONS;  Surgeon: Bobbye Charleston, MD;  Location: [facility];  Service: Gynecology;  Laterality: N/A;  . LAPAROSCOPIC UNILATERAL SALPINGO OOPHERECTOMY Right 12/25/2015   Procedure: LAPAROSCOPIC UNILATERAL SALPINGO OOPHORECTOMY;  Surgeon: Bobbye Charleston, MD;  Location: [facility];  Service: Gynecology;  Laterality: Right;  . LAPAROSCOPY N/A 12/25/2015   Procedure: LAPAROSCOPY OPERATIVE;  Surgeon: Bobbye Charleston, MD;  Location: [facility];  Service: Gynecology;  Laterality: N/A;  . LYMPH NODE BIOPSY N/A 04/15/2017   Procedure: SENTINEL LYMPH NODE BIOPSY, PERIAORTIC LYMPH NODES, AND VAGINAL BIOPSY;  Surgeon: Everitt Amber, MD;  Location: [facility];  Service: Gynecology;  Laterality: N/A;  . ROBOTIC ASSISTED TOTAL HYSTERECTOMY WITH BILATERAL SALPINGO OOPHERECTOMY Bilateral 04/15/2017   Procedure: XI ROBOTIC ASSISTED TOTAL LAPROSCOPIC  HYSTERECTOMY WITH LEFT SALPINGO OOPHORECTOMY;  Surgeon: Everitt Amber, MD;  Location: [facility];  Service: Gynecology;  Laterality: Bilateral;  . TONSILLECTOMY  age 89  . TRANSTHORACIC ECHOCARDIOGRAM  07/26/2015   grade 1 diastolic dysfunction ,  ef 60-65%/  trivial TR    Allergies: Armodafinil; Invokana [canagliflozin]; Lasix [furosemide]; and Adhesive [tape]  Medications: Prior to Admission medications   Medication Sig Start Date End Date Taking? Authorizing Provider  Acetaminophen (TYLENOL PO) Take 1 tablet by mouth every 6 (six) hours  as needed (pain/headache).    [provider]  ALPRAZolam (XANAX) 1 MG tablet TAKE 1/2 TO 1 TABLET BY MOUTH AT BEDTIME AS NEEDED FOR ANXIETY Patient taking differently: TAKE 1/2 TO 1 TABLET (0.5 - 1 MG)  BY MOUTH AT BEDTIME AS NEEDED FOR ANXIETY 05/05/16   Laurey Morale, MD  DULoxetine (CYMBALTA) 30 MG capsule Take 90 mg by mouth at bedtime.  04/17/15   [provider]  ezetimibe (ZETIA) 10 MG tablet Take 10 mg by mouth at bedtime.  09/16/16   [provider]  gabapentin (NEURONTIN) 100 MG capsule Take 200 mg two times a day Patient taking differently: 300 mg 2 (two) times daily. Take 200 mg two times a day 05/18/17   [name] D, NP  gabapentin (NEURONTIN) 400 MG capsule Take 1 capsule (400 mg total) by mouth at bedtime. Patient taking differently: Take 600 mg by mouth at bedtime.  05/18/17   Everitt Amber, MD  glucagon (GLUCAGON EMERGENCY) 1 MG injection Inject 1 mg into the muscle once as needed (low blood sugar).    [provider]  hydrocortisone cream 1 % Apply topically 2 (two) times daily. Patient taking differently: Apply 1 application topically 2 (two) times daily as needed for itching.  05/02/17   Everitt Amber, MD  Insulin Human (INSULIN PUMP) SOLN Inject into the skin continuous. Use with Novolog    [provider]  losartan-hydrochlorothiazide (HYZAAR) 50-12.5 MG tablet TAKE 1 TABLET BY MOUTH EVERY DAY 04/12/17   Laurey Morale, MD  morphine (MS CONTIN) 30 MG 12 hr tablet Take 1 tablet (30 mg total) by mouth every 12 (twelve) hours. 05/26/17 06/25/17  Heath Lark, MD  morphine (MSIR) 30 MG tablet Take 1 tablet (30 mg total) by mouth every 4 (four) hours as needed for severe pain. 05/26/17 08/24/17  Heath Lark, MD  senna (SENOKOT) 8.6 MG TABS tablet Take 1 tablet (8.6 mg total) by mouth at bedtime. 05/02/17   Everitt Amber, MD  SUMAtriptan (IMITREX) 100 MG tablet TAKE 1 TABLET BY MOUTH AS NEEDED FOR MIGRAINES. MAY REPEAT IN 2 HOURS IF NEEDED Patient taking  differently: Take 100 mg by mouth every 2 (two) hours as needed for migraine.  02/05/17   Laurey Morale, MD  traMADol (ULTRAM) 50 MG tablet TAKE 2 TABLETS BY MOUTH EVERY 4 HOURS AS NEEDED Patient taking differently: TAKE 1 TABLET (50 MG) BY MOUTH EVERY 4 HOURS AS NEEDED FOR PAIN 05/06/17   Laurey Morale, MD  traZODone (DESYREL) 100 MG tablet Take 100 mg by mouth at bedtime.  04/15/15   [provider]  Vitamin D, Ergocalciferol, (DRISDOL) 50000 units CAPS capsule Take 50,000 Units by mouth every Sunday.    [provider]     Family History  Problem Relation Age of Onset  . Alcohol abuse Father        Died age 58  . Heart disease Father   . Aneurysm Mother        Died age 2  . Leukemia Brother        Died age 68  . COPD Sister   . Rheum arthritis Sister   . Heart failure Sister   . Dementia  Maternal Uncle   . Colon cancer Neg Hx     Social History   Socioeconomic History  . Marital status: Divorced    Spouse name: Not on file  . Number of children: 1  . Years of education: Not on file  . Highest education level: Not on file  Occupational History  . 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 Financial resource strain: Not on file  . Food insecurity:    Worry: Not on file    Inability: Not on file  . Transportation needs:    Medical: Not on file    Non-medical: Not on file  Tobacco Use  . Smoking status: Never Smoker  . Smokeless tobacco: Never Used  Substance and Sexual Activity  . Alcohol use: No    Alcohol/week: 0.0 oz  . Drug use: No  . Sexual activity: Not Currently    Birth control/protection: Post-menopausal  Lifestyle  . Physical activity:    Days per week: Not on file    Minutes per session: Not on file  . Stress: Not on file  Relationships  . Social connections:    Talks on phone: Not on file    Gets together: Not on file    Attends religious service: Not on file    Active member of club or organization: Not on file     Attends meetings of clubs or organizations: Not on file    Relationship status: Not on file  Other Topics Concern  . Not on file  Social History Narrative   Divorced.  Son lives with her.  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     Vital Signs: BP 138/82   Pulse 70   Temp 98.2 F (36.8 C) (Oral)   Resp 15   SpO2 96%   Physical Exam  Constitutional: She appears well-developed.  Nursing note and vitals reviewed. Abdomen:  Right transgluteal drain intact.  Insertion site c/d.  Area of irritation from skin stitch noted. No output in bulb. Left superior trangluteal drain intact.  Insertion site c/d/i.  Drain open, not connected or capped. Left inferior drain intact, c/d/i.  Small amount of serous output in bulb.   Imaging: Ct Abdomen Pelvis W Contrast  Result Date: 06/03/2017 CLINICAL DATA:  Multiple drains after hysterectomy. EXAM: CT ABDOMEN AND PELVIS WITH CONTRAST TECHNIQUE: Multidetector CT imaging of the abdomen and pelvis was performed using the standard protocol following bolus administration of intravenous contrast. CONTRAST:  132mL ISOVUE-300 IOPAMIDOL (ISOVUE-300) INJECTION 61% COMPARISON:  05/18/2017 FINDINGS: Lower chest: No acute abnormality. Hepatobiliary: Mild prominence of the intrahepatic biliary tree is of unknown significance. Gallbladder is unremarkable. No liver mass. Pancreas: Unremarkable Spleen: Unremarkable Adrenals/Urinary Tract: Kidneys and adrenal glands are within normal limits. Hydronephrosis seen previously has improved. Bladder is decompressed. Stomach/Bowel: No obvious mass in the colon. No evidence of small-bowel obstruction. Nonspecific wall thickening of the distal esophagus. Stomach and duodenum are within normal limits. Vascular/Lymphatic: No abnormal retroperitoneal adenopathy. There is soft tissue stranding surrounding the infrarenal aorta and proximal common iliac arteries related to the retroperitoneal fluid collections.  Reproductive: Uterus is absent.  Adnexa are within normal limits. Other: There are 3 drains in place. An additional left retroperitoneal drain has been placed into the previously described left psoas fluid collection. Previously, the fluid collection measured 4.4 x 4.5 cm. Today, it measures 3.2 x 3.8 cm. The more superior left retroperitoneal drain is stable in position without associated abscess. The right-sided  retroperitoneal drain remains coiled. The right-sided fluid collection has resolved. No new fluid collection. Musculoskeletal: No vertebral compression deformity. IMPRESSION: New left retroperitoneal drain has been placed into a fluid collection. This fluid collection has improved from 4.4 x 4.5 cm to 3.2 x 3.8 cm. The other left-sided drain remains in place without associated abscess. The right-sided fluid collection containing a drain has resolved. No new fluid collection. Other incidental findings are noted. Electronically Signed   By: Marybelle Killings M.D.   On: 06/03/2017 14:08    Labs:  CBC: Recent Labs    05/01/17 1040 05/02/17 0509 05/07/17 1257 05/21/17 0804  WBC 5.8 5.6 10.6* 10.3  HGB 12.0 12.8 13.6 12.8  HCT 37.4 39.7 41.2 38.8  PLT 187 188 203 368    COAGS: Recent Labs    04/29/17 1654 05/07/17 1257 05/21/17 0804  INR 0.94 0.95 1.01    BMP: Recent Labs    04/29/17 1654 05/01/17 1040 05/02/17 0509 05/21/17 0804  NA 137 142 141 135  K 4.1 4.0 3.9 4.1  CL 100* 103 103 96*  CO2 28 32 31 25  GLUCOSE 351* 50* 83 224*  BUN 15 16 17 10   CALCIUM 9.2 8.7* 8.7* 8.9  CREATININE 0.86 0.89 0.77 0.94  GFRNONAA >60 >60 >60 >60  GFRAA >60 >60 >60 >60    LIVER FUNCTION TESTS: Recent Labs    10/29/16 1105 04/08/17 1225 04/29/17 1654  BILITOT 0.4 0.2* 0.9  AST 20 39 65*  ALT 19 45 89*  ALKPHOS 88 90 95  PROT 6.2 6.4* 6.8  ALBUMIN 3.6 3.1* 3.3*    Assessment: Intra-abdominal fluid collections  Patient presents to clinic today for follow-up of her  transgluteal drains placed for multiple fluid collections.  CT Abdomen Pelvis obtained today and reviewed by Dr. Barbie Banner. Her right and left superior collections are resolved and no output noted from either drain.   These drains are removed in clinic today with assistance from Dr. Barbie Banner.  Right drain removed without difficulty.  Left superior drain removed with likely a small length of skin stitch remaining within the subcutaneous tissue.  The left inferior fluid collection remains unchanged and the drain itself is difficult to flush.  Upon assessment of the drain, the rotation of the stitch fastener is not in the correct position.  The fastener is rotated and the drain is able to be flushed with return of purulent-appearing material.  This drain is left in place today.  Connected to a gravity bag.  Patient instructed to continue flushes and return to clinic for re-CT only in 1 week.   Signed: Docia Barrier, PA 06/03/2017, 2:58 PM   Please refer to Dr. Barbie Banner attestation of this note for management and plan.

## 2017-06-04 ENCOUNTER — Telehealth: Payer: Self-pay | Admitting: Hematology and Oncology

## 2017-06-04 ENCOUNTER — Inpatient Hospital Stay (HOSPITAL_BASED_OUTPATIENT_CLINIC_OR_DEPARTMENT_OTHER): Payer: Medicare Other | Admitting: Hematology and Oncology

## 2017-06-04 ENCOUNTER — Encounter: Payer: Self-pay | Admitting: Hematology and Oncology

## 2017-06-04 ENCOUNTER — Inpatient Hospital Stay: Payer: Medicare Other

## 2017-06-04 ENCOUNTER — Ambulatory Visit: Payer: Medicare Other

## 2017-06-04 DIAGNOSIS — R188 Other ascites: Secondary | ICD-10-CM

## 2017-06-04 DIAGNOSIS — C541 Malignant neoplasm of endometrium: Secondary | ICD-10-CM

## 2017-06-04 DIAGNOSIS — G8918 Other acute postprocedural pain: Secondary | ICD-10-CM

## 2017-06-04 NOTE — Assessment & Plan Note (Signed)
CT scan is reviewed Postoperative seroma is improving She is scheduled for interventional radiologist for review next week and hopefully her last drain can be removed

## 2017-06-04 NOTE — Progress Notes (Signed)
[place] OFFICE PROGRESS NOTE  Patient Care Team: Laurey Morale, MD as PCP - General  ASSESSMENT & PLAN:  Endometrial cancer [facility]) She has not completely healed from recent surgery 2 of the drains were removed yesterday but one drain is remaining She had repeat CT again next week for assessment I plan to reschedule her appointment to see her back in 10 days for chemotherapy class and chemo consent I will hold off port placement until I am certain that she has no increased risk of infection  Acute postoperative pain She has less pain since the last time I saw her I recommend discontinuation of MS Contin I recommend slow, gentle taper of IR morphine over the next 10 days  Pelvic fluid collection CT scan is reviewed Postoperative seroma is improving She is scheduled for interventional radiologist for review next week and hopefully her last drain can be removed   No orders of the defined types were placed in this encounter.   INTERVAL HISTORY: Please see below for problem oriented charting. She returns for further follow-up She is very anxious today 2 of the drains were removed yesterday She continues to have abdominal pain but less She complained of mild sedation/imbalance in gait No recent nausea or vomiting  SUMMARY OF ONCOLOGIC HISTORY: Oncology History   High grade carcinosarcoma     Endometrial cancer ([place])   12/25/2015 Pathology Results    1. Ovary and fallopian tube, right - BENIGN OVARIAN FIBROTHECOMA. - NO ENDOMETRIOSIS OR MALIGNANCY. - BENIGN FALLOPIAN TUBE WITH BENIGN PARATUBAL CYST. 2. Endometrium, curettage - INACTIVE ENDOMETRIUM, BENIGN ENDOMETRIAL POLYP AND BENIGN SQUAMOUS FRAGMENTS. - NO HYPERPLASIA OR MALIGNANCY      12/25/2015 Surgery    PRE-OPERATIVE DIAGNOSIS:  RLQ PAIN, thickened endometrium  POST-OPERATIVE DIAGNOSIS:  R O lesion, adhesions, polyp in uterus  PROCEDURE:  Procedure(s): DILATATION AND CURETTAGE  /HYSTEROSCOPY (N/A) LAPAROSCOPY OPERATIVE (N/A) LAPAROSCOPIC LYSIS OF ADHESIONS (N/A) LAPAROSCOPIC UNILATERAL SALPINGO OOPHORECTOMY (Right        01/25/2017 Initial Diagnosis    The patient reports postmenopausal bleeding since December, 2018. Pap smear showed AGUS.         03/17/2017 Pathology Results    Endometrial biopsy on 03/17/17 showed grade 3 endometrial adenocarcinoma.      03/26/2017 Imaging    Ct abdomen Diffuse endometrial thickening consistent with endometrial carcinoma, with probable focal area of deep myometrial invasion in the anterior fundus.  No evidence of extra uterine tumor extension, lymphadenopathy, or distant metastatic disease.  Two tiny bilateral lower lobe pulmonary nodules, largest measuring 5 mm. These are indeterminate, but likely benign. Recommend follow-up with chest CT without contrast in 3-6 months.      04/15/2017 Surgery    Operation: Robotic-assisted laparoscopic total hysterectomy with left salpingoophorectomy, SLN biopsy, para-aortic lymphadenectomy (bilateral), vaginal biopsy.  Surgeon: Donaciano Eva  Operative Findings:  : 68mm nodule on left proximal (upper) vaginal side wall (benign on frozen). 8cm normal appearing uterus, surgically absent right ovary and tube, normal left ovary somewhat adherent to round ligament. Indurated left external iliac SLN. No other suspicious nodes or apparent extrauterine disease        04/15/2017 Pathology Results    1. Vagina, biopsy - ATYPICAL CELLS, SUSPICIOUS BUT NOT DIAGNOSTIC OF MALIGNANCY. SEE NOTE 2. Lymph node, sentinel, biopsy, right presacral #1 - LYMPH NODE, NEGATIVE FOR MALIGNANCY (0/1) - IMMUNOSTAIN FOR PANKERATIN SHOWS FOCAL NONSPECIFIC LABELING 3. Lymph node, sentinel, biopsy, right presacral #2 - LYMPH NODE, NEGATIVE FOR MALIGNANCY (0/1) -  IMMUNOSTAIN FOR PANKERATIN IS NEGATIVE 4. Lymph node, sentinel, biopsy, left external iliac - LYMPH NODE, NEGATIVE FOR MALIGNANCY (0/1) -  IMMUNOSTAIN FOR PANKERATIN IS NEGATIVE 5. Lymph node, biopsy, right periaortic - TWO LYMPH NODES, NEGATIVE FOR MALIGNANCY (0/2) 6. Lymph node, biopsy, left periaortic - SEVEN LYMPH NODES, NEGATIVE FOR MALIGNANCY (0/7) 7. 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 SEE NOTE - TUMOR INVADES A LITTLE OVER HALF OF THE MYOMETRIUM (INVASION DEPTH OF 0.7 CM WHERE MYOMETRIUM IS 1.3 CM THICK) 1. Vagina, biopsy - ATYPICAL CELLS, SUSPICIOUS BUT NOT DIAGNOSTIC OF MALIGNANCY. SEE NOTE 2. Lymph node, sentinel, biopsy, right presacral #1 - LYMPH NODE, NEGATIVE FOR MALIGNANCY (0/1) - IMMUNOSTAIN FOR PANKERATIN SHOWS FOCAL NONSPECIFIC LABELING 3. Lymph node, sentinel, biopsy, right presacral #2 - LYMPH NODE, NEGATIVE FOR MALIGNANCY (0/1) - IMMUNOSTAIN FOR PANKERATIN IS NEGATIVE 4. Lymph node, sentinel, biopsy, left external iliac - LYMPH NODE, NEGATIVE FOR MALIGNANCY (0/1) - IMMUNOSTAIN FOR PANKERATIN IS NEGATIVE 5. Lymph node, biopsy, right periaortic - TWO LYMPH NODES, NEGATIVE FOR MALIGNANCY (0/2) 6. Lymph node, biopsy, left periaortic - SEVEN LYMPH NODES, NEGATIVE FOR MALIGNANCY (0/7) 7. 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 SEE NOTE - TUMOR INVADES A LITTLE OVER HALF OF THE MYOMETRIUM (INVASION DEPTH OF 0.7 CM WHERE MYOMETRIUM IS 1.3 CM THICK)  7. ONCOLOGY TABLE-UTERUS, CARCINOMA OR CARCINOSARCOMA 1 of 4 Duplicate copy FINAL for Gagliano, Krystin [id number]) Microscopic Comment(continued) Specimen: Uterus with left fallopian tube and ovary Procedure: Total hysterectomy with left salpingoophorectomy Lymph node sampling performed: Sentinel and non-sentinel Specimen integrity: Intact Maximum tumor size: 4.2  cm Histologic type: Carcinosarcoma (Malignant mullerian mixed tumor) Grade: High-grade Myometrial invasion: 0.7 cm where myometrium is 1.3 cm in thickness Cervical stromal involvement: Not identified Extent of involvement of other organs: Not involved Lymph - vascular invasion: Not identified Peritoneal washings: NA Lymph nodes: Examined: 3 Sentinel 9 Non-sentinel 12 Total Lymph nodes with metastasis: 0 Isolated tumor cells (< 0.2 mm): 0 Micrometastasis: (> 0.2 mm and < 2.0 mm): 0 Macrometastasis: (> 2.0 mm): 0 Extracapsular extension: NA TNM code: pT1b, pN0 FIGO Stage (based on pathologic findings, needs clinical correlation): 1B      04/29/2017 Imaging    CT abdomen Multiloculated bilateral retroperitoneal fluid collections, consistent with postoperative lymphoceles.  No evidence of hydronephrosis or other complication      04/30/8097 - 05/02/2017 Hospital Admission    She was admitted for evaluation of severe pain. CT showed fluid collection and IR was consulted. Drain was placed to drain the fluid collection      04/30/2017 Cancer Staging    Staging form: Corpus Uteri - Carcinoma and Carcinosarcoma, AJCC 8th Edition - Pathologic: Stage IB (pT1b, pN0, cM0) - Signed by Heath Lark, MD on 04/30/2017      04/30/2017 Procedure    Successful abdominal abscess drain x2.      05/06/2017 Imaging    1. Interval retraction of right-sided percutaneous drainage catheter with end now coiled and locked peripheral the grossly unchanged right-sided retroperitoneal fluid collection, presumed postoperative seroma. 2. Unchanged positioning of left-sided percutaneous drainage catheter with end coiled and locked within the peripheral aspect of the grossly unchanged left-sided retroperitoneal fluid collection, presumed postoperative seroma.  PLAN: - The malpositioned right-sided retroperitoneal drainage catheter was removed at the patient's bedside without  incident      05/18/2017 Imaging    Right  retroperitoneal fluid collections are improved. The right drain has been repositioned into the more anterior fluid collection which is smaller than on the prior study.  The lateral left retroperitoneal fluid collection has resolved after drainage. The more medial and anterior fluid collection has increased in size. The drain is not coiled in this more medial fluid collection.      06/03/2017 Imaging    New left retroperitoneal drain has been placed into a fluid collection. This fluid collection has improved from 4.4 x 4.5 cm to 3.2 x 3.8 cm.  The other left-sided drain remains in place without associated abscess.  The right-sided fluid collection containing a drain has resolved.  No new fluid collection.  Other incidental findings are noted.       REVIEW OF SYSTEMS:   Constitutional: Denies fevers, chills or abnormal weight loss Eyes: Denies blurriness of vision Ears, nose, mouth, throat, and face: Denies mucositis or sore throat Respiratory: Denies cough, dyspnea or wheezes Cardiovascular: Denies palpitation, chest discomfort or lower extremity swelling Gastrointestinal:  Denies nausea, heartburn or change in bowel habits Skin: Denies abnormal skin rashes Lymphatics: Denies new lymphadenopathy or easy bruising Neurological:Denies numbness, tingling or new weaknesses Behavioral/Psych: Mood is stable, no new changes  All other systems were reviewed with the patient and are negative.  I have reviewed the past medical history, past surgical history, social history and family history with the patient and they are unchanged from previous note.  ALLERGIES:  is allergic to armodafinil; invokana [canagliflozin]; lasix [furosemide]; and adhesive [tape].  MEDICATIONS:  Current Outpatient Medications  Medication Sig Dispense Refill  . Acetaminophen (TYLENOL PO) Take 1 tablet by mouth every 6 (six) hours as needed (pain/headache).    . ALPRAZolam (XANAX) 1 MG tablet TAKE 1/2 TO 1 TABLET BY  MOUTH AT BEDTIME AS NEEDED FOR ANXIETY (Patient taking differently: TAKE 1/2 TO 1 TABLET (0.5 - 1 MG)  BY MOUTH AT BEDTIME AS NEEDED FOR ANXIETY) 30 tablet 5  . DULoxetine (CYMBALTA) 30 MG capsule Take 90 mg by mouth at bedtime.   2  . ezetimibe (ZETIA) 10 MG tablet Take 10 mg by mouth at bedtime.   12  . gabapentin (NEURONTIN) 100 MG capsule Take 200 mg two times a day (Patient taking differently: 300 mg 2 (two) times daily. Take 200 mg two times a day) 120 capsule 0  . gabapentin (NEURONTIN) 400 MG capsule Take 1 capsule (400 mg total) by mouth at bedtime. (Patient taking differently: Take 600 mg by mouth at bedtime. ) 30 capsule 0  . glucagon (GLUCAGON EMERGENCY) 1 MG injection Inject 1 mg into the muscle once as needed (low blood sugar).    . hydrocortisone cream 1 % Apply topically 2 (two) times daily. (Patient taking differently: Apply 1 application topically 2 (two) times daily as needed for itching. ) 30 g 0  . Insulin Human (INSULIN PUMP) SOLN Inject into the skin continuous. Use with Novolog    . losartan-hydrochlorothiazide (HYZAAR) 50-12.5 MG tablet TAKE 1 TABLET BY MOUTH EVERY DAY 90 tablet 0  . morphine (MS CONTIN) 30 MG 12 hr tablet Take 1 tablet (30 mg total) by mouth every 12 (twelve) hours. 60 tablet 0  . morphine (MSIR) 30 MG tablet Take 1 tablet (30 mg total) by mouth every 4 (four) hours as needed for severe pain. 90 tablet 0  . senna (SENOKOT) 8.6 MG TABS tablet Take 1 tablet (8.6  mg total) by mouth at bedtime. 120 each 0  . SUMAtriptan (IMITREX) 100 MG tablet TAKE 1 TABLET BY MOUTH AS NEEDED FOR MIGRAINES. MAY REPEAT IN 2 HOURS IF NEEDED (Patient taking differently: Take 100 mg by mouth every 2 (two) hours as needed for migraine. ) 30 tablet 5  . traMADol (ULTRAM) 50 MG tablet TAKE 2 TABLETS BY MOUTH EVERY 4 HOURS AS NEEDED (Patient taking differently: TAKE 1 TABLET (50 MG) BY MOUTH EVERY 4 HOURS AS NEEDED FOR PAIN) 360 tablet 1  . traZODone (DESYREL) 100 MG tablet Take 100 mg by  mouth at bedtime.   1  . Vitamin D, Ergocalciferol, (DRISDOL) 50000 units CAPS capsule Take 50,000 Units by mouth every Sunday.     No current facility-administered medications for this visit.     PHYSICAL EXAMINATION: ECOG PERFORMANCE STATUS: 1 - Symptomatic but completely ambulatory  Vitals:   06/04/17 1205  BP: 100/73  Pulse: 79  Resp: 18  Temp: 98 F (36.7 C)  SpO2: 98%   Filed Weights   06/04/17 1205  Weight: 213 lb 8 oz (96.8 kg)    GENERAL:alert, no distress and comfortable NEURO: alert & oriented x 3 with fluent speech, no focal motor/sensory deficits  LABORATORY DATA:  I have reviewed the data as listed    Component Value Date/Time   NA 135 05/21/2017 0804   K 4.1 05/21/2017 0804   CL 96 (L) 05/21/2017 0804   CO2 25 05/21/2017 0804   GLUCOSE 224 (H) 05/21/2017 0804   BUN 10 05/21/2017 0804   CREATININE 0.94 05/21/2017 0804   CALCIUM 8.9 05/21/2017 0804   PROT 6.8 04/29/2017 1654   ALBUMIN 3.3 (L) 04/29/2017 1654   AST 65 (H) 04/29/2017 1654   ALT 89 (H) 04/29/2017 1654   ALKPHOS 95 04/29/2017 1654   BILITOT 0.9 04/29/2017 1654   GFRNONAA >60 05/21/2017 0804   GFRAA >60 05/21/2017 0804    No results found for: SPEP, UPEP  Lab Results  Component Value Date   WBC 10.3 05/21/2017   NEUTROABS 4.1 10/29/2016   HGB 12.8 05/21/2017   HCT 38.8 05/21/2017   MCV 87.2 05/21/2017   PLT 368 05/21/2017      Chemistry      Component Value Date/Time   NA 135 05/21/2017 0804   K 4.1 05/21/2017 0804   CL 96 (L) 05/21/2017 0804   CO2 25 05/21/2017 0804   BUN 10 05/21/2017 0804   CREATININE 0.94 05/21/2017 0804      Component Value Date/Time   CALCIUM 8.9 05/21/2017 0804   ALKPHOS 95 04/29/2017 1654   AST 65 (H) 04/29/2017 1654   ALT 89 (H) 04/29/2017 1654   BILITOT 0.9 04/29/2017 1654       RADIOGRAPHIC STUDIES: I have personally reviewed the radiological images as listed and agreed with the findings in the report. Ct Abdomen Pelvis W  Contrast  Result Date: 06/03/2017 CLINICAL DATA:  Multiple drains after hysterectomy. EXAM: CT ABDOMEN AND PELVIS WITH CONTRAST TECHNIQUE: Multidetector CT imaging of the abdomen and pelvis was performed using the standard protocol following bolus administration of intravenous contrast. CONTRAST:  130mL ISOVUE-300 IOPAMIDOL (ISOVUE-300) INJECTION 61% COMPARISON:  05/18/2017 FINDINGS: Lower chest: No acute abnormality. Hepatobiliary: Mild prominence of the intrahepatic biliary tree is of unknown significance. Gallbladder is unremarkable. No liver mass. Pancreas: Unremarkable Spleen: Unremarkable Adrenals/Urinary Tract: Kidneys and adrenal glands are within normal limits. Hydronephrosis seen previously has improved. Bladder is decompressed. Stomach/Bowel: No obvious mass in the colon.  No evidence of small-bowel obstruction. Nonspecific wall thickening of the distal esophagus. Stomach and duodenum are within normal limits. Vascular/Lymphatic: No abnormal retroperitoneal adenopathy. There is soft tissue stranding surrounding the infrarenal aorta and proximal common iliac arteries related to the retroperitoneal fluid collections. Reproductive: Uterus is absent.  Adnexa are within normal limits. Other: There are 3 drains in place. An additional left retroperitoneal drain has been placed into the previously described left psoas fluid collection. Previously, the fluid collection measured 4.4 x 4.5 cm. Today, it measures 3.2 x 3.8 cm. The more superior left retroperitoneal drain is stable in position without associated abscess. The right-sided retroperitoneal drain remains coiled. The right-sided fluid collection has resolved. No new fluid collection. Musculoskeletal: No vertebral compression deformity. IMPRESSION: New left retroperitoneal drain has been placed into a fluid collection. This fluid collection has improved from 4.4 x 4.5 cm to 3.2 x 3.8 cm. The other left-sided drain remains in place without associated  abscess. The right-sided fluid collection containing a drain has resolved. No new fluid collection. Other incidental findings are noted. Electronically Signed   By: Marybelle Killings M.D.   On: 06/03/2017 14:08   Ct Abdomen Pelvis W Contrast  Result Date: 05/18/2017 CLINICAL DATA:  Follow-up retroperitoneal drainage EXAM: CT ABDOMEN AND PELVIS WITH CONTRAST TECHNIQUE: Multidetector CT imaging of the abdomen and pelvis was performed using the standard protocol following bolus administration of intravenous contrast. CONTRAST:  131mL ISOVUE-300 IOPAMIDOL (ISOVUE-300) INJECTION 61% COMPARISON:  05/06/2017 FINDINGS: Lower chest: Dependent atelectasis Hepatobiliary: Nonspecific mild gallbladder wall thickening. No liver lesion. Pancreas: Unremarkable Spleen: Unremarkable Adrenals/Urinary Tract: Stable mild hydronephrosis. Adrenal glands are unremarkable. Bladder decompressed. Stomach/Bowel: Moderate stool burden throughout the colon. No evidence of small-bowel obstruction. Normal appendix. Stomach is distended with enteric contents. Vascular/Lymphatic: No evidence of aortic aneurysm. Reproductive: Post hysterectomy Other: There has been further improvement regarding the retroperitoneal fluid collections. The right drain has been repositioned into the more central retroperitoneal fluid collection. The perinephric fluid collection on the right has resolved. Today, the fluid collection layering over the right psoas muscle measures 2.3 x 3.4 cm which is improved. The drain is coiled within this fluid collection. On the left, the more lateral fluid collection has been drained and is now resolved. There is a separate more anterior fluid collection which is larger and today measures 4.4 x 4.5 cm. The drain is adjacent to this fluid collection. There is stranding surrounding the lower abdominal aorta and within the retroperitoneum likely due to inflammatory change. This has increased since the prior study. Musculoskeletal: No  vertebral compression deformity. Degenerative changes. IMPRESSION: Right retroperitoneal fluid collections are improved. The right drain has been repositioned into the more anterior fluid collection which is smaller than on the prior study. The lateral left retroperitoneal fluid collection has resolved after drainage. The more medial and anterior fluid collection has increased in size. The drain is not coiled in this more medial fluid collection. Electronically Signed   By: Marybelle Killings M.D.   On: 05/18/2017 13:12   Ct Abdomen Pelvis W Contrast  Result Date: 05/06/2017 CLINICAL DATA:  History of hysterectomy and BSO and lymph node dissection for endometrial carcinoma who presented to the emergency department on 04/29/2017 with abdominal pain and subsequent CT scan demonstrating development of bilateral retroperitoneal fluid collections concerning for postoperative seromas and/or abscesses. As such, patient underwent bilateral CT-guided percutaneous drainage catheter placement on 04/30/2017 by Dr. Barbie Banner. Patient presents today to the interventional radiology drain Clinic for drainage catheter evaluation and management.  EXAM: CT ABDOMEN AND PELVIS WITH CONTRAST TECHNIQUE: Multidetector CT imaging of the abdomen and pelvis was performed using the standard protocol following bolus administration of intravenous contrast. CONTRAST:  142mL ISOVUE-300 IOPAMIDOL (ISOVUE-300) INJECTION 61% COMPARISON:  CT abdomen pelvis-04/29/2017; 03/26/2017; CT-guided bilateral retroperitoneal drainage catheter placement-04/30/2017 FINDINGS: Lower chest: Limited visualization of the lower thorax demonstrates unchanged punctate (approximately 0.4 cm) nodules within the right lower (image 4, series 4 and left lower (image 11, series 4) lobes. Minimal subsegmental atelectasis within the medial basilar aspect of the left lower lobe. No focal airspace opacities. No pleural effusion. Normal heart size.  No pericardial effusion. Hepatobiliary:  Normal hepatic contour. There is a minimal amount of focal fatty infiltration adjacent to the fissure for ligamentum teres. No discrete hepatic lesions. Normal appearance of the gallbladder given degree distention. No radiopaque gallstones. Grossly unchanged very mild centralized intrahepatic biliary duct dilatation. No extrahepatic bili duct dilatation. No ascites. Pancreas: The pancreas remains slightly atrophic. Spleen: Normal appearance of the spleen. Adrenals/Urinary Tract: There is symmetric enhancement of the bilateral kidneys. No renal stones in this postcontrast examination. No discrete renal lesions. No urinary obstruction or perinephric stranding. Normal appearance the bilateral adrenal glands. Normal appearance of the urinary bladder given underdistention. Stomach/Bowel: Moderate colonic stool burden without evidence of enteric obstruction. The cecum is noted to be located within the right mid abdomen. Normal appearance of the terminal ileum. The appendix is not visualized, however there is no pericecal inflammatory change. No pneumoperitoneum, pneumatosis or portal venous gas. Vascular/Lymphatic: Normal caliber the abdominal aorta. The major branch vessels of the abdominal aorta appear patent on this non CTA examination. No bulky retroperitoneal, mesenteric, pelvic or inguinal lymphadenopathy. Reproductive: Post hysterectomy and bilateral oophorectomy. No free fluid the pelvic cul-de-sac. Other: Right-sided retroperitoneal percutaneous drainage catheter has been retracted is currently located outside the residual right retroperitoneal fluid collection the right-sided retroperitoneal fluid collection is grossly unchanged in size measuring approximately 10.5 x 4.8 x 9.7 cm (axial image 43, series 2, coronal image 77, series 6, previously, 9.3 x 3.5 x 9.6 cm. The right-sided retroperitoneal drainage catheter is located within the peripheral aspect of the left retroperitoneal fluid collection and the  left-sided fluid collection is also grossly unchanged in size measuring approximately 9.6 x 5.4 x 13.1 cm (axial image 38, series 2, coronal image 81, series 6 previously, 9.5 x 4.8 x 14.1 cm. Musculoskeletal: No acute or aggressive osseous abnormalities. Stigmata of DISH within thoracic spine. Grade 1 anterolisthesis of L4 upon L5 measuring approximately 5 mm without associated pars defects, unchanged. Bilateral degenerative change of the lower lumbar spine. IMPRESSION: 1. Interval retraction of right-sided percutaneous drainage catheter with end now coiled and locked peripheral the grossly unchanged right-sided retroperitoneal fluid collection, presumed postoperative seroma. 2. Unchanged positioning of left-sided percutaneous drainage catheter with end coiled and locked within the peripheral aspect of the grossly unchanged left-sided retroperitoneal fluid collection, presumed postoperative seroma. PLAN: - The malpositioned right-sided retroperitoneal drainage catheter was removed at the patient's bedside without incident. - Above discussed with both Dr. Alvy Bimler and [name] and the decision was made to proceed with CT-guided replacement of the right-sided retroperitoneal percutaneous drainage catheter and exchange/up sizing of the left-sided retroperitoneal drainage catheter. This will be performed at an outpatient basis tomorrow [place] Dr. Earleen Newport. Electronically Signed   By: Sandi Mariscal M.D.   On: 05/06/2017 15:25   Ct Image Guided Drainage By Percutaneous Catheter  Result Date: 05/21/2017 CLINICAL DATA:  Endometrial adenocarcinoma status post hysterectomy  and lymph node biopsy. Postop retroperitoneal fluid collections. Bilateral drain catheters have been placed. There is enlarging left retroperitoneal collection at the level of the aortic bifurcation and additional drainage requested. EXAM: CT GUIDED DRAINAGE OF LEFT RETROPERITONEAL ABSCESS ANESTHESIA/SEDATION: Intravenous Fentanyl and  Versed were administered as conscious sedation during continuous monitoring of the patient's level of consciousness and physiological / cardiorespiratory status by the radiology RN, with a total moderate sedation time of 17 minutes. PROCEDURE: The procedure, risks, benefits, and alternatives were explained to the patient. Questions regarding the procedure were encouraged and answered. The patient understands and consents to the procedure. Patient placed prone. Select axial scans through the lower abdomen obtained an the left enlarging retroperitoneal collection was localized. An appropriate skin entry site was determined and marked. The operative field was prepped with chlorhexidinein a sterile fashion, and a sterile drape was applied covering the operative field. A sterile gown and sterile gloves were used for the procedure. Local anesthesia was provided with 1% Lidocaine. Under CT fluoroscopic guidance, a 18 gauge trocar needle was was advanced into the collection. Thin pale yellow fluid could be aspirated. Amplatz guidewire advanced easily within the collection, its position confirmed on CT. Tract dilated to facilitate placement of a 12 French pigtail drain catheter, formed centrally within the collection. CT confirms appropriate catheter position. A total of 50 mL thin pale yellow fluid were aspirated, sample sent for creatinine. The catheter was then secured externally with 0 Prolene suture and placed a bulb suction. The patient tolerated the procedure well. COMPLICATIONS: None immediate FINDINGS: The left retroperitoneal enlarging fluid collection was again localized. 12 French drain catheter placed as above. 50 mL pale thin yellow fluid were aspirated during the procedure, a sample sent for creatinine. IMPRESSION: 1. Technically successful left retroperitoneal drain catheter placement. PLAN: Continue daily flushing and bulb suction of all  3 drains. Follow-up in drain clinic in 2 weeks for CT and possible  removal of 1 or more drains. Electronically Signed   By: Lucrezia Europe M.D.   On: 05/21/2017 12:11   Ct Image Guided Drainage By Percutaneous Catheter  Result Date: 05/07/2017 INDICATION: 67 year old female with a history of persisting seroma of the retroperitoneum. Right-sided tube was removed yesterday. Left-sided tube is dysfunctional. EXAM: CT-GUIDED PLACEMENT OF RIGHT RETROPERITONEAL FLUID COLLECTION DRAIN CT GUIDED EXCHANGE OF LEFT RETROPERITONEAL FLUID DRAIN MEDICATIONS: The patient is currently admitted to the hospital and receiving intravenous antibiotics. The antibiotics were administered within an appropriate time frame prior to the initiation of the procedure. ANESTHESIA/SEDATION: 2.0 mg IV Versed 100 mcg IV Fentanyl Moderate Sedation Time:  20 minutes The patient was continuously monitored during the procedure by the interventional radiology nurse under my direct supervision. COMPLICATIONS: None immediate. TECHNIQUE: Informed written consent was obtained from the patient after a thorough discussion of the procedural risks, benefits and alternatives. All questions were addressed. Maximal Sterile Barrier Technique was utilized including caps, mask, sterile gowns, sterile gloves, sterile drape, hand hygiene and skin antiseptic. A timeout was performed prior to the initiation of the procedure. PROCEDURE: The bilateral flank including the indwelling left-sided drain was prepped with chlorhexidine in a sterile fashion, and a patient is position prone and CT imaging of the abdomen performed. Sterile drape was applied covering the operative field. A sterile gown and sterile gloves were used for the procedure. Local anesthesia was provided with 1% Lidocaine. Using modified Seldinger technique, a 10 [place] biliary drain was placed into the right-sided fluid collection. Modified Seldinger technique was then used  to exchange the left-sided indwelling drain for a new 10 [place] biliary drain. Aspiration was  performed of both fluid collection. Both drains were sutured in position and attached to bulb suction. Final image was stored. Patient tolerated the procedure well and remained hemodynamically stable throughout. No complications were encountered and no significant blood loss. Sample sent the lab from each side, for culture, triglycerides, creatinine. FINDINGS: Initial CT demonstrates residual fluid collection adjacent to the indwelling left drain. Fluid in the right-sided retroperitoneal region. Final image demonstrates decompression of bilateral retroperitoneal fluid collection with the biliary drains in position. IMPRESSION: Status post CT guided exchange of left-sided retroperitoneal drain for a new 10 [place] biliary drain, as well as CT-guided placement of a biliary drain into a right-sided persisting retroperitoneal fluid collection. Signed, Dulcy Fanny. Earleen Newport, DO [facility] Radiology Electronically Signed   By: Corrie Mckusick D.O.   On: 05/07/2017 17:17   Ct Image Guided Drainage By Percutaneous Catheter  Result Date: 05/07/2017 INDICATION: 67 year old female with a history of persisting seroma of the retroperitoneum. Right-sided tube was removed yesterday. Left-sided tube is dysfunctional. EXAM: CT-GUIDED PLACEMENT OF RIGHT RETROPERITONEAL FLUID COLLECTION DRAIN CT GUIDED EXCHANGE OF LEFT RETROPERITONEAL FLUID DRAIN MEDICATIONS: The patient is currently admitted to the hospital and receiving intravenous antibiotics. The antibiotics were administered within an appropriate time frame prior to the initiation of the procedure. ANESTHESIA/SEDATION: 2.0 mg IV Versed 100 mcg IV Fentanyl Moderate Sedation Time:  20 minutes The patient was continuously monitored during the procedure by the interventional radiology nurse under my direct supervision. COMPLICATIONS: None immediate. TECHNIQUE: Informed written consent was obtained from the patient after a thorough  discussion of the procedural risks, benefits and alternatives. All questions were addressed. Maximal Sterile Barrier Technique was utilized including caps, mask, sterile gowns, sterile gloves, sterile drape, hand hygiene and skin antiseptic. A timeout was performed prior to the initiation of the procedure. PROCEDURE: The bilateral flank including the indwelling left-sided drain was prepped with chlorhexidine in a sterile fashion, and a patient is position prone and CT imaging of the abdomen performed. Sterile drape was applied covering the operative field. A sterile gown and sterile gloves were used for the procedure. Local anesthesia was provided with 1% Lidocaine. Using modified Seldinger technique, a 10 [place] biliary drain was placed into the right-sided fluid collection. Modified Seldinger technique was then used to exchange the left-sided indwelling drain for a new 10 [place] biliary drain. Aspiration was performed of both fluid collection. Both drains were sutured in position and attached to bulb suction. Final image was stored. Patient tolerated the procedure well and remained hemodynamically stable throughout. No complications were encountered and no significant blood loss. Sample sent the lab from each side, for culture, triglycerides, creatinine. FINDINGS: Initial CT demonstrates residual fluid collection adjacent to the indwelling left drain. Fluid in the right-sided retroperitoneal region. Final image demonstrates decompression of bilateral retroperitoneal fluid collection with the biliary drains in position. IMPRESSION: Status post CT guided exchange of left-sided retroperitoneal drain for a new 10 [place] biliary drain, as well as CT-guided placement of a biliary drain into a right-sided persisting retroperitoneal fluid collection. Signed, Dulcy Fanny. Earleen Newport, DO [facility] Radiology Electronically Signed   By: Corrie Mckusick D.O.   On: 05/07/2017 17:17    Ir Radiologist Eval & Mgmt  Result Date: 06/03/2017 Please refer to notes tab for details about interventional procedure. (Op Note)  Ir Radiologist Eval & Mgmt  Result Date: 05/20/2017  Please refer to notes tab for details about interventional procedure. (Op Note)  Ir Radiologist Eval & Mgmt  Result Date: 05/18/2017 Please refer to notes tab for details about interventional procedure. (Op Note)  Ir Radiologist Eval & Mgmt  Result Date: 05/06/2017 Please refer to notes tab for details about interventional procedure. (Op Note)   All questions were answered. The patient knows to call the clinic with any problems, questions or concerns. No barriers to learning was detected.  I spent 15 minutes counseling the patient face to face. The total time spent in the appointment was 20 minutes and more than 50% was on counseling and review of test results  Heath Lark, MD 06/04/2017 12:44 PM

## 2017-06-04 NOTE — Telephone Encounter (Signed)
Gave patient AVs and calendar of upcoming may appointments. D/T ok per NG.

## 2017-06-04 NOTE — Assessment & Plan Note (Signed)
She has less pain since the last time I saw her I recommend discontinuation of MS Contin I recommend slow, gentle taper of IR morphine over the next 10 days

## 2017-06-04 NOTE — Assessment & Plan Note (Signed)
She has not completely healed from recent surgery 2 of the drains were removed yesterday but one drain is remaining She had repeat CT again next week for assessment I plan to reschedule her appointment to see her back in 10 days for chemotherapy class and chemo consent I will hold off port placement until I am certain that she has no increased risk of infection

## 2017-06-09 ENCOUNTER — Ambulatory Visit
Admission: RE | Admit: 2017-06-09 | Discharge: 2017-06-09 | Disposition: A | Discharge status: Home / Self Care | Payer: Medicare Other | Source: Ambulatory Visit | Attending: Hematology and Oncology | Admitting: Hematology and Oncology

## 2017-06-09 ENCOUNTER — Encounter: Payer: Self-pay | Admitting: *Deleted

## 2017-06-09 ENCOUNTER — Other Ambulatory Visit: Payer: Self-pay | Admitting: Hematology and Oncology

## 2017-06-09 DIAGNOSIS — N739 Female pelvic inflammatory disease, unspecified: Secondary | ICD-10-CM

## 2017-06-09 HISTORY — PX: IR RADIOLOGIST EVAL & MGMT: IMG5224

## 2017-06-09 MED ORDER — IOPAMIDOL (ISOVUE-300) INJECTION 61%
125.0000 mL | Freq: Once | INTRAVENOUS | Status: AC | PRN
  Administered 2017-06-09: 125 mL via INTRAVENOUS

## 2017-06-09 NOTE — Progress Notes (Signed)
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 She subsequently developed intra-abdominal fluid collections and had a left and right abdominal drain placed 04/30/17.  The left drain required exchange and right drain replaced.  Ultimately she also required a third drain placed on the right 05/21/17.  She has since undergone removal of both right and left superior retroperitoneal drains on 06/03/2017.  She presents again today for follow-up CT and assessment of remaining left retroperitoneal/TG drain.  According to patient she has been doing well.  She denies fever, headache, chest pain, dyspnea, cough, significant abdominal/back pain, nausea, vomiting or bleeding.  Cultures from previous drain fluid were negative.  She reports minimal output from drain.  She is not on antibiotic therapy.  She is flushing her drain twice daily.     Past Medical History:  Diagnosis Date  . Depression   . Fibromyalgia   . Headache(784.0)   . History of colon polyps   . History of diabetes with ketoacidosis   . Hx of colonic polyps   . Hypertension   . Insomnia   . Insulin pump in place   . RBBB (right bundle branch block)   . RLQ abdominal pain   . Thickened endometrium   . Type 1 diabetes mellitus with long-term current use of insulin [facility]) endocrinologist-- dr Chalmers Cater   July 2015--  pt took Invokana and caused complete pancreas shut-down , pt went from pre-diabetic to adult type 1 dm now insulin dependent  . Vertigo     Past Surgical History:  Procedure Laterality Date  . CARDIOVASCULAR STRESS TEST   07/26/2015   normal myocardial perfusion study w/ no ischemia/  normal LV function and wall motion , ef 71%  . COLONOSCOPY WITH PROPOFOL  last one 10/ 2017  . HYSTEROSCOPY W/D&C N/A 12/25/2015   Procedure: DILATATION AND CURETTAGE /HYSTEROSCOPY;  Surgeon: Bobbye Charleston, MD;  [facility];  Service: Gynecology;  Laterality: N/A;  . IR RADIOLOGIST EVAL & MGMT  05/06/2017  . IR RADIOLOGIST EVAL & MGMT  05/18/2017  . IR RADIOLOGIST EVAL & MGMT  05/13/2017  . IR RADIOLOGIST EVAL & MGMT  06/03/2017  . LAPAROSCOPIC LYSIS OF ADHESIONS N/A 12/25/2015   Procedure: LAPAROSCOPIC LYSIS OF ADHESIONS;  Surgeon: Bobbye Charleston, MD;  Location: [place];  Service: Gynecology;  Laterality: N/A;  . LAPAROSCOPIC UNILATERAL SALPINGO OOPHERECTOMY Right 12/25/2015   Procedure: LAPAROSCOPIC UNILATERAL SALPINGO OOPHORECTOMY;  Surgeon: Bobbye Charleston, MD;  Location: [place];  Service: Gynecology;  Laterality: Right;  . LAPAROSCOPY N/A 12/25/2015   Procedure: LAPAROSCOPY OPERATIVE;  Surgeon: Bobbye Charleston, MD;  Location: [facility];  Service: Gynecology;  Laterality: N/A;  . LYMPH NODE BIOPSY N/A 04/15/2017   Procedure: SENTINEL LYMPH NODE BIOPSY, PERIAORTIC LYMPH NODES, AND VAGINAL BIOPSY;  Surgeon: Everitt Amber, MD;  Location: [facility];  Service: Gynecology;  Laterality: N/A;  . ROBOTIC ASSISTED TOTAL HYSTERECTOMY WITH BILATERAL SALPINGO OOPHERECTOMY Bilateral 04/15/2017   Procedure: XI ROBOTIC ASSISTED TOTAL LAPROSCOPIC  HYSTERECTOMY WITH LEFT SALPINGO OOPHORECTOMY;  Surgeon: Everitt Amber, MD;  Location: [facility];  Service: Gynecology;  Laterality: Bilateral;  . TONSILLECTOMY  age 24  .  TRANSTHORACIC ECHOCARDIOGRAM  07/26/2015   grade 1 diastolic dysfunction , ef 60-65%/  trivial TR    Allergies: Armodafinil; Invokana [canagliflozin]; Lasix [furosemide]; and Adhesive [tape]  Medications: Prior to Admission medications   Medication Sig  Start Date End Date Taking? Authorizing Provider  Acetaminophen (TYLENOL PO) Take 1 tablet by mouth every 6 (six) hours as needed (pain/headache).    [provider]  ALPRAZolam (XANAX) 1 MG tablet TAKE 1/2 TO 1 TABLET BY MOUTH AT BEDTIME AS NEEDED FOR ANXIETY Patient taking differently: TAKE 1/2 TO 1 TABLET (0.5 - 1 MG)  BY MOUTH AT BEDTIME AS NEEDED FOR ANXIETY 05/05/16   Laurey Morale, MD  DULoxetine (CYMBALTA) 30 MG capsule Take 90 mg by mouth at bedtime.  04/17/15   [provider]  ezetimibe (ZETIA) 10 MG tablet Take 10 mg by mouth at bedtime.  09/16/16   [provider]  gabapentin (NEURONTIN) 100 MG capsule Take 200 mg two times a day Patient taking differently: 300 mg 2 (two) times daily. Take 200 mg two times a day 05/18/17   [name] D, NP  gabapentin (NEURONTIN) 400 MG capsule Take 1 capsule (400 mg total) by mouth at bedtime. Patient taking differently: Take 600 mg by mouth at bedtime.  05/18/17   Everitt Amber, MD  glucagon (GLUCAGON EMERGENCY) 1 MG injection Inject 1 mg into the muscle once as needed (low blood sugar).    [provider]  hydrocortisone cream 1 % Apply topically 2 (two) times daily. Patient taking differently: Apply 1 application topically 2 (two) times daily as needed for itching.  05/02/17   Everitt Amber, MD  Insulin Human (INSULIN PUMP) SOLN Inject into the skin continuous. Use with Novolog    [provider]  losartan-hydrochlorothiazide (HYZAAR) 50-12.5 MG tablet TAKE 1 TABLET BY MOUTH EVERY DAY 04/12/17   Laurey Morale, MD  morphine (MS CONTIN) 30 MG 12 hr tablet Take 1 tablet (30 mg total) by mouth every 12 (twelve) hours. 05/26/17 06/25/17  Heath Lark, MD  morphine (MSIR) 30 MG tablet Take 1 tablet (30 mg total) by mouth every 4 (four) hours as needed for severe pain. 05/26/17 08/24/17  Heath Lark, MD  senna (SENOKOT) 8.6 MG TABS tablet Take 1 tablet (8.6 mg total) by mouth at bedtime. 05/02/17   Everitt Amber, MD    SUMAtriptan (IMITREX) 100 MG tablet TAKE 1 TABLET BY MOUTH AS NEEDED FOR MIGRAINES. MAY REPEAT IN 2 HOURS IF NEEDED Patient taking differently: Take 100 mg by mouth every 2 (two) hours as needed for migraine.  02/05/17   Laurey Morale, MD  traMADol (ULTRAM) 50 MG tablet TAKE 2 TABLETS BY MOUTH EVERY 4 HOURS AS NEEDED Patient taking differently: TAKE 1 TABLET (50 MG) BY MOUTH EVERY 4 HOURS AS NEEDED FOR PAIN 05/06/17   Laurey Morale, MD  traZODone (DESYREL) 100 MG tablet Take 100 mg by mouth at bedtime.  04/15/15   [provider]  Vitamin D, Ergocalciferol, (DRISDOL) 50000 units CAPS capsule Take 50,000 Units by mouth every Sunday.    [provider]     Family History  Problem Relation Age of Onset  . Alcohol abuse Father        Died age 40  . Heart disease Father   . Aneurysm Mother        Died age 89  . Leukemia Brother        Died age 35  . COPD Sister   . Rheum  arthritis Sister   . Heart failure Sister   . Dementia Maternal Uncle   . Colon cancer Neg Hx     Social History   Socioeconomic History  . Marital status: Divorced    Spouse name: Not on file  . Number of children: 1  . Years of education: Not on file  . Highest education level: Not on file  Occupational History  . 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 Financial resource strain: Not on file  . Food insecurity:    Worry: Not on file    Inability: Not on file  . Transportation needs:    Medical: Not on file    Non-medical: Not on file  Tobacco Use  . Smoking status: Never Smoker  . Smokeless tobacco: Never Used  Substance and Sexual Activity  . Alcohol use: No    Alcohol/week: 0.0 oz  . Drug use: No  . Sexual activity: Not Currently    Birth control/protection: Post-menopausal  Lifestyle  . Physical activity:    Days per week: Not on file    Minutes per session: Not on file  . Stress: Not on file  Relationships  . Social connections:    Talks on phone: Not on file     Gets together: Not on file    Attends religious service: Not on file    Active member of club or organization: Not on file    Attends meetings of clubs or organizations: Not on file    Relationship status: Not on file  Other Topics Concern  . Not on file  Social History Narrative   Divorced.  Son lives with her.  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     Vital Signs: BP 133/84   Pulse 76   Temp 97.9 F (36.6 C) (Oral)   Resp 14   Ht 5\' 5"  (1.651 m)   Wt 215 lb (97.5 kg)   SpO2 97%   BMI 35.78 kg/m   Physical Exam patient awake, alert.  Left retroperitoneal/transgluteal drain intact, insertion site okay, mildly tender.  Small amount of light yellow fluid in drain bag.  Imaging: No results found.  Labs:  CBC: Recent Labs    05/01/17 1040 05/02/17 0509 05/07/17 1257 05/21/17 0804  WBC 5.8 5.6 10.6* 10.3  HGB 12.0 12.8 13.6 12.8  HCT 37.4 39.7 41.2 38.8  PLT 187 188 203 368    COAGS: Recent Labs    04/29/17 1654 05/07/17 1257 05/21/17 0804  INR 0.94 0.95 1.01    BMP: Recent Labs    04/29/17 1654 05/01/17 1040 05/02/17 0509 05/21/17 0804  NA 137 142 141 135  K 4.1 4.0 3.9 4.1  CL 100* 103 103 96*  CO2 28 32 31 25  GLUCOSE 351* 50* 83 224*  BUN 15 16 17 10   CALCIUM 9.2 8.7* 8.7* 8.9  CREATININE 0.86 0.89 0.77 0.94  GFRNONAA >60 >60 >60 >60  GFRAA >60 >60 >60 >60    LIVER FUNCTION TESTS: Recent Labs    10/29/16 1105 04/08/17 1225 04/29/17 1654  BILITOT 0.4 0.2* 0.9  AST 20 39 65*  ALT 19 45 89*  ALKPHOS 88 90 95  PROT 6.2 6.4* 6.8  ALBUMIN 3.6 3.1* 3.3*    Assessment: 67 year old female with past medical history of robotic-assisted laparoscopic total hysterectomy with left salpingo-oophorectomy, sentinel lymph node biopsy, bilateral periaortic lymphadenectomy and 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 She subsequently developed  intra-abdominal fluid collections and had a left and right abdominal  drain placed 04/30/17.  The left drain required exchange and right drain replaced.  Ultimately she also required a third drain placed on the right 05/21/17.  She has since undergone removal of both right and left superior retroperitoneal drains on 06/03/2017.  She presents again today for follow-up CT and assessment of remaining left retroperitoneal/TG drain.  She is currently afebrile and with minimal output from existing drain.  Follow-up CT today shows persistent fluid around the existing left transgluteal drain.  Images were reviewed by Dr. Earleen Newport.  He recommends maintaining existing drain for now and obtaining follow-up CT in 2 weeks.  Patient will continue to flush drain once daily and record output. She was told to call our service with any additional questions.  Signed: D. Rowe Robert, PA-C 06/09/2017, 3:42 PM   Please refer to Dr.Wagner's attestation of this note for management and plan.      Patient ID: Katie Palmer, female   DOB: [birth year], 67 y.o.   MRN: [id number]

## 2017-06-10 NOTE — Telephone Encounter (Signed)
Entered in error

## 2017-06-11 ENCOUNTER — Encounter: Payer: Self-pay | Admitting: *Deleted

## 2017-06-11 ENCOUNTER — Telehealth: Payer: Self-pay | Admitting: Oncology

## 2017-06-11 NOTE — Telephone Encounter (Signed)
Left a message for [facility] yesterday and today about her upcoming appointments.  Requested a return call.

## 2017-06-11 NOTE — Progress Notes (Signed)
Katie Palmer Psychosocial Distress Screening Clinical Social Work  Clinical Social Work was referred by distress screening protocol.  The patient scored a 10 on the Psychosocial Distress Thermometer which indicates severe distress. Clinical Social Worker attempted to contact patient to assess for distress and other psychosocial needs. CSW left voicemail requesting patient return call.  ONCBCN DISTRESS SCREENING 05/31/2017  Screening Type Initial Screening  Distress experienced in past week (1-10) 10  Practical problem type Work/school;Transportation  Emotional problem type Depression;Nervousness/Anxiety;Adjusting to illness;Feeling hopeless;Isolation/feeling alone  Physical Problem type Getting around;Breathing;Constipation/diarrhea;Swollen arms/legs    Lauren Alver Sorrow, MSW, LCSW, OSW-C Clinical Social Worker [facility] [phone]

## 2017-06-14 ENCOUNTER — Inpatient Hospital Stay: Payer: Medicare Other

## 2017-06-14 ENCOUNTER — Inpatient Hospital Stay: Payer: Medicare Other | Admitting: Hematology and Oncology

## 2017-06-14 ENCOUNTER — Other Ambulatory Visit: Payer: Self-pay | Admitting: Hematology and Oncology

## 2017-06-14 ENCOUNTER — Telehealth: Payer: Self-pay

## 2017-06-14 DIAGNOSIS — C541 Malignant neoplasm of endometrium: Secondary | ICD-10-CM

## 2017-06-14 NOTE — Telephone Encounter (Signed)
Called pt per Dr.Gorsuch to notify her that she does not need to come for her appt today d/t surgical drain in her abdomen still present. Dr.Gorsuch would like for pt to wait until surgical drain and pelvic collection resolved. Pt states that she was told by her dr that it would take about 1 month for the drain to remain at this time. She is to call our office with updates in the next few weeks. Pt aware of all schedule cancellation at this time.

## 2017-06-15 ENCOUNTER — Other Ambulatory Visit: Payer: Self-pay | Admitting: Hematology and Oncology

## 2017-06-15 ENCOUNTER — Other Ambulatory Visit: Payer: Self-pay | Admitting: Gynecologic Oncology

## 2017-06-15 DIAGNOSIS — M79605 Pain in left leg: Secondary | ICD-10-CM

## 2017-06-15 DIAGNOSIS — M792 Neuralgia and neuritis, unspecified: Secondary | ICD-10-CM

## 2017-06-16 ENCOUNTER — Other Ambulatory Visit: Payer: Self-pay | Admitting: Hematology and Oncology

## 2017-06-16 ENCOUNTER — Telehealth: Payer: Self-pay

## 2017-06-16 DIAGNOSIS — M79605 Pain in left leg: Secondary | ICD-10-CM

## 2017-06-16 MED ORDER — MORPHINE SULFATE 30 MG PO TABS: 30.0000 mg | ORAL_TABLET | Freq: Four times a day (QID) | ORAL | 0 refills | Status: DC | PRN

## 2017-06-16 MED ORDER — SENNA 8.6 MG PO TABS: 1.0000 | ORAL_TABLET | Freq: Every day | ORAL | 0 refills | Status: DC

## 2017-06-16 MED FILL — MORPHINE SULFATE IR 30 MG T: 30 | 22 days supply | Qty: 90 | Fill #0

## 2017-06-16 NOTE — Telephone Encounter (Signed)
Called regarding mychart message for refill. Morphine 30 mg refill ready for pick up at [facility]. Per Dr. Alvy Bimler she is not going to refill MS Contin, she dissussed taper with patient previously. She verbalized understanding.

## 2017-06-17 ENCOUNTER — Other Ambulatory Visit: Payer: Medicare Other

## 2017-06-17 ENCOUNTER — Ambulatory Visit: Payer: Medicare Other

## 2017-06-17 ENCOUNTER — Ambulatory Visit: Payer: Medicare Other | Admitting: Hematology and Oncology

## 2017-06-19 ENCOUNTER — Ambulatory Visit: Payer: Medicare Other

## 2017-06-24 ENCOUNTER — Ambulatory Visit
Admission: RE | Admit: 2017-06-24 | Discharge: 2017-06-24 | Disposition: A | Discharge status: Home / Self Care | Payer: Medicare Other | Source: Ambulatory Visit | Attending: Hematology and Oncology | Admitting: Hematology and Oncology

## 2017-06-24 ENCOUNTER — Encounter: Payer: Self-pay | Admitting: Radiology

## 2017-06-24 ENCOUNTER — Other Ambulatory Visit: Payer: Self-pay | Admitting: Hematology and Oncology

## 2017-06-24 ENCOUNTER — Encounter: Payer: Self-pay | Admitting: Oncology

## 2017-06-24 DIAGNOSIS — N739 Female pelvic inflammatory disease, unspecified: Secondary | ICD-10-CM

## 2017-06-24 DIAGNOSIS — C541 Malignant neoplasm of endometrium: Secondary | ICD-10-CM

## 2017-06-24 HISTORY — PX: IR RADIOLOGIST EVAL & MGMT: IMG5224

## 2017-06-24 MED ORDER — IOPAMIDOL (ISOVUE-300) INJECTION 61%
100.0000 mL | Freq: Once | INTRAVENOUS | Status: AC | PRN
  Administered 2017-06-24: 100 mL via INTRAVENOUS

## 2017-06-24 NOTE — Progress Notes (Signed)
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  History of Present Illness: Katie Palmer is a 67 y.o. female status post 3 separate CT-guided drainage procedures for percutaneous drainage of large symptomatic retroperitoneal lymphoceles after hysterectomy and lymph node dissection.  She now has a single remaining left retroperitoneal drain in place.  Drainage has reduced with now approximately 10 mL of tannish-brown fluid in a gravity drainage bag representing drainage over the last 3 days.  She complains of persistent cramping abdominal pain and pelvic pain.  She denies fever.  She has been eating and drinking normally.  She is awaiting removal of the drain, when appropriate, in order to be able to start chemotherapy under the care of Dr. Alvy Bimler.  Past Medical History:  Diagnosis Date  . Depression   . Fibromyalgia   . Headache(784.0)   . History of colon polyps   . History of diabetes with ketoacidosis   . Hx of colonic polyps   . Hypertension   . Insomnia   . Insulin pump in place   . RBBB (right bundle branch block)   . RLQ abdominal pain   . Thickened endometrium   . Type 1 diabetes mellitus with long-term current use of insulin [facility]) endocrinologist-- dr Chalmers Cater   July 2015--  pt took Invokana and caused complete pancreas shut-down , pt went from pre-diabetic to adult type 1 dm now insulin dependent  . Vertigo     Past Surgical History:  Procedure Laterality Date  . CARDIOVASCULAR STRESS TEST  07/26/2015   normal myocardial perfusion study w/ no ischemia/  normal LV function and wall motion , ef 71%  . COLONOSCOPY WITH PROPOFOL  last one 10/ 2017  . HYSTEROSCOPY W/D&C N/A 12/25/2015   Procedure: DILATATION AND CURETTAGE /HYSTEROSCOPY;  Surgeon: Bobbye Charleston, MD;  Location: [facility];  Service: Gynecology;  Laterality: N/A;  . IR RADIOLOGIST EVAL & MGMT  05/06/2017   . IR RADIOLOGIST EVAL & MGMT  05/18/2017  . IR RADIOLOGIST EVAL & MGMT  05/13/2017  . IR RADIOLOGIST EVAL & MGMT  06/03/2017  . IR RADIOLOGIST EVAL & MGMT  06/09/2017  . IR RADIOLOGIST EVAL & MGMT  06/24/2017  . LAPAROSCOPIC LYSIS OF ADHESIONS N/A 12/25/2015   Procedure: LAPAROSCOPIC LYSIS OF ADHESIONS;  Surgeon: Bobbye Charleston, MD;  Location: [place];  Service: Gynecology;  Laterality: N/A;  . LAPAROSCOPIC UNILATERAL SALPINGO OOPHERECTOMY Right 12/25/2015   Procedure: LAPAROSCOPIC UNILATERAL SALPINGO OOPHORECTOMY;  Surgeon: Bobbye Charleston, MD;  Location: [place];  Service: Gynecology;  Laterality: Right;  . LAPAROSCOPY N/A 12/25/2015   Procedure: LAPAROSCOPY OPERATIVE;  Surgeon: Bobbye Charleston, MD;  Location: [facility];  Service: Gynecology;  Laterality: N/A;  . LYMPH NODE BIOPSY N/A 04/15/2017   Procedure: SENTINEL LYMPH NODE BIOPSY, PERIAORTIC LYMPH NODES, AND VAGINAL BIOPSY;  Surgeon: Everitt Amber, MD;  Location: [facility];  Service: Gynecology;  Laterality: N/A;  . ROBOTIC ASSISTED TOTAL HYSTERECTOMY WITH BILATERAL SALPINGO OOPHERECTOMY Bilateral 04/15/2017   Procedure: XI ROBOTIC ASSISTED TOTAL LAPROSCOPIC  HYSTERECTOMY WITH LEFT SALPINGO OOPHORECTOMY;  Surgeon: Everitt Amber, MD;  Location: [facility];  Service: Gynecology;  Laterality: Bilateral;  . TONSILLECTOMY  age 46  . TRANSTHORACIC ECHOCARDIOGRAM  07/26/2015   grade 1 diastolic dysfunction , ef 60-65%/  trivial TR    Allergies: Armodafinil; Invokana [canagliflozin]; Lasix [furosemide]; and Adhesive [tape]  Medications: Prior to Admission medications   Medication Sig Start Date  End Date Taking? Authorizing Provider  Acetaminophen (TYLENOL PO) Take 1 tablet by mouth every 6 (six) hours as needed (pain/headache).    [provider]  ALPRAZolam (XANAX) 1 MG tablet TAKE 1/2 TO 1 TABLET BY MOUTH AT BEDTIME AS NEEDED FOR ANXIETY Patient taking differently: TAKE 1/2 TO 1 TABLET  (0.5 - 1 MG)  BY MOUTH AT BEDTIME AS NEEDED FOR ANXIETY 05/05/16   Laurey Morale, MD  DULoxetine (CYMBALTA) 30 MG capsule Take 90 mg by mouth at bedtime.  04/17/15   [provider]  ezetimibe (ZETIA) 10 MG tablet Take 10 mg by mouth at bedtime.  09/16/16   [provider]  gabapentin (NEURONTIN) 100 MG capsule Take 200 mg two times a day Patient taking differently: 300 mg 2 (two) times daily. Take 200 mg two times a day 05/18/17   [name] D, NP  gabapentin (NEURONTIN) 400 MG capsule Take 1 capsule (400 mg total) by mouth at bedtime. Patient taking differently: Take 600 mg by mouth at bedtime.  05/18/17   Everitt Amber, MD  glucagon (GLUCAGON EMERGENCY) 1 MG injection Inject 1 mg into the muscle once as needed (low blood sugar).    [provider]  hydrocortisone cream 1 % Apply topically 2 (two) times daily. Patient taking differently: Apply 1 application topically 2 (two) times daily as needed for itching.  05/02/17   Everitt Amber, MD  Insulin Human (INSULIN PUMP) SOLN Inject into the skin continuous. Use with Novolog    [provider]  losartan-hydrochlorothiazide (HYZAAR) 50-12.5 MG tablet TAKE 1 TABLET BY MOUTH EVERY DAY 04/12/17   Laurey Morale, MD  morphine (MSIR) 30 MG tablet Take 1 tablet (30 mg total) by mouth every 6 (six) hours as needed for severe pain. 06/16/17 09/14/17  Heath Lark, MD  senna (SENOKOT) 8.6 MG TABS tablet Take 1 tablet (8.6 mg total) by mouth at bedtime. 06/16/17   Cross, Lenna Sciara D, NP  SUMAtriptan (IMITREX) 100 MG tablet TAKE 1 TABLET BY MOUTH AS NEEDED FOR MIGRAINES. MAY REPEAT IN 2 HOURS IF NEEDED Patient taking differently: Take 100 mg by mouth every 2 (two) hours as needed for migraine.  02/05/17   Laurey Morale, MD  traMADol (ULTRAM) 50 MG tablet TAKE 2 TABLETS BY MOUTH EVERY 4 HOURS AS NEEDED Patient taking differently: TAKE 1 TABLET (50 MG) BY MOUTH EVERY 4 HOURS AS NEEDED FOR PAIN 05/06/17   Laurey Morale, MD  traZODone  (DESYREL) 100 MG tablet Take 100 mg by mouth at bedtime.  04/15/15   [provider]  Vitamin D, Ergocalciferol, (DRISDOL) 50000 units CAPS capsule Take 50,000 Units by mouth every Sunday.    [provider]     Family History  Problem Relation Age of Onset  . Alcohol abuse Father        Died age 13  . Heart disease Father   . Aneurysm Mother        Died age 78  . Leukemia Brother        Died age 69  . COPD Sister   . Rheum arthritis Sister   . Heart failure Sister   . Dementia Maternal Uncle   . Colon cancer Neg Hx     Social History   Socioeconomic History  . Marital status: Divorced    Spouse name: Not on file  . Number of children: 1  . Years of education: Not on file  . Highest education level: Not on file  Occupational History  . 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 Financial resource strain: Not on file  . Food insecurity:    Worry: Not on file    Inability: Not on file  . Transportation needs:    Medical: Not on file    Non-medical: Not on file  Tobacco Use  . Smoking status: Never Smoker  . Smokeless tobacco: Never Used  Substance and Sexual Activity  . Alcohol use: No    Alcohol/week: 0.0 oz  . Drug use: No  . Sexual activity: Not Currently    Birth control/protection: Post-menopausal  Lifestyle  . Physical activity:    Days per week: Not on file    Minutes per session: Not on file  . Stress: Not on file  Relationships  . Social connections:    Talks on phone: Not on file    Gets together: Not on file    Attends religious service: Not on file    Active member of club or organization: Not on file    Attends meetings of clubs or organizations: Not on file    Relationship status: Not on file  Other Topics Concern  . Not on file  Social History Narrative   Divorced.  Son lives with her.  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    ECOG Status: 1 - Symptomatic but completely ambulatory  Review  of Systems: A 12 point ROS discussed and pertinent positives are indicated in the HPI above.  All other systems are negative.  Review of Systems  Constitutional: Positive for activity change. Negative for chills and fever.  Respiratory: Negative.   Cardiovascular: Negative.   Gastrointestinal: Positive for abdominal pain. Negative for diarrhea, nausea and vomiting.  Genitourinary: Positive for pelvic pain. Negative for difficulty urinating, dysuria and hematuria.  Musculoskeletal: Negative.   Neurological: Negative.     Vital Signs: BP 124/76   Pulse 80   Temp 98.5 F (36.9 C) (Oral)   Resp 15   SpO2 97%   Physical Exam  Constitutional: She is oriented to person, place, and time. No distress.  Abdominal: Soft. She exhibits no distension.  Exit site of left posterior retroperitoneal drain clean and dry.  No drainage around exit site.  Neurological: She is alert and oriented to person, place, and time.  Skin: Skin is warm and dry. She is not diaphoretic.  Vitals reviewed.   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 Status post postoperative retroperitoneal lymphocele drainage with placement of right and left retroperitoneal drains on 04/30/2017 and additional left retroperitoneal drainage catheter placement on 05/21/2017. Currently with single remaining left-sided retroperitoneal drainage catheter. EXAM: CT ABDOMEN AND PELVIS WITH CONTRAST TECHNIQUE: Multidetector CT imaging of the abdomen and pelvis was performed using the standard protocol following bolus administration of intravenous contrast. CONTRAST:  190mL ISOVUE-300 IOPAMIDOL (ISOVUE-300) INJECTION 61% COMPARISON:  06/09/2017 FINDINGS: Lower chest: No acute abnormality. Hepatobiliary: No focal liver abnormality is seen. No gallstones, gallbladder wall thickening, or biliary dilatation. Pancreas: Stable  atrophic pancreas without evidence of mass or inflammation. Spleen: Normal in size without focal abnormality. Adrenals/Urinary Tract: Adrenal glands are unremarkable. Stable mild left hydronephrosis and hydroureter. No evidence of right-sided hydronephrosis. Bladder is unremarkable. Stomach/Bowel: No evidence of bowel obstruction or inflammation. No free air. Vascular/Lymphatic: At the level of the single remaining left-sided retroperitoneal drain, no further residual left retroperitoneal  fluid collection is identified. There is recurrence of a small right-sided retroperitoneal fluid collection lateral to the right common iliac artery and iliac vein measuring approximately 1.9 cm in greatest diameter at the level of the iliac crest. At the level of the lower pole of the right kidney, a tiny 9 mm fluid collection is seen just lateral to the proximal ureter. In the left pelvis, there are 2 tiny residual lymphoceles adjacent to the left external iliac artery measuring approximately 11 mm and 10 mm in respective diameters. Reproductive: Status post hysterectomy. No adnexal masses. Other: No abdominal wall hernia or abnormality. No abdominopelvic ascites. Musculoskeletal: No acute or significant osseous findings. IMPRESSION: Resolution of left-sided retroperitoneal lymphocele at the level of the single remaining retroperitoneal drainage catheter. Small residual lymphoceles are identified in the right retroperitoneum lateral to the right common iliac artery and vein measuring approximately 1.9 cm in greatest diameter and adjacent to the lower pole of the right kidney measuring 9 mm. Two other small residual lymphoceles are seen adjacent to the left external iliac artery measuring 11 mm and 10 mm respectively. All of these residual small collections are far smaller than the original collections present postoperatively on the 04/29/2017 scan and are currently too small to warrant drainage. The left retroperitoneal drain was  removed today. See separately dictated clinic note in Epic. Electronically Signed   By: Aletta Edouard M.D.   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 She returns today for surveillance CT with left retroperitoneal drain in place. EXAM: CT ABDOMEN AND PELVIS WITH CONTRAST TECHNIQUE: Multidetector CT imaging of the abdomen and pelvis was performed using the standard protocol following bolus administration of intravenous contrast. CONTRAST:  140mL ISOVUE-300 IOPAMIDOL (ISOVUE-300) INJECTION 61% COMPARISON:  Multiple priors, most recent 06/03/2017 FINDINGS: Lower chest: No acute abnormality. Hepatobiliary: Unchanged appearance of liver parenchyma. Focal hypodensity at the falciform ligament is unchanged in greatest diameter over time, measuring 19 mm today and approximately 20 mm on prior CT studies as far back as 03/26/2017, compatible with focal fat. Unremarkable gallbladder. Pancreas: Unremarkable pancreas Spleen: Unremarkable spleen Adrenals/Urinary Tract: Unremarkable adrenal glands. Unremarkable right kidney with no hydronephrosis or nephrolithiasis. Left kidney demonstrates minimal fullness in the collecting system with mild dilation of the length of the ureter. The ureter is inseparable from inflammatory changes adjacent to the pigtail drainage catheter, persistent fluid collection in the proximal segment. No stones. Distal ureter at the ureterovesical junction is decompressed. Unremarkable urinary bladder. Stomach/Bowel: Unremarkable stomach with small hiatal hernia. Unremarkable small bowel with no transition point. No focal inflammatory changes. Normal appendix. Moderate stool burden with no abnormal distention. 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 Vascular/Lymphatic: No significant atherosclerotic  calcifications. No aneurysm or dissection. Bilateral common iliac arteries are patent. Hypogastric arteries are patent. Bilateral proximal femoral arteries are patent. No mesenteric adenopathy. There are small lymph nodes in the periaortic/preaortic nodal stations. Reproductive: Surgical changes of hysterectomy and oophorectomy. Other: Left flank pigtail drainage catheter is unchanged, terminating in small residual rim enhancing fluid collection interposed between the psoas muscle and the iliac arteries/infrarenal abdominal aorta. The greatest diameter of the fluid measures 3.1 cm on axial images, 5.8 cm on coronal images. Compare to the prior CT there has been resolution of the low-density fluid adjacent to the catheter within the psoas muscle. Interval removal of the right flank/retroperitoneal drainage catheter. Musculoskeletal: No acute  displaced fracture. Grade 1 anterolisthesis of L4 on L5. IMPRESSION: Unchanged position of left retroperitoneal pigtail drainage catheter, with decreasing size of residual rim enhancing fluid collection/abscess. Interval removal of retroperitoneal pigtail drainage catheter from the right. Mild dilation of the left collecting system and left ureter, with the proximal left ureter inseparable from the inflammatory changes adjacent to the pigtail catheter. I would favor this not to represent ureteral obstruction, but instead reactive dilation. Electronically Signed   By: Corrie Mckusick D.O.   On: 06/09/2017 15:45   Ct Abdomen Pelvis W Contrast  Result Date: 06/03/2017 CLINICAL DATA:  Multiple drains after hysterectomy. EXAM: CT ABDOMEN AND PELVIS WITH CONTRAST TECHNIQUE: Multidetector CT imaging of the abdomen and pelvis was performed using the standard protocol following bolus administration of intravenous contrast. CONTRAST:  179mL ISOVUE-300 IOPAMIDOL (ISOVUE-300) INJECTION 61% COMPARISON:  05/18/2017 FINDINGS: Lower chest: No acute abnormality. Hepatobiliary: Mild prominence of  the intrahepatic biliary tree is of unknown significance. Gallbladder is unremarkable. No liver mass. Pancreas: Unremarkable Spleen: Unremarkable Adrenals/Urinary Tract: Kidneys and adrenal glands are within normal limits. Hydronephrosis seen previously has improved. Bladder is decompressed. Stomach/Bowel: No obvious mass in the colon. No evidence of small-bowel obstruction. Nonspecific wall thickening of the distal esophagus. Stomach and duodenum are within normal limits. Vascular/Lymphatic: No abnormal retroperitoneal adenopathy. There is soft tissue stranding surrounding the infrarenal aorta and proximal common iliac arteries related to the retroperitoneal fluid collections. Reproductive: Uterus is absent.  Adnexa are within normal limits. Other: There are 3 drains in place. An additional left retroperitoneal drain has been placed into the previously described left psoas fluid collection. Previously, the fluid collection measured 4.4 x 4.5 cm. Today, it measures 3.2 x 3.8 cm. The more superior left retroperitoneal drain is stable in position without associated abscess. The right-sided retroperitoneal drain remains coiled. The right-sided fluid collection has resolved. No new fluid collection. Musculoskeletal: No vertebral compression deformity. IMPRESSION: New left retroperitoneal drain has been placed into a fluid collection. This fluid collection has improved from 4.4 x 4.5 cm to 3.2 x 3.8 cm. The other left-sided drain remains in place without associated abscess. The right-sided fluid collection containing a drain has resolved. No new fluid collection. Other incidental findings are noted. Electronically Signed   By: Marybelle Killings M.D.   On: 06/03/2017 14:08   Ir Radiologist Eval & Mgmt  Result Date: 06/24/2017 Please refer to notes tab for details about interventional procedure. (Op Note)  Ir Radiologist Eval & Mgmt  Result Date: 06/09/2017 Please refer to notes tab for details about interventional  procedure. (Op Note)  Ir Radiologist Eval & Mgmt  Result Date: 06/03/2017 Please refer to notes tab for details about interventional procedure. (Op Note)   Labs:  CBC: Recent Labs    05/01/17 1040 05/02/17 0509 05/07/17 1257 05/21/17 0804  WBC 5.8 5.6 10.6* 10.3  HGB 12.0 12.8 13.6 12.8  HCT 37.4 39.7 41.2 38.8  PLT 187 188 203 368    COAGS: Recent Labs    04/29/17 1654 05/07/17 1257 05/21/17 0804  INR 0.94 0.95 1.01    BMP: Recent Labs    04/29/17 1654 05/01/17 1040 05/02/17 0509 05/21/17 0804  NA 137 142 141 135  K 4.1 4.0 3.9 4.1  CL 100* 103 103 96*  CO2 28 32 31 25  GLUCOSE 351* 50* 83 224*  BUN 15 16 17 10   CALCIUM 9.2 8.7* 8.7* 8.9  CREATININE 0.86 0.89 0.77 0.94  GFRNONAA >60 >60 >60 >60  GFRAA >60 >  60 >60 >60    LIVER FUNCTION TESTS: Recent Labs    10/29/16 1105 04/08/17 1225 04/29/17 1654  BILITOT 0.4 0.2* 0.9  AST 20 39 65*  ALT 19 45 89*  ALKPHOS 88 90 95  PROT 6.2 6.4* 6.8  ALBUMIN 3.6 3.1* 3.3*    Assessment and Plan:  CT shows resolution of fluid collection/lymphocele around the current single remaining left retroperitoneal drainage catheter.  Drain output has diminished significantly and the percutaneous drain was removed today in its entirety without difficulty.  There are some small recurrent/residual fluid collections/lymphoceles bilaterally with a 1.9 cm right retroperitoneal collection next to the common iliac vessels, a 0.9 cm collection adjacent to the lower pole of the right kidney and ureter and 2 separate small 10 mm and 11 mm external iliac collections.  These are all too small to warrant any type of repeat intervention with percutaneous drainage at this time.  Mrs. Honda plans to now discuss chemotherapy options with Dr. Alvy Bimler.   Electronically SignedAletta Edouard T 06/24/2017, 12:10 PM   I spent a total of 15 Minutes in face to face in clinical consultation, greater than 50% of which was  counseling/coordinating care post percutaneous catheter drainage of postoperative retroperitoneal lymphoceles.

## 2017-06-25 ENCOUNTER — Telehealth: Payer: Self-pay | Admitting: Hematology and Oncology

## 2017-06-25 ENCOUNTER — Telehealth: Payer: Self-pay | Admitting: Oncology

## 2017-06-25 NOTE — Telephone Encounter (Signed)
Left message for patient regarding upcoming appointments per 5/30 sch message

## 2017-06-25 NOTE — Telephone Encounter (Addendum)
Called Katie Palmer about her upcoming appointments and when she would be able to come in to have her porta cath placed.  She said any day next week is fine.  She also said that she has been dizzy and has had nausea for the past 5 days.  She said it feels like vertigo that she has had in the past and she had something called in at that time but can't remember the name.  She said she feels like she is leaning to the right when she walks.  Discussed with [name], NP and advised patient to go to urgent care or to contact her PCP.  Also advised her to go to the ER over the weekend if it gets worse.  She verbalized agreement and said she would call on Monday if she needs to be seen.

## 2017-06-28 ENCOUNTER — Telehealth: Payer: Self-pay | Admitting: Oncology

## 2017-06-28 NOTE — Telephone Encounter (Signed)
Left a message for patient regarding her port placement on 07/07/17.  Requested a return call.

## 2017-06-30 ENCOUNTER — Telehealth: Payer: Self-pay | Admitting: Oncology

## 2017-06-30 NOTE — Telephone Encounter (Signed)
Called to notify [facility] of appointment and instructions for her port placement on 07/07/17.  She verbalized understanding and agreement.  Asked if she has been getting my previous messages and she said that she her blood sugars have been running 300-400 for the past 3-4 days and has been feeling "out of it" and that her cell phone was not working.  She said her sugar now is 133 and she is feeling better.  Advised her to contact her endocrinologist, Dr. Chalmers Cater, as soon as possible or go to the ER if her sugar goes up again.  She said she is going to call Dr. Almetta Lovely office now.Marland Kitchen

## 2017-07-06 ENCOUNTER — Telehealth: Payer: Self-pay | Admitting: Oncology

## 2017-07-06 ENCOUNTER — Other Ambulatory Visit: Payer: Self-pay | Admitting: Physician Assistant

## 2017-07-06 NOTE — Telephone Encounter (Signed)
Called Katie Palmer to remind her of appointments for tomorrow.  She verbalized agreement and understanding.

## 2017-07-07 ENCOUNTER — Other Ambulatory Visit: Payer: Self-pay | Admitting: Hematology and Oncology

## 2017-07-07 ENCOUNTER — Inpatient Hospital Stay: Payer: Medicare Other | Attending: Gynecologic Oncology

## 2017-07-07 ENCOUNTER — Ambulatory Visit (HOSPITAL_COMMUNITY)
Admission: RE | Admit: 2017-07-07 | Discharge: 2017-07-07 | Disposition: A | Discharge status: Home / Self Care | Payer: Medicare Other | Source: Ambulatory Visit | Attending: Hematology and Oncology | Admitting: Hematology and Oncology

## 2017-07-07 ENCOUNTER — Encounter: Payer: Self-pay | Admitting: Oncology

## 2017-07-07 ENCOUNTER — Inpatient Hospital Stay: Payer: Medicare Other

## 2017-07-07 ENCOUNTER — Encounter: Payer: Self-pay | Admitting: Hematology and Oncology

## 2017-07-07 ENCOUNTER — Inpatient Hospital Stay (HOSPITAL_BASED_OUTPATIENT_CLINIC_OR_DEPARTMENT_OTHER): Payer: Medicare Other | Admitting: Hematology and Oncology

## 2017-07-07 ENCOUNTER — Encounter (HOSPITAL_COMMUNITY): Payer: Self-pay

## 2017-07-07 VITALS — BP 117/86 | HR 80 | Temp 97.4°F | Resp 18 | Ht 65.0 in | Wt 203.0 lb

## 2017-07-07 DIAGNOSIS — R7989 Other specified abnormal findings of blood chemistry: Secondary | ICD-10-CM | POA: Diagnosis not present

## 2017-07-07 DIAGNOSIS — C541 Malignant neoplasm of endometrium: Secondary | ICD-10-CM | POA: Diagnosis present

## 2017-07-07 DIAGNOSIS — Z8249 Family history of ischemic heart disease and other diseases of the circulatory system: Secondary | ICD-10-CM | POA: Insufficient documentation

## 2017-07-07 DIAGNOSIS — Z794 Long term (current) use of insulin: Secondary | ICD-10-CM | POA: Diagnosis not present

## 2017-07-07 DIAGNOSIS — IMO0001 Reserved for inherently not codable concepts without codable children: Secondary | ICD-10-CM

## 2017-07-07 DIAGNOSIS — K5909 Other constipation: Secondary | ICD-10-CM | POA: Insufficient documentation

## 2017-07-07 DIAGNOSIS — E1139 Type 2 diabetes mellitus with other diabetic ophthalmic complication: Secondary | ICD-10-CM

## 2017-07-07 DIAGNOSIS — F411 Generalized anxiety disorder: Secondary | ICD-10-CM | POA: Insufficient documentation

## 2017-07-07 DIAGNOSIS — E109 Type 1 diabetes mellitus without complications: Secondary | ICD-10-CM | POA: Diagnosis not present

## 2017-07-07 DIAGNOSIS — G4700 Insomnia, unspecified: Secondary | ICD-10-CM | POA: Insufficient documentation

## 2017-07-07 DIAGNOSIS — I4510 Unspecified right bundle-branch block: Secondary | ICD-10-CM | POA: Diagnosis not present

## 2017-07-07 DIAGNOSIS — M797 Fibromyalgia: Secondary | ICD-10-CM | POA: Diagnosis not present

## 2017-07-07 DIAGNOSIS — F329 Major depressive disorder, single episode, unspecified: Secondary | ICD-10-CM | POA: Diagnosis not present

## 2017-07-07 DIAGNOSIS — I10 Essential (primary) hypertension: Secondary | ICD-10-CM | POA: Diagnosis not present

## 2017-07-07 HISTORY — PX: IR IMAGING GUIDED PORT INSERTION: IMG5740

## 2017-07-07 LAB — CBC WITH DIFFERENTIAL/PLATELET
Basophils Absolute: 0.1 10*3/uL (ref 0.0–0.1)
Basophils Relative: 1 %
Eosinophils Absolute: 0.2 10*3/uL (ref 0.0–0.5)
Eosinophils Relative: 3 %
HEMATOCRIT: 39.7 % (ref 34.8–46.6)
Hemoglobin: 13.2 g/dL (ref 11.6–15.9)
LYMPHS ABS: 2 10*3/uL (ref 0.9–3.3)
LYMPHS PCT: 25 %
MCH: 27.4 pg (ref 25.1–34.0)
MCHC: 33.2 g/dL (ref 31.5–36.0)
MCV: 82.6 fL (ref 79.5–101.0)
MONOS PCT: 6 %
Monocytes Absolute: 0.5 10*3/uL (ref 0.1–0.9)
NEUTROS ABS: 5.1 10*3/uL (ref 1.5–6.5)
Neutrophils Relative %: 65 %
Platelets: 313 10*3/uL (ref 145–400)
RBC: 4.81 MIL/uL (ref 3.70–5.45)
RDW: 15.2 % — AB (ref 11.2–14.5)
WBC: 7.9 10*3/uL (ref 3.9–10.3)

## 2017-07-07 LAB — COMPREHENSIVE METABOLIC PANEL
ALT: 6 U/L (ref 0–55)
ANION GAP: 11 (ref 3–11)
AST: 14 U/L (ref 5–34)
Albumin: 3.1 g/dL — ABNORMAL LOW (ref 3.5–5.0)
Alkaline Phosphatase: 121 U/L (ref 40–150)
BILIRUBIN TOTAL: 0.4 mg/dL (ref 0.2–1.2)
BUN: 16 mg/dL (ref 7–26)
CO2: 28 mmol/L (ref 22–29)
Calcium: 9.9 mg/dL (ref 8.4–10.4)
Chloride: 100 mmol/L (ref 98–109)
Creatinine, Ser: 1.37 mg/dL — ABNORMAL HIGH (ref 0.60–1.10)
GFR calc Af Amer: 45 mL/min — ABNORMAL LOW (ref >60)
GFR, EST NON AFRICAN AMERICAN: 39 mL/min — AB (ref >60)
Glucose, Bld: 261 mg/dL — ABNORMAL HIGH (ref 70–140)
Potassium: 4 mmol/L (ref 3.5–5.1)
Sodium: 139 mmol/L (ref 136–145)
TOTAL PROTEIN: 7.3 g/dL (ref 6.4–8.3)

## 2017-07-07 LAB — GLUCOSE, CAPILLARY: Glucose-Capillary: 274 mg/dL — ABNORMAL HIGH (ref 65–99)

## 2017-07-07 LAB — CBC
HCT: 39.5 % (ref 36.0–46.0)
HEMOGLOBIN: 12.8 g/dL (ref 12.0–15.0)
MCH: 27.8 pg (ref 26.0–34.0)
MCHC: 32.4 g/dL (ref 30.0–36.0)
MCV: 85.7 fL (ref 78.0–100.0)
PLATELETS: 301 10*3/uL (ref 150–400)
RBC: 4.61 MIL/uL (ref 3.87–5.11)
RDW: 14.6 % (ref 11.5–15.5)
WBC: 9.3 10*3/uL (ref 4.0–10.5)

## 2017-07-07 LAB — PROTIME-INR
INR: 0.96
Prothrombin Time: 12.7 seconds (ref 11.4–15.2)

## 2017-07-07 LAB — APTT: APTT: 31 s (ref 24–36)

## 2017-07-07 MED ORDER — CEFAZOLIN SODIUM-DEXTROSE 2-4 GM/100ML-% IV SOLN
2.0000 g | Freq: Once | INTRAVENOUS | Status: AC
  Administered 2017-07-07: 2 g via INTRAVENOUS

## 2017-07-07 MED ORDER — ONDANSETRON HCL 8 MG PO TABS: 8.0000 mg | ORAL_TABLET | Freq: Three times a day (TID) | ORAL | 1 refills | Status: AC | PRN

## 2017-07-07 MED ORDER — MIDAZOLAM HCL 2 MG/2ML IJ SOLN
INTRAMUSCULAR | Status: AC | PRN
  Administered 2017-07-07 (×2): 1 mg via INTRAVENOUS

## 2017-07-07 MED ORDER — LIDOCAINE HCL (PF) 1 % IJ SOLN
INTRAMUSCULAR | Status: AC | PRN
  Administered 2017-07-07: 5 mL

## 2017-07-07 MED ORDER — HEPARIN SOD (PORK) LOCK FLUSH 100 UNIT/ML IV SOLN
INTRAVENOUS | Status: AC
  Filled 2017-07-07: qty 5

## 2017-07-07 MED ORDER — MIDAZOLAM HCL 2 MG/2ML IJ SOLN
INTRAMUSCULAR | Status: AC
  Filled 2017-07-07: qty 4

## 2017-07-07 MED ORDER — LIDOCAINE-PRILOCAINE 2.5-2.5 % EX CREA: TOPICAL_CREAM | CUTANEOUS | 3 refills | Status: AC

## 2017-07-07 MED ORDER — HEPARIN SOD (PORK) LOCK FLUSH 100 UNIT/ML IV SOLN
INTRAVENOUS | Status: AC | PRN
  Administered 2017-07-07: 500 [IU] via INTRAVENOUS

## 2017-07-07 MED ORDER — FENTANYL CITRATE (PF) 100 MCG/2ML IJ SOLN
INTRAMUSCULAR | Status: AC | PRN
  Administered 2017-07-07 (×2): 50 ug via INTRAVENOUS

## 2017-07-07 MED ORDER — LIDOCAINE HCL (PF) 1 % IJ SOLN
INTRAMUSCULAR | Status: AC
  Filled 2017-07-07: qty 30

## 2017-07-07 MED ORDER — PROCHLORPERAZINE MALEATE 10 MG PO TABS: 10.0000 mg | ORAL_TABLET | Freq: Four times a day (QID) | ORAL | 1 refills | Status: DC | PRN

## 2017-07-07 MED ORDER — SODIUM CHLORIDE 0.9 % IV SOLN
INTRAVENOUS | Status: DC
  Administered 2017-07-07: 13:00:00 via INTRAVENOUS

## 2017-07-07 MED ORDER — FENTANYL CITRATE (PF) 100 MCG/2ML IJ SOLN
INTRAMUSCULAR | Status: AC
  Filled 2017-07-07: qty 2

## 2017-07-07 MED ORDER — CEFAZOLIN SODIUM-DEXTROSE 2-4 GM/100ML-% IV SOLN
INTRAVENOUS | Status: AC
  Administered 2017-07-07: 2 g via INTRAVENOUS
  Filled 2017-07-07: qty 100

## 2017-07-07 MED ORDER — LIDOCAINE-EPINEPHRINE (PF) 1 %-1:200000 IJ SOLN
INTRAMUSCULAR | Status: AC | PRN
  Administered 2017-07-07: 20 mL

## 2017-07-07 MED ORDER — LIDOCAINE-EPINEPHRINE (PF) 2 %-1:200000 IJ SOLN
INTRAMUSCULAR | Status: AC
  Filled 2017-07-07: qty 20

## 2017-07-07 MED ORDER — DEXAMETHASONE 4 MG PO TABS: ORAL_TABLET | ORAL | 0 refills | Status: DC

## 2017-07-07 NOTE — Assessment & Plan Note (Signed)
She has poorly controlled diabetes I recommend reduced dose oral dexamethasone before chemotherapy and dietary modification

## 2017-07-07 NOTE — Progress Notes (Signed)
[facility] OFFICE PROGRESS NOTE  Patient Care Team: Laurey Morale, MD as PCP - General  ASSESSMENT & PLAN:  Endometrial cancer [facility]) We reviewed the NCCN guidelines We discussed the role of chemotherapy. The intent is of curative intent.  We discussed some of the risks, benefits, side-effects of carboplatin & Taxol. Treatment is intravenous, every 3 weeks x 6 cycles  Some of the short term side-effects included, though not limited to, including weight loss, life threatening infections, risk of allergic reactions, need for transfusions of blood products, nausea, vomiting, change in bowel habits, loss of hair, admission to [facility] for various reasons, and risks of death.   Long term side-effects are also discussed including risks of infertility, permanent damage to nerve function, hearing loss, chronic fatigue, kidney damage with possibility needing hemodialysis, and rare secondary malignancy including bone marrow disorders.  The patient is aware that the response rates discussed earlier is not guaranteed.  After a long discussion, patient made an informed decision to proceed with the prescribed plan of care.   Patient education material was dispensed. We discussed premedication with dexamethasone before chemotherapy. 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 ([place]) She has poorly controlled diabetes I recommend reduced dose oral dexamethasone before chemotherapy and dietary modification  Elevated serum creatinine This is likely secondary to dehydration and her fasting state I recommend increase fluid intake as tolerated  Generalized anxiety disorder She has  symptoms of mild generalized anxiety over the prospect of starting chemotherapy soon I reinforced the idea that the patient has been cured with surgery and the intent of treatment is strictly adjuvant in nature I also discussed support group to help her cope with her current illness  Other constipation I recommend laxative therapy as needed   Orders Placed This Encounter  Procedures  . CBC with Differential (Cancer Center Only)    Standing Status:   Standing    Number of Occurrences:   20    Standing Expiration Date:   07/08/2018  . CMP ([facility] only)    Standing Status:   Standing    Number of Occurrences:   20    Standing Expiration Date:   07/08/2018    INTERVAL HISTORY: Please see below for problem oriented charting. She returns today for further evaluation and for chemotherapy consent She continues to have intermittent abdominal pain since her recent surgery and postoperative complication She denies nausea She has mild constipation She has self discontinued Cymbalta and pain medicine She appears significantly anxious today  SUMMARY OF ONCOLOGIC HISTORY: Oncology History   High grade carcinosarcoma     Endometrial cancer ([place])   12/25/2015 Pathology Results    1. Ovary and fallopian tube, right - BENIGN OVARIAN FIBROTHECOMA. - NO ENDOMETRIOSIS OR MALIGNANCY. - BENIGN FALLOPIAN TUBE WITH BENIGN PARATUBAL CYST. 2. Endometrium, curettage - INACTIVE ENDOMETRIUM, BENIGN ENDOMETRIAL POLYP AND BENIGN SQUAMOUS FRAGMENTS. - NO HYPERPLASIA OR MALIGNANCY      12/25/2015 Surgery    PRE-OPERATIVE DIAGNOSIS:  RLQ PAIN, thickened endometrium  POST-OPERATIVE DIAGNOSIS:  R O lesion, adhesions, polyp in uterus  PROCEDURE:  Procedure(s): DILATATION AND CURETTAGE /HYSTEROSCOPY (N/A) LAPAROSCOPY OPERATIVE (N/A) LAPAROSCOPIC LYSIS OF ADHESIONS (N/A) LAPAROSCOPIC UNILATERAL SALPINGO OOPHORECTOMY (Right  01/25/2017 Initial Diagnosis    The patient reports  postmenopausal bleeding since December, 2018. Pap smear showed AGUS.         03/17/2017 Pathology Results    Endometrial biopsy on 03/17/17 showed grade 3 endometrial adenocarcinoma.      03/26/2017 Imaging    Ct abdomen Diffuse endometrial thickening consistent with endometrial carcinoma, with probable focal area of deep myometrial invasion in the anterior fundus.  No evidence of extra uterine tumor extension, lymphadenopathy, or distant metastatic disease.  Two tiny bilateral lower lobe pulmonary nodules, largest measuring 5 mm. These are indeterminate, but likely benign. Recommend follow-up with chest CT without contrast in 3-6 months.      04/15/2017 Surgery    Operation: Robotic-assisted laparoscopic total hysterectomy with left salpingoophorectomy, SLN biopsy, para-aortic lymphadenectomy (bilateral), vaginal biopsy.  Surgeon: Donaciano Eva  Operative Findings:  : 36mm nodule on left proximal (upper) vaginal side wall (benign on frozen). 8cm normal appearing uterus, surgically absent right ovary and tube, normal left ovary somewhat adherent to round ligament. Indurated left external iliac SLN. No other suspicious nodes or apparent extrauterine disease        04/15/2017 Pathology Results    1. Vagina, biopsy - ATYPICAL CELLS, SUSPICIOUS BUT NOT DIAGNOSTIC OF MALIGNANCY. SEE NOTE 2. Lymph node, sentinel, biopsy, right presacral #1 - LYMPH NODE, NEGATIVE FOR MALIGNANCY (0/1) - IMMUNOSTAIN FOR PANKERATIN SHOWS FOCAL NONSPECIFIC LABELING 3. Lymph node, sentinel, biopsy, right presacral #2 - LYMPH NODE, NEGATIVE FOR MALIGNANCY (0/1) - IMMUNOSTAIN FOR PANKERATIN IS NEGATIVE 4. Lymph node, sentinel, biopsy, left external iliac - LYMPH NODE, NEGATIVE FOR MALIGNANCY (0/1) - IMMUNOSTAIN FOR PANKERATIN IS NEGATIVE 5. Lymph node, biopsy, right periaortic - TWO LYMPH NODES, NEGATIVE FOR MALIGNANCY (0/2) 6. Lymph node, biopsy, left periaortic - SEVEN LYMPH NODES, NEGATIVE FOR  MALIGNANCY (0/7) 7. 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 SEE NOTE - TUMOR INVADES A LITTLE OVER HALF OF THE MYOMETRIUM (INVASION DEPTH OF 0.7 CM WHERE MYOMETRIUM IS 1.3 CM THICK) 1. Vagina, biopsy - ATYPICAL CELLS, SUSPICIOUS BUT NOT DIAGNOSTIC OF MALIGNANCY. SEE NOTE 2. Lymph node, sentinel, biopsy, right presacral #1 - LYMPH NODE, NEGATIVE FOR MALIGNANCY (0/1) - IMMUNOSTAIN FOR PANKERATIN SHOWS FOCAL NONSPECIFIC LABELING 3. Lymph node, sentinel, biopsy, right presacral #2 - LYMPH NODE, NEGATIVE FOR MALIGNANCY (0/1) - IMMUNOSTAIN FOR PANKERATIN IS NEGATIVE 4. Lymph node, sentinel, biopsy, left external iliac - LYMPH NODE, NEGATIVE FOR MALIGNANCY (0/1) - IMMUNOSTAIN FOR PANKERATIN IS NEGATIVE 5. Lymph node, biopsy, right periaortic - TWO LYMPH NODES, NEGATIVE FOR MALIGNANCY (0/2) 6. Lymph node, biopsy, left periaortic - SEVEN LYMPH NODES, NEGATIVE FOR MALIGNANCY (0/7) 7. 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 SEE NOTE - TUMOR INVADES A LITTLE OVER HALF OF THE MYOMETRIUM (INVASION DEPTH OF 0.7 CM WHERE MYOMETRIUM IS 1.3 CM THICK)  7. ONCOLOGY TABLE-UTERUS, CARCINOMA OR CARCINOSARCOMA 1 of 4 Duplicate copy FINAL for Cornelio, Tylia [phone]) Microscopic Comment(continued) Specimen: Uterus with left fallopian tube and ovary Procedure: Total hysterectomy with left salpingoophorectomy Lymph node sampling performed: Sentinel and non-sentinel Specimen integrity: Intact Maximum tumor size: 4.2 cm Histologic type: Carcinosarcoma (Malignant mullerian mixed tumor) Grade: High-grade Myometrial invasion: 0.7 cm where myometrium is 1.3 cm in thickness Cervical stromal involvement: Not identified Extent of  involvement of other organs: Not involved  Lymph - vascular invasion: Not identified Peritoneal washings: NA Lymph nodes: Examined: 3 Sentinel 9 Non-sentinel 12 Total Lymph nodes with metastasis: 0 Isolated tumor cells (< 0.2 mm): 0 Micrometastasis: (> 0.2 mm and < 2.0 mm): 0 Macrometastasis: (> 2.0 mm): 0 Extracapsular extension: NA TNM code: pT1b, pN0 FIGO Stage (based on pathologic findings, needs clinical correlation): 1B      04/29/2017 Imaging    CT abdomen Multiloculated bilateral retroperitoneal fluid collections, consistent with postoperative lymphoceles.  No evidence of hydronephrosis or other complication      1/0/1751 - 05/02/2017 [facility] Admission    She was admitted for evaluation of severe pain. CT showed fluid collection and IR was consulted. Drain was placed to drain the fluid collection      04/30/2017 Cancer Staging    Staging form: Corpus Uteri - Carcinoma and Carcinosarcoma, AJCC 8th Edition - Pathologic: Stage IB (pT1b, pN0, cM0) - Signed by Heath Lark, MD on 04/30/2017      04/30/2017 Procedure    Successful abdominal abscess drain x2.      05/06/2017 Imaging    1. Interval retraction of right-sided percutaneous drainage catheter with end now coiled and locked peripheral the grossly unchanged right-sided retroperitoneal fluid collection, presumed postoperative seroma. 2. Unchanged positioning of left-sided percutaneous drainage catheter with end coiled and locked within the peripheral aspect of the grossly unchanged left-sided retroperitoneal fluid collection, presumed postoperative seroma.  PLAN: - The malpositioned right-sided retroperitoneal drainage catheter was removed at the patient's bedside without incident      05/18/2017 Imaging    Right retroperitoneal fluid collections are improved. The right drain has been repositioned into the more anterior fluid collection which is smaller than on the prior study.  The lateral left retroperitoneal fluid  collection has resolved after drainage. The more medial and anterior fluid collection has increased in size. The drain is not coiled in this more medial fluid collection.      06/03/2017 Imaging    New left retroperitoneal drain has been placed into a fluid collection. This fluid collection has improved from 4.4 x 4.5 cm to 3.2 x 3.8 cm.  The other left-sided drain remains in place without associated abscess.  The right-sided fluid collection containing a drain has resolved.  No new fluid collection.  Other incidental findings are noted.      06/09/2017 Imaging    Unchanged position of left retroperitoneal pigtail drainage catheter, with decreasing size of residual rim enhancing fluid collection/abscess.  Interval removal of retroperitoneal pigtail drainage catheter from the right.  Mild dilation of the left collecting system and left ureter, with the proximal left ureter inseparable from the inflammatory changes adjacent to the pigtail catheter. I would favor this not to represent ureteral obstruction, but instead reactive dilation.      06/24/2017 Imaging    Resolution of left-sided retroperitoneal lymphocele at the level of the single remaining retroperitoneal drainage catheter. Small residual lymphoceles are identified in the right retroperitoneum lateral to the right common iliac artery and vein measuring approximately 1.9 cm in greatest diameter and adjacent to the lower pole of the right kidney measuring 9 mm. Two other small residual lymphoceles are seen adjacent to the left external iliac artery measuring 11 mm and 10 mm respectively. All of these residual small collections are far smaller than the original collections present postoperatively on the 04/29/2017 scan and are currently too small to warrant drainage.  The left retroperitoneal drain was removed today. See separately dictated clinic note in Epic.  REVIEW OF SYSTEMS:   Constitutional: Denies fevers, chills or  abnormal weight loss Eyes: Denies blurriness of vision Ears, nose, mouth, throat, and face: Denies mucositis or sore throat Respiratory: Denies cough, dyspnea or wheezes Cardiovascular: Denies palpitation, chest discomfort or lower extremity swelling Skin: Denies abnormal skin rashes Lymphatics: Denies new lymphadenopathy or easy bruising Neurological:Denies numbness, tingling or new weaknesses  All other systems were reviewed with the patient and are negative.  I have reviewed the past medical history, past surgical history, social history and family history with the patient and they are unchanged from previous note.  ALLERGIES:  is allergic to armodafinil; invokana [canagliflozin]; lasix [furosemide]; and adhesive [tape].  MEDICATIONS:  Current Outpatient Medications  Medication Sig Dispense Refill  . Acetaminophen (TYLENOL PO) Take 1 tablet by mouth every 6 (six) hours as needed (pain/headache).    . ALPRAZolam (XANAX) 1 MG tablet TAKE 1/2 TO 1 TABLET BY MOUTH AT BEDTIME AS NEEDED FOR ANXIETY (Patient taking differently: TAKE 1/2 TO 1 TABLET (0.5 - 1 MG)  BY MOUTH AT BEDTIME AS NEEDED FOR ANXIETY) 30 tablet 5  . dexamethasone (DECADRON) 4 MG tablet Take 2 tabs the night before and 2 tabs the morning of chemotherapy, every 3 weeks 30 tablet 0  . ezetimibe (ZETIA) 10 MG tablet Take 10 mg by mouth at bedtime.   12  . gabapentin (NEURONTIN) 100 MG capsule Take 200 mg two times a day (Patient taking differently: 300 mg 2 (two) times daily. Take 200 mg two times a day) 120 capsule 0  . glucagon (GLUCAGON EMERGENCY) 1 MG injection Inject 1 mg into the muscle once as needed (low blood sugar).    . Insulin Human (INSULIN PUMP) SOLN Inject into the skin continuous. Use with Novolog    . lidocaine-prilocaine (EMLA) cream Apply to affected area once 30 g 3  . losartan-hydrochlorothiazide (HYZAAR) 50-12.5 MG tablet TAKE 1 TABLET BY MOUTH EVERY DAY 90 tablet 0  . ondansetron (ZOFRAN) 8 MG tablet Take  1 tablet (8 mg total) by mouth every 8 (eight) hours as needed for refractory nausea / vomiting. Start on day 3 after chemo. 30 tablet 1  . prochlorperazine (COMPAZINE) 10 MG tablet Take 1 tablet (10 mg total) by mouth every 6 (six) hours as needed (Nausea or vomiting). 30 tablet 1  . senna (SENOKOT) 8.6 MG TABS tablet Take 1 tablet (8.6 mg total) by mouth at bedtime. 120 each 0  . SUMAtriptan (IMITREX) 100 MG tablet TAKE 1 TABLET BY MOUTH AS NEEDED FOR MIGRAINES. MAY REPEAT IN 2 HOURS IF NEEDED (Patient taking differently: Take 100 mg by mouth every 2 (two) hours as needed for migraine. ) 30 tablet 5  . traMADol (ULTRAM) 50 MG tablet TAKE 2 TABLETS BY MOUTH EVERY 4 HOURS AS NEEDED (Patient taking differently: TAKE 1 TABLET (50 MG) BY MOUTH EVERY 4 HOURS AS NEEDED FOR PAIN) 360 tablet 1  . traZODone (DESYREL) 100 MG tablet Take 100 mg by mouth at bedtime.   1   No current facility-administered medications for this visit.     PHYSICAL EXAMINATION: ECOG PERFORMANCE STATUS: 1 - Symptomatic but completely ambulatory  Vitals:   07/07/17 0958  BP: 117/86  Pulse: 80  Resp: 18  Temp: (!) 97.4 F (36.3 C)  SpO2: 99%   Filed Weights   07/07/17 0958  Weight: 203 lb (92.1 kg)    GENERAL:alert, no distress and comfortable. She appears anxious SKIN: skin color, texture, turgor are normal, no rashes or significant  lesions EYES: normal, Conjunctiva are pink and non-injected, sclera clear OROPHARYNX:no exudate, no erythema and lips, buccal mucosa, and tongue normal  NECK: supple, thyroid normal size, non-tender, without nodularity LYMPH:  no palpable lymphadenopathy in the cervical, axillary or inguinal LUNGS: clear to auscultation and percussion with normal breathing effort HEART: regular rate & rhythm and no murmurs and no lower extremity edema ABDOMEN:abdomen soft, non-tender and normal bowel sounds Musculoskeletal:no cyanosis of digits and no clubbing  NEURO: alert & oriented x 3 with fluent  speech, no focal motor/sensory deficits  LABORATORY DATA:  I have reviewed the data as listed    Component Value Date/Time   NA 139 07/07/2017 0815   K 4.0 07/07/2017 0815   CL 100 07/07/2017 0815   CO2 28 07/07/2017 0815   GLUCOSE 261 (H) 07/07/2017 0815   BUN 16 07/07/2017 0815   CREATININE 1.37 (H) 07/07/2017 0815   CALCIUM 9.9 07/07/2017 0815   PROT 7.3 07/07/2017 0815   ALBUMIN 3.1 (L) 07/07/2017 0815   AST 14 07/07/2017 0815   ALT 6 07/07/2017 0815   ALKPHOS 121 07/07/2017 0815   BILITOT 0.4 07/07/2017 0815   GFRNONAA 39 (L) 07/07/2017 0815   GFRAA 45 (L) 07/07/2017 0815    No results found for: SPEP, UPEP  Lab Results  Component Value Date   WBC 7.9 07/07/2017   NEUTROABS 5.1 07/07/2017   HGB 13.2 07/07/2017   HCT 39.7 07/07/2017   MCV 82.6 07/07/2017   PLT 313 07/07/2017      Chemistry      Component Value Date/Time   NA 139 07/07/2017 0815   K 4.0 07/07/2017 0815   CL 100 07/07/2017 0815   CO2 28 07/07/2017 0815   BUN 16 07/07/2017 0815   CREATININE 1.37 (H) 07/07/2017 0815      Component Value Date/Time   CALCIUM 9.9 07/07/2017 0815   ALKPHOS 121 07/07/2017 0815   AST 14 07/07/2017 0815   ALT 6 07/07/2017 0815   BILITOT 0.4 07/07/2017 0815       RADIOGRAPHIC STUDIES: I have personally reviewed the radiological images as listed and agreed with the findings in the report. Ct Abdomen Pelvis W Contrast  Result Date: 06/24/2017 CLINICAL DATA:  Status post hysterectomy and lymph node dissection for carcinosarcoma of the endometrium. Status post postoperative retroperitoneal lymphocele drainage with placement of right and left retroperitoneal drains on 04/30/2017 and additional left retroperitoneal drainage catheter placement on 05/21/2017. Currently with single remaining left-sided retroperitoneal drainage catheter. EXAM: CT ABDOMEN AND PELVIS WITH CONTRAST TECHNIQUE: Multidetector CT imaging of the abdomen and pelvis was performed using the standard  protocol following bolus administration of intravenous contrast. CONTRAST:  164mL ISOVUE-300 IOPAMIDOL (ISOVUE-300) INJECTION 61% COMPARISON:  06/09/2017 FINDINGS: Lower chest: No acute abnormality. Hepatobiliary: No focal liver abnormality is seen. No gallstones, gallbladder wall thickening, or biliary dilatation. Pancreas: Stable atrophic pancreas without evidence of mass or inflammation. Spleen: Normal in size without focal abnormality. Adrenals/Urinary Tract: Adrenal glands are unremarkable. Stable mild left hydronephrosis and hydroureter. No evidence of right-sided hydronephrosis. Bladder is unremarkable. Stomach/Bowel: No evidence of bowel obstruction or inflammation. No free air. Vascular/Lymphatic: At the level of the single remaining left-sided retroperitoneal drain, no further residual left retroperitoneal fluid collection is identified. There is recurrence of a small right-sided retroperitoneal fluid collection lateral to the right common iliac artery and iliac vein measuring approximately 1.9 cm in greatest diameter at the level of the iliac crest. At the level of the lower pole of the  right kidney, a tiny 9 mm fluid collection is seen just lateral to the proximal ureter. In the left pelvis, there are 2 tiny residual lymphoceles adjacent to the left external iliac artery measuring approximately 11 mm and 10 mm in respective diameters. Reproductive: Status post hysterectomy. No adnexal masses. Other: No abdominal wall hernia or abnormality. No abdominopelvic ascites. Musculoskeletal: No acute or significant osseous findings. IMPRESSION: Resolution of left-sided retroperitoneal lymphocele at the level of the single remaining retroperitoneal drainage catheter. Small residual lymphoceles are identified in the right retroperitoneum lateral to the right common iliac artery and vein measuring approximately 1.9 cm in greatest diameter and adjacent to the lower pole of the right kidney measuring 9 mm. Two other  small residual lymphoceles are seen adjacent to the left external iliac artery measuring 11 mm and 10 mm respectively. All of these residual small collections are far smaller than the original collections present postoperatively on the 04/29/2017 scan and are currently too small to warrant drainage. The left retroperitoneal drain was removed today. See separately dictated clinic note in Epic. Electronically Signed   By: Aletta Edouard M.D.   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 She returns today for surveillance CT with left retroperitoneal drain in place. EXAM: CT ABDOMEN AND PELVIS WITH CONTRAST TECHNIQUE: Multidetector CT imaging of the abdomen and pelvis was performed using the standard protocol following bolus administration of intravenous contrast. CONTRAST:  161mL ISOVUE-300 IOPAMIDOL (ISOVUE-300) INJECTION 61% COMPARISON:  Multiple priors, most recent 06/03/2017 FINDINGS: Lower chest: No acute abnormality. Hepatobiliary: Unchanged appearance of liver parenchyma. Focal hypodensity at the falciform ligament is unchanged in greatest diameter over time, measuring 19 mm today and approximately 20 mm on prior CT studies as far back as 03/26/2017, compatible with focal fat. Unremarkable gallbladder. Pancreas: Unremarkable pancreas Spleen: Unremarkable spleen Adrenals/Urinary Tract: Unremarkable adrenal glands. Unremarkable right kidney with no hydronephrosis or nephrolithiasis. Left kidney demonstrates minimal fullness in the collecting system with mild dilation of the length of the ureter. The ureter is inseparable from inflammatory changes adjacent to the pigtail drainage catheter, persistent fluid collection in the proximal segment. No stones. Distal ureter at the ureterovesical junction is decompressed. Unremarkable urinary bladder.  Stomach/Bowel: Unremarkable stomach with small hiatal hernia. Unremarkable small bowel with no transition point. No focal inflammatory changes. Normal appendix. Moderate stool burden with no abnormal distention. 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 Vascular/Lymphatic: No significant atherosclerotic calcifications. No aneurysm or dissection. Bilateral common iliac arteries are patent. Hypogastric arteries are patent. Bilateral proximal femoral arteries are patent. No mesenteric adenopathy. There are small lymph nodes in the periaortic/preaortic nodal stations. Reproductive: Surgical changes of hysterectomy and oophorectomy. Other: Left flank pigtail drainage catheter is unchanged, terminating in small residual rim enhancing fluid collection interposed between the psoas muscle and the iliac arteries/infrarenal abdominal aorta. The greatest diameter of the fluid measures 3.1 cm on axial images, 5.8 cm on coronal images. Compare to the prior CT there has been resolution of the low-density fluid adjacent to the catheter within the psoas muscle. Interval removal of the right flank/retroperitoneal drainage catheter. Musculoskeletal: No acute displaced fracture. Grade 1 anterolisthesis of L4 on L5. IMPRESSION: Unchanged position of left retroperitoneal pigtail drainage catheter, with decreasing size of residual rim enhancing fluid collection/abscess. Interval removal of retroperitoneal pigtail drainage catheter from the right. Mild dilation of the left collecting system and left ureter,  with the proximal left ureter inseparable from the inflammatory changes adjacent to the pigtail catheter. I would favor this not to represent ureteral obstruction, but instead reactive dilation. Electronically Signed   By: Corrie Mckusick D.O.   On: 06/09/2017 15:45   [facility] Eval & Mgmt  Result Date: 06/24/2017 Please refer to notes tab for details about interventional procedure. (Op Note)  [facility] Radiologist  Eval & Mgmt  Result Date: 06/09/2017 Please refer to notes tab for details about interventional procedure. (Op Note)   All questions were answered. The patient knows to call the clinic with any problems, questions or concerns. No barriers to learning was detected.  I spent 30 minutes counseling the patient face to face. The total time spent in the appointment was 40 minutes and more than 50% was on counseling and review of test results  Heath Lark, MD 07/07/2017 11:48 AM

## 2017-07-07 NOTE — Assessment & Plan Note (Signed)
I recommend laxative therapy as needed

## 2017-07-07 NOTE — Procedures (Signed)
Interventional Radiology Procedure Note  Procedure: Placement of a right IJ approach single lumen PowerPort.  Tip is positioned at the superior cavoatrial junction and catheter is ready for immediate use.  Complications: No immediate Recommendations:  - Ok to shower tomorrow - Do not submerge for 7 days - Routine line care   Signed,  Obi Scrima K. Olanda Boughner, MD   

## 2017-07-07 NOTE — H&P (Signed)
Chief Complaint: Patient was seen in consultation today for port placement at the request of [place]  Referring Physician(s): Heath Lark  Supervising Physician: Jacqulynn Cadet  Patient Status: [facility] - Out-pt  History of Present Illness: Katie Palmer is a 67 y.o. female with endometrial cancer. She is referred for port placement. PMHx, meds, labs, imaging, allergies reviewed. Feels well, no recent fevers, chills, illness. Has been NPO today as directed. Family at bedside.   Past Medical History:  Diagnosis Date  . Depression   . Fibromyalgia   . Headache(784.0)   . History of colon polyps   . History of diabetes with ketoacidosis   . Hx of colonic polyps   . Hypertension   . Insomnia   . Insulin pump in place   . RBBB (right bundle branch block)   . RLQ abdominal pain   . Thickened endometrium   . Type 1 diabetes mellitus with long-term current use of insulin [facility]) endocrinologist-- dr Chalmers Cater   July 2015--  pt took Invokana and caused complete pancreas shut-down , pt went from pre-diabetic to adult type 1 dm now insulin dependent  . Vertigo     Past Surgical History:  Procedure Laterality Date  . CARDIOVASCULAR STRESS TEST  07/26/2015   normal myocardial perfusion study w/ no ischemia/  normal LV function and wall motion , ef 71%  . COLONOSCOPY WITH PROPOFOL  last one 10/ 2017  . HYSTEROSCOPY W/D&C N/A 12/25/2015   Procedure: DILATATION AND CURETTAGE /HYSTEROSCOPY;  Surgeon: Bobbye Charleston, MD;  Location: [facility];  Service: Gynecology;  Laterality: N/A;  . IR RADIOLOGIST EVAL & MGMT  05/06/2017  . IR RADIOLOGIST EVAL & MGMT  05/18/2017  . IR RADIOLOGIST EVAL & MGMT  05/13/2017  . IR RADIOLOGIST EVAL & MGMT  06/03/2017  . IR RADIOLOGIST EVAL & MGMT  06/09/2017  . IR RADIOLOGIST EVAL & MGMT  06/24/2017  . LAPAROSCOPIC LYSIS OF ADHESIONS N/A 12/25/2015   Procedure: LAPAROSCOPIC LYSIS OF ADHESIONS;  Surgeon: Bobbye Charleston, MD;  Location:  [place];  Service: Gynecology;  Laterality: N/A;  . LAPAROSCOPIC UNILATERAL SALPINGO OOPHERECTOMY Right 12/25/2015   Procedure: LAPAROSCOPIC UNILATERAL SALPINGO OOPHORECTOMY;  Surgeon: Bobbye Charleston, MD;  Location: [place];  Service: Gynecology;  Laterality: Right;  . LAPAROSCOPY N/A 12/25/2015   Procedure: LAPAROSCOPY OPERATIVE;  Surgeon: Bobbye Charleston, MD;  Location: [facility];  Service: Gynecology;  Laterality: N/A;  . LYMPH NODE BIOPSY N/A 04/15/2017   Procedure: SENTINEL LYMPH NODE BIOPSY, PERIAORTIC LYMPH NODES, AND VAGINAL BIOPSY;  Surgeon: Everitt Amber, MD;  Location: [facility];  Service: Gynecology;  Laterality: N/A;  . ROBOTIC ASSISTED TOTAL HYSTERECTOMY WITH BILATERAL SALPINGO OOPHERECTOMY Bilateral 04/15/2017   Procedure: XI ROBOTIC ASSISTED TOTAL LAPROSCOPIC  HYSTERECTOMY WITH LEFT SALPINGO OOPHORECTOMY;  Surgeon: Everitt Amber, MD;  Location: [facility];  Service: Gynecology;  Laterality: Bilateral;  . TONSILLECTOMY  age 50  . TRANSTHORACIC ECHOCARDIOGRAM  07/26/2015   grade 1 diastolic dysfunction , ef 60-65%/  trivial TR    Allergies: Armodafinil; Invokana [canagliflozin]; Lasix [furosemide]; and Adhesive [tape]  Medications: Prior to Admission medications   Medication Sig Start Date End Date Taking? Authorizing Provider  ALPRAZolam (XANAX) 1 MG tablet TAKE 1/2 TO 1 TABLET BY MOUTH AT BEDTIME AS NEEDED FOR ANXIETY Patient taking differently: TAKE 1/2 TO 1 TABLET (0.5 - 1 MG)  BY MOUTH AT BEDTIME AS NEEDED FOR ANXIETY 05/05/16  Yes Laurey Morale, MD  ezetimibe (ZETIA) 10  MG tablet Take 10 mg by mouth at bedtime.  09/16/16  Yes [provider]  gabapentin (NEURONTIN) 100 MG capsule Take 200 mg two times a day Patient taking differently: 300 mg 2 (two) times daily. Take 200 mg two times a day 05/18/17  Yes Cross, [name] D, NP  Insulin Human (INSULIN PUMP) SOLN Inject into the skin continuous. Use with Novolog   Yes  [provider]  losartan-hydrochlorothiazide (HYZAAR) 50-12.5 MG tablet TAKE 1 TABLET BY MOUTH EVERY DAY 04/12/17  Yes Laurey Morale, MD  senna (SENOKOT) 8.6 MG TABS tablet Take 1 tablet (8.6 mg total) by mouth at bedtime. 06/16/17  Yes Cross, [name] D, NP  SUMAtriptan (IMITREX) 100 MG tablet TAKE 1 TABLET BY MOUTH AS NEEDED FOR MIGRAINES. MAY REPEAT IN 2 HOURS IF NEEDED Patient taking differently: Take 100 mg by mouth every 2 (two) hours as needed for migraine.  02/05/17  Yes Laurey Morale, MD  traMADol (ULTRAM) 50 MG tablet TAKE 2 TABLETS BY MOUTH EVERY 4 HOURS AS NEEDED Patient taking differently: TAKE 1 TABLET (50 MG) BY MOUTH EVERY 4 HOURS AS NEEDED FOR PAIN 05/06/17  Yes Laurey Morale, MD  traZODone (DESYREL) 100 MG tablet Take 100 mg by mouth at bedtime.  04/15/15  Yes [provider]  Acetaminophen (TYLENOL PO) Take 1 tablet by mouth every 6 (six) hours as needed (pain/headache).    [provider]  dexamethasone (DECADRON) 4 MG tablet Take 2 tabs the night before and 2 tabs the morning of chemotherapy, every 3 weeks 07/07/17   Heath Lark, MD  glucagon (GLUCAGON EMERGENCY) 1 MG injection Inject 1 mg into the muscle once as needed (low blood sugar).    [provider]  lidocaine-prilocaine (EMLA) cream Apply to affected area once 07/07/17   Heath Lark, MD  ondansetron (ZOFRAN) 8 MG tablet Take 1 tablet (8 mg total) by mouth every 8 (eight) hours as needed for refractory nausea / vomiting. Start on day 3 after chemo. 07/07/17   Heath Lark, MD  prochlorperazine (COMPAZINE) 10 MG tablet TAKE 1 TABLET BY MOUTH EVERY 6 HOURS AS NEEDED FOR NAUSEA OR VOMITING 07/07/17   Heath Lark, MD     Family History  Problem Relation Age of Onset  . Alcohol abuse Father        Died age 17  . Heart disease Father   . Aneurysm Mother        Died age 48  . Leukemia Brother        Died age 76  . COPD Sister   . Rheum arthritis Sister   . Heart failure Sister   . Dementia  Maternal Uncle   . Colon cancer Neg Hx     Social History   Socioeconomic History  . Marital status: Divorced    Spouse name: Not on file  . Number of children: 1  . Years of education: Not on file  . Highest education level: Not on file  Occupational History  . 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 Financial resource strain: Not on file  . Food insecurity:    Worry: Not on file    Inability: Not on file  . Transportation needs:    Medical: Not on file    Non-medical: Not on file  Tobacco Use  . Smoking status: Never Smoker  . Smokeless tobacco: Never Used  Substance and Sexual Activity  . Alcohol use: No    Alcohol/week: 0.0 oz  .  Drug use: No  . Sexual activity: Not Currently    Birth control/protection: Post-menopausal  Lifestyle  . Physical activity:    Days per week: Not on file    Minutes per session: Not on file  . Stress: Not on file  Relationships  . Social connections:    Talks on phone: Not on file    Gets together: Not on file    Attends religious service: Not on file    Active member of club or organization: Not on file    Attends meetings of clubs or organizations: Not on file    Relationship status: Not on file  Other Topics Concern  . Not on file  Social History Narrative   Divorced.  Son lives with her.  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     Review of Systems: A 12 point ROS discussed and pertinent positives are indicated in the HPI above.  All other systems are negative.  Review of Systems  Vital Signs: BP 116/84 (BP Location: Left Arm)   Pulse 91   Temp 98.6 F (37 C) (Oral)   Resp 18   Ht 5\' 5"  (1.651 m)   Wt 203 lb (92.1 kg)   SpO2 98%   BMI 33.78 kg/m   Physical Exam  Constitutional: She is oriented to person, place, and time. She appears well-developed. No distress.  HENT:  Head: Normocephalic.  Mouth/Throat: Oropharynx is clear and moist.  Neck: Normal range of motion. No JVD  present.  Cardiovascular: Normal rate, regular rhythm and normal heart sounds.  Pulmonary/Chest: Effort normal and breath sounds normal. No respiratory distress.  Abdominal: Soft.  Neurological: She is alert and oriented to person, place, and time.  Skin: Skin is warm and dry.  Psychiatric: She has a normal mood and affect. Judgment normal.    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 Status post postoperative retroperitoneal lymphocele drainage with placement of right and left retroperitoneal drains on 04/30/2017 and additional left retroperitoneal drainage catheter placement on 05/21/2017. Currently with single remaining left-sided retroperitoneal drainage catheter. EXAM: CT ABDOMEN AND PELVIS WITH CONTRAST TECHNIQUE: Multidetector CT imaging of the abdomen and pelvis was performed using the standard protocol following bolus administration of intravenous contrast. CONTRAST:  113mL ISOVUE-300 IOPAMIDOL (ISOVUE-300) INJECTION 61% COMPARISON:  06/09/2017 FINDINGS: Lower chest: No acute abnormality. Hepatobiliary: No focal liver abnormality is seen. No gallstones, gallbladder wall thickening, or biliary dilatation. Pancreas: Stable atrophic pancreas without evidence of mass or inflammation. Spleen: Normal in size without focal abnormality. Adrenals/Urinary Tract: Adrenal glands are unremarkable. Stable mild left hydronephrosis and hydroureter. No evidence of right-sided hydronephrosis. Bladder is unremarkable. Stomach/Bowel: No evidence of bowel obstruction or inflammation. No free air. Vascular/Lymphatic: At the level of the single remaining left-sided retroperitoneal drain, no further residual left retroperitoneal fluid collection is identified. There is recurrence of a small right-sided retroperitoneal fluid collection lateral to the right common iliac artery and iliac vein  measuring approximately 1.9 cm in greatest diameter at the level of the iliac crest. At the level of the lower pole of the right kidney, a tiny 9 mm fluid collection is seen just lateral to the proximal ureter. In the left pelvis, there are 2 tiny residual lymphoceles adjacent to the left external iliac artery measuring approximately 11 mm and 10 mm in respective diameters. Reproductive: Status post hysterectomy. No adnexal masses. Other:  No abdominal wall hernia or abnormality. No abdominopelvic ascites. Musculoskeletal: No acute or significant osseous findings. IMPRESSION: Resolution of left-sided retroperitoneal lymphocele at the level of the single remaining retroperitoneal drainage catheter. Small residual lymphoceles are identified in the right retroperitoneum lateral to the right common iliac artery and vein measuring approximately 1.9 cm in greatest diameter and adjacent to the lower pole of the right kidney measuring 9 mm. Two other small residual lymphoceles are seen adjacent to the left external iliac artery measuring 11 mm and 10 mm respectively. All of these residual small collections are far smaller than the original collections present postoperatively on the 04/29/2017 scan and are currently too small to warrant drainage. The left retroperitoneal drain was removed today. See separately dictated clinic note in Epic. Electronically Signed   By: Aletta Edouard M.D.   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 She returns today for surveillance CT with left retroperitoneal drain in place. EXAM: CT ABDOMEN AND PELVIS WITH CONTRAST TECHNIQUE: Multidetector CT imaging of the abdomen and pelvis was performed using the standard protocol following bolus administration of intravenous contrast. CONTRAST:  176mL ISOVUE-300 IOPAMIDOL  (ISOVUE-300) INJECTION 61% COMPARISON:  Multiple priors, most recent 06/03/2017 FINDINGS: Lower chest: No acute abnormality. Hepatobiliary: Unchanged appearance of liver parenchyma. Focal hypodensity at the falciform ligament is unchanged in greatest diameter over time, measuring 19 mm today and approximately 20 mm on prior CT studies as far back as 03/26/2017, compatible with focal fat. Unremarkable gallbladder. Pancreas: Unremarkable pancreas Spleen: Unremarkable spleen Adrenals/Urinary Tract: Unremarkable adrenal glands. Unremarkable right kidney with no hydronephrosis or nephrolithiasis. Left kidney demonstrates minimal fullness in the collecting system with mild dilation of the length of the ureter. The ureter is inseparable from inflammatory changes adjacent to the pigtail drainage catheter, persistent fluid collection in the proximal segment. No stones. Distal ureter at the ureterovesical junction is decompressed. Unremarkable urinary bladder. Stomach/Bowel: Unremarkable stomach with small hiatal hernia. Unremarkable small bowel with no transition point. No focal inflammatory changes. Normal appendix. Moderate stool burden with no abnormal distention. 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 Vascular/Lymphatic: No significant atherosclerotic calcifications. No aneurysm or dissection. Bilateral common iliac arteries are patent. Hypogastric arteries are patent. Bilateral proximal femoral arteries are patent. No mesenteric adenopathy. There are small lymph nodes in the periaortic/preaortic nodal stations. Reproductive: Surgical changes of hysterectomy and oophorectomy. Other: Left flank pigtail drainage catheter is unchanged, terminating in small residual rim enhancing fluid collection interposed between the psoas muscle and the iliac arteries/infrarenal abdominal aorta. The greatest diameter of the fluid measures 3.1 cm on axial images, 5.8 cm on coronal images. Compare to the prior CT  there has been resolution of the low-density fluid adjacent to the catheter within the psoas muscle. Interval removal of the right flank/retroperitoneal drainage catheter. Musculoskeletal: No acute displaced fracture. Grade 1 anterolisthesis of L4 on L5. IMPRESSION: Unchanged position of left retroperitoneal pigtail drainage catheter, with decreasing size of residual rim enhancing fluid collection/abscess. Interval removal of retroperitoneal pigtail drainage catheter from the right. Mild dilation of the left collecting system and left ureter, with the proximal left ureter inseparable from the inflammatory changes adjacent to the pigtail catheter. I would favor this not to represent ureteral obstruction, but instead reactive dilation. Electronically Signed   By: Corrie Mckusick D.O.   On: 06/09/2017 15:45    Labs:  CBC: Recent Labs  05/02/17 0509 05/07/17 1257 05/21/17 0804 07/07/17 0815  WBC 5.6 10.6* 10.3 7.9  HGB 12.8 13.6 12.8 13.2  HCT 39.7 41.2 38.8 39.7  PLT 188 203 368 313    COAGS: Recent Labs    04/29/17 1654 05/07/17 1257 05/21/17 0804 07/07/17 1248  INR 0.94 0.95 1.01 0.96  APTT  --   --   --  31    BMP: Recent Labs    05/01/17 1040 05/02/17 0509 05/21/17 0804 07/07/17 0815  NA 142 141 135 139  K 4.0 3.9 4.1 4.0  CL 103 103 96* 100  CO2 32 31 25 28   GLUCOSE 50* 83 224* 261*  BUN 16 17 10 16   CALCIUM 8.7* 8.7* 8.9 9.9  CREATININE 0.89 0.77 0.94 1.37*  GFRNONAA >60 >60 >60 39*  GFRAA >60 >60 >60 45*    LIVER FUNCTION TESTS: Recent Labs    10/29/16 1105 04/08/17 1225 04/29/17 1654 07/07/17 0815  BILITOT 0.4 0.2* 0.9 0.4  AST 20 39 65* 14  ALT 19 45 89* 6  ALKPHOS 88 90 95 121  PROT 6.2 6.4* 6.8 7.3  ALBUMIN 3.6 3.1* 3.3* 3.1*    TUMOR MARKERS: No results for input(s): AFPTM, CEA, CA199, CHROMGRNA in the last 8760 hours.  Assessment and Plan: Endometrial cancer For port Labs ok Risks and benefits of image guided port-a-catheter placement  was discussed with the patient including, but not limited to bleeding, infection, pneumothorax, or fibrin sheath development and need for additional procedures.  All of the patient's questions were answered, patient is agreeable to proceed. Consent signed and in chart.    Thank you for this interesting consult.  I greatly enjoyed meeting Katie Palmer and look forward to participating in their care.  A copy of this report was sent to the requesting provider on this date.  Electronically Signed: Ascencion Dike, PA-C 07/07/2017, 2:09 PM   I spent a total of 20 minutes in face to face in clinical consultation, greater than 50% of which was counseling/coordinating care for port placement

## 2017-07-07 NOTE — Assessment & Plan Note (Addendum)
We reviewed the NCCN guidelines We discussed the role of chemotherapy. The intent is of curative intent.  We discussed some of the risks, benefits, side-effects of carboplatin & Taxol. Treatment is intravenous, every 3 weeks x 6 cycles  Some of the short term side-effects included, though not limited to, including weight loss, life threatening infections, risk of allergic reactions, need for transfusions of blood products, nausea, vomiting, change in bowel habits, loss of hair, admission to hospital for various reasons, and risks of death.   Long term side-effects are also discussed including risks of infertility, permanent damage to nerve function, hearing loss, chronic fatigue, kidney damage with possibility needing hemodialysis, and rare secondary malignancy including bone marrow disorders.  The patient is aware that the response rates discussed earlier is not guaranteed.  After a long discussion, patient made an informed decision to proceed with the prescribed plan of care.   Patient education material was dispensed. We discussed premedication with dexamethasone before chemotherapy. I will reduce the dose of oral dexamethasone due to poorly controlled diabetes Given her young age, I will not prescribe prophylactic G-CSF support for now I will see her every 3 weeks for toxicity review I plan mildly reduced dose Taxol due to anticipated risk of peripheral neuropathy

## 2017-07-07 NOTE — Telephone Encounter (Signed)
Inappropriate to request 90 days supply for a medication that may not be needed

## 2017-07-07 NOTE — Assessment & Plan Note (Signed)
She has symptoms of mild generalized anxiety over the prospect of starting chemotherapy soon I reinforced the idea that the patient has been cured with surgery and the intent of treatment is strictly adjuvant in nature I also discussed support group to help her cope with her current illness

## 2017-07-07 NOTE — Assessment & Plan Note (Signed)
This is likely secondary to dehydration and her fasting state I recommend increase fluid intake as tolerated

## 2017-07-07 NOTE — Discharge Instructions (Signed)
Moderate Conscious Sedation, Adult, Care After These instructions provide you with information about caring for yourself after your procedure. Your health care provider may also give you more specific instructions. Your treatment has been planned according to current medical practices, but problems sometimes occur. Call your health care provider if you have any problems or questions after your procedure. What can I expect after the procedure? After your procedure, it is common:  To feel sleepy for several hours.  To feel clumsy and have poor balance for several hours.  To have poor judgment for several hours.  To vomit if you eat too soon.  Follow these instructions at home: For at least 24 hours after the procedure:   Do not: ? Participate in activities where you could fall or become injured. ? Drive. ? Use heavy machinery. ? Drink alcohol. ? Take sleeping pills or medicines that cause drowsiness. ? Make important decisions or sign legal documents. ? Take care of children on your own.  Rest. Eating and drinking  Follow the diet recommended by your health care provider.  If you vomit: ? Drink water, juice, or soup when you can drink without vomiting. ? Make sure you have little or no nausea before eating solid foods. General instructions  Have a responsible adult stay with you until you are awake and alert.  Take over-the-counter and prescription medicines only as told by your health care provider.  If you smoke, do not smoke without supervision.  Keep all follow-up visits as told by your health care provider. This is important. Contact a health care provider if:  You keep feeling nauseous or you keep vomiting.  You feel light-headed.  You develop a rash.  You have a fever. Get help right away if:  You have trouble breathing. This information is not intended to replace advice given to you by your health care provider. Make sure you discuss any questions you have  with your health care provider. Document Released: 11/02/2012 Document Revised: 06/17/2015 Document Reviewed: 05/04/2015 Elsevier Interactive Patient Education  2018 [place] Insertion, Care After This sheet gives you information about how to care for yourself after your procedure. Your health care provider may also give you more specific instructions. If you have problems or questions, contact your health care provider. What can I expect after the procedure? After your procedure, it is common to have:  Discomfort at the port insertion site.  Bruising on the skin over the port. This should improve over 3-4 days.  Follow these instructions at home: [facility] care  After your port is placed, you will get a manufacturer's information card. The card has information about your port. Keep this card with you at all times.  Take care of the port as told by your health care provider. Ask your health care provider if you or a family member can get training for taking care of the port at home. A home health care nurse may also take care of the port.  Make sure to remember what type of port you have. Incision care  Follow instructions from your health care provider about how to take care of your port insertion site. Make sure you: ? Wash your hands with soap and water before you change your bandage (dressing). If soap and water are not available, use hand sanitizer. ? Change your dressing as told by your health care provider.  You may remove your dressing tomorrow. ? Leave skin glue, or adhesive strips in place.  These skin closures may need to stay in place for 2 weeks or longer. If adhesive strip edges start to loosen and curl up, you may trim the loose edges. Do not remove adhesive strips completely unless your health care provider tells you to do that.  DO not use EMLA cream for 2 weeks after port placement as this cream will remove your surgical glue.  Check your port insertion  site every day for signs of infection. Check for: ? More redness, swelling, or pain. ? More fluid or blood. ? Warmth. ? Pus or a bad smell. General instructions  Do not take baths, swim, or use a hot tub until your health care provider approves.  You may shower tomorrow.  Do not lift anything that is heavier than 10 lb (4.5 kg) for a week, or as told by your health care provider.  Ask your health care provider when it is okay to: ? Return to work or school. ? Resume usual physical activities or sports.  Do not drive for 24 hours if you were given a medicine to help you relax (sedative).  Take over-the-counter and prescription medicines only as told by your health care provider.  Wear a medical alert bracelet in case of an emergency. This will tell any health care providers that you have a port.  Keep all follow-up visits as told by your health care provider. This is important. Contact a health care provider if:  You have a fever or chills.  You have more redness, swelling, or pain around your port insertion site.  You have more fluid or blood coming from your port insertion site.  Your port insertion site feels warm to the touch.  You have pus or a bad smell coming from the port insertion site. Get help right away if:  You have chest pain or shortness of breath.  You have bleeding from your port that you cannot control. Summary  Take care of the port as told by your health care provider.  Change your dressing as told by your health care provider.  Keep all follow-up visits as told by your health care provider. This information is not intended to replace advice given to you by your health care provider. Make sure you discuss any questions you have with your health care provider. Document Released: 11/02/2012 Document Revised: 12/04/2015 Document Reviewed: 12/04/2015 Elsevier Interactive Patient Education  2017 [employer].

## 2017-07-08 ENCOUNTER — Encounter: Payer: Self-pay | Admitting: Family Medicine

## 2017-07-08 ENCOUNTER — Other Ambulatory Visit: Payer: Self-pay | Admitting: Family Medicine

## 2017-07-08 NOTE — Telephone Encounter (Signed)
Copied from [place] [phone]. Topic: Quick Communication - See Telephone Encounter >> Jul 08, 2017  5:12 PM Ivar Drape wrote: CRM for notification. See Telephone encounter for: 07/08/17. Patient would like a refill on her traMADol (ULTRAM) 50 MG tablet medication and have it sent to her preferred pharmacy [facility] on [address]

## 2017-07-09 ENCOUNTER — Inpatient Hospital Stay: Payer: Medicare Other

## 2017-07-09 ENCOUNTER — Other Ambulatory Visit: Payer: Self-pay | Admitting: Hematology and Oncology

## 2017-07-09 VITALS — BP 138/71 | HR 61 | Temp 98.0°F | Resp 17

## 2017-07-09 DIAGNOSIS — C541 Malignant neoplasm of endometrium: Secondary | ICD-10-CM

## 2017-07-09 MED ORDER — PALONOSETRON HCL INJECTION 0.25 MG/5ML
0.2500 mg | Freq: Once | INTRAVENOUS | Status: AC
Start: 2017-07-09 — End: 2017-07-09
  Administered 2017-07-09: 0.25 mg via INTRAVENOUS

## 2017-07-09 MED ORDER — FAMOTIDINE IN NACL 20-0.9 MG/50ML-% IV SOLN
20.0000 mg | Freq: Once | INTRAVENOUS | Status: AC
  Administered 2017-07-09: 20 mg via INTRAVENOUS

## 2017-07-09 MED ORDER — PALONOSETRON HCL INJECTION 0.25 MG/5ML
INTRAVENOUS | Status: AC
  Filled 2017-07-09: qty 5

## 2017-07-09 MED ORDER — SODIUM CHLORIDE 0.9 % IV SOLN
131.2500 mg/m2 | Freq: Once | INTRAVENOUS | Status: AC
  Administered 2017-07-09: 288 mg via INTRAVENOUS
  Filled 2017-07-09: qty 48

## 2017-07-09 MED ORDER — DIPHENHYDRAMINE HCL 50 MG/ML IJ SOLN
INTRAMUSCULAR | Status: AC
  Filled 2017-07-09: qty 1

## 2017-07-09 MED ORDER — SODIUM CHLORIDE 0.9 % IV SOLN
Freq: Once | INTRAVENOUS | Status: AC
  Administered 2017-07-09: 11:00:00 via INTRAVENOUS

## 2017-07-09 MED ORDER — HEPARIN SOD (PORK) LOCK FLUSH 100 UNIT/ML IV SOLN
500.0000 [IU] | Freq: Once | INTRAVENOUS | Status: AC | PRN
  Administered 2017-07-09: 500 [IU]
  Filled 2017-07-09: qty 5

## 2017-07-09 MED ORDER — SODIUM CHLORIDE 0.9 % IV SOLN
500.0000 mg | Freq: Once | INTRAVENOUS | Status: AC
  Administered 2017-07-09: 500 mg via INTRAVENOUS
  Filled 2017-07-09: qty 50

## 2017-07-09 MED ORDER — FAMOTIDINE IN NACL 20-0.9 MG/50ML-% IV SOLN
INTRAVENOUS | Status: AC
  Filled 2017-07-09: qty 50

## 2017-07-09 MED ORDER — SODIUM CHLORIDE 0.9 % IV SOLN
Freq: Once | INTRAVENOUS | Status: AC
  Administered 2017-07-09: 11:00:00 via INTRAVENOUS
  Filled 2017-07-09: qty 5

## 2017-07-09 MED ORDER — DIPHENHYDRAMINE HCL 50 MG/ML IJ SOLN
50.0000 mg | Freq: Once | INTRAMUSCULAR | Status: AC
  Administered 2017-07-09: 50 mg via INTRAVENOUS

## 2017-07-09 MED ORDER — SODIUM CHLORIDE 0.9% FLUSH
10.0000 mL | INTRAVENOUS | Status: DC | PRN
  Administered 2017-07-09: 10 mL
  Filled 2017-07-09: qty 10

## 2017-07-09 NOTE — Progress Notes (Signed)
07/09/17 @ 1100  Confirmed with Dr Alvy Bimler dose of Carboplatin to be modified to 500 mg based on current scr of 1.37.  Dose modified per instructions.  T.O. Dr Mat Carne RN/Quanna Wittke Ronnald Ramp, PharmD

## 2017-07-09 NOTE — Patient Instructions (Addendum)
[facility] Discharge Instructions for Patients Receiving Chemotherapy  Today you received the following chemotherapy agents: Taxol, Carbo    To help prevent nausea and vomiting after your treatment, we encourage you to take your nausea medication as prescribed   If you develop nausea and vomiting that is not controlled by your nausea medication, call the clinic.   BELOW ARE SYMPTOMS THAT SHOULD BE REPORTED IMMEDIATELY:  *FEVER GREATER THAN 100.5 F  *CHILLS WITH OR WITHOUT FEVER  NAUSEA AND VOMITING THAT IS NOT CONTROLLED WITH YOUR NAUSEA MEDICATION  *UNUSUAL SHORTNESS OF BREATH  *UNUSUAL BRUISING OR BLEEDING  TENDERNESS IN MOUTH AND THROAT WITH OR WITHOUT PRESENCE OF ULCERS  *URINARY PROBLEMS  *BOWEL PROBLEMS  UNUSUAL RASH Items with * indicate a potential emergency and should be followed up as soon as possible.  Feel free to call the clinic should you have any questions or concerns. The clinic phone number is [phone].  Please show the CHEMO ALERT CARD at check-in to the Emergency Department and triage nurse.  Paclitaxel injection What is this medicine? PACLITAXEL (PAK li TAX el) is a chemotherapy drug. It targets fast dividing cells, like cancer cells, and causes these cells to die. This medicine is used to treat ovarian cancer, breast cancer, and other cancers. This medicine may be used for other purposes; ask your health care provider or pharmacist if you have questions. COMMON BRAND NAME(S): Onxol, Taxol What should I tell my health care provider before I take this medicine? They need to know if you have any of these conditions: -blood disorders -irregular heartbeat -infection (especially a virus infection such as chickenpox, cold sores, or herpes) -liver disease -previous or ongoing radiation therapy -an unusual or allergic reaction to paclitaxel, alcohol, polyoxyethylated castor oil, other chemotherapy agents, other medicines, foods, dyes, or  preservatives -pregnant or trying to get pregnant -breast-feeding How should I use this medicine? This drug is given as an infusion into a vein. It is administered in a hospital or clinic by a specially trained health care professional. Talk to your pediatrician regarding the use of this medicine in children. Special care may be needed. Overdosage: If you think you have taken too much of this medicine contact a poison control center or emergency room at once. NOTE: This medicine is only for you. Do not share this medicine with others. What if I miss a dose? It is important not to miss your dose. Call your doctor or health care professional if you are unable to keep an appointment. What may interact with this medicine? Do not take this medicine with any of the following medications: -disulfiram -metronidazole This medicine may also interact with the following medications: -cyclosporine -diazepam -ketoconazole -medicines to increase blood counts like filgrastim, pegfilgrastim, sargramostim -other chemotherapy drugs like cisplatin, doxorubicin, epirubicin, etoposide, teniposide, vincristine -quinidine -testosterone -vaccines -verapamil Talk to your doctor or health care professional before taking any of these medicines: -acetaminophen -aspirin -ibuprofen -ketoprofen -naproxen This list may not describe all possible interactions. Give your health care provider a list of all the medicines, herbs, non-prescription drugs, or dietary supplements you use. Also tell them if you smoke, drink alcohol, or use illegal drugs. Some items may interact with your medicine. What should I watch for while using this medicine? Your condition will be monitored carefully while you are receiving this medicine. You will need important blood work done while you are taking this medicine. This medicine can cause serious allergic reactions. To reduce your risk you will   need to take other medicine(s) before  treatment with this medicine. If you experience allergic reactions like skin rash, itching or hives, swelling of the face, lips, or tongue, tell your doctor or health care professional right away. In some cases, you may be given additional medicines to help with side effects. Follow all directions for their use. This drug may make you feel generally unwell. This is not uncommon, as chemotherapy can affect healthy cells as well as cancer cells. Report any side effects. Continue your course of treatment even though you feel ill unless your doctor tells you to stop. Call your doctor or health care professional for advice if you get a fever, chills or sore throat, or other symptoms of a cold or flu. Do not treat yourself. This drug decreases your body's ability to fight infections. Try to avoid being around people who are sick. This medicine may increase your risk to bruise or bleed. Call your doctor or health care professional if you notice any unusual bleeding. Be careful brushing and flossing your teeth or using a toothpick because you may get an infection or bleed more easily. If you have any dental work done, tell your dentist you are receiving this medicine. Avoid taking products that contain aspirin, acetaminophen, ibuprofen, naproxen, or ketoprofen unless instructed by your doctor. These medicines may hide a fever. Do not become pregnant while taking this medicine. Women should inform their doctor if they wish to become pregnant or think they might be pregnant. There is a potential for serious side effects to an unborn child. Talk to your health care professional or pharmacist for more information. Do not breast-feed an infant while taking this medicine. Men are advised not to father a child while receiving this medicine. This product may contain alcohol. Ask your pharmacist or healthcare provider if this medicine contains alcohol. Be sure to tell all healthcare providers you are taking this medicine.  Certain medicines, like metronidazole and disulfiram, can cause an unpleasant reaction when taken with alcohol. The reaction includes flushing, headache, nausea, vomiting, sweating, and increased thirst. The reaction can last from 30 minutes to several hours. What side effects may I notice from receiving this medicine? Side effects that you should report to your doctor or health care professional as soon as possible: -allergic reactions like skin rash, itching or hives, swelling of the face, lips, or tongue -low blood counts - This drug may decrease the number of white blood cells, red blood cells and platelets. You may be at increased risk for infections and bleeding. -signs of infection - fever or chills, cough, sore throat, pain or difficulty passing urine -signs of decreased platelets or bleeding - bruising, pinpoint red spots on the skin, black, tarry stools, nosebleeds -signs of decreased red blood cells - unusually weak or tired, fainting spells, lightheadedness -breathing problems -chest pain -high or low blood pressure -mouth sores -nausea and vomiting -pain, swelling, redness or irritation at the injection site -pain, tingling, numbness in the hands or feet -slow or irregular heartbeat -swelling of the ankle, feet, hands Side effects that usually do not require medical attention (report to your doctor or health care professional if they continue or are bothersome): -bone pain -complete hair loss including hair on your head, underarms, pubic hair, eyebrows, and eyelashes -changes in the color of fingernails -diarrhea -loosening of the fingernails -loss of appetite -muscle or joint pain -red flush to skin -sweating This list may not describe all possible side effects. Call your doctor for medical   advice about side effects. You may report side effects to FDA at 1-800-FDA-1088. Where should I keep my medicine? This drug is given in a hospital or clinic and will not be stored at  home. NOTE: This sheet is a summary. It may not cover all possible information. If you have questions about this medicine, talk to your doctor, pharmacist, or health care provider.  2018 Elsevier/Gold Standard (2014-11-13 19:58:00) Carboplatin injection What is this medicine? CARBOPLATIN (KAR boe pla tin) is a chemotherapy drug. It targets fast dividing cells, like cancer cells, and causes these cells to die. This medicine is used to treat ovarian cancer and many other cancers. This medicine may be used for other purposes; ask your health care provider or pharmacist if you have questions. COMMON BRAND NAME(S): Paraplatin What should I tell my health care provider before I take this medicine? They need to know if you have any of these conditions: -blood disorders -hearing problems -kidney disease -recent or ongoing radiation therapy -an unusual or allergic reaction to carboplatin, cisplatin, other chemotherapy, other medicines, foods, dyes, or preservatives -pregnant or trying to get pregnant -breast-feeding How should I use this medicine? This drug is usually given as an infusion into a vein. It is administered in a hospital or clinic by a specially trained health care professional. Talk to your pediatrician regarding the use of this medicine in children. Special care may be needed. Overdosage: If you think you have taken too much of this medicine contact a poison control center or emergency room at once. NOTE: This medicine is only for you. Do not share this medicine with others. What if I miss a dose? It is important not to miss a dose. Call your doctor or health care professional if you are unable to keep an appointment. What may interact with this medicine? -medicines for seizures -medicines to increase blood counts like filgrastim, pegfilgrastim, sargramostim -some antibiotics like amikacin, gentamicin, neomycin, streptomycin, tobramycin -vaccines Talk to your doctor or health care  professional before taking any of these medicines: -acetaminophen -aspirin -ibuprofen -ketoprofen -naproxen This list may not describe all possible interactions. Give your health care provider a list of all the medicines, herbs, non-prescription drugs, or dietary supplements you use. Also tell them if you smoke, drink alcohol, or use illegal drugs. Some items may interact with your medicine. What should I watch for while using this medicine? Your condition will be monitored carefully while you are receiving this medicine. You will need important blood work done while you are taking this medicine. This drug may make you feel generally unwell. This is not uncommon, as chemotherapy can affect healthy cells as well as cancer cells. Report any side effects. Continue your course of treatment even though you feel ill unless your doctor tells you to stop. In some cases, you may be given additional medicines to help with side effects. Follow all directions for their use. Call your doctor or health care professional for advice if you get a fever, chills or sore throat, or other symptoms of a cold or flu. Do not treat yourself. This drug decreases your body's ability to fight infections. Try to avoid being around people who are sick. This medicine may increase your risk to bruise or bleed. Call your doctor or health care professional if you notice any unusual bleeding. Be careful brushing and flossing your teeth or using a toothpick because you may get an infection or bleed more easily. If you have any dental work done, tell your dentist   you are receiving this medicine. Avoid taking products that contain aspirin, acetaminophen, ibuprofen, naproxen, or ketoprofen unless instructed by your doctor. These medicines may hide a fever. Do not become pregnant while taking this medicine. Women should inform their doctor if they wish to become pregnant or think they might be pregnant. There is a potential for serious side  effects to an unborn child. Talk to your health care professional or pharmacist for more information. Do not breast-feed an infant while taking this medicine. What side effects may I notice from receiving this medicine? Side effects that you should report to your doctor or health care professional as soon as possible: -allergic reactions like skin rash, itching or hives, swelling of the face, lips, or tongue -signs of infection - fever or chills, cough, sore throat, pain or difficulty passing urine -signs of decreased platelets or bleeding - bruising, pinpoint red spots on the skin, black, tarry stools, nosebleeds -signs of decreased red blood cells - unusually weak or tired, fainting spells, lightheadedness -breathing problems -changes in hearing -changes in vision -chest pain -high blood pressure -low blood counts - This drug may decrease the number of white blood cells, red blood cells and platelets. You may be at increased risk for infections and bleeding. -nausea and vomiting -pain, swelling, redness or irritation at the injection site -pain, tingling, numbness in the hands or feet -problems with balance, talking, walking -trouble passing urine or change in the amount of urine Side effects that usually do not require medical attention (report to your doctor or health care professional if they continue or are bothersome): -hair loss -loss of appetite -metallic taste in the mouth or changes in taste This list may not describe all possible side effects. Call your doctor for medical advice about side effects. You may report side effects to FDA at 1-800-FDA-1088. Where should I keep my medicine? This drug is given in a hospital or clinic and will not be stored at home. NOTE: This sheet is a summary. It may not cover all possible information. If you have questions about this medicine, talk to your doctor, pharmacist, or health care provider.  2018 Elsevier/Gold Standard (2007-04-19  14:38:05)  

## 2017-07-09 NOTE — Telephone Encounter (Signed)
Rx refill request: tramadol 50 mg        Last filled: 04/05/17 #360  LOV: 10/29/16  PCP: [place]: verified

## 2017-07-10 ENCOUNTER — Other Ambulatory Visit: Payer: Self-pay | Admitting: Family Medicine

## 2017-07-12 ENCOUNTER — Ambulatory Visit (HOSPITAL_COMMUNITY)
Admission: RE | Admit: 2017-07-12 | Discharge: 2017-07-12 | Disposition: A | Discharge status: Home / Self Care | Payer: Medicare Other | Source: Ambulatory Visit | Attending: Medical | Admitting: Medical

## 2017-07-12 ENCOUNTER — Telehealth: Payer: Self-pay | Admitting: *Deleted

## 2017-07-12 ENCOUNTER — Telehealth: Payer: Self-pay | Admitting: Family Medicine

## 2017-07-12 ENCOUNTER — Encounter: Payer: Self-pay | Admitting: Oncology

## 2017-07-12 ENCOUNTER — Inpatient Hospital Stay (HOSPITAL_BASED_OUTPATIENT_CLINIC_OR_DEPARTMENT_OTHER): Payer: Medicare Other | Admitting: Medical

## 2017-07-12 ENCOUNTER — Inpatient Hospital Stay: Payer: Medicare Other

## 2017-07-12 ENCOUNTER — Other Ambulatory Visit: Payer: Self-pay | Admitting: *Deleted

## 2017-07-12 VITALS — BP 92/63 | HR 90 | Temp 98.7°F | Resp 18 | Ht 65.0 in | Wt 200.8 lb

## 2017-07-12 DIAGNOSIS — K5900 Constipation, unspecified: Secondary | ICD-10-CM | POA: Insufficient documentation

## 2017-07-12 DIAGNOSIS — M2550 Pain in unspecified joint: Secondary | ICD-10-CM

## 2017-07-12 DIAGNOSIS — I10 Essential (primary) hypertension: Secondary | ICD-10-CM | POA: Diagnosis not present

## 2017-07-12 DIAGNOSIS — C541 Malignant neoplasm of endometrium: Secondary | ICD-10-CM | POA: Diagnosis not present

## 2017-07-12 DIAGNOSIS — E119 Type 2 diabetes mellitus without complications: Secondary | ICD-10-CM

## 2017-07-12 DIAGNOSIS — M791 Myalgia, unspecified site: Secondary | ICD-10-CM

## 2017-07-12 DIAGNOSIS — M792 Neuralgia and neuritis, unspecified: Secondary | ICD-10-CM

## 2017-07-12 DIAGNOSIS — M79605 Pain in left leg: Secondary | ICD-10-CM

## 2017-07-12 LAB — CMP (CANCER CENTER ONLY)
ALT: 13 U/L (ref 0–55)
AST: 19 U/L (ref 5–34)
Albumin: 3.2 g/dL — ABNORMAL LOW (ref 3.5–5.0)
Alkaline Phosphatase: 124 U/L (ref 40–150)
Anion gap: 9 (ref 3–11)
BUN: 22 mg/dL (ref 7–26)
CHLORIDE: 96 mmol/L — AB (ref 98–109)
CO2: 30 mmol/L — AB (ref 22–29)
CREATININE: 1.32 mg/dL — AB (ref 0.60–1.10)
Calcium: 9.7 mg/dL (ref 8.4–10.4)
GFR, EST AFRICAN AMERICAN: 47 mL/min — AB (ref >60)
GFR, Estimated: 40 mL/min — ABNORMAL LOW (ref >60)
Glucose, Bld: 277 mg/dL — ABNORMAL HIGH (ref 70–140)
Potassium: 4.5 mmol/L (ref 3.5–5.1)
SODIUM: 135 mmol/L — AB (ref 136–145)
Total Bilirubin: 1 mg/dL (ref 0.2–1.2)
Total Protein: 7.2 g/dL (ref 6.4–8.3)

## 2017-07-12 LAB — CBC WITH DIFFERENTIAL (CANCER CENTER ONLY)
Basophils Absolute: 0 10*3/uL (ref 0.0–0.1)
Basophils Relative: 0 %
EOS ABS: 0.1 10*3/uL (ref 0.0–0.5)
Eosinophils Relative: 2 %
HEMATOCRIT: 41.1 % (ref 34.8–46.6)
HEMOGLOBIN: 13.4 g/dL (ref 11.6–15.9)
LYMPHS ABS: 1.4 10*3/uL (ref 0.9–3.3)
Lymphocytes Relative: 20 %
MCH: 27.4 pg (ref 25.1–34.0)
MCHC: 32.6 g/dL (ref 31.5–36.0)
MCV: 84 fL (ref 79.5–101.0)
MONOS PCT: 1 %
Monocytes Absolute: 0.1 10*3/uL (ref 0.1–0.9)
NEUTROS PCT: 77 %
Neutro Abs: 5.6 10*3/uL (ref 1.5–6.5)
Platelet Count: 239 10*3/uL (ref 145–400)
RBC: 4.89 MIL/uL (ref 3.70–5.45)
RDW: 14.2 % (ref 11.2–14.5)
WBC Count: 7.3 10*3/uL (ref 3.9–10.3)

## 2017-07-12 MED ORDER — SENNA 8.6 MG PO TABS: 1.0000 | ORAL_TABLET | Freq: Every day | ORAL | 0 refills | Status: DC

## 2017-07-12 MED ORDER — TRAMADOL HCL 50 MG PO TABS: ORAL_TABLET | ORAL | 0 refills | Status: DC

## 2017-07-12 NOTE — Telephone Encounter (Signed)
Last OV 10/29/2016   Last refilled 05/06/2017  Disp 360 with 1 refill   Sent to PCP for approval   Pt take 2 tablets by mouth every 4 hours as needed

## 2017-07-12 NOTE — Telephone Encounter (Signed)
Called patient for chemo follow up. States she is "doing horrible, hurt all over, can hardly walk".  Started on Saturday. "I've never been in this much pain in my life" . Did not go to Urgent Care this weekend because she did not want to get sick.  Is drinking water, cannot eat anything. States she was eating prior to chemo. Is feeling nauseated, no vomiting. Has taken zofran this morning at 0800 instructed to take a compazine now and see if she can eat lunch.   States she ran out of tramadol on Friday- has called PCP for refills- no response.   Has not had BM since Thursday. Needs refill of senokt.   Will come at 1:00pm for labs and see Sandi Mealy, PA.

## 2017-07-12 NOTE — Telephone Encounter (Signed)
-----   Message from Mathis Fare, RN sent at 07/09/2017  5:48 PM EDT ----- Regarding: dr. Alvy Bimler chemo follow up  First carbo/taxol. Tolerated infusion well

## 2017-07-12 NOTE — Telephone Encounter (Signed)
OK to refill sennokot

## 2017-07-12 NOTE — Telephone Encounter (Signed)
Copied from [place] [phone]. Topic: Quick Communication - Rx Refill/Question >> Jul 12, 2017  4:38 PM Neva Seat wrote: Pt wanting a nurse to call her back ASAP  regarding the refill on her traMADol (ULTRAM) 50 MG tablet.

## 2017-07-12 NOTE — Progress Notes (Signed)
These results were called to [facility] and were reviewed with her . Her were answered. She expressed understanding. Told to take 1.2 bottle of magnesium citrate and take remainder if she has had no bowel movement within 1 hour. Sandi Mealy

## 2017-07-12 NOTE — Telephone Encounter (Signed)
Sent to PCP for approval.  

## 2017-07-13 ENCOUNTER — Ambulatory Visit: Payer: Medicare Other | Admitting: Neurology

## 2017-07-13 ENCOUNTER — Telehealth: Payer: Self-pay | Admitting: *Deleted

## 2017-07-13 NOTE — Telephone Encounter (Signed)
Called patient to follow up. States she does "not feel well, but is trying". Has not gotten Mag Citrate because the drugstore told her son that they do not carry it. Has used tramadol with some relief. She is going to have son pick up Mag citrate tonight.

## 2017-07-13 NOTE — Telephone Encounter (Signed)
Prescription phoned in to the pharmacy.

## 2017-07-13 NOTE — Telephone Encounter (Signed)
Left message for patient to return call to office. 

## 2017-07-13 NOTE — Progress Notes (Signed)
[facility] Progress Note   KAMAREE BERKEL [id number] 1950-06-19 67 y.o.  Katie Palmer is managed by Dr. Heath Lark  Actively treated with chemotherapy/immunotherapy: yes  Current Therapy: Carboplatin and paclitaxel  Last Treated: 07/09/2017 (cycle 1, day 1)  Assessment: Plan:    Constipation, unspecified constipation type - Plan: DG Abd 1 View  Myalgia  Arthralgia, unspecified joint   Constipation: The patient was referred for a KUB which returned showing: Moderate stool transverse colon and descending colon.  The patient was instructed to take one half bottle of magnesium citrate with instructions to repeat if she is not had a bowel movement within 30 to 60 minutes.  She was also instructed that she could use Colace 100 mg, 1 to 2 capsules p.o. twice daily and could use MiraLAX 17 g in 8 ounces of liquid 1-2 times daily.  Myalgias and arthralgias: The patient was instructed to contact her primary care provider, Dr. Alysia Penna for a refill of her tramadol.  According to the patient she takes 2 tramadol 50 mg tablets every 4 hours for her history of fibromyalgia.  When I contacted the patient regarding her KUB results she stated that she had not had a refill of her tramadol sent to her pharmacy by Dr. [name] as yet.  I agree that I would give her enough tablets to last her for the next 2 days until she could get a prescription refill from Dr. [name].  Dr. Ernst Spell Gorsuch's had stated to me in her phone call from earlier today the Dr. Heath Lark had instructed that no pain medicines higher than tramadol be prescribed for the patient.  She had stated that it was okay to refill tramadol if needed.  Please see After Visit Summary for patient specific instructions.  Future Appointments  Date Time Provider [place]  07/30/2017  9:15 AM [facility] LAB 4 [facility] None  07/30/2017  9:30 AM [facility] FLUSH NURSE 2 [facility] None  07/30/2017 10:00 AM Gorsuch, Ni, MD  [facility] None  07/30/2017 11:00 AM [facility] B7 [facility] None    Orders Placed This Encounter  Procedures  . DG Abd 1 View       Subjective:   Patient ID:  Katie Palmer is a 67 y.o. (DOB [birth year]) female.  Chief Complaint: No chief complaint on file.   HPI [name] is a 67 year old female with endometrial cancer who is managed by Dr. Heath Lark.  The patient is status post cycle 1, day 1 of carboplatin and paclitaxel which was dosed on 07/09/2017.  The patient presents to the office today with a report of significant arthralgias and myalgias.  The patient has a history of fibromyalgia but states that "she has never had this level of pain before."  She takes tramadol for her fibromyalgia pain.  These have been provided by her primary care provider Dr. Alysia Penna.  She ran out last week.  She states that she is taking 2, 50 mg tablets every 4 hours.  She contacted 1 of Dr. Barbie Banner partners this weekend and was given 10 tramadol.  She has a call in to Dr. Barbie Banner office regarding her refill.  I contacted Dr. Barbie Banner nurse who states that they have received a refill request from the patient's pharmacy which Dr. [name] has yet to review.  Patient also reports that she has had 5 days of constipation and has had anorexia.  She denies any nausea or vomiting.  Medications: I have reviewed the  patient's current medications.  Allergies:  Allergies  Allergen Reactions  . Armodafinil Other (See Comments)    Flu-like symptoms  . Invokana [Canagliflozin] Other (See Comments)    "shut down pancreas"   . Lasix [Furosemide] Diarrhea    Severe diarrhea  . Adhesive [Tape] Itching and Rash    Pt has not tried paper tape    Past Medical History:  Diagnosis Date  . Depression   . Fibromyalgia   . Headache(784.0)   . History of colon polyps   . History of diabetes with ketoacidosis   . Hx of colonic polyps   . Hypertension   . Insomnia   . Insulin pump in place   . RBBB (right bundle  branch block)   . RLQ abdominal pain   . Thickened endometrium   . Type 1 diabetes mellitus with long-term current use of insulin [facility]) endocrinologist-- dr Chalmers Cater   July 2015--  pt took Invokana and caused complete pancreas shut-down , pt went from pre-diabetic to adult type 1 dm now insulin dependent  . Vertigo     Past Surgical History:  Procedure Laterality Date  . CARDIOVASCULAR STRESS TEST  07/26/2015   normal myocardial perfusion study w/ no ischemia/  normal LV function and wall motion , ef 71%  . COLONOSCOPY WITH PROPOFOL  last one 10/ 2017  . HYSTEROSCOPY W/D&C N/A 12/25/2015   Procedure: DILATATION AND CURETTAGE /HYSTEROSCOPY;  Surgeon: Bobbye Charleston, MD;  Location: [facility];  Service: Gynecology;  Laterality: N/A;  . IR IMAGING GUIDED PORT INSERTION  07/07/2017  . IR RADIOLOGIST EVAL & MGMT  05/06/2017  . IR RADIOLOGIST EVAL & MGMT  05/18/2017  . IR RADIOLOGIST EVAL & MGMT  05/13/2017  . IR RADIOLOGIST EVAL & MGMT  06/03/2017  . IR RADIOLOGIST EVAL & MGMT  06/09/2017  . IR RADIOLOGIST EVAL & MGMT  06/24/2017  . LAPAROSCOPIC LYSIS OF ADHESIONS N/A 12/25/2015   Procedure: LAPAROSCOPIC LYSIS OF ADHESIONS;  Surgeon: Bobbye Charleston, MD;  Location: [place];  Service: Gynecology;  Laterality: N/A;  . LAPAROSCOPIC UNILATERAL SALPINGO OOPHERECTOMY Right 12/25/2015   Procedure: LAPAROSCOPIC UNILATERAL SALPINGO OOPHORECTOMY;  Surgeon: Bobbye Charleston, MD;  Location: [place];  Service: Gynecology;  Laterality: Right;  . LAPAROSCOPY N/A 12/25/2015   Procedure: LAPAROSCOPY OPERATIVE;  Surgeon: Bobbye Charleston, MD;  Location: [facility];  Service: Gynecology;  Laterality: N/A;  . LYMPH NODE BIOPSY N/A 04/15/2017   Procedure: SENTINEL LYMPH NODE BIOPSY, PERIAORTIC LYMPH NODES, AND VAGINAL BIOPSY;  Surgeon: Everitt Amber, MD;  Location: [facility];  Service: Gynecology;  Laterality: N/A;  . ROBOTIC ASSISTED TOTAL HYSTERECTOMY  WITH BILATERAL SALPINGO OOPHERECTOMY Bilateral 04/15/2017   Procedure: XI ROBOTIC ASSISTED TOTAL LAPROSCOPIC  HYSTERECTOMY WITH LEFT SALPINGO OOPHORECTOMY;  Surgeon: Everitt Amber, MD;  Location: [facility];  Service: Gynecology;  Laterality: Bilateral;  . TONSILLECTOMY  age 34  . TRANSTHORACIC ECHOCARDIOGRAM  07/26/2015   grade 1 diastolic dysfunction , ef 60-65%/  trivial TR    Family History  Problem Relation Age of Onset  . Alcohol abuse Father        Died age 27  . Heart disease Father   . Aneurysm Mother        Died age 37  . Leukemia Brother        Died age 29  . COPD Sister   . Rheum arthritis Sister   . Heart failure Sister   . Dementia Maternal Uncle   .  Colon cancer Neg Hx     Social History   Socioeconomic History  . Marital status: Divorced    Spouse name: Not on file  . Number of children: 1  . Years of education: Not on file  . Highest education level: Not on file  Occupational History  . 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 Financial resource strain: Not on file  . Food insecurity:    Worry: Not on file    Inability: Not on file  . Transportation needs:    Medical: Not on file    Non-medical: Not on file  Tobacco Use  . Smoking status: Never Smoker  . Smokeless tobacco: Never Used  Substance and Sexual Activity  . Alcohol use: No    Alcohol/week: 0.0 oz  . Drug use: No  . Sexual activity: Not Currently    Birth control/protection: Post-menopausal  Lifestyle  . Physical activity:    Days per week: Not on file    Minutes per session: Not on file  . Stress: Not on file  Relationships  . Social connections:    Talks on phone: Not on file    Gets together: Not on file    Attends religious service: Not on file    Active member of club or organization: Not on file    Attends meetings of clubs or organizations: Not on file    Relationship status: Not on file  . Intimate partner violence:    Fear of current or ex partner: Not on file     Emotionally abused: Not on file    Physically abused: Not on file    Forced sexual activity: Not on file  Other Topics Concern  . Not on file  Social History Narrative   Divorced.  Son lives with her.  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    Past Medical History, Surgical history, Social history, and Family history were reviewed and updated as appropriate.   Please see review of systems for further details on the patient's review from today.   Review of Systems:  Review of Systems  Constitutional: Positive for appetite change. Negative for chills, diaphoresis, fever and unexpected weight change.  Gastrointestinal: Positive for constipation. Negative for abdominal distention, abdominal pain, anal bleeding, blood in stool, diarrhea, nausea, rectal pain and vomiting.  Musculoskeletal: Positive for arthralgias and myalgias.    Objective:   Physical Exam:  BP 92/63 (BP Location: Left Arm, Patient Position: Sitting)   Pulse 90   Temp 98.7 F (37.1 C) (Oral)   Resp 18   Ht 5\' 5"  (1.651 m)   Wt 200 lb 12.8 oz (91.1 kg)   SpO2 98%   BMI 33.41 kg/m  ECOG: 0  Physical Exam  Constitutional: No distress.  The patient is an adult female who is animated and was rocking back and forth and rubbing her legs when I initially entered the room.  HENT:  Head: Normocephalic and atraumatic.  Cardiovascular: Normal rate, regular rhythm and normal heart sounds. Exam reveals no gallop and no friction rub.  No murmur heard. Pulmonary/Chest: Effort normal and breath sounds normal. No respiratory distress. She has no wheezes. She has no rales.  Abdominal: Soft. She exhibits no distension and no mass. Bowel sounds are decreased. There is no tenderness. There is no rebound and no guarding.  Musculoskeletal: She exhibits no edema.  Neurological: She is alert.  Skin: Skin is warm and dry. She is not diaphoretic.  Lab Review:     Component Value Date/Time   NA 135 (L)  07/12/2017 1232   K 4.5 07/12/2017 1232   CL 96 (L) 07/12/2017 1232   CO2 30 (H) 07/12/2017 1232   GLUCOSE 277 (H) 07/12/2017 1232   BUN 22 07/12/2017 1232   CREATININE 1.32 (H) 07/12/2017 1232   CALCIUM 9.7 07/12/2017 1232   PROT 7.2 07/12/2017 1232   ALBUMIN 3.2 (L) 07/12/2017 1232   AST 19 07/12/2017 1232   ALT 13 07/12/2017 1232   ALKPHOS 124 07/12/2017 1232   BILITOT 1.0 07/12/2017 1232   GFRNONAA 40 (L) 07/12/2017 1232   GFRAA 47 (L) 07/12/2017 1232       Component Value Date/Time   WBC 7.3 07/12/2017 1232   WBC 9.3 07/07/2017 1248   RBC 4.89 07/12/2017 1232   HGB 13.4 07/12/2017 1232   HCT 41.1 07/12/2017 1232   PLT 239 07/12/2017 1232   MCV 84.0 07/12/2017 1232   MCH 27.4 07/12/2017 1232   MCHC 32.6 07/12/2017 1232   RDW 14.2 07/12/2017 1232   LYMPHSABS 1.4 07/12/2017 1232   MONOABS 0.1 07/12/2017 1232   EOSABS 0.1 07/12/2017 1232   BASOSABS 0.0 07/12/2017 1232   -------------------------------  Imaging from last 24 hours (if applicable):  Radiology interpretation: Dg Abd 1 View  Result Date: 07/12/2017 CLINICAL DATA:  67 year old female with constipation. Abdominal pain since beginning chemotherapy (endometrial cancer) 1 week ago. Initial encounter. EXAM: ABDOMEN - 1 VIEW COMPARISON:  06/24/2017 CT. FINDINGS: Moderate stool transverse colon and descending colon. Gas within non dilated rectosigmoid colon. No evidence of gas distended small bowel loops. The possibility of free intraperitoneal air cannot be assessed on a supine view. IMPRESSION: Moderate stool transverse colon and descending colon. Electronically Signed   By: Genia Del M.D.   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 Status post postoperative retroperitoneal lymphocele drainage with placement of right and left retroperitoneal drains on 04/30/2017 and  additional left retroperitoneal drainage catheter placement on 05/21/2017. Currently with single remaining left-sided retroperitoneal drainage catheter. EXAM: CT ABDOMEN AND PELVIS WITH CONTRAST TECHNIQUE: Multidetector CT imaging of the abdomen and pelvis was performed using the standard protocol following bolus administration of intravenous contrast. CONTRAST:  125mL ISOVUE-300 IOPAMIDOL (ISOVUE-300) INJECTION 61% COMPARISON:  06/09/2017 FINDINGS: Lower chest: No acute abnormality. Hepatobiliary: No focal liver abnormality is seen. No gallstones, gallbladder wall thickening, or biliary dilatation. Pancreas: Stable atrophic pancreas without evidence of mass or inflammation. Spleen: Normal in size without focal abnormality. Adrenals/Urinary Tract: Adrenal glands are unremarkable. Stable mild left hydronephrosis and hydroureter. No evidence of right-sided hydronephrosis. Bladder is unremarkable. Stomach/Bowel: No evidence of bowel obstruction or inflammation. No free air. Vascular/Lymphatic: At the level of the single remaining left-sided retroperitoneal drain, no further residual left retroperitoneal fluid collection is identified. There is recurrence of a small right-sided retroperitoneal fluid collection lateral to the right common iliac artery and iliac vein measuring approximately 1.9 cm in greatest diameter at the level of the iliac crest. At the level of the lower pole of the right kidney, a tiny 9 mm fluid collection is seen just lateral to the proximal ureter. In the left pelvis, there are 2 tiny residual lymphoceles adjacent to the left external iliac artery measuring approximately 11 mm and 10 mm in respective diameters. Reproductive: Status post hysterectomy. No adnexal masses. Other: No abdominal wall hernia  or abnormality. No abdominopelvic ascites. Musculoskeletal: No acute or significant osseous findings. IMPRESSION: Resolution of left-sided retroperitoneal lymphocele at the level of the single  remaining retroperitoneal drainage catheter. Small residual lymphoceles are identified in the right retroperitoneum lateral to the right common iliac artery and vein measuring approximately 1.9 cm in greatest diameter and adjacent to the lower pole of the right kidney measuring 9 mm. Two other small residual lymphoceles are seen adjacent to the left external iliac artery measuring 11 mm and 10 mm respectively. All of these residual small collections are far smaller than the original collections present postoperatively on the 04/29/2017 scan and are currently too small to warrant drainage. The left retroperitoneal drain was removed today. See separately dictated clinic note in Epic. Electronically Signed   By: Aletta Edouard M.D.   On: 06/24/2017 11:42   Ir Imaging Guided Port Insertion  Result Date: 07/07/2017 INDICATION: 67 year old female with a history of endometrial cancer. She requires durable venous access for chemotherapy and presents for portacatheter placement. EXAM: IMPLANTED PORT A CATH PLACEMENT WITH ULTRASOUND AND FLUOROSCOPIC GUIDANCE MEDICATIONS: 2 g Ancef; The antibiotic was administered within an appropriate time interval prior to skin puncture. ANESTHESIA/SEDATION: Versed 2 mg IV; Fentanyl 100 mcg IV; Moderate Sedation Time:  27 minutes The patient was continuously monitored during the procedure by the interventional radiology nurse under my direct supervision. FLUOROSCOPY TIME:  0 minutes, 18 seconds (5 mGy) COMPLICATIONS: None immediate. PROCEDURE: The right neck and chest was prepped with chlorhexidine, and draped in the usual sterile fashion using maximum barrier technique (cap and mask, sterile gown, sterile gloves, large sterile sheet, hand hygiene and cutaneous antiseptic). Antibiotic prophylaxis was provided with g Ancef administered IV one hour prior to skin incision. Local anesthesia was attained by infiltration with 1% lidocaine with epinephrine. Ultrasound demonstrated patency of  the right internal jugular vein, and this was documented with an image. Under real-time ultrasound guidance, this vein was accessed with a 21 gauge micropuncture needle and image documentation was performed. A small dermatotomy was made at the access site with an 11 scalpel. A 0.018" wire was advanced into the SVC and the access needle exchanged for a 37F micropuncture vascular sheath. The 0.018" wire was then removed and a 0.035" wire advanced into the IVC. An appropriate location for the subcutaneous reservoir was selected below the clavicle and an incision was made through the skin and underlying soft tissues. The subcutaneous tissues were then dissected using a combination of blunt and sharp surgical technique and a pocket was formed. A lumen power injectable portacatheter was then tunneled through the subcutaneous tissues from the pocket to the dermatotomy and the port reservoir placed within the subcutaneous pocket. The venous access site was then serially dilated and a peel away vascular sheath placed over the wire. The wire was removed and the port catheter advanced into position under fluoroscopic guidance. The catheter tip is positioned in the upper right atrium. This was documented with a spot image. The portacatheter was then tested and found to flush and aspirate well. The port was flushed with saline followed by 100 units/mL heparinized saline. The pocket was then closed in two layers using first subdermal inverted interrupted absorbable sutures followed by a running subcuticular suture. The epidermis was then sealed with Dermabond. The dermatotomy at the venous access site was also sealed with Dermabond. IMPRESSION: Successful placement of a right IJ approach Power Port with ultrasound and fluoroscopic guidance. The catheter is ready for use. Electronically Signed  By: Jacqulynn Cadet M.D.   On: 07/07/2017 15:54   Ir Radiologist Eval & Mgmt  Result Date: 06/24/2017 Please refer to notes tab for  details about interventional procedure. (Op Note)

## 2017-07-13 NOTE — Telephone Encounter (Signed)
I have sent  In new prescription to the pharmacy.

## 2017-07-13 NOTE — Telephone Encounter (Signed)
Call in #[phone] with [phone] rf

## 2017-07-13 NOTE — Telephone Encounter (Signed)
I just answered this on another message

## 2017-07-15 ENCOUNTER — Telehealth: Payer: Self-pay | Admitting: Oncology

## 2017-07-15 NOTE — Telephone Encounter (Signed)
Called Katie Palmer to see if she is ready to schedule radiation treatments.  She said she is still recovering from chemo but would like to go ahead and schedule radiation.

## 2017-07-16 ENCOUNTER — Telehealth: Payer: Self-pay | Admitting: *Deleted

## 2017-07-16 NOTE — Telephone Encounter (Signed)
Called patient to inform of [facility], spoke with patient and she is aware of these appts. 

## 2017-07-18 ENCOUNTER — Other Ambulatory Visit: Payer: Self-pay | Admitting: Family Medicine

## 2017-07-22 ENCOUNTER — Encounter: Payer: Self-pay | Admitting: General Practice

## 2017-07-22 ENCOUNTER — Telehealth: Payer: Self-pay | Admitting: Oncology

## 2017-07-22 NOTE — Progress Notes (Signed)
[place] Spiritual Care Note  Received and returned phone call from [facility] requesting emotional support and possibly referral for depression. She states that she is having difficulty getting out of bed due to pain/energy limitations, as well.  LVM encouraging her to report symptoms to MD, offering times of my availability for spiritual/emotional support, and contact info for Lauren Mullis/LCSW as additional resource for emotional support/referral.   Pauline Good, [facility] Pager [phone] Voicemail [phone]

## 2017-07-22 NOTE — Telephone Encounter (Signed)
Left a message for patient advising her of Dr. Calton Dach recommendation below.

## 2017-07-22 NOTE — Telephone Encounter (Signed)
Called patient to follow up on spiritual care note.  Katie Palmer said that since chemotherapy, she has no strength, her legs are giving out when she is walking and she keeps falling.  She also feels dizzy.  She said she is drinking a lot of water and is eating.  She said her blood sugar is "up and down."  She did ask if home physical therapy would help increase the strength in her legs.

## 2017-07-22 NOTE — Telephone Encounter (Signed)
I saw she had PT evaluation last August 2018 but was DC due to not returning to outpatient PT I do not recommend home PT  If she is interested for PT I can refer her to cancer rehab facility

## 2017-07-23 ENCOUNTER — Other Ambulatory Visit: Payer: Self-pay | Admitting: Obstetrics and Gynecology

## 2017-07-23 ENCOUNTER — Telehealth: Payer: Self-pay | Admitting: Oncology

## 2017-07-23 DIAGNOSIS — Z1231 Encounter for screening mammogram for malignant neoplasm of breast: Secondary | ICD-10-CM

## 2017-07-23 NOTE — Telephone Encounter (Signed)
Katie Palmer called and was advised that we will check her insurance to make sure the cancer rehab is covered.  She then asked if she can come in and see Dr. Alvy Bimler earlier next week than her appointment on Friday.  She said she is feeling better with the dizziness and weakness and wants to make sure she understands all of Dr .Calton Dach instructions for her next infusion.  She said she does not remember her last appointment because she was so nervous.

## 2017-07-23 NOTE — Telephone Encounter (Signed)
Katie Palmer left a message saying that she would be interested in cancer rehab as long as her insurance would cover it.

## 2017-07-26 ENCOUNTER — Telehealth: Payer: Self-pay | Admitting: Oncology

## 2017-07-26 NOTE — Telephone Encounter (Signed)
Can she come in at 8 am or 1130 am tomorrow for discussion? Needs 30 mins appt pls

## 2017-07-26 NOTE — Telephone Encounter (Signed)
[facility] and she would like to see Dr. Alvy Bimler at 11:30 tomorrow.

## 2017-07-27 ENCOUNTER — Encounter: Payer: Self-pay | Admitting: Hematology and Oncology

## 2017-07-27 ENCOUNTER — Inpatient Hospital Stay: Payer: Medicare Other | Attending: Gynecologic Oncology | Admitting: Hematology and Oncology

## 2017-07-27 DIAGNOSIS — Z5111 Encounter for antineoplastic chemotherapy: Secondary | ICD-10-CM | POA: Insufficient documentation

## 2017-07-27 DIAGNOSIS — C541 Malignant neoplasm of endometrium: Secondary | ICD-10-CM | POA: Diagnosis not present

## 2017-07-27 DIAGNOSIS — E860 Dehydration: Secondary | ICD-10-CM | POA: Insufficient documentation

## 2017-07-27 DIAGNOSIS — Z79899 Other long term (current) drug therapy: Secondary | ICD-10-CM | POA: Diagnosis not present

## 2017-07-27 DIAGNOSIS — Z794 Long term (current) use of insulin: Secondary | ICD-10-CM

## 2017-07-27 DIAGNOSIS — E1139 Type 2 diabetes mellitus with other diabetic ophthalmic complication: Secondary | ICD-10-CM

## 2017-07-27 DIAGNOSIS — R630 Anorexia: Secondary | ICD-10-CM | POA: Diagnosis not present

## 2017-07-27 DIAGNOSIS — IMO0001 Reserved for inherently not codable concepts without codable children: Secondary | ICD-10-CM

## 2017-07-27 DIAGNOSIS — F329 Major depressive disorder, single episode, unspecified: Secondary | ICD-10-CM | POA: Insufficient documentation

## 2017-07-27 DIAGNOSIS — K5909 Other constipation: Secondary | ICD-10-CM | POA: Diagnosis not present

## 2017-07-27 DIAGNOSIS — R531 Weakness: Secondary | ICD-10-CM

## 2017-07-27 DIAGNOSIS — G893 Neoplasm related pain (acute) (chronic): Secondary | ICD-10-CM | POA: Insufficient documentation

## 2017-07-27 NOTE — Assessment & Plan Note (Signed)
She has severe constipation with no bowel movement for 7 days She does feel that she might have the urge for a bowel movement I recommend rectal suppository along with aggressive oral laxatives

## 2017-07-27 NOTE — Assessment & Plan Note (Signed)
She has severe, poorly controlled diabetes to the point of near hyperosmolar ketotic, I recommend discontinuation of Taxol and this in effect will not need premedication oral dexamethasone at home In the meantime, she will continue close follow-up with her endocrinologist for further diabetes management Due to her high risk nature/situation, I will consult advanced home care service to monitor her blood sugar at home and symptom management at home

## 2017-07-27 NOTE — Progress Notes (Signed)
[place] OFFICE PROGRESS NOTE  Patient Care Team: Laurey Morale, MD as PCP - General Jacelyn Pi, MD as Consulting Physician (Endocrinology)  ASSESSMENT & PLAN:  Endometrial cancer [facility]) She tolerated treatment very poorly with significant, life-threatening side effects of treatment I recommend a trial of omission of Taxol with cycle 2 of therapy We discussed the risk and benefits of dose reduction versus omission of Taxol We discussed the role of adjuvant treatment that should not cause life-threatening side effects Ultimately, she is in agreement to try cycle 2 without Taxol I will reduce premedication dexamethasone to 4 mg only and she does not need to take oral dexamethasone at home She would not need G-CSF support with cycle 2 I plan to see her back again in 3 days for further supportive care and follow-up  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 ([place]) She has severe, poorly controlled diabetes to the point of near hyperosmolar ketotic, I recommend discontinuation of Taxol and this in effect will not need premedication oral dexamethasone at home In the meantime, she will continue close follow-up with her endocrinologist for further diabetes management Due to her high risk nature/situation, I will consult advanced home care service to monitor her blood sugar at home and symptom management at home  Other constipation She has severe constipation with no bowel movement for 7 days She does feel that she might have the urge for a bowel movement I recommend rectal suppository along with aggressive oral laxatives  Generalized weakness She has recent profound generalized weakness, exacerbated by side effects of treatment She felt that her balance is also off and she has fears of falls at home I recommend outpatient cancer rehab evaluation and management and she agreed with the plan of care   Orders Placed This Encounter  Procedures  .  Ambulatory referral to Physical Therapy    Referral Priority:   Routine    Referral Type:   Physical Medicine    Referral Reason:   Specialty Services Required    Requested Specialty:   Physical Therapy    Number of Visits Requested:   1  . Ambulatory referral to Home Health    Referral Priority:   Routine    Referral Type:   Home Health Care    Referral Reason:   Specialty Services Required    Requested Specialty:   [place]    Number of Visits Requested:   1    INTERVAL HISTORY: Please see below for problem oriented charting. She is being seen today per patient request due to significant side effects after cycle 1 of treatment. She was seen here briefly for evaluation of severe abdominal pain and constipation X-ray of the abdomen showed signs of constipation Her symptoms of feeling unwell started right after chemotherapy Her blood sugar peak over 400 at home causing confusion, blurriness of vision, severe diffuse pain, generalized weakness and almost a "near death experience" She added additional insulin treatment after consultation with her endocrinologist and avoided hospital admission She is still constipated for over 7 days despite taking 9 Senokot per day and magnesium citrate intermittently She denies nausea She has generalized pain which she associated with exacerbation of fibromyalgia and poorly controlled diabetes Her pain has subsequently subsided Her balance is poor and she has fears of falls at home She felt profoundly weak overall and has second thoughts about proceeding for further chemotherapy, and hence requested urgent evaluation/discussion today Her hair has started to fall out She  denies significant signs or symptoms of neuropathy She denies recent fever, chills or cough Her friend who is present today is requesting additional services for home nursing visit if possible to take care of the patient at home. The patient is also having questions in  regards to cancer rehab referral for her weakness She is very tearful and has significant anxiety/fear over potential side effects with repeated treatment in the near future  SUMMARY OF ONCOLOGIC HISTORY: Oncology History   High grade carcinosarcoma     Endometrial cancer ([place])   12/25/2015 Pathology Results    1. Ovary and fallopian tube, right - BENIGN OVARIAN FIBROTHECOMA. - NO ENDOMETRIOSIS OR MALIGNANCY. - BENIGN FALLOPIAN TUBE WITH BENIGN PARATUBAL CYST. 2. Endometrium, curettage - INACTIVE ENDOMETRIUM, BENIGN ENDOMETRIAL POLYP AND BENIGN SQUAMOUS FRAGMENTS. - NO HYPERPLASIA OR MALIGNANCY      12/25/2015 Surgery    PRE-OPERATIVE DIAGNOSIS:  RLQ PAIN, thickened endometrium  POST-OPERATIVE DIAGNOSIS:  R O lesion, adhesions, polyp in uterus  PROCEDURE:  Procedure(s): DILATATION AND CURETTAGE /HYSTEROSCOPY (N/A) LAPAROSCOPY OPERATIVE (N/A) LAPAROSCOPIC LYSIS OF ADHESIONS (N/A) LAPAROSCOPIC UNILATERAL SALPINGO OOPHORECTOMY (Right        01/25/2017 Initial Diagnosis    The patient reports postmenopausal bleeding since December, 2018. Pap smear showed AGUS.         03/17/2017 Pathology Results    Endometrial biopsy on 03/17/17 showed grade 3 endometrial adenocarcinoma.      03/26/2017 Imaging    Ct abdomen Diffuse endometrial thickening consistent with endometrial carcinoma, with probable focal area of deep myometrial invasion in the anterior fundus.  No evidence of extra uterine tumor extension, lymphadenopathy, or distant metastatic disease.  Two tiny bilateral lower lobe pulmonary nodules, largest measuring 5 mm. These are indeterminate, but likely benign. Recommend follow-up with chest CT without contrast in 3-6 months.      04/15/2017 Surgery    Operation: Robotic-assisted laparoscopic total hysterectomy with left salpingoophorectomy, SLN biopsy, para-aortic lymphadenectomy (bilateral), vaginal biopsy.  Surgeon: Donaciano Eva  Operative Findings:   : 80mm nodule on left proximal (upper) vaginal side wall (benign on frozen). 8cm normal appearing uterus, surgically absent right ovary and tube, normal left ovary somewhat adherent to round ligament. Indurated left external iliac SLN. No other suspicious nodes or apparent extrauterine disease        04/15/2017 Pathology Results    1. Vagina, biopsy - ATYPICAL CELLS, SUSPICIOUS BUT NOT DIAGNOSTIC OF MALIGNANCY. SEE NOTE 2. Lymph node, sentinel, biopsy, right presacral #1 - LYMPH NODE, NEGATIVE FOR MALIGNANCY (0/1) - IMMUNOSTAIN FOR PANKERATIN SHOWS FOCAL NONSPECIFIC LABELING 3. Lymph node, sentinel, biopsy, right presacral #2 - LYMPH NODE, NEGATIVE FOR MALIGNANCY (0/1) - IMMUNOSTAIN FOR PANKERATIN IS NEGATIVE 4. Lymph node, sentinel, biopsy, left external iliac - LYMPH NODE, NEGATIVE FOR MALIGNANCY (0/1) - IMMUNOSTAIN FOR PANKERATIN IS NEGATIVE 5. Lymph node, biopsy, right periaortic - TWO LYMPH NODES, NEGATIVE FOR MALIGNANCY (0/2) 6. Lymph node, biopsy, left periaortic - SEVEN LYMPH NODES, NEGATIVE FOR MALIGNANCY (0/7) 7. 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 SEE NOTE - TUMOR INVADES A LITTLE OVER HALF OF THE MYOMETRIUM (INVASION DEPTH OF 0.7 CM WHERE MYOMETRIUM IS 1.3 CM THICK) 1. Vagina, biopsy - ATYPICAL CELLS, SUSPICIOUS BUT NOT DIAGNOSTIC OF MALIGNANCY. SEE NOTE 2. Lymph node, sentinel, biopsy, right presacral #1 - LYMPH NODE, NEGATIVE FOR MALIGNANCY (0/1) - IMMUNOSTAIN FOR PANKERATIN SHOWS FOCAL NONSPECIFIC LABELING 3. Lymph node, sentinel,  biopsy, right presacral #2 - LYMPH NODE, NEGATIVE FOR MALIGNANCY (0/1) - IMMUNOSTAIN FOR PANKERATIN IS NEGATIVE 4. Lymph node, sentinel, biopsy, left external iliac - LYMPH NODE, NEGATIVE FOR MALIGNANCY (0/1) - IMMUNOSTAIN FOR PANKERATIN IS NEGATIVE 5. Lymph node, biopsy, right periaortic - TWO  LYMPH NODES, NEGATIVE FOR MALIGNANCY (0/2) 6. Lymph node, biopsy, left periaortic - SEVEN LYMPH NODES, NEGATIVE FOR MALIGNANCY (0/7) 7. 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 SEE NOTE - TUMOR INVADES A LITTLE OVER HALF OF THE MYOMETRIUM (INVASION DEPTH OF 0.7 CM WHERE MYOMETRIUM IS 1.3 CM THICK)  7. ONCOLOGY TABLE-UTERUS, CARCINOMA OR CARCINOSARCOMA 1 of 4 Duplicate copy FINAL for [place], [place] [phone]) Microscopic Comment(continued) Specimen: Uterus with left fallopian tube and ovary Procedure: Total hysterectomy with left salpingoophorectomy Lymph node sampling performed: Sentinel and non-sentinel Specimen integrity: Intact Maximum tumor size: 4.2 cm Histologic type: Carcinosarcoma (Malignant mullerian mixed tumor) Grade: High-grade Myometrial invasion: 0.7 cm where myometrium is 1.3 cm in thickness Cervical stromal involvement: Not identified Extent of involvement of other organs: Not involved Lymph - vascular invasion: Not identified Peritoneal washings: NA Lymph nodes: Examined: 3 Sentinel 9 Non-sentinel 12 Total Lymph nodes with metastasis: 0 Isolated tumor cells (< 0.2 mm): 0 Micrometastasis: (> 0.2 mm and < 2.0 mm): 0 Macrometastasis: (> 2.0 mm): 0 Extracapsular extension: NA TNM code: pT1b, pN0 FIGO Stage (based on pathologic findings, needs clinical correlation): 1B      04/29/2017 Imaging    CT abdomen Multiloculated bilateral retroperitoneal fluid collections, consistent with postoperative lymphoceles.  No evidence of hydronephrosis or other complication      05/31/3873 - 05/02/2017 Hospital Admission    She was admitted for evaluation of severe pain. CT showed fluid collection and IR was consulted. Drain was placed to drain the fluid collection      04/30/2017 Cancer Staging    Staging form: Corpus Uteri -  Carcinoma and Carcinosarcoma, AJCC 8th Edition - Pathologic: Stage IB (pT1b, pN0, cM0) - Signed by Heath Lark, MD on 04/30/2017      04/30/2017 Procedure    Successful abdominal abscess drain x2.      05/06/2017 Imaging    1. Interval retraction of right-sided percutaneous drainage catheter with end now coiled and locked peripheral the grossly unchanged right-sided retroperitoneal fluid collection, presumed postoperative seroma. 2. Unchanged positioning of left-sided percutaneous drainage catheter with end coiled and locked within the peripheral aspect of the grossly unchanged left-sided retroperitoneal fluid collection, presumed postoperative seroma.  PLAN: - The malpositioned right-sided retroperitoneal drainage catheter was removed at the patient's bedside without incident      05/18/2017 Imaging    Right retroperitoneal fluid collections are improved. The right drain has been repositioned into the more anterior fluid collection which is smaller than on the prior study.  The lateral left retroperitoneal fluid collection has resolved after drainage. The more medial and anterior fluid collection has increased in size. The drain is not coiled in this more medial fluid collection.      06/03/2017 Imaging    New left retroperitoneal drain has been placed into a fluid collection. This fluid collection has improved from 4.4 x 4.5 cm to 3.2 x 3.8 cm.  The other left-sided drain remains in place without associated abscess.  The right-sided fluid collection containing a drain has resolved.  No new fluid collection.  Other incidental findings are noted.      06/09/2017  Imaging    Unchanged position of left retroperitoneal pigtail drainage catheter, with decreasing size of residual rim enhancing fluid collection/abscess.  Interval removal of retroperitoneal pigtail drainage catheter from the right.  Mild dilation of the left collecting system and left ureter, with the proximal left ureter  inseparable from the inflammatory changes adjacent to the pigtail catheter. I would favor this not to represent ureteral obstruction, but instead reactive dilation.      06/24/2017 Imaging    Resolution of left-sided retroperitoneal lymphocele at the level of the single remaining retroperitoneal drainage catheter. Small residual lymphoceles are identified in the right retroperitoneum lateral to the right common iliac artery and vein measuring approximately 1.9 cm in greatest diameter and adjacent to the lower pole of the right kidney measuring 9 mm. Two other small residual lymphoceles are seen adjacent to the left external iliac artery measuring 11 mm and 10 mm respectively. All of these residual small collections are far smaller than the original collections present postoperatively on the 04/29/2017 scan and are currently too small to warrant drainage.  The left retroperitoneal drain was removed today. See separately dictated clinic note in Epic.      07/07/2017 Procedure    Successful placement of a right IJ approach Power Port with ultrasound and fluoroscopic guidance. The catheter is ready for use.      07/09/2017 -  Chemotherapy    The patient had carboplatin and taxol       REVIEW OF SYSTEMS:   Constitutional: Denies fevers, chills or abnormal weight loss Ears, nose, mouth, throat, and face: Denies mucositis or sore throat Respiratory: Denies cough, dyspnea or wheezes Cardiovascular: Denies palpitation, chest discomfort or lower extremity swelling Skin: Denies abnormal skin rashes Lymphatics: Denies new lymphadenopathy or easy bruising All other systems were reviewed with the patient and are negative.  I have reviewed the past medical history, past surgical history, social history and family history with the patient and they are unchanged from previous note.  ALLERGIES:  is allergic to armodafinil; invokana [canagliflozin]; lasix [furosemide]; and adhesive [tape].  MEDICATIONS:   Current Outpatient Medications  Medication Sig Dispense Refill  . Acetaminophen (TYLENOL PO) Take 1 tablet by mouth every 6 (six) hours as needed (pain/headache).    . ALPRAZolam (XANAX) 1 MG tablet TAKE 1/2 TO 1 TABLET BY MOUTH AT BEDTIME AS NEEDED FOR ANXIETY (Patient taking differently: TAKE 1/2 TO 1 TABLET (0.5 - 1 MG)  BY MOUTH AT BEDTIME AS NEEDED FOR ANXIETY) 30 tablet 5  . dexamethasone (DECADRON) 4 MG tablet Take 2 tabs the night before and 2 tabs the morning of chemotherapy, every 3 weeks 30 tablet 0  . ezetimibe (ZETIA) 10 MG tablet Take 10 mg by mouth at bedtime.   12  . gabapentin (NEURONTIN) 100 MG capsule Take 200 mg two times a day (Patient taking differently: 300 mg 2 (two) times daily. Take 200 mg two times a day) 120 capsule 0  . glucagon (GLUCAGON EMERGENCY) 1 MG injection Inject 1 mg into the muscle once as needed (low blood sugar).    . Insulin Human (INSULIN PUMP) SOLN Inject into the skin continuous. Use with Novolog    . lidocaine-prilocaine (EMLA) cream Apply to affected area once 30 g 3  . losartan-hydrochlorothiazide (HYZAAR) 50-12.5 MG tablet TAKE 1 TABLET BY MOUTH EVERY DAY 90 tablet 0  . ondansetron (ZOFRAN) 8 MG tablet Take 1 tablet (8 mg total) by mouth every 8 (eight) hours as needed for refractory nausea / vomiting.  Start on day 3 after chemo. 30 tablet 1  . prochlorperazine (COMPAZINE) 10 MG tablet TAKE 1 TABLET BY MOUTH EVERY 6 HOURS AS NEEDED FOR NAUSEA OR VOMITING 30 tablet 1  . senna (SENOKOT) 8.6 MG TABS tablet Take 1 tablet (8.6 mg total) by mouth at bedtime. 120 each 0  . SUMAtriptan (IMITREX) 100 MG tablet TAKE 1 TABLET BY MOUTH AS NEEDED FOR MIGRAINES. MAY REPEAT IN 2 HOURS IF NEEDED (Patient taking differently: Take 100 mg by mouth every 2 (two) hours as needed for migraine. ) 30 tablet 5  . traMADol (ULTRAM) 50 MG tablet TAKE 2 TABLETS BY MOUTH EVERY 4 HOURS AS NEEDED (Patient taking differently: TAKE 1 TABLET (50 MG) BY MOUTH EVERY 4 HOURS AS NEEDED  FOR PAIN) 360 tablet 1  . traMADol (ULTRAM) 50 MG tablet 1 to 2 Q four hours prn pain 15 tablet 0  . traZODone (DESYREL) 100 MG tablet Take 100 mg by mouth at bedtime.   1   No current facility-administered medications for this visit.     PHYSICAL EXAMINATION: ECOG PERFORMANCE STATUS: 2 - Symptomatic, <50% confined to bed  Vitals:   07/27/17 1216  BP: 121/72  Pulse: 76  Resp: 18  Temp: 97.7 F (36.5 C)  SpO2: 95%   Filed Weights   07/27/17 1216  Weight: 200 lb 3.2 oz (90.8 kg)    GENERAL:alert, no distress and comfortable.  She is intermittently tearful today NEURO: alert & oriented x 3 with fluent speech, no focal motor/sensory deficits  LABORATORY DATA:  I have reviewed the data as listed    Component Value Date/Time   NA 135 (L) 07/12/2017 1232   K 4.5 07/12/2017 1232   CL 96 (L) 07/12/2017 1232   CO2 30 (H) 07/12/2017 1232   GLUCOSE 277 (H) 07/12/2017 1232   BUN 22 07/12/2017 1232   CREATININE 1.32 (H) 07/12/2017 1232   CALCIUM 9.7 07/12/2017 1232   PROT 7.2 07/12/2017 1232   ALBUMIN 3.2 (L) 07/12/2017 1232   AST 19 07/12/2017 1232   ALT 13 07/12/2017 1232   ALKPHOS 124 07/12/2017 1232   BILITOT 1.0 07/12/2017 1232   GFRNONAA 40 (L) 07/12/2017 1232   GFRAA 47 (L) 07/12/2017 1232    No results found for: SPEP, UPEP  Lab Results  Component Value Date   WBC 7.3 07/12/2017   NEUTROABS 5.6 07/12/2017   HGB 13.4 07/12/2017   HCT 41.1 07/12/2017   MCV 84.0 07/12/2017   PLT 239 07/12/2017      Chemistry      Component Value Date/Time   NA 135 (L) 07/12/2017 1232   K 4.5 07/12/2017 1232   CL 96 (L) 07/12/2017 1232   CO2 30 (H) 07/12/2017 1232   BUN 22 07/12/2017 1232   CREATININE 1.32 (H) 07/12/2017 1232      Component Value Date/Time   CALCIUM 9.7 07/12/2017 1232   ALKPHOS 124 07/12/2017 1232   AST 19 07/12/2017 1232   ALT 13 07/12/2017 1232   BILITOT 1.0 07/12/2017 1232       RADIOGRAPHIC STUDIES: I have personally reviewed the  radiological images as listed and agreed with the findings in the report. Dg Abd 1 View  Result Date: 07/12/2017 CLINICAL DATA:  67 year old female with constipation. Abdominal pain since beginning chemotherapy (endometrial cancer) 1 week ago. Initial encounter. EXAM: ABDOMEN - 1 VIEW COMPARISON:  06/24/2017 CT. FINDINGS: Moderate stool transverse colon and descending colon. Gas within non dilated rectosigmoid colon. No evidence of  gas distended small bowel loops. The possibility of free intraperitoneal air cannot be assessed on a supine view. IMPRESSION: Moderate stool transverse colon and descending colon. Electronically Signed   By: Genia Del M.D.   On: 07/12/2017 16:20   Ir Imaging Guided Port Insertion  Result Date: 07/07/2017 INDICATION: 67 year old female with a history of endometrial cancer. She requires durable venous access for chemotherapy and presents for portacatheter placement. EXAM: IMPLANTED PORT A CATH PLACEMENT WITH ULTRASOUND AND FLUOROSCOPIC GUIDANCE MEDICATIONS: 2 g Ancef; The antibiotic was administered within an appropriate time interval prior to skin puncture. ANESTHESIA/SEDATION: Versed 2 mg IV; Fentanyl 100 mcg IV; Moderate Sedation Time:  27 minutes The patient was continuously monitored during the procedure by the interventional radiology nurse under my direct supervision. FLUOROSCOPY TIME:  0 minutes, 18 seconds (5 mGy) COMPLICATIONS: None immediate. PROCEDURE: The right neck and chest was prepped with chlorhexidine, and draped in the usual sterile fashion using maximum barrier technique (cap and mask, sterile gown, sterile gloves, large sterile sheet, hand hygiene and cutaneous antiseptic). Antibiotic prophylaxis was provided with g Ancef administered IV one hour prior to skin incision. Local anesthesia was attained by infiltration with 1% lidocaine with epinephrine. Ultrasound demonstrated patency of the right internal jugular vein, and this was documented with an image.  Under real-time ultrasound guidance, this vein was accessed with a 21 gauge micropuncture needle and image documentation was performed. A small dermatotomy was made at the access site with an 11 scalpel. A 0.018" wire was advanced into the SVC and the access needle exchanged for a 67F micropuncture vascular sheath. The 0.018" wire was then removed and a 0.035" wire advanced into the IVC. An appropriate location for the subcutaneous reservoir was selected below the clavicle and an incision was made through the skin and underlying soft tissues. The subcutaneous tissues were then dissected using a combination of blunt and sharp surgical technique and a pocket was formed. A lumen power injectable portacatheter was then tunneled through the subcutaneous tissues from the pocket to the dermatotomy and the port reservoir placed within the subcutaneous pocket. The venous access site was then serially dilated and a peel away vascular sheath placed over the wire. The wire was removed and the port catheter advanced into position under fluoroscopic guidance. The catheter tip is positioned in the upper right atrium. This was documented with a spot image. The portacatheter was then tested and found to flush and aspirate well. The port was flushed with saline followed by 100 units/mL heparinized saline. The pocket was then closed in two layers using first subdermal inverted interrupted absorbable sutures followed by a running subcuticular suture. The epidermis was then sealed with Dermabond. The dermatotomy at the venous access site was also sealed with Dermabond. IMPRESSION: Successful placement of a right IJ approach Power Port with ultrasound and fluoroscopic guidance. The catheter is ready for use. Electronically Signed   By: Jacqulynn Cadet M.D.   On: 07/07/2017 15:54    All questions were answered. The patient knows to call the clinic with any problems, questions or concerns. No barriers to learning was detected.  I  spent 30 minutes counseling the patient face to face. The total time spent in the appointment was 40 minutes and more than 50% was on counseling and review of test results  Heath Lark, MD 07/27/2017 4:45 PM

## 2017-07-27 NOTE — Assessment & Plan Note (Signed)
She has recent profound generalized weakness, exacerbated by side effects of treatment She felt that her balance is also off and she has fears of falls at home I recommend outpatient cancer rehab evaluation and management and she agreed with the plan of care

## 2017-07-27 NOTE — Assessment & Plan Note (Signed)
She tolerated treatment very poorly with significant, life-threatening side effects of treatment I recommend a trial of omission of Taxol with cycle 2 of therapy We discussed the risk and benefits of dose reduction versus omission of Taxol We discussed the role of adjuvant treatment that should not cause life-threatening side effects Ultimately, she is in agreement to try cycle 2 without Taxol I will reduce premedication dexamethasone to 4 mg only and she does not need to take oral dexamethasone at home She would not need G-CSF support with cycle 2 I plan to see her back again in 3 days for further supportive care and follow-up

## 2017-07-30 ENCOUNTER — Encounter: Payer: Self-pay | Admitting: Hematology and Oncology

## 2017-07-30 ENCOUNTER — Inpatient Hospital Stay (HOSPITAL_BASED_OUTPATIENT_CLINIC_OR_DEPARTMENT_OTHER): Payer: Medicare Other | Admitting: Hematology and Oncology

## 2017-07-30 ENCOUNTER — Telehealth: Payer: Self-pay | Admitting: *Deleted

## 2017-07-30 ENCOUNTER — Inpatient Hospital Stay: Payer: Medicare Other

## 2017-07-30 ENCOUNTER — Telehealth: Payer: Self-pay | Admitting: Hematology and Oncology

## 2017-07-30 DIAGNOSIS — C541 Malignant neoplasm of endometrium: Secondary | ICD-10-CM

## 2017-07-30 DIAGNOSIS — Z794 Long term (current) use of insulin: Secondary | ICD-10-CM

## 2017-07-30 DIAGNOSIS — K5909 Other constipation: Secondary | ICD-10-CM

## 2017-07-30 DIAGNOSIS — IMO0001 Reserved for inherently not codable concepts without codable children: Secondary | ICD-10-CM

## 2017-07-30 DIAGNOSIS — E1139 Type 2 diabetes mellitus with other diabetic ophthalmic complication: Secondary | ICD-10-CM | POA: Diagnosis not present

## 2017-07-30 DIAGNOSIS — R531 Weakness: Secondary | ICD-10-CM

## 2017-07-30 DIAGNOSIS — F411 Generalized anxiety disorder: Secondary | ICD-10-CM

## 2017-07-30 DIAGNOSIS — I952 Hypotension due to drugs: Secondary | ICD-10-CM | POA: Diagnosis not present

## 2017-07-30 DIAGNOSIS — Z5111 Encounter for antineoplastic chemotherapy: Secondary | ICD-10-CM | POA: Diagnosis not present

## 2017-07-30 LAB — CBC WITH DIFFERENTIAL (CANCER CENTER ONLY)
Basophils Absolute: 0 10*3/uL (ref 0.0–0.1)
Basophils Relative: 1 %
Eosinophils Absolute: 0 10*3/uL (ref 0.0–0.5)
Eosinophils Relative: 1 %
HEMATOCRIT: 30.8 % — AB (ref 34.8–46.6)
HEMOGLOBIN: 10.4 g/dL — AB (ref 11.6–15.9)
LYMPHS ABS: 1.1 10*3/uL (ref 0.9–3.3)
Lymphocytes Relative: 20 %
MCH: 28.2 pg (ref 25.1–34.0)
MCHC: 33.8 g/dL (ref 31.5–36.0)
MCV: 83.6 fL (ref 79.5–101.0)
MONOS PCT: 6 %
Monocytes Absolute: 0.4 10*3/uL (ref 0.1–0.9)
NEUTROS ABS: 4.1 10*3/uL (ref 1.5–6.5)
Neutrophils Relative %: 72 %
Platelet Count: 163 10*3/uL (ref 145–400)
RBC: 3.68 MIL/uL — ABNORMAL LOW (ref 3.70–5.45)
RDW: 15.9 % — ABNORMAL HIGH (ref 11.2–14.5)
WBC Count: 5.7 10*3/uL (ref 3.9–10.3)

## 2017-07-30 LAB — COMPREHENSIVE METABOLIC PANEL
ALBUMIN: 2.9 g/dL — AB (ref 3.5–5.0)
ALK PHOS: 91 U/L (ref 38–126)
ALT: 11 U/L (ref 0–44)
AST: 15 U/L (ref 15–41)
Anion gap: 10 (ref 5–15)
BILIRUBIN TOTAL: 0.5 mg/dL (ref 0.3–1.2)
BUN: 10 mg/dL (ref 8–23)
CALCIUM: 9.2 mg/dL (ref 8.9–10.3)
CO2: 33 mmol/L — ABNORMAL HIGH (ref 22–32)
CREATININE: 0.87 mg/dL (ref 0.44–1.00)
Chloride: 99 mmol/L (ref 98–111)
GFR calc Af Amer: >60 mL/min (ref >60)
GFR calc non Af Amer: >60 mL/min (ref >60)
GLUCOSE: 233 mg/dL — AB (ref 70–99)
Potassium: 3.7 mmol/L (ref 3.5–5.1)
Sodium: 142 mmol/L (ref 135–145)
TOTAL PROTEIN: 6.4 g/dL — AB (ref 6.5–8.1)

## 2017-07-30 MED ORDER — HEPARIN SOD (PORK) LOCK FLUSH 100 UNIT/ML IV SOLN
500.0000 [IU] | Freq: Once | INTRAVENOUS | Status: AC | PRN
  Administered 2017-07-30: 500 [IU]
  Filled 2017-07-30: qty 5

## 2017-07-30 MED ORDER — SODIUM CHLORIDE 0.9 % IV SOLN
Freq: Once | INTRAVENOUS | Status: AC
  Administered 2017-07-30: 12:00:00 via INTRAVENOUS
  Filled 2017-07-30: qty 5

## 2017-07-30 MED ORDER — PALONOSETRON HCL INJECTION 0.25 MG/5ML
INTRAVENOUS | Status: AC
  Filled 2017-07-30: qty 5

## 2017-07-30 MED ORDER — PALONOSETRON HCL INJECTION 0.25 MG/5ML
0.2500 mg | Freq: Once | INTRAVENOUS | Status: AC
  Administered 2017-07-30: 0.25 mg via INTRAVENOUS

## 2017-07-30 MED ORDER — SODIUM CHLORIDE 0.9% FLUSH
10.0000 mL | INTRAVENOUS | Status: DC | PRN
  Administered 2017-07-30: 10 mL
  Filled 2017-07-30: qty 10

## 2017-07-30 MED ORDER — SODIUM CHLORIDE 0.9% FLUSH
10.0000 mL | Freq: Once | INTRAVENOUS | Status: AC
  Administered 2017-07-30: 10 mL
  Filled 2017-07-30: qty 10

## 2017-07-30 MED ORDER — SODIUM CHLORIDE 0.9 % IV SOLN
Freq: Once | INTRAVENOUS | Status: AC
  Administered 2017-07-30: 11:00:00 via INTRAVENOUS

## 2017-07-30 MED ORDER — SODIUM CHLORIDE 0.9 % IV SOLN
500.0000 mg | Freq: Once | INTRAVENOUS | Status: AC
  Administered 2017-07-30: 500 mg via INTRAVENOUS
  Filled 2017-07-30: qty 50

## 2017-07-30 MED ORDER — FAMOTIDINE IN NACL 20-0.9 MG/50ML-% IV SOLN: 20.0000 mg | Freq: Once | INTRAVENOUS | Status: DC

## 2017-07-30 MED ORDER — DIPHENHYDRAMINE HCL 50 MG/ML IJ SOLN: 25.0000 mg | Freq: Once | INTRAMUSCULAR | Status: DC

## 2017-07-30 NOTE — Assessment & Plan Note (Signed)
She had recent severe, poorly controlled diabetes to the point of near hyperosmolar ketotic coma I recommend discontinuation of Taxol and this in effect will not need premedication oral dexamethasone at home In the meantime, she will continue close follow-up with her endocrinologist for further diabetes management

## 2017-07-30 NOTE — Patient Instructions (Signed)
[facility] Discharge Instructions for Patients Receiving Chemotherapy  Today you received the following chemotherapy agents: Carboplatin (Paraplatin)  To help prevent nausea and vomiting after your treatment, we encourage you to take your nausea medication as prescribed. Received Aloxi during treatment today-->Take Compazine (not Zofran) for the next 3 days as needed.    If you develop nausea and vomiting that is not controlled by your nausea medication, call the clinic.   BELOW ARE SYMPTOMS THAT SHOULD BE REPORTED IMMEDIATELY:  *FEVER GREATER THAN 100.5 F  *CHILLS WITH OR WITHOUT FEVER  NAUSEA AND VOMITING THAT IS NOT CONTROLLED WITH YOUR NAUSEA MEDICATION  *UNUSUAL SHORTNESS OF BREATH  *UNUSUAL BRUISING OR BLEEDING  TENDERNESS IN MOUTH AND THROAT WITH OR WITHOUT PRESENCE OF ULCERS  *URINARY PROBLEMS  *BOWEL PROBLEMS  UNUSUAL RASH Items with * indicate a potential emergency and should be followed up as soon as possible.  Feel free to call the clinic should you have any questions or concerns. The clinic phone number is [phone].  Please show the [place] at check-in to the Emergency Department and triage nurse.

## 2017-07-30 NOTE — Progress Notes (Signed)
VO from Dr Alvy Bimler to decrease carboplatin to 500mg  and hold benadryl and pepcid since holding taxol this cycle

## 2017-07-30 NOTE — Progress Notes (Signed)
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 ([place]) She had recent severe, poorly controlled diabetes to the point of near hyperosmolar ketotic coma I recommend discontinuation of Taxol and this in effect will not need premedication oral dexamethasone at home In the meantime, she will continue close follow-up with her endocrinologist for further diabetes management  Drug-induced hypotension She is noted to be hypotensive and that could contribute to her weakness I recommend reducing the dose of her blood pressure medication by half and recommend she checks her blood pressure twice a day If her blood pressure remain low next week, I might recommend discontinuation of blood pressure medications altogether  Generalized weakness She has significant generalized weakness and risks of falls I have refer her to cancer rehab facility for outpatient therapy  Generalized anxiety disorder The patient appears less anxious today She has declined home visits by advanced home care nursing staff I recommend outpatient follow-up next week for supportive care and she agreed with the plan of care  Other constipation Constipation has resolved recently I recommend her to continue on chronic laxative therapy   Orders Placed This Encounter  Procedures  . Comprehensive metabolic panel    INTERVAL HISTORY: Please see below for problem oriented charting. She returns for further  follow-up She felt better since the last time I saw her Constipation has resolved Her blood sugar ranges around 150 or so over the last few days She is eating better She denies diffuse pain She is declining advanced home care service She denies fever or chills No nausea, vomiting but remained persistently weak  SUMMARY OF ONCOLOGIC HISTORY: Oncology History   High grade carcinosarcoma     Endometrial cancer ([place])   12/25/2015 Pathology Results    1. Ovary and fallopian tube, right - BENIGN OVARIAN FIBROTHECOMA. - NO ENDOMETRIOSIS OR MALIGNANCY. - BENIGN FALLOPIAN TUBE WITH BENIGN PARATUBAL CYST. 2. Endometrium, curettage - INACTIVE ENDOMETRIUM, BENIGN ENDOMETRIAL POLYP AND BENIGN SQUAMOUS FRAGMENTS. - NO HYPERPLASIA OR MALIGNANCY      12/25/2015 Surgery    PRE-OPERATIVE DIAGNOSIS:  RLQ PAIN, thickened endometrium  POST-OPERATIVE DIAGNOSIS:  R O lesion, adhesions, polyp in uterus  PROCEDURE:  Procedure(s): DILATATION AND CURETTAGE /HYSTEROSCOPY (N/A) LAPAROSCOPY OPERATIVE (N/A) LAPAROSCOPIC LYSIS OF ADHESIONS (N/A) LAPAROSCOPIC UNILATERAL SALPINGO OOPHORECTOMY (Right        01/25/2017 Initial Diagnosis    The patient reports postmenopausal bleeding since December, 2018. Pap smear showed AGUS.         03/17/2017 Pathology Results    Endometrial biopsy on 03/17/17 showed grade 3 endometrial adenocarcinoma.      03/26/2017 Imaging    Ct abdomen Diffuse endometrial thickening consistent with endometrial carcinoma, with probable focal area of deep myometrial invasion in the anterior fundus.  No evidence of extra uterine tumor extension, lymphadenopathy, or distant metastatic disease.  Two tiny bilateral lower lobe pulmonary nodules, largest measuring 5 mm. These are indeterminate, but likely benign. Recommend follow-up with chest CT without contrast in 3-6 months.      04/15/2017  Surgery    Operation: Robotic-assisted laparoscopic total hysterectomy with left  salpingoophorectomy, SLN biopsy, para-aortic lymphadenectomy (bilateral), vaginal biopsy.  Surgeon: Donaciano Eva  Operative Findings:  : 53mm nodule on left proximal (upper) vaginal side wall (benign on frozen). 8cm normal appearing uterus, surgically absent right ovary and tube, normal left ovary somewhat adherent to round ligament. Indurated left external iliac SLN. No other suspicious nodes or apparent extrauterine disease        04/15/2017 Pathology Results    1. Vagina, biopsy - ATYPICAL CELLS, SUSPICIOUS BUT NOT DIAGNOSTIC OF MALIGNANCY. SEE NOTE 2. Lymph node, sentinel, biopsy, right presacral #1 - LYMPH NODE, NEGATIVE FOR MALIGNANCY (0/1) - IMMUNOSTAIN FOR PANKERATIN SHOWS FOCAL NONSPECIFIC LABELING 3. Lymph node, sentinel, biopsy, right presacral #2 - LYMPH NODE, NEGATIVE FOR MALIGNANCY (0/1) - IMMUNOSTAIN FOR PANKERATIN IS NEGATIVE 4. Lymph node, sentinel, biopsy, left external iliac - LYMPH NODE, NEGATIVE FOR MALIGNANCY (0/1) - IMMUNOSTAIN FOR PANKERATIN IS NEGATIVE 5. Lymph node, biopsy, right periaortic - TWO LYMPH NODES, NEGATIVE FOR MALIGNANCY (0/2) 6. Lymph node, biopsy, left periaortic - SEVEN LYMPH NODES, NEGATIVE FOR MALIGNANCY (0/7) 7. 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 SEE NOTE - TUMOR INVADES A LITTLE OVER HALF OF THE MYOMETRIUM (INVASION DEPTH OF 0.7 CM WHERE MYOMETRIUM IS 1.3 CM THICK) 1. Vagina, biopsy - ATYPICAL CELLS, SUSPICIOUS BUT NOT DIAGNOSTIC OF MALIGNANCY. SEE NOTE 2. Lymph node, sentinel, biopsy, right presacral #1 - LYMPH NODE, NEGATIVE FOR MALIGNANCY (0/1) - IMMUNOSTAIN FOR PANKERATIN SHOWS FOCAL NONSPECIFIC LABELING 3. Lymph node, sentinel, biopsy, right presacral #2 - LYMPH NODE, NEGATIVE FOR MALIGNANCY (0/1) - IMMUNOSTAIN FOR PANKERATIN IS NEGATIVE 4. Lymph node, sentinel, biopsy, left  external iliac - LYMPH NODE, NEGATIVE FOR MALIGNANCY (0/1) - IMMUNOSTAIN FOR PANKERATIN IS NEGATIVE 5. Lymph node, biopsy, right periaortic - TWO LYMPH NODES, NEGATIVE FOR MALIGNANCY (0/2) 6. Lymph node, biopsy, left periaortic - SEVEN LYMPH NODES, NEGATIVE FOR MALIGNANCY (0/7) 7. 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 SEE NOTE - TUMOR INVADES A LITTLE OVER HALF OF THE MYOMETRIUM (INVASION DEPTH OF 0.7 CM WHERE MYOMETRIUM IS 1.3 CM THICK)  7. ONCOLOGY TABLE-UTERUS, CARCINOMA OR CARCINOSARCOMA 1 of 4 Duplicate copy FINAL for Grater, Miela [phone]) Microscopic Comment(continued) Specimen: Uterus with left fallopian tube and ovary Procedure: Total hysterectomy with left salpingoophorectomy Lymph node sampling performed: Sentinel and non-sentinel Specimen integrity: Intact Maximum tumor size: 4.2 cm Histologic type: Carcinosarcoma (Malignant mullerian mixed tumor) Grade: High-grade Myometrial invasion: 0.7 cm where myometrium is 1.3 cm in thickness Cervical stromal involvement: Not identified Extent of involvement of other organs: Not involved Lymph - vascular invasion: Not identified Peritoneal washings: NA Lymph nodes: Examined: 3 Sentinel 9 Non-sentinel 12 Total Lymph nodes with metastasis: 0 Isolated tumor cells (< 0.2 mm): 0 Micrometastasis: (> 0.2 mm and < 2.0 mm): 0 Macrometastasis: (> 2.0 mm): 0 Extracapsular extension: NA TNM code: pT1b, pN0 FIGO Stage (based on pathologic findings, needs clinical correlation): 1B      04/29/2017 Imaging    CT abdomen Multiloculated bilateral retroperitoneal fluid collections, consistent with postoperative lymphoceles.  No evidence of hydronephrosis or other complication      06/28/9526 - 05/02/2017 Hospital Admission    She was admitted for evaluation of severe pain. CT showed fluid  collection and IR was consulted.  Drain was placed to drain the fluid collection      04/30/2017 Cancer Staging    Staging form: Corpus Uteri - Carcinoma and Carcinosarcoma, AJCC 8th Edition - Pathologic: Stage IB (pT1b, pN0, cM0) - Signed by Heath Lark, MD on 04/30/2017      04/30/2017 Procedure    Successful abdominal abscess drain x2.      05/06/2017 Imaging    1. Interval retraction of right-sided percutaneous drainage catheter with end now coiled and locked peripheral the grossly unchanged right-sided retroperitoneal fluid collection, presumed postoperative seroma. 2. Unchanged positioning of left-sided percutaneous drainage catheter with end coiled and locked within the peripheral aspect of the grossly unchanged left-sided retroperitoneal fluid collection, presumed postoperative seroma.  PLAN: - The malpositioned right-sided retroperitoneal drainage catheter was removed at the patient's bedside without incident      05/18/2017 Imaging    Right retroperitoneal fluid collections are improved. The right drain has been repositioned into the more anterior fluid collection which is smaller than on the prior study.  The lateral left retroperitoneal fluid collection has resolved after drainage. The more medial and anterior fluid collection has increased in size. The drain is not coiled in this more medial fluid collection.      06/03/2017 Imaging    New left retroperitoneal drain has been placed into a fluid collection. This fluid collection has improved from 4.4 x 4.5 cm to 3.2 x 3.8 cm.  The other left-sided drain remains in place without associated abscess.  The right-sided fluid collection containing a drain has resolved.  No new fluid collection.  Other incidental findings are noted.      06/09/2017 Imaging    Unchanged position of left retroperitoneal pigtail drainage catheter, with decreasing size of residual rim enhancing fluid collection/abscess.  Interval removal of  retroperitoneal pigtail drainage catheter from the right.  Mild dilation of the left collecting system and left ureter, with the proximal left ureter inseparable from the inflammatory changes adjacent to the pigtail catheter. I would favor this not to represent ureteral obstruction, but instead reactive dilation.      06/24/2017 Imaging    Resolution of left-sided retroperitoneal lymphocele at the level of the single remaining retroperitoneal drainage catheter. Small residual lymphoceles are identified in the right retroperitoneum lateral to the right common iliac artery and vein measuring approximately 1.9 cm in greatest diameter and adjacent to the lower pole of the right kidney measuring 9 mm. Two other small residual lymphoceles are seen adjacent to the left external iliac artery measuring 11 mm and 10 mm respectively. All of these residual small collections are far smaller than the original collections present postoperatively on the 04/29/2017 scan and are currently too small to warrant drainage.  The left retroperitoneal drain was removed today. See separately dictated clinic note in Epic.      07/07/2017 Procedure    Successful placement of a right IJ approach Power Port with ultrasound and fluoroscopic guidance. The catheter is ready for use.      07/09/2017 -  Chemotherapy    The patient had carboplatin and taxol       REVIEW OF SYSTEMS:   Constitutional: Denies fevers, chills or abnormal weight loss Eyes: Denies blurriness of vision Ears, nose, mouth, throat, and face: Denies mucositis or sore throat Respiratory: Denies cough, dyspnea or wheezes Cardiovascular: Denies palpitation, chest discomfort or lower extremity swelling Skin: Denies abnormal skin rashes Lymphatics: Denies new lymphadenopathy or easy bruising Behavioral/Psych: Mood is stable, no new changes  All  other systems were reviewed with the patient and are negative.  I have reviewed the past medical history, past  surgical history, social history and family history with the patient and they are unchanged from previous note.  ALLERGIES:  is allergic to armodafinil; invokana [canagliflozin]; lasix [furosemide]; and adhesive [tape].  MEDICATIONS:  Current Outpatient Medications  Medication Sig Dispense Refill  . Acetaminophen (TYLENOL PO) Take 1 tablet by mouth every 6 (six) hours as needed (pain/headache).    . ALPRAZolam (XANAX) 1 MG tablet TAKE 1/2 TO 1 TABLET BY MOUTH AT BEDTIME AS NEEDED FOR ANXIETY (Patient taking differently: TAKE 1/2 TO 1 TABLET (0.5 - 1 MG)  BY MOUTH AT BEDTIME AS NEEDED FOR ANXIETY) 30 tablet 5  . dexamethasone (DECADRON) 4 MG tablet Take 2 tabs the night before and 2 tabs the morning of chemotherapy, every 3 weeks 30 tablet 0  . ezetimibe (ZETIA) 10 MG tablet Take 10 mg by mouth at bedtime.   12  . gabapentin (NEURONTIN) 100 MG capsule Take 200 mg two times a day (Patient taking differently: 300 mg 2 (two) times daily. Take 200 mg two times a day) 120 capsule 0  . glucagon (GLUCAGON EMERGENCY) 1 MG injection Inject 1 mg into the muscle once as needed (low blood sugar).    . Insulin Human (INSULIN PUMP) SOLN Inject into the skin continuous. Use with Novolog    . lidocaine-prilocaine (EMLA) cream Apply to affected area once 30 g 3  . losartan-hydrochlorothiazide (HYZAAR) 50-12.5 MG tablet TAKE 1 TABLET BY MOUTH EVERY DAY 90 tablet 0  . ondansetron (ZOFRAN) 8 MG tablet Take 1 tablet (8 mg total) by mouth every 8 (eight) hours as needed for refractory nausea / vomiting. Start on day 3 after chemo. 30 tablet 1  . prochlorperazine (COMPAZINE) 10 MG tablet TAKE 1 TABLET BY MOUTH EVERY 6 HOURS AS NEEDED FOR NAUSEA OR VOMITING 30 tablet 1  . senna (SENOKOT) 8.6 MG TABS tablet Take 1 tablet (8.6 mg total) by mouth at bedtime. 120 each 0  . SUMAtriptan (IMITREX) 100 MG tablet TAKE 1 TABLET BY MOUTH AS NEEDED FOR MIGRAINES. MAY REPEAT IN 2 HOURS IF NEEDED (Patient taking differently: Take 100  mg by mouth every 2 (two) hours as needed for migraine. ) 30 tablet 5  . traMADol (ULTRAM) 50 MG tablet TAKE 2 TABLETS BY MOUTH EVERY 4 HOURS AS NEEDED (Patient taking differently: TAKE 1 TABLET (50 MG) BY MOUTH EVERY 4 HOURS AS NEEDED FOR PAIN) 360 tablet 1  . traMADol (ULTRAM) 50 MG tablet 1 to 2 Q four hours prn pain 15 tablet 0  . traZODone (DESYREL) 100 MG tablet Take 100 mg by mouth at bedtime.   1   No current facility-administered medications for this visit.    Facility-Administered Medications Ordered in Other Visits  Medication Dose Route Frequency Provider Last Rate Last Dose  . CARBOplatin (PARAPLATIN) 500 mg in sodium chloride 0.9 % 250 mL chemo infusion  500 mg Intravenous Once Nami Strawder, MD      . heparin lock flush 100 unit/mL  500 Units Intracatheter Once PRN Alvy Bimler, Piedad Standiford, MD      . sodium chloride flush (NS) 0.9 % injection 10 mL  10 mL Intracatheter PRN Alvy Bimler, Takeira Yanes, MD        PHYSICAL EXAMINATION: ECOG PERFORMANCE STATUS: 1 - Symptomatic but completely ambulatory  Vitals:   07/30/17 1005  BP: (!) 98/59  Pulse: 80  Resp: 18  Temp: 97.9 F (36.6 C)  SpO2: 98%   Filed Weights    GENERAL:alert, no distress and comfortable SKIN: skin color, texture, turgor are normal, no rashes or significant lesions EYES: normal, Conjunctiva are pink and non-injected, sclera clear OROPHARYNX:no exudate, no erythema and lips, buccal mucosa, and tongue normal  NECK: supple, thyroid normal size, non-tender, without nodularity LYMPH:  no palpable lymphadenopathy in the cervical, axillary or inguinal LUNGS: clear to auscultation and percussion with normal breathing effort HEART: regular rate & rhythm and no murmurs and no lower extremity edema ABDOMEN:abdomen soft, non-tender and normal bowel sounds Musculoskeletal:no cyanosis of digits and no clubbing  NEURO: alert & oriented x 3 with fluent speech, no focal motor/sensory deficits  LABORATORY DATA:  I have reviewed the data as  listed    Component Value Date/Time   NA 142 07/30/2017 0927   K 3.7 07/30/2017 0927   CL 99 07/30/2017 0927   CO2 33 (H) 07/30/2017 0927   GLUCOSE 233 (H) 07/30/2017 0927   BUN 10 07/30/2017 0927   CREATININE 0.87 07/30/2017 0927   CREATININE 1.32 (H) 07/12/2017 1232   CALCIUM 9.2 07/30/2017 0927   PROT 6.4 (L) 07/30/2017 0927   ALBUMIN 2.9 (L) 07/30/2017 0927   AST 15 07/30/2017 0927   AST 19 07/12/2017 1232   ALT 11 07/30/2017 0927   ALT 13 07/12/2017 1232   ALKPHOS 91 07/30/2017 0927   BILITOT 0.5 07/30/2017 0927   BILITOT 1.0 07/12/2017 1232   GFRNONAA >60 07/30/2017 0927   GFRNONAA 40 (L) 07/12/2017 1232   GFRAA >60 07/30/2017 0927   GFRAA 47 (L) 07/12/2017 1232    No results found for: SPEP, UPEP  Lab Results  Component Value Date   WBC 5.7 07/30/2017   NEUTROABS 4.1 07/30/2017   HGB 10.4 (L) 07/30/2017   HCT 30.8 (L) 07/30/2017   MCV 83.6 07/30/2017   PLT 163 07/30/2017      Chemistry      Component Value Date/Time   NA 142 07/30/2017 0927   K 3.7 07/30/2017 0927   CL 99 07/30/2017 0927   CO2 33 (H) 07/30/2017 0927   BUN 10 07/30/2017 0927   CREATININE 0.87 07/30/2017 0927   CREATININE 1.32 (H) 07/12/2017 1232      Component Value Date/Time   CALCIUM 9.2 07/30/2017 0927   ALKPHOS 91 07/30/2017 0927   AST 15 07/30/2017 0927   AST 19 07/12/2017 1232   ALT 11 07/30/2017 0927   ALT 13 07/12/2017 1232   BILITOT 0.5 07/30/2017 0927   BILITOT 1.0 07/12/2017 1232       RADIOGRAPHIC STUDIES: I have personally reviewed the radiological images as listed and agreed with the findings in the report. Dg Abd 1 View  Result Date: 07/12/2017 CLINICAL DATA:  67 year old female with constipation. Abdominal pain since beginning chemotherapy (endometrial cancer) 1 week ago. Initial encounter. EXAM: ABDOMEN - 1 VIEW COMPARISON:  06/24/2017 CT. FINDINGS: Moderate stool transverse colon and descending colon. Gas within non dilated rectosigmoid colon. No evidence of  gas distended small bowel loops. The possibility of free intraperitoneal air cannot be assessed on a supine view. IMPRESSION: Moderate stool transverse colon and descending colon. Electronically Signed   By: Genia Del M.D.   On: 07/12/2017 16:20   Ir Imaging Guided Port Insertion  Result Date: 07/07/2017 INDICATION: 67 year old female with a history of endometrial cancer. She requires durable venous access for chemotherapy and presents for portacatheter placement. EXAM: IMPLANTED PORT A CATH PLACEMENT WITH ULTRASOUND AND FLUOROSCOPIC GUIDANCE MEDICATIONS: 2  g Ancef; The antibiotic was administered within an appropriate time interval prior to skin puncture. ANESTHESIA/SEDATION: Versed 2 mg IV; Fentanyl 100 mcg IV; Moderate Sedation Time:  27 minutes The patient was continuously monitored during the procedure by the interventional radiology nurse under my direct supervision. FLUOROSCOPY TIME:  0 minutes, 18 seconds (5 mGy) COMPLICATIONS: None immediate. PROCEDURE: The right neck and chest was prepped with chlorhexidine, and draped in the usual sterile fashion using maximum barrier technique (cap and mask, sterile gown, sterile gloves, large sterile sheet, hand hygiene and cutaneous antiseptic). Antibiotic prophylaxis was provided with g Ancef administered IV one hour prior to skin incision. Local anesthesia was attained by infiltration with 1% lidocaine with epinephrine. Ultrasound demonstrated patency of the right internal jugular vein, and this was documented with an image. Under real-time ultrasound guidance, this vein was accessed with a 21 gauge micropuncture needle and image documentation was performed. A small dermatotomy was made at the access site with an 11 scalpel. A 0.018" wire was advanced into the SVC and the access needle exchanged for a 43F micropuncture vascular sheath. The 0.018" wire was then removed and a 0.035" wire advanced into the IVC. An appropriate location for the subcutaneous  reservoir was selected below the clavicle and an incision was made through the skin and underlying soft tissues. The subcutaneous tissues were then dissected using a combination of blunt and sharp surgical technique and a pocket was formed. A lumen power injectable portacatheter was then tunneled through the subcutaneous tissues from the pocket to the dermatotomy and the port reservoir placed within the subcutaneous pocket. The venous access site was then serially dilated and a peel away vascular sheath placed over the wire. The wire was removed and the port catheter advanced into position under fluoroscopic guidance. The catheter tip is positioned in the upper right atrium. This was documented with a spot image. The portacatheter was then tested and found to flush and aspirate well. The port was flushed with saline followed by 100 units/mL heparinized saline. The pocket was then closed in two layers using first subdermal inverted interrupted absorbable sutures followed by a running subcuticular suture. The epidermis was then sealed with Dermabond. The dermatotomy at the venous access site was also sealed with Dermabond. IMPRESSION: Successful placement of a right IJ approach Power Port with ultrasound and fluoroscopic guidance. The catheter is ready for use. Electronically Signed   By: Jacqulynn Cadet M.D.   On: 07/07/2017 15:54    All questions were answered. The patient knows to call the clinic with any problems, questions or concerns. No barriers to learning was detected.  I spent 20 minutes counseling the patient face to face. The total time spent in the appointment was 25 minutes and more than 50% was on counseling and review of test results  Heath Lark, MD 07/30/2017 12:30 PM

## 2017-07-30 NOTE — Assessment & Plan Note (Signed)
The patient appears less anxious today She has declined home visits by advanced home care nursing staff I recommend outpatient follow-up next week for supportive care and she agreed with the plan of care

## 2017-07-30 NOTE — Assessment & Plan Note (Signed)
Constipation has resolved recently I recommend her to continue on chronic laxative therapy

## 2017-07-30 NOTE — Telephone Encounter (Signed)
LM for [facility] regarding referral.

## 2017-07-30 NOTE — Assessment & Plan Note (Signed)
She tolerated last treatment very poorly with significant, life-threatening side effects of treatment I recommend a trial of omission of Taxol with cycle 2 of therapy She would not need G-CSF support with cycle 2 I plan to see her back again next week for further supportive care and follow-up

## 2017-07-30 NOTE — Assessment & Plan Note (Signed)
She has significant generalized weakness and risks of falls I have refer her to cancer rehab facility for outpatient therapy

## 2017-07-30 NOTE — Telephone Encounter (Signed)
Patient scheduled 7/30 due to avail in treatment area. Gave patient avs and calendar of upcoming July and aug appts.

## 2017-07-30 NOTE — Assessment & Plan Note (Signed)
She is noted to be hypotensive and that could contribute to her weakness I recommend reducing the dose of her blood pressure medication by half and recommend she checks her blood pressure twice a day If her blood pressure remain low next week, I might recommend discontinuation of blood pressure medications altogether

## 2017-07-30 NOTE — Telephone Encounter (Signed)
-----   Message from Heath Lark, MD sent at 07/30/2017 12:32 PM EDT ----- Regarding: outpatient rehab referral Can you check status of outpatient PT referral?

## 2017-07-31 ENCOUNTER — Inpatient Hospital Stay: Payer: Medicare Other

## 2017-08-02 ENCOUNTER — Inpatient Hospital Stay: Payer: Medicare Other

## 2017-08-03 ENCOUNTER — Telehealth: Payer: Self-pay

## 2017-08-03 ENCOUNTER — Telehealth: Payer: Self-pay | Admitting: Oncology

## 2017-08-03 NOTE — Telephone Encounter (Signed)
Received incoming call from [facility] for CPT code/claim assistance for surgery on 04/15/17. Conveyed to rep that this RN worked with [employer]. Referred rep to GYN navigator for further assistance with any surgery related information. Rep verbalized understanding. Loma Sousa, RN BSN

## 2017-08-03 NOTE — Telephone Encounter (Addendum)
Received message from [place] requesting a call back at [phone] with verbal release of information from patient.  Called them back and provided diagnosis code and CPT code for surgery for her cancer policy.

## 2017-08-04 ENCOUNTER — Inpatient Hospital Stay: Payer: Medicare Other | Admitting: Hematology and Oncology

## 2017-08-10 ENCOUNTER — Telehealth: Payer: Self-pay

## 2017-08-10 NOTE — Telephone Encounter (Signed)
Called back and given below message. The funeral has already happened. She is available any day for appt, she prefers morning time between 10 am and 12 noon if possible.

## 2017-08-10 NOTE — Telephone Encounter (Signed)
When is the funeral? Is it local? I want to make sure I am not interfering with the funeral with her appt Ask for a few options, days of week, morning/afternoon and let me know

## 2017-08-10 NOTE — Telephone Encounter (Signed)
She called and left a message with scheduling for the nurse to call her.  Called back. She is crying and upset on the phone. Her sister died recently. She would like a earlier appt to see just Dr. Alvy Bimler to talk about everything and get some direction before her next chemo appt on 7/30.

## 2017-08-12 ENCOUNTER — Ambulatory Visit: Payer: Medicare Other

## 2017-08-13 ENCOUNTER — Telehealth: Payer: Self-pay | Admitting: Hematology and Oncology

## 2017-08-13 ENCOUNTER — Ambulatory Visit: Payer: Medicare Other | Admitting: Physical Therapy

## 2017-08-13 NOTE — Telephone Encounter (Signed)
Left message for patient regarding upcoming July appts per 7/16 sch message

## 2017-08-17 ENCOUNTER — Telehealth: Payer: Self-pay | Admitting: Hematology and Oncology

## 2017-08-17 ENCOUNTER — Ambulatory Visit: Payer: Medicare Other | Admitting: Hematology and Oncology

## 2017-08-17 ENCOUNTER — Telehealth: Payer: Self-pay

## 2017-08-17 NOTE — Telephone Encounter (Signed)
Called to remind her of appt today at 1030. She said she called and canceled due to diarrhea. She is afraid she will have a accident. She is taking OTC medication for diarrhea. She apolgized and will just see Dr. Alvy Bimler on 7/30. Instructed to call office if needed.

## 2017-08-17 NOTE — Telephone Encounter (Signed)
Patient called in to cancel she was already rescheduled

## 2017-08-18 ENCOUNTER — Telehealth: Payer: Self-pay | Admitting: Oncology

## 2017-08-18 ENCOUNTER — Telehealth: Payer: Self-pay | Admitting: *Deleted

## 2017-08-18 NOTE — Telephone Encounter (Addendum)
Called Katie Palmer back and advised her that Dr. Alvy Bimler recommends seeing Sandi Mealy, PA-C with symptom management today or tomorrow for her pain and not eating.  [name] said she would like to see Lucianne Lei tomorrow after 11:00.  Also discussed starting radiation and she would like to postpone it for a week after her next chemo infusion on 08/24/17.

## 2017-08-18 NOTE — Telephone Encounter (Signed)
Patient called and said she is having constant pain again where she had the fluid collections and drains.  She is scheduled to start vaginal brachytherapy tomorrow and is wondering if she needs a CT scan and if she should start radiation.  Also she said she has not been able to eat anything for the past few days.  She said everything tastes bad and makes her sick to her stomach.  She is taking her nausea medication.

## 2017-08-18 NOTE — Telephone Encounter (Signed)
Called and notified Wendell of 11:30 appointment tomorrow.  She verbalized understanding and agreement.

## 2017-08-18 NOTE — Telephone Encounter (Signed)
CALLED PATIENT TO INFORM OF [facility] [place] TO 09-02-17, SPOKE WITH PATIENT AND SHE IS AWARE OF THESE APPTS.

## 2017-08-19 ENCOUNTER — Ambulatory Visit
Admission: RE | Admit: 2017-08-19 | Discharge: 2017-08-19 | Disposition: A | Discharge status: Home / Self Care | Payer: Medicare Other | Source: Ambulatory Visit | Attending: Radiation Oncology | Admitting: Radiation Oncology

## 2017-08-19 ENCOUNTER — Ambulatory Visit: Payer: Medicare Other | Admitting: Radiation Oncology

## 2017-08-19 ENCOUNTER — Inpatient Hospital Stay (HOSPITAL_BASED_OUTPATIENT_CLINIC_OR_DEPARTMENT_OTHER): Payer: Medicare Other | Admitting: Medical

## 2017-08-19 VITALS — BP 117/82 | HR 98 | Temp 97.5°F | Resp 18 | Ht 65.0 in | Wt 188.1 lb

## 2017-08-19 DIAGNOSIS — C541 Malignant neoplasm of endometrium: Secondary | ICD-10-CM | POA: Diagnosis not present

## 2017-08-19 DIAGNOSIS — R630 Anorexia: Secondary | ICD-10-CM

## 2017-08-19 DIAGNOSIS — F331 Major depressive disorder, recurrent, moderate: Secondary | ICD-10-CM

## 2017-08-19 DIAGNOSIS — Z5111 Encounter for antineoplastic chemotherapy: Secondary | ICD-10-CM | POA: Diagnosis not present

## 2017-08-19 DIAGNOSIS — G893 Neoplasm related pain (acute) (chronic): Secondary | ICD-10-CM

## 2017-08-19 MED ORDER — DULOXETINE HCL 20 MG PO CPEP: 20.0000 mg | ORAL_CAPSULE | Freq: Two times a day (BID) | ORAL | 3 refills | Status: DC

## 2017-08-19 NOTE — Patient Instructions (Signed)
Living With Depression Everyone experiences occasional disappointment, sadness, and loss in their lives. When you are feeling down, blue, or sad for at least 2 weeks in a row, it may mean that you have depression. Depression can affect your thoughts and feelings, relationships, daily activities, and physical health. It is caused by changes in the way your brain functions. If you receive a diagnosis of depression, your health care provider will tell you which type of depression you have and what treatment options are available to you. If you are living with depression, there are ways to help you recover from it and also ways to prevent it from coming back. How to cope with lifestyle changes Coping with stress Stress is your body's reaction to life changes and events, both good and bad. Stressful situations may include:  Getting married.  The death of a spouse.  Losing a job.  Retiring.  Having a baby.  Stress can last just a few hours or it can be ongoing. Stress can play a major role in depression, so it is important to learn both how to cope with stress and how to think about it differently. Talk with your health care provider or a counselor if you would like to learn more about stress reduction. He or she may suggest some stress reduction techniques, such as:  Music therapy. This can include creating music or listening to music. Choose music that you enjoy and that inspires you.  Mindfulness-based meditation. This kind of meditation can be done while sitting or walking. It involves being aware of your normal breaths, rather than trying to control your breathing.  Centering prayer. This is a kind of meditation that involves focusing on a spiritual word or phrase. Choose a word, phrase, or sacred image that is meaningful to you and that brings you peace.  Deep breathing. To do this, expand your stomach and inhale slowly through your nose. Hold your breath for 3-5 seconds, then exhale  slowly, allowing your stomach muscles to relax.  Muscle relaxation. This involves intentionally tensing muscles then relaxing them.  Choose a stress reduction technique that fits your lifestyle and personality. Stress reduction techniques take time and practice to develop. Set aside 5-15 minutes a day to do them. Therapists can offer training in these techniques. The training may be covered by some insurance plans. Other things you can do to manage stress include:  Keeping a stress diary. This can help you learn what triggers your stress and ways to control your response.  Understanding what your limits are and saying no to requests or events that lead to a schedule that is too full.  Thinking about how you respond to certain situations. You may not be able to control everything, but you can control how you react.  Adding humor to your life by watching funny films or TV shows.  Making time for activities that help you relax and not feeling guilty about spending your time this way.  Medicines Your health care provider may suggest certain medicines if he or she feels that they will help improve your condition. Avoid using alcohol and other substances that may prevent your medicines from working properly (may interact). It is also important to:  Talk with your pharmacist or health care provider about all the medicines that you take, their possible side effects, and what medicines are safe to take together.  Make it your goal to take part in all treatment decisions (shared decision-making). This includes giving input on the side   effects of medicines. It is best if shared decision-making with your health care provider is part of your total treatment plan.  If your health care provider prescribes a medicine, you may not notice the full benefits of it for 4-8 weeks. Most people who are treated for depression need to be on medicine for at least 6-12 months after they feel better. If you are taking  medicines as part of your treatment, do not stop taking medicines without first talking to your health care provider. You may need to have the medicine slowly decreased (tapered) over time to decrease the risk of harmful side effects. Relationships Your health care provider may suggest family therapy along with individual therapy and drug therapy. While there may not be family problems that are causing you to feel depressed, it is still important to make sure your family learns as much as they can about your mental health. Having your family's support can help make your treatment successful. How to recognize changes in your condition Everyone has a different response to treatment for depression. Recovery from major depression happens when you have not had signs of major depression for two months. This may mean that you will start to:  Have more interest in doing activities.  Feel less hopeless than you did 2 months ago.  Have more energy.  Overeat less often, or have better or improving appetite.  Have better concentration.  Your health care provider will work with you to decide the next steps in your recovery. It is also important to recognize when your condition is getting worse. Watch for these signs:  Having fatigue or low energy.  Eating too much or too little.  Sleeping too much or too little.  Feeling restless, agitated, or hopeless.  Having trouble concentrating or making decisions.  Having unexplained physical complaints.  Feeling irritable, angry, or aggressive.  Get help as soon as you or your family members notice these symptoms coming back. How to get support and help from others How to talk with friends and family members about your condition Talking to friends and family members about your condition can provide you with one way to get support and guidance. Reach out to trusted friends or family members, explain your symptoms to them, and let them know that you are  working with a health care provider to treat your depression. Financial resources Not all insurance plans cover mental health care, so it is important to check with your insurance carrier. If paying for co-pays or counseling services is a problem, search for a local or county mental health care center. They may be able to offer public mental health care services at low or no cost when you are not able to see a private health care provider. If you are taking medicine for depression, you may be able to get the generic form, which may be less expensive. Some makers of prescription medicines also offer help to patients who cannot afford the medicines they need. Follow these instructions at home:  Get the right amount and quality of sleep.  Cut down on using caffeine, tobacco, alcohol, and other potentially harmful substances.  Try to exercise, such as walking or lifting small weights.  Take over-the-counter and prescription medicines only as told by your health care provider.  Eat a healthy diet that includes plenty of vegetables, fruits, whole grains, low-fat dairy products, and lean protein. Do not eat a lot of foods that are high in solid fats, added sugars, or salt.    Keep all follow-up visits as told by your health care provider. This is important. Contact a health care provider if:  You stop taking your antidepressant medicines, and you have any of these symptoms: ? Nausea. ? Headache. ? Feeling lightheaded. ? Chills and body aches. ? Not being able to sleep (insomnia).  You or your friends and family think your depression is getting worse. Get help right away if:  You have thoughts of hurting yourself or others. If you ever feel like you may hurt yourself or others, or have thoughts about taking your own life, get help right away. You can go to your nearest emergency department or call:  Your local emergency services (911 in the [place]  A suicide crisis helpline, such as the  National Suicide Prevention Lifeline at 1-800-273-8255. This is open 24-hours a day.  Summary  If you are living with depression, there are ways to help you recover from it and also ways to prevent it from coming back.  Work with your health care team to create a management plan that includes counseling, stress management techniques, and healthy lifestyle habits. This information is not intended to replace advice given to you by your health care provider. Make sure you discuss any questions you have with your health care provider. Document Released: 12/16/2015 Document Revised: 12/16/2015 Document Reviewed: 12/16/2015 Elsevier Interactive Patient Education  2018 Elsevier Inc.  

## 2017-08-19 NOTE — Progress Notes (Signed)
Pt reports increasing depression without SI.  PA Lucianne Lei aware.  Pt has an appt with her psychiatrist next week.

## 2017-08-20 ENCOUNTER — Ambulatory Visit: Payer: Medicare Other | Admitting: Rehabilitation

## 2017-08-20 NOTE — Progress Notes (Signed)
[facility]pt[facility] Progress Note   Katie Palmer [id number] [birth year] 67 y.o.  Katie Palmer is managed by Dr. Heath Lark  Actively treated with chemotherapy/immunotherapy: yes  Current Therapy: Carboplatin  Last Treated: 07/30/2017 (cycle 2, day 1)  Assessment: Plan:    Anorexia  Moderate episode of recurrent major depressive disorder ([place]) - Plan: DULoxetine (CYMBALTA) 20 MG capsule  Neoplasm related pain - Plan: DULoxetine (CYMBALTA) 20 MG capsule  Endometrial adenocarcinoma ([place])   Anorexia: I have spoken with Ernestene Kiel who is our nutritionist.  I suggested to the patient that she might see Ms. Neff at a future appointment.  I also suggested to the patient that she might purchase Glucerna as a supplement to her diet.  Moderate, recurrent depression neoplasm related pain: The patient was given a prescription for Cymbalta 20 mg with instructions to use this once daily for 2 weeks then increase to twice daily dosing.  I am hopeful that this choice will help for treatment of her depression but will also will help to augment the benefits of pain control with tramadol.  The only issue of concern is the risk of serotonin syndrome with Cymbalta and tramadol when taken together.  Because of this, I have chosen to start Cymbalta at a lower dose of 20 mg once daily.  I will be in contact with her next week to see how she is doing.  Endometrial adenocarcinoma: The patient is status post cycle 2, day 1 of carboplatin which was dosed on 07/30/2017.  Please see After Visit Summary for patient specific instructions.  Future Appointments  Date Time Provider [place]  08/24/2017 10:30 AM [facility] LAB 6 [facility] None  08/24/2017 10:45 AM [facility] [place] FLUSH [facility] None  08/24/2017 11:15 AM Gorsuch, Ni, MD [facility] None  08/24/2017 12:15 PM [facility] INFUSION [facility] None  09/02/2017  8:00 AM [facility] NURSE [facility] None  09/02/2017  8:30 AM Gery Pray, MD [facility] None  09/02/2017  9:00 AM Gery Pray, MD [facility] None  09/02/2017  1:00 PM Gery Pray, MD [facility] None  09/13/2017 10:30 AM [facility] LAB ONLY [facility] None  09/13/2017 10:45 AM [facility] [place] FLUSH [facility] None  09/13/2017 11:15 AM Heath Lark, MD [facility] None  09/13/2017 12:15 PM [facility] INFUSION [facility] None  09/16/2017  1:00 PM Gery Pray, MD [facility] None  09/23/2017 11:00 AM Gery Pray, MD [place] None  09/30/2017  9:00 AM Gery Pray, MD [facility] None  10/07/2017 11:00 AM Gery Pray, MD [facility] None  12/09/2017 11:20 AM [facility] MM 2 [facility]    No orders of the defined types were placed in this encounter.      Subjective:   Patient ID:  Katie Palmer is a 67 y.o. (DOB [birth year]) female.  Chief Complaint:  Chief Complaint  Patient presents with  . Abdominal Pain    HPI Katie Palmer is a 67 year old female with a history of an endometrial cancer who is managed by Dr. Heath Lark and is status post cycle 2, day 1 of carboplatin which was dosed on 07/30/2017.  She presents to the clinic today with continued lower abdominal and pelvic pain despite her use of tramadol every 4 hours.  She is also reporting anorexia although she is drinking fluids well.  She is having depression and is not managing well with her diagnosis.  She reports today that she does feel like giving up at times and wishes that "this was all over.".  She denies suicidal or  homicidal ideations.  She has no plans to harm herself.  She reports weakness, fatigue, nausea and has a history of chronic vertigo.  She denies fevers, chills, shortness of breath, chest pain, vomiting, or diarrhea.  Medications: I have reviewed the patient's current medications.  Allergies:  Allergies  Allergen Reactions  . Armodafinil Other (See Comments)    Flu-like symptoms  . Invokana [Canagliflozin] Other (See Comments)    "shut down pancreas"   . Lasix  [Furosemide] Diarrhea    Severe diarrhea  . Adhesive [Tape] Itching and Rash    Pt has not tried paper tape    Past Medical History:  Diagnosis Date  . Depression   . Fibromyalgia   . Headache(784.0)   . History of colon polyps   . History of diabetes with ketoacidosis   . Hx of colonic polyps   . Hypertension   . Insomnia   . Insulin pump in place   . RBBB (right bundle branch block)   . RLQ abdominal pain   . Thickened endometrium   . Type 1 diabetes mellitus with long-term current use of insulin [facility]) endocrinologist-- dr Chalmers Cater   July 2015--  pt took Invokana and caused complete pancreas shut-down , pt went from pre-diabetic to adult type 1 dm now insulin dependent  . Vertigo     Past Surgical History:  Procedure Laterality Date  . CARDIOVASCULAR STRESS TEST  07/26/2015   normal myocardial perfusion study w/ no ischemia/  normal LV function and wall motion , ef 71%  . COLONOSCOPY WITH PROPOFOL  last one 10/ 2017  . HYSTEROSCOPY W/D&C N/A 12/25/2015   Procedure: DILATATION AND CURETTAGE /HYSTEROSCOPY;  Surgeon: Bobbye Charleston, MD;  Location: [facility];  Service: Gynecology;  Laterality: N/A;  . IR IMAGING GUIDED PORT INSERTION  07/07/2017  . IR RADIOLOGIST EVAL & MGMT  05/06/2017  . IR RADIOLOGIST EVAL & MGMT  05/18/2017  . IR RADIOLOGIST EVAL & MGMT  05/13/2017  . IR RADIOLOGIST EVAL & MGMT  06/03/2017  . IR RADIOLOGIST EVAL & MGMT  06/09/2017  . IR RADIOLOGIST EVAL & MGMT  06/24/2017  . LAPAROSCOPIC LYSIS OF ADHESIONS N/A 12/25/2015   Procedure: LAPAROSCOPIC LYSIS OF ADHESIONS;  Surgeon: Bobbye Charleston, MD;  Location: [place];  Service: Gynecology;  Laterality: N/A;  . LAPAROSCOPIC UNILATERAL SALPINGO OOPHERECTOMY Right 12/25/2015   Procedure: LAPAROSCOPIC UNILATERAL SALPINGO OOPHORECTOMY;  Surgeon: Bobbye Charleston, MD;  Location: [place];  Service: Gynecology;  Laterality: Right;  . LAPAROSCOPY N/A 12/25/2015    Procedure: LAPAROSCOPY OPERATIVE;  Surgeon: Bobbye Charleston, MD;  Location: [facility];  Service: Gynecology;  Laterality: N/A;  . LYMPH NODE BIOPSY N/A 04/15/2017   Procedure: SENTINEL LYMPH NODE BIOPSY, PERIAORTIC LYMPH NODES, AND VAGINAL BIOPSY;  Surgeon: Everitt Amber, MD;  Location: [facility];  Service: Gynecology;  Laterality: N/A;  . ROBOTIC ASSISTED TOTAL HYSTERECTOMY WITH BILATERAL SALPINGO OOPHERECTOMY Bilateral 04/15/2017   Procedure: XI ROBOTIC ASSISTED TOTAL LAPROSCOPIC  HYSTERECTOMY WITH LEFT SALPINGO OOPHORECTOMY;  Surgeon: Everitt Amber, MD;  Location: [facility];  Service: Gynecology;  Laterality: Bilateral;  . TONSILLECTOMY  age 48  . TRANSTHORACIC ECHOCARDIOGRAM  07/26/2015   grade 1 diastolic dysfunction , ef 60-65%/  trivial TR    Family History  Problem Relation Age of Onset  . Alcohol abuse Father        Died age 11  . Heart disease Father   . Aneurysm Mother  Died age 19  . Leukemia Brother        Died age 69  . COPD Sister   . Rheum arthritis Sister   . Heart failure Sister   . Dementia Maternal Uncle   . Colon cancer Neg Hx     Social History   Socioeconomic History  . Marital status: Divorced    Spouse name: Not on file  . Number of children: 1  . Years of education: Not on file  . Highest education level: Not on file  Occupational History  . 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 Financial resource strain: Not on file  . Food insecurity:    Worry: Not on file    Inability: Not on file  . Transportation needs:    Medical: Not on file    Non-medical: Not on file  Tobacco Use  . Smoking status: Never Smoker  . Smokeless tobacco: Never Used  Substance and Sexual Activity  . Alcohol use: No    Alcohol/week: 0.0 oz  . Drug use: No  . Sexual activity: Not Currently    Birth control/protection: Post-menopausal  Lifestyle  . Physical activity:    Days per week: Not on file    Minutes per session: Not on file  . Stress: Not  on file  Relationships  . Social connections:    Talks on phone: Not on file    Gets together: Not on file    Attends religious service: Not on file    Active member of club or organization: Not on file    Attends meetings of clubs or organizations: Not on file    Relationship status: Not on file  . Intimate partner violence:    Fear of current or ex partner: Not on file    Emotionally abused: Not on file    Physically abused: Not on file    Forced sexual activity: Not on file  Other Topics Concern  . Not on file  Social History Narrative   Divorced.  Son lives with her.  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    Past Medical History, Surgical history, Social history, and Family history were reviewed and updated as appropriate.   Please see review of systems for further details on the patient's review from today.   Review of Systems:  Review of Systems  Constitutional: Positive for appetite change and fatigue. Negative for activity change, chills, diaphoresis and fever.  HENT: Negative for mouth sores and trouble swallowing.   Respiratory: Negative for cough, chest tightness and shortness of breath.   Cardiovascular: Negative for chest pain, palpitations and leg swelling.  Gastrointestinal: Positive for abdominal pain and nausea. Negative for abdominal distention, constipation, diarrhea and vomiting.  Neurological: Positive for weakness.       Chronic vertigo  Psychiatric/Behavioral: Positive for dysphoric mood. Negative for self-injury and suicidal ideas. The patient is nervous/anxious.     Objective:   Physical Exam:  BP 117/82 (BP Location: Left Arm, Patient Position: Sitting)   Pulse 98   Temp (!) 97.5 F (36.4 C) (Oral)   Resp 18   Ht 5\' 5"  (1.651 m)   Wt 188 lb 1.6 oz (85.3 kg)   SpO2 96%   BMI 31.30 kg/m  ECOG: 1  Physical Exam  Constitutional: No distress.  HENT:  Head: Normocephalic and atraumatic.  Mouth/Throat: No oropharyngeal  exudate.  Neck: Normal range of motion. Neck supple.  Cardiovascular: Normal rate, regular rhythm  and normal heart sounds. Exam reveals no gallop and no friction rub.  No murmur heard. Pulmonary/Chest: Effort normal and breath sounds normal. No respiratory distress. She has no wheezes. She has no rales.  Lymphadenopathy:    She has no cervical adenopathy.  Neurological: She is alert. Coordination normal.  The patient is ambulating with the use of a wheelchair.  Skin: Skin is warm and dry. No rash noted. She is not diaphoretic. No erythema.  Psychiatric: Judgment and thought content normal.  The patient is intermittently tearful and appears sad.    Lab Review:     Component Value Date/Time   NA 142 07/30/2017 0927   K 3.7 07/30/2017 0927   CL 99 07/30/2017 0927   CO2 33 (H) 07/30/2017 0927   GLUCOSE 233 (H) 07/30/2017 0927   BUN 10 07/30/2017 0927   CREATININE 0.87 07/30/2017 0927   CREATININE 1.32 (H) 07/12/2017 1232   CALCIUM 9.2 07/30/2017 0927   PROT 6.4 (L) 07/30/2017 0927   ALBUMIN 2.9 (L) 07/30/2017 0927   AST 15 07/30/2017 0927   AST 19 07/12/2017 1232   ALT 11 07/30/2017 0927   ALT 13 07/12/2017 1232   ALKPHOS 91 07/30/2017 0927   BILITOT 0.5 07/30/2017 0927   BILITOT 1.0 07/12/2017 1232   GFRNONAA >60 07/30/2017 0927   GFRNONAA 40 (L) 07/12/2017 1232   GFRAA >60 07/30/2017 0927   GFRAA 47 (L) 07/12/2017 1232       Component Value Date/Time   WBC 5.7 07/30/2017 0924   WBC 9.3 07/07/2017 1248   RBC 3.68 (L) 07/30/2017 0924   HGB 10.4 (L) 07/30/2017 0924   HCT 30.8 (L) 07/30/2017 0924   PLT 163 07/30/2017 0924   MCV 83.6 07/30/2017 0924   MCH 28.2 07/30/2017 0924   MCHC 33.8 07/30/2017 0924   RDW 15.9 (H) 07/30/2017 0924   LYMPHSABS 1.1 07/30/2017 0924   MONOABS 0.4 07/30/2017 0924   EOSABS 0.0 07/30/2017 0924   BASOSABS 0.0 07/30/2017 0924   -------------------------------  Imaging from last 24 hours (if applicable):  Radiology interpretation: No  results found.

## 2017-08-24 ENCOUNTER — Inpatient Hospital Stay: Payer: Medicare Other

## 2017-08-24 ENCOUNTER — Telehealth: Payer: Self-pay | Admitting: *Deleted

## 2017-08-24 ENCOUNTER — Inpatient Hospital Stay (HOSPITAL_BASED_OUTPATIENT_CLINIC_OR_DEPARTMENT_OTHER): Payer: Medicare Other | Admitting: Hematology and Oncology

## 2017-08-24 VITALS — BP 111/70 | HR 78 | Temp 98.7°F | Resp 18

## 2017-08-24 VITALS — BP 96/59 | HR 102 | Temp 97.8°F | Resp 18 | Ht 65.0 in | Wt 187.1 lb

## 2017-08-24 DIAGNOSIS — C541 Malignant neoplasm of endometrium: Secondary | ICD-10-CM

## 2017-08-24 DIAGNOSIS — I952 Hypotension due to drugs: Secondary | ICD-10-CM

## 2017-08-24 DIAGNOSIS — R531 Weakness: Secondary | ICD-10-CM | POA: Diagnosis not present

## 2017-08-24 DIAGNOSIS — Z5111 Encounter for antineoplastic chemotherapy: Secondary | ICD-10-CM | POA: Diagnosis not present

## 2017-08-24 DIAGNOSIS — IMO0001 Reserved for inherently not codable concepts without codable children: Secondary | ICD-10-CM

## 2017-08-24 DIAGNOSIS — E860 Dehydration: Secondary | ICD-10-CM

## 2017-08-24 DIAGNOSIS — R112 Nausea with vomiting, unspecified: Secondary | ICD-10-CM

## 2017-08-24 DIAGNOSIS — Z794 Long term (current) use of insulin: Secondary | ICD-10-CM

## 2017-08-24 DIAGNOSIS — E1139 Type 2 diabetes mellitus with other diabetic ophthalmic complication: Secondary | ICD-10-CM

## 2017-08-24 LAB — CBC WITH DIFFERENTIAL (CANCER CENTER ONLY)
BASOS ABS: 0 10*3/uL (ref 0.0–0.1)
BASOS PCT: 0 %
EOS ABS: 0 10*3/uL (ref 0.0–0.5)
Eosinophils Relative: 0 %
HCT: 29.8 % — ABNORMAL LOW (ref 34.8–46.6)
HEMOGLOBIN: 9.9 g/dL — AB (ref 11.6–15.9)
Lymphocytes Relative: 18 %
Lymphs Abs: 1.2 10*3/uL (ref 0.9–3.3)
MCH: 29.2 pg (ref 25.1–34.0)
MCHC: 33.2 g/dL (ref 31.5–36.0)
MCV: 87.9 fL (ref 79.5–101.0)
MONOS PCT: 8 %
Monocytes Absolute: 0.5 10*3/uL (ref 0.1–0.9)
Neutro Abs: 5.1 10*3/uL (ref 1.5–6.5)
Neutrophils Relative %: 74 %
Platelet Count: 195 10*3/uL (ref 145–400)
RBC: 3.39 MIL/uL — ABNORMAL LOW (ref 3.70–5.45)
RDW: 20.2 % — AB (ref 11.2–14.5)
WBC Count: 6.8 10*3/uL (ref 3.9–10.3)

## 2017-08-24 LAB — CMP (CANCER CENTER ONLY)
ALBUMIN: 2.9 g/dL — AB (ref 3.5–5.0)
ALK PHOS: 102 U/L (ref 38–126)
AST: 10 U/L — AB (ref 15–41)
Anion gap: 13 (ref 5–15)
BILIRUBIN TOTAL: 0.5 mg/dL (ref 0.3–1.2)
BUN: 15 mg/dL (ref 8–23)
CALCIUM: 9.4 mg/dL (ref 8.9–10.3)
CO2: 31 mmol/L (ref 22–32)
CREATININE: 1.1 mg/dL — AB (ref 0.44–1.00)
Chloride: 92 mmol/L — ABNORMAL LOW (ref 98–111)
GFR, EST AFRICAN AMERICAN: 58 mL/min — AB (ref >60)
GFR, EST NON AFRICAN AMERICAN: 50 mL/min — AB (ref >60)
Glucose, Bld: 202 mg/dL — ABNORMAL HIGH (ref 70–99)
Potassium: 3.1 mmol/L — ABNORMAL LOW (ref 3.5–5.1)
SODIUM: 136 mmol/L (ref 135–145)
TOTAL PROTEIN: 7 g/dL (ref 6.5–8.1)

## 2017-08-24 MED ORDER — SODIUM CHLORIDE 0.9% FLUSH
10.0000 mL | Freq: Once | INTRAVENOUS | Status: AC
  Administered 2017-08-24: 10 mL
  Filled 2017-08-24: qty 10

## 2017-08-24 MED ORDER — SODIUM CHLORIDE 0.9 % IV SOLN
Freq: Once | INTRAVENOUS | Status: AC
  Administered 2017-08-24: 16:00:00 via INTRAVENOUS
  Filled 2017-08-24: qty 250

## 2017-08-24 MED ORDER — HEPARIN SOD (PORK) LOCK FLUSH 100 UNIT/ML IV SOLN
500.0000 [IU] | Freq: Once | INTRAVENOUS | Status: AC
  Administered 2017-08-24: 500 [IU]
  Filled 2017-08-24: qty 5

## 2017-08-24 MED ORDER — PROMETHAZINE HCL 25 MG/ML IJ SOLN
INTRAMUSCULAR | Status: AC
  Filled 2017-08-24: qty 1

## 2017-08-24 MED ORDER — PROMETHAZINE HCL 25 MG/ML IJ SOLN
25.0000 mg | Freq: Once | INTRAMUSCULAR | Status: AC
  Administered 2017-08-24: 25 mg via INTRAVENOUS

## 2017-08-24 NOTE — Telephone Encounter (Signed)
Pt left a message stating she has been throwing up since 0600 and cannot come in for treatment today.  RN called patient back and encouraged her to come in for labs and see Dr Alvy Bimler. Discussed possibility of IVF and IV zofran. Pt states she will come in today. Does not know what caused her vomiting- only ate mashed potatoes yesterday.

## 2017-08-24 NOTE — Progress Notes (Addendum)
Nutrition Assessment   Reason for Assessment:   Weight loss, nausea  ASSESSMENT:  67 year old female with endometrial cancer.  Patient followed by Dr. Alvy Bimler.  Past medical history of type 1 DM using insulin pump, fibromyalgia, HTN, depression.    Met with patient during IV fluids today.  Patient chemo treatment is on hold.  Having issues with nausea and vomiting. Reports appetite has been decreased for the past 3 weeks or more.  Only ate mashed potatoes yesterday, drinking water today.  Has not tried oral nutrition supplements.    Nutrition Focused Physical Exam: deferred  Medications: zofran, compazine, senokot  Labs: glucose 233  Anthropometrics:   Height: 65 inches Weight: 187 lb UBW: 218 (05/26/17) BMI: 31  14% weight loss in the last 3 months, significant  Estimated Energy Needs  Kcals: 1484-0397 calories/d Protein: 86-114 g/d Fluid: 2.1  NUTRITION DIAGNOSIS: Inadequate oral intake related to cancer and cancer related treatment side effects as evidenced by 14% weight loss in 3 months and eating less than 75% energy needs for > or equal to 1 month.     MALNUTRITION DIAGNOSIS: Patient meets criteria for severe malnutrition in context of chronic illness as evidenced by 14% weight loss in 3 months and eating< 75% energy needs for > or equal to 1 month   INTERVENTION:  Discussed importance of taking nausea medication to help relieve symptoms.  Discussed small frequent meals. Discussed strategies to help with nausea and vomiting.  Handout provided Discussed clear liquid oral nutrition supplement shakes to try on days when nauseated as sometimes better tolerated than milky ones.  Patient aware these have higher carbohydrate amount and will need to dose insulin accordingly.   Also discussed ensure rapid hydration and pedilalyte as options for patient to try Contact information provided    MONITORING, EVALUATION, GOAL: weight trends, intake   NEXT VISIT: Thursday,   August 22 after radiation therapy  Dynastee Brummell B. Zenia Resides, [place], [place] Registered Dietitian [phone] (pager)

## 2017-08-24 NOTE — Patient Instructions (Signed)
Dehydration, Adult Dehydration is a condition in which there is not enough fluid or water in the body. This happens when you lose more fluids than you take in. Important organs, such as the kidneys, brain, and heart, cannot function without a proper amount of fluids. Any loss of fluids from the body can lead to dehydration. Dehydration can range from mild to severe. This condition should be treated right away to prevent it from becoming severe. What are the causes? This condition may be caused by:  Vomiting.  Diarrhea.  Excessive sweating, such as from heat exposure or exercise.  Not drinking enough fluid, especially: ? When ill. ? While doing activity that requires a lot of energy.  Excessive urination.  Fever.  Infection.  Certain medicines, such as medicines that cause the body to lose excess fluid (diuretics).  Inability to access safe drinking water.  Reduced physical ability to get adequate water and food.  What increases the risk? This condition is more likely to develop in people:  Who have a poorly controlled long-term (chronic) illness, such as diabetes, heart disease, or kidney disease.  Who are age 65 or older.  Who are disabled.  Who live in a place with high altitude.  Who play endurance sports.  What are the signs or symptoms? Symptoms of mild dehydration may include:  Thirst.  Dry lips.  Slightly dry mouth.  Dry, warm skin.  Dizziness. Symptoms of moderate dehydration may include:  Very dry mouth.  Muscle cramps.  Dark urine. Urine may be the color of tea.  Decreased urine production.  Decreased tear production.  Heartbeat that is irregular or faster than normal (palpitations).  Headache.  Light-headedness, especially when you stand up from a sitting position.  Fainting (syncope). Symptoms of severe dehydration may include:  Changes in skin, such as: ? Cold and clammy skin. ? Blotchy (mottled) or pale skin. ? Skin that does  not quickly return to normal after being lightly pinched and released (poor skin turgor).  Changes in body fluids, such as: ? Extreme thirst. ? No tear production. ? Inability to sweat when body temperature is high, such as in hot weather. ? Very little urine production.  Changes in vital signs, such as: ? Weak pulse. ? Pulse that is more than 100 beats a minute when sitting still. ? Rapid breathing. ? Low blood pressure.  Other changes, such as: ? Sunken eyes. ? Cold hands and feet. ? Confusion. ? Lack of energy (lethargy). ? Difficulty waking up from sleep. ? Short-term weight loss. ? Unconsciousness. How is this diagnosed? This condition is diagnosed based on your symptoms and a physical exam. Blood and urine tests may be done to help confirm the diagnosis. How is this treated? Treatment for this condition depends on the severity. Mild or moderate dehydration can often be treated at home. Treatment should be started right away. Do not wait until dehydration becomes severe. Severe dehydration is an emergency and it needs to be treated in a hospital. Treatment for mild dehydration may include:  Drinking more fluids.  Replacing salts and minerals in your blood (electrolytes) that you may have lost. Treatment for moderate dehydration may include:  Drinking an oral rehydration solution (ORS). This is a drink that helps you replace fluids and electrolytes (rehydrate). It can be found at pharmacies and retail stores. Treatment for severe dehydration may include:  Receiving fluids through an IV tube.  Receiving an electrolyte solution through a feeding tube that is passed through your nose   and into your stomach (nasogastric tube, or NG tube).  Correcting any abnormalities in electrolytes.  Treating the underlying cause of dehydration. Follow these instructions at home:  If directed by your health care provider, drink an ORS: ? Make an ORS by following instructions on the  package. ? Start by drinking small amounts, about  cup (120 mL) every 5-10 minutes. ? Slowly increase how much you drink until you have taken the amount recommended by your health care provider.  Drink enough clear fluid to keep your urine clear or pale yellow. If you were told to drink an ORS, finish the ORS first, then start slowly drinking other clear fluids. Drink fluids such as: ? Water. Do not drink only water. Doing that can lead to having too little salt (sodium) in the body (hyponatremia). ? Ice chips. ? Fruit juice that you have added water to (diluted fruit juice). ? Low-calorie sports drinks.  Avoid: ? Alcohol. ? Drinks that contain a lot of sugar. These include high-calorie sports drinks, fruit juice that is not diluted, and soda. ? Caffeine. ? Foods that are greasy or contain a lot of fat or sugar.  Take over-the-counter and prescription medicines only as told by your health care provider.  Do not take sodium tablets. This can lead to having too much sodium in the body (hypernatremia).  Eat foods that contain a healthy balance of electrolytes, such as bananas, oranges, potatoes, tomatoes, and spinach.  Keep all follow-up visits as told by your health care provider. This is important. Contact a health care provider if:  You have abdominal pain that: ? Gets worse. ? Stays in one area (localizes).  You have a rash.  You have a stiff neck.  You are more irritable than usual.  You are sleepier or more difficult to wake up than usual.  You feel weak or dizzy.  You feel very thirsty.  You have urinated only a small amount of very dark urine over 6-8 hours. Get help right away if:  You have symptoms of severe dehydration.  You cannot drink fluids without vomiting.  Your symptoms get worse with treatment.  You have a fever.  You have a severe headache.  You have vomiting or diarrhea that: ? Gets worse. ? Does not go away.  You have blood or green matter  (bile) in your vomit.  You have blood in your stool. This may cause stool to look black and tarry.  You have not urinated in 6-8 hours.  You faint.  Your heart rate while sitting still is over 100 beats a minute.  You have trouble breathing. This information is not intended to replace advice given to you by your health care provider. Make sure you discuss any questions you have with your health care provider. Document Released: 01/12/2005 Document Revised: 08/09/2015 Document Reviewed: 03/08/2015 [employer]vier Interactive Patient Education  2018 [employer]  

## 2017-08-25 ENCOUNTER — Ambulatory Visit: Payer: Medicare Other

## 2017-08-25 ENCOUNTER — Other Ambulatory Visit: Payer: Self-pay | Admitting: Hematology and Oncology

## 2017-08-25 DIAGNOSIS — C541 Malignant neoplasm of endometrium: Secondary | ICD-10-CM

## 2017-08-25 MED ORDER — PROMETHAZINE HCL 25 MG PO TABS: 25.0000 mg | ORAL_TABLET | Freq: Four times a day (QID) | ORAL | 0 refills | Status: AC | PRN

## 2017-08-26 ENCOUNTER — Encounter: Payer: Self-pay | Admitting: Hematology and Oncology

## 2017-08-26 ENCOUNTER — Ambulatory Visit: Payer: Medicare Other | Admitting: Radiation Oncology

## 2017-08-26 DIAGNOSIS — R112 Nausea with vomiting, unspecified: Secondary | ICD-10-CM | POA: Insufficient documentation

## 2017-08-26 NOTE — Assessment & Plan Note (Signed)
She tolerated chemotherapy very poorly with exaggerated side effects including uncontrolled nausea, vomiting, dehydration, near syncopal episode and others I plan to put her chemotherapy on hold and focus on supportive measures only I will consult advanced home care agency to check on the patient weekly and administer IV fluids and IV antiemetics as needed She will proceed with radiation therapy as scheduled I plan to see her back next month for further evaluation and supportive care

## 2017-08-26 NOTE — Assessment & Plan Note (Signed)
She has uncontrolled diabetes with each dose of chemotherapy despite medication adjustment For now, I recommend she continues close follow-up with her endocrinologist for management I reinforced the importance of aggressive fluid intake

## 2017-08-26 NOTE — Assessment & Plan Note (Signed)
She has profound generalized weakness due to dehydration and poor oral intake Due to her debility and immunocompromise state, we will provide services through advanced home care agency In the past, the patient was resistant to that suggestion but now is open to getting help at home

## 2017-08-26 NOTE — Progress Notes (Signed)
[facility] OFFICE PROGRESS NOTE  Patient Care Team: Laurey Morale, MD as PCP - General Jacelyn Pi, MD as Consulting Physician (Endocrinology)  ASSESSMENT & PLAN:  Endometrial cancer [facility]) She tolerated chemotherapy very poorly with exaggerated side effects including uncontrolled nausea, vomiting, dehydration, near syncopal episode and others I plan to put her chemotherapy on hold and focus on supportive measures only I will consult advanced home care agency to check on the patient weekly and administer IV fluids and IV antiemetics as needed She will proceed with radiation therapy as scheduled I plan to see her back next month for further evaluation and supportive care   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 ([place]) She has uncontrolled diabetes with each dose of chemotherapy despite medication adjustment For now, I recommend she continues close follow-up with her endocrinologist for management I reinforced the importance of aggressive fluid intake  Dehydration She had recent profound weight loss secondary to dehydration due to nausea from treatment I recommend IV fluid support at home through advanced home care agency daily If she is able to adequately hydrate herself, we can space out the appointment  Nausea with vomiting She had recent uncontrolled nausea vomiting She felt that she tolerated Phenergan well We will continue Phenergan as needed along with IV fluid support and IV antiemetics as needed  Generalized weakness She has profound generalized weakness due to dehydration and poor oral intake Due to her debility and immunocompromise state, we will provide services through advanced home care agency In the past, the patient was resistant to that suggestion but now is open to getting help at home   Orders Placed This Encounter  Procedures  . Ambulatory referral to Home Health    Referral Priority:   Routine    Referral Type:    Home Health Care    Referral Reason:   Specialty Services Required    Requested Specialty:   [place]    Number of Visits Requested:   1    INTERVAL HISTORY: Please see below for problem oriented charting. She returns with her son for further follow-up She is late for her appointment The patient had called the office several times for various different issues in recent weeks She was grieving over the death of her sister She had call to speak with me and requested an appointment last week but did not show up When we call and check on the patient, she stated she had profound diarrhea and did not make it out of the house for her appointment Today, she called to cancel her appointment because of feeling unwell.  Ultimately, after several conversations with my nursing staff, the patient eventually show up for appointment She has lost almost 13 pounds over her the period of 2 weeks due to uncontrolled nausea, vomiting and poor oral intake Her blood sugar was also poorly controlled She had near syncopal episode at home She insists that she took some antiemetics but it was not helping Previously, she told my nursing staff she have severe abdominal pain but she denies abdominal pain today  SUMMARY OF ONCOLOGIC HISTORY: Oncology History   High grade carcinosarcoma     Endometrial cancer ([place])   12/25/2015 Pathology Results    1. Ovary and fallopian tube, right - BENIGN OVARIAN FIBROTHECOMA. - NO ENDOMETRIOSIS OR MALIGNANCY. - BENIGN FALLOPIAN TUBE WITH BENIGN PARATUBAL CYST. 2. Endometrium, curettage - INACTIVE ENDOMETRIUM, BENIGN ENDOMETRIAL POLYP AND BENIGN SQUAMOUS FRAGMENTS. - NO HYPERPLASIA OR MALIGNANCY  12/25/2015 Surgery    PRE-OPERATIVE DIAGNOSIS:  RLQ PAIN, thickened endometrium  POST-OPERATIVE DIAGNOSIS:  R O lesion, adhesions, polyp in uterus  PROCEDURE:  Procedure(s): DILATATION AND CURETTAGE /HYSTEROSCOPY (N/A) LAPAROSCOPY OPERATIVE  (N/A) LAPAROSCOPIC LYSIS OF ADHESIONS (N/A) LAPAROSCOPIC UNILATERAL SALPINGO OOPHORECTOMY (Right        01/25/2017 Initial Diagnosis    The patient reports postmenopausal bleeding since December, 2018. Pap smear showed AGUS.         03/17/2017 Pathology Results    Endometrial biopsy on 03/17/17 showed grade 3 endometrial adenocarcinoma.      03/26/2017 Imaging    Ct abdomen Diffuse endometrial thickening consistent with endometrial carcinoma, with probable focal area of deep myometrial invasion in the anterior fundus.  No evidence of extra uterine tumor extension, lymphadenopathy, or distant metastatic disease.  Two tiny bilateral lower lobe pulmonary nodules, largest measuring 5 mm. These are indeterminate, but likely benign. Recommend follow-up with chest CT without contrast in 3-6 months.      04/15/2017 Surgery    Operation: Robotic-assisted laparoscopic total hysterectomy with left salpingoophorectomy, SLN biopsy, para-aortic lymphadenectomy (bilateral), vaginal biopsy.  Surgeon: Donaciano Eva  Operative Findings:  : 58mm nodule on left proximal (upper) vaginal side wall (benign on frozen). 8cm normal appearing uterus, surgically absent right ovary and tube, normal left ovary somewhat adherent to round ligament. Indurated left external iliac SLN. No other suspicious nodes or apparent extrauterine disease        04/15/2017 Pathology Results    1. Vagina, biopsy - ATYPICAL CELLS, SUSPICIOUS BUT NOT DIAGNOSTIC OF MALIGNANCY. SEE NOTE 2. Lymph node, sentinel, biopsy, right presacral #1 - LYMPH NODE, NEGATIVE FOR MALIGNANCY (0/1) - IMMUNOSTAIN FOR PANKERATIN SHOWS FOCAL NONSPECIFIC LABELING 3. Lymph node, sentinel, biopsy, right presacral #2 - LYMPH NODE, NEGATIVE FOR MALIGNANCY (0/1) - IMMUNOSTAIN FOR PANKERATIN IS NEGATIVE 4. Lymph node, sentinel, biopsy, left external iliac - LYMPH NODE, NEGATIVE FOR MALIGNANCY (0/1) - IMMUNOSTAIN FOR PANKERATIN IS NEGATIVE 5.  Lymph node, biopsy, right periaortic - TWO LYMPH NODES, NEGATIVE FOR MALIGNANCY (0/2) 6. Lymph node, biopsy, left periaortic - SEVEN LYMPH NODES, NEGATIVE FOR MALIGNANCY (0/7) 7. 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 SEE NOTE - TUMOR INVADES A LITTLE OVER HALF OF THE MYOMETRIUM (INVASION DEPTH OF 0.7 CM WHERE MYOMETRIUM IS 1.3 CM THICK) 1. Vagina, biopsy - ATYPICAL CELLS, SUSPICIOUS BUT NOT DIAGNOSTIC OF MALIGNANCY. SEE NOTE 2. Lymph node, sentinel, biopsy, right presacral #1 - LYMPH NODE, NEGATIVE FOR MALIGNANCY (0/1) - IMMUNOSTAIN FOR PANKERATIN SHOWS FOCAL NONSPECIFIC LABELING 3. Lymph node, sentinel, biopsy, right presacral #2 - LYMPH NODE, NEGATIVE FOR MALIGNANCY (0/1) - IMMUNOSTAIN FOR PANKERATIN IS NEGATIVE 4. Lymph node, sentinel, biopsy, left external iliac - LYMPH NODE, NEGATIVE FOR MALIGNANCY (0/1) - IMMUNOSTAIN FOR PANKERATIN IS NEGATIVE 5. Lymph node, biopsy, right periaortic - TWO LYMPH NODES, NEGATIVE FOR MALIGNANCY (0/2) 6. Lymph node, biopsy, left periaortic - SEVEN LYMPH NODES, NEGATIVE FOR MALIGNANCY (0/7) 7. 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 SEE NOTE - TUMOR INVADES A LITTLE OVER HALF OF THE MYOMETRIUM (INVASION DEPTH OF 0.7 CM WHERE MYOMETRIUM IS 1.3 CM THICK)  7. ONCOLOGY TABLE-UTERUS, CARCINOMA OR CARCINOSARCOMA 1 of 4 Duplicate copy FINAL for Plotkin, Ama [phone]) Microscopic Comment(continued) Specimen: Uterus with left fallopian tube and ovary Procedure: Total hysterectomy  with left salpingoophorectomy Lymph node sampling performed: Sentinel and non-sentinel Specimen integrity: Intact Maximum tumor size: 4.2 cm Histologic type: Carcinosarcoma (Malignant  mullerian mixed tumor) Grade: High-grade Myometrial invasion: 0.7 cm where myometrium is 1.3 cm in thickness Cervical stromal involvement: Not identified Extent of involvement of other organs: Not involved Lymph - vascular invasion: Not identified Peritoneal washings: NA Lymph nodes: Examined: 3 Sentinel 9 Non-sentinel 12 Total Lymph nodes with metastasis: 0 Isolated tumor cells (< 0.2 mm): 0 Micrometastasis: (> 0.2 mm and < 2.0 mm): 0 Macrometastasis: (> 2.0 mm): 0 Extracapsular extension: NA TNM code: pT1b, pN0 FIGO Stage (based on pathologic findings, needs clinical correlation): 1B      04/29/2017 Imaging    CT abdomen Multiloculated bilateral retroperitoneal fluid collections, consistent with postoperative lymphoceles.  No evidence of hydronephrosis or other complication      07/29/812 - 05/02/2017 Hospital Admission    She was admitted for evaluation of severe pain. CT showed fluid collection and IR was consulted. Drain was placed to drain the fluid collection      04/30/2017 Cancer Staging    Staging form: Corpus Uteri - Carcinoma and Carcinosarcoma, AJCC 8th Edition - Pathologic: Stage IB (pT1b, pN0, cM0) - Signed by Heath Lark, MD on 04/30/2017      04/30/2017 Procedure    Successful abdominal abscess drain x2.      05/06/2017 Imaging    1. Interval retraction of right-sided percutaneous drainage catheter with end now coiled and locked peripheral the grossly unchanged right-sided retroperitoneal fluid collection, presumed postoperative seroma. 2. Unchanged positioning of left-sided percutaneous drainage catheter with end coiled and locked within the peripheral aspect of the grossly unchanged left-sided retroperitoneal fluid collection, presumed postoperative seroma.  PLAN: - The malpositioned right-sided retroperitoneal drainage catheter was removed at the patient's bedside without incident      05/18/2017 Imaging    Right retroperitoneal fluid collections are improved.  The right drain has been repositioned into the more anterior fluid collection which is smaller than on the prior study.  The lateral left retroperitoneal fluid collection has resolved after drainage. The more medial and anterior fluid collection has increased in size. The drain is not coiled in this more medial fluid collection.      06/03/2017 Imaging    New left retroperitoneal drain has been placed into a fluid collection. This fluid collection has improved from 4.4 x 4.5 cm to 3.2 x 3.8 cm.  The other left-sided drain remains in place without associated abscess.  The right-sided fluid collection containing a drain has resolved.  No new fluid collection.  Other incidental findings are noted.      06/09/2017 Imaging    Unchanged position of left retroperitoneal pigtail drainage catheter, with decreasing size of residual rim enhancing fluid collection/abscess.  Interval removal of retroperitoneal pigtail drainage catheter from the right.  Mild dilation of the left collecting system and left ureter, with the proximal left ureter inseparable from the inflammatory changes adjacent to the pigtail catheter. I would favor this not to represent ureteral obstruction, but instead reactive dilation.      06/24/2017 Imaging    Resolution of left-sided retroperitoneal lymphocele at the level of the single remaining retroperitoneal drainage catheter. Small residual lymphoceles are identified in the right retroperitoneum lateral to the right common iliac artery and vein measuring approximately 1.9 cm in greatest diameter and adjacent to the lower pole of the right kidney measuring 9 mm. Two other small residual lymphoceles are seen adjacent to the left  external iliac artery measuring 11 mm and 10 mm respectively. All of these residual small collections are far smaller than the original collections present postoperatively on the 04/29/2017 scan and are currently too small to warrant drainage.  The left  retroperitoneal drain was removed today. See separately dictated clinic note in Epic.      07/07/2017 Procedure    Successful placement of a right IJ approach Power Port with ultrasound and fluoroscopic guidance. The catheter is ready for use.      07/09/2017 -  Chemotherapy    The patient had carboplatin and taxol       REVIEW OF SYSTEMS:   Eyes: Denies blurriness of vision Ears, nose, mouth, throat, and face: Denies mucositis or sore throat Respiratory: Denies cough, dyspnea or wheezes Cardiovascular: Denies palpitation, chest discomfort or lower extremity swelling Skin: Denies abnormal skin rashes Lymphatics: Denies new lymphadenopathy or easy bruising Behavioral/Psych: Mood is stable, no new changes  All other systems were reviewed with the patient and are negative.  I have reviewed the past medical history, past surgical history, social history and family history with the patient and they are unchanged from previous note.  ALLERGIES:  is allergic to armodafinil; invokana [canagliflozin]; lasix [furosemide]; and adhesive [tape].  MEDICATIONS:  Current Outpatient Medications  Medication Sig Dispense Refill  . Acetaminophen (TYLENOL PO) Take 1 tablet by mouth every 6 (six) hours as needed (pain/headache).    . ALPRAZolam (XANAX) 1 MG tablet TAKE 1/2 TO 1 TABLET BY MOUTH AT BEDTIME AS NEEDED FOR ANXIETY (Patient taking differently: TAKE 1/2 TO 1 TABLET (0.5 - 1 MG)  BY MOUTH AT BEDTIME AS NEEDED FOR ANXIETY) 30 tablet 5  . dexamethasone (DECADRON) 4 MG tablet Take 2 tabs the night before and 2 tabs the morning of chemotherapy, every 3 weeks (Patient not taking: Reported on 08/19/2017) 30 tablet 0  . DULoxetine (CYMBALTA) 20 MG capsule Take 1 capsule (20 mg total) by mouth 2 (two) times daily. Once daily for 2 weeks then increase to twice daily 60 capsule 3  . ezetimibe (ZETIA) 10 MG tablet Take 10 mg by mouth at bedtime.   12  . gabapentin (NEURONTIN) 100 MG capsule Take 200 mg two  times a day (Patient taking differently: 300 mg 2 (two) times daily. Take 200 mg two times a day) 120 capsule 0  . glucagon (GLUCAGON EMERGENCY) 1 MG injection Inject 1 mg into the muscle once as needed (low blood sugar).    . Insulin Human (INSULIN PUMP) SOLN Inject into the skin continuous. Use with Novolog    . lidocaine-prilocaine (EMLA) cream Apply to affected area once 30 g 3  . losartan-hydrochlorothiazide (HYZAAR) 50-12.5 MG tablet TAKE 1 TABLET BY MOUTH EVERY DAY (Patient taking differently: TAKE 1/2 TABLET BY MOUTH EVERY DAY) 90 tablet 0  . ondansetron (ZOFRAN) 8 MG tablet Take 1 tablet (8 mg total) by mouth every 8 (eight) hours as needed for refractory nausea / vomiting. Start on day 3 after chemo. 30 tablet 1  . prochlorperazine (COMPAZINE) 10 MG tablet TAKE 1 TABLET BY MOUTH EVERY 6 HOURS AS NEEDED FOR NAUSEA OR VOMITING 30 tablet 1  . promethazine (PHENERGAN) 25 MG tablet Take 1 tablet (25 mg total) by mouth every 6 (six) hours as needed for nausea or vomiting. 30 tablet 0  . senna (SENOKOT) 8.6 MG TABS tablet Take 1 tablet (8.6 mg total) by mouth at bedtime. 120 each 0  . SUMAtriptan (IMITREX) 100 MG tablet TAKE 1 TABLET  BY MOUTH AS NEEDED FOR MIGRAINES. MAY REPEAT IN 2 HOURS IF NEEDED (Patient taking differently: Take 100 mg by mouth every 2 (two) hours as needed for migraine. ) 30 tablet 5  . traMADol (ULTRAM) 50 MG tablet TAKE 2 TABLETS BY MOUTH EVERY 4 HOURS AS NEEDED (Patient taking differently: TAKE 2 TABLET (50 MG) BY MOUTH EVERY 4 HOURS AS NEEDED FOR PAIN) 360 tablet 1  . traMADol (ULTRAM) 50 MG tablet 1 to 2 Q four hours prn pain (Patient not taking: Reported on 08/19/2017) 15 tablet 0  . traZODone (DESYREL) 100 MG tablet Take 100 mg by mouth at bedtime.   1   No current facility-administered medications for this visit.     PHYSICAL EXAMINATION: ECOG PERFORMANCE STATUS: 3 - Symptomatic, >50% confined to bed  Vitals:   08/24/17 1410  BP: (!) 96/59  Pulse: (!) 102  Resp:  18  Temp: 97.8 F (36.6 C)  SpO2: 99%   Filed Weights   08/24/17 1410  Weight: 187 lb 1.6 oz (84.9 kg)    GENERAL:alert, no distress and comfortable. She looks miserable SKIN: skin color, texture, turgor are normal, no rashes or significant lesions EYES: normal, Conjunctiva are pink and non-injected, sclera clear OROPHARYNX:no exudate, no erythema and lips, buccal mucosa, and tongue normal  NECK: supple, thyroid normal size, non-tender, without nodularity LYMPH:  no palpable lymphadenopathy in the cervical, axillary or inguinal LUNGS: clear to auscultation and percussion with normal breathing effort HEART: tachycardia and no lower extremity edema ABDOMEN:abdomen soft, non-tender and normal bowel sounds Musculoskeletal:no cyanosis of digits and no clubbing  NEURO: alert & oriented x 3 with fluent speech, no focal motor/sensory deficits  LABORATORY DATA:  I have reviewed the data as listed    Component Value Date/Time   NA 136 08/24/2017 1329   K 3.1 (L) 08/24/2017 1329   CL 92 (L) 08/24/2017 1329   CO2 31 08/24/2017 1329   GLUCOSE 202 (H) 08/24/2017 1329   BUN 15 08/24/2017 1329   CREATININE 1.10 (H) 08/24/2017 1329   CALCIUM 9.4 08/24/2017 1329   PROT 7.0 08/24/2017 1329   ALBUMIN 2.9 (L) 08/24/2017 1329   AST 10 (L) 08/24/2017 1329   ALT <6 08/24/2017 1329   ALKPHOS 102 08/24/2017 1329   BILITOT 0.5 08/24/2017 1329   GFRNONAA 50 (L) 08/24/2017 1329   GFRAA 58 (L) 08/24/2017 1329    No results found for: SPEP, UPEP  Lab Results  Component Value Date   WBC 6.8 08/24/2017   NEUTROABS 5.1 08/24/2017   HGB 9.9 (L) 08/24/2017   HCT 29.8 (L) 08/24/2017   MCV 87.9 08/24/2017   PLT 195 08/24/2017      Chemistry      Component Value Date/Time   NA 136 08/24/2017 1329   K 3.1 (L) 08/24/2017 1329   CL 92 (L) 08/24/2017 1329   CO2 31 08/24/2017 1329   BUN 15 08/24/2017 1329   CREATININE 1.10 (H) 08/24/2017 1329      Component Value Date/Time   CALCIUM 9.4  08/24/2017 1329   ALKPHOS 102 08/24/2017 1329   AST 10 (L) 08/24/2017 1329   ALT <6 08/24/2017 1329   BILITOT 0.5 08/24/2017 1329     All questions were answered. The patient knows to call the clinic with any problems, questions or concerns. No barriers to learning was detected.  I spent 30 minutes counseling the patient face to face. The total time spent in the appointment was 40 minutes and more than  50% was on counseling and review of test results  Heath Lark, MD 08/26/2017 11:22 AM

## 2017-08-26 NOTE — Assessment & Plan Note (Addendum)
She had recent profound weight loss secondary to dehydration due to nausea from treatment I recommend IV fluid support at home through advanced home care agency daily If she is able to adequately hydrate herself, we can space out the appointment

## 2017-08-26 NOTE — Assessment & Plan Note (Signed)
She had recent uncontrolled nausea vomiting She felt that she tolerated Phenergan well We will continue Phenergan as needed along with IV fluid support and IV antiemetics as needed

## 2017-08-27 ENCOUNTER — Telehealth: Payer: Self-pay | Admitting: *Deleted

## 2017-08-27 NOTE — Telephone Encounter (Signed)
[facility] requested prescription for a walker for Katie Palmer. Prescription faxed.

## 2017-08-29 ENCOUNTER — Inpatient Hospital Stay (HOSPITAL_COMMUNITY)
Admission: EM | Admit: 2017-08-29 | Discharge: 2017-09-02 | DRG: 640 | Disposition: A | Discharge status: Home / Self Care | Payer: Medicare Other | Attending: Internal Medicine | Admitting: Internal Medicine

## 2017-08-29 ENCOUNTER — Encounter (HOSPITAL_COMMUNITY): Payer: Self-pay | Admitting: Emergency Medicine

## 2017-08-29 ENCOUNTER — Emergency Department (HOSPITAL_COMMUNITY): Payer: Medicare Other

## 2017-08-29 ENCOUNTER — Other Ambulatory Visit: Payer: Self-pay

## 2017-08-29 DIAGNOSIS — M797 Fibromyalgia: Secondary | ICD-10-CM | POA: Diagnosis present

## 2017-08-29 DIAGNOSIS — C786 Secondary malignant neoplasm of retroperitoneum and peritoneum: Secondary | ICD-10-CM | POA: Diagnosis present

## 2017-08-29 DIAGNOSIS — N131 Hydronephrosis with ureteral stricture, not elsewhere classified: Secondary | ICD-10-CM | POA: Diagnosis present

## 2017-08-29 DIAGNOSIS — K5900 Constipation, unspecified: Secondary | ICD-10-CM | POA: Diagnosis present

## 2017-08-29 DIAGNOSIS — G893 Neoplasm related pain (acute) (chronic): Secondary | ICD-10-CM

## 2017-08-29 DIAGNOSIS — C7989 Secondary malignant neoplasm of other specified sites: Secondary | ICD-10-CM | POA: Diagnosis present

## 2017-08-29 DIAGNOSIS — E860 Dehydration: Principal | ICD-10-CM

## 2017-08-29 DIAGNOSIS — I82220 Acute embolism and thrombosis of inferior vena cava: Secondary | ICD-10-CM

## 2017-08-29 DIAGNOSIS — K5909 Other constipation: Secondary | ICD-10-CM

## 2017-08-29 DIAGNOSIS — Z66 Do not resuscitate: Secondary | ICD-10-CM | POA: Diagnosis present

## 2017-08-29 DIAGNOSIS — F329 Major depressive disorder, single episode, unspecified: Secondary | ICD-10-CM | POA: Diagnosis present

## 2017-08-29 DIAGNOSIS — Z515 Encounter for palliative care: Secondary | ICD-10-CM | POA: Diagnosis present

## 2017-08-29 DIAGNOSIS — F32A Depression, unspecified: Secondary | ICD-10-CM | POA: Diagnosis present

## 2017-08-29 DIAGNOSIS — C541 Malignant neoplasm of endometrium: Secondary | ICD-10-CM

## 2017-08-29 DIAGNOSIS — I10 Essential (primary) hypertension: Secondary | ICD-10-CM | POA: Diagnosis present

## 2017-08-29 DIAGNOSIS — Z6830 Body mass index (BMI) 30.0-30.9, adult: Secondary | ICD-10-CM

## 2017-08-29 DIAGNOSIS — F411 Generalized anxiety disorder: Secondary | ICD-10-CM | POA: Diagnosis present

## 2017-08-29 DIAGNOSIS — D638 Anemia in other chronic diseases classified elsewhere: Secondary | ICD-10-CM | POA: Diagnosis present

## 2017-08-29 DIAGNOSIS — E876 Hypokalemia: Secondary | ICD-10-CM

## 2017-08-29 DIAGNOSIS — E43 Unspecified severe protein-calorie malnutrition: Secondary | ICD-10-CM | POA: Diagnosis present

## 2017-08-29 DIAGNOSIS — E1040 Type 1 diabetes mellitus with diabetic neuropathy, unspecified: Secondary | ICD-10-CM | POA: Diagnosis present

## 2017-08-29 DIAGNOSIS — R112 Nausea with vomiting, unspecified: Secondary | ICD-10-CM | POA: Diagnosis present

## 2017-08-29 DIAGNOSIS — R630 Anorexia: Secondary | ICD-10-CM | POA: Diagnosis present

## 2017-08-29 LAB — COMPREHENSIVE METABOLIC PANEL
ALT: 8 U/L (ref 0–44)
AST: 12 U/L — ABNORMAL LOW (ref 15–41)
Albumin: 2.5 g/dL — ABNORMAL LOW (ref 3.5–5.0)
Alkaline Phosphatase: 74 U/L (ref 38–126)
Anion gap: 13 (ref 5–15)
BUN: 8 mg/dL (ref 8–23)
CHLORIDE: 97 mmol/L — AB (ref 98–111)
CO2: 28 mmol/L (ref 22–32)
CREATININE: 0.94 mg/dL (ref 0.44–1.00)
Calcium: 8.2 mg/dL — ABNORMAL LOW (ref 8.9–10.3)
GFR calc Af Amer: >60 mL/min (ref >60)
GFR calc non Af Amer: >60 mL/min (ref >60)
Glucose, Bld: 159 mg/dL — ABNORMAL HIGH (ref 70–99)
Potassium: 2.7 mmol/L — CL (ref 3.5–5.1)
Sodium: 138 mmol/L (ref 135–145)
Total Bilirubin: 1 mg/dL (ref 0.3–1.2)
Total Protein: 6 g/dL — ABNORMAL LOW (ref 6.5–8.1)

## 2017-08-29 LAB — CBC WITH DIFFERENTIAL/PLATELET
Basophils Absolute: 0 10*3/uL (ref 0.0–0.1)
Basophils Relative: 0 %
Eosinophils Absolute: 0 10*3/uL (ref 0.0–0.7)
Eosinophils Relative: 0 %
HCT: 26.8 % — ABNORMAL LOW (ref 36.0–46.0)
Hemoglobin: 9 g/dL — ABNORMAL LOW (ref 12.0–15.0)
LYMPHS PCT: 13 %
Lymphs Abs: 0.7 10*3/uL (ref 0.7–4.0)
MCH: 29.9 pg (ref 26.0–34.0)
MCHC: 33.6 g/dL (ref 30.0–36.0)
MCV: 89 fL (ref 78.0–100.0)
Monocytes Absolute: 0.5 10*3/uL (ref 0.1–1.0)
Monocytes Relative: 9 %
Neutro Abs: 3.8 10*3/uL (ref 1.7–7.7)
Neutrophils Relative %: 78 %
Platelets: 274 10*3/uL (ref 150–400)
RBC: 3.01 MIL/uL — ABNORMAL LOW (ref 3.87–5.11)
RDW: 19.9 % — ABNORMAL HIGH (ref 11.5–15.5)
WBC: 4.9 10*3/uL (ref 4.0–10.5)

## 2017-08-29 LAB — GLUCOSE, CAPILLARY: Glucose-Capillary: 98 mg/dL (ref 70–99)

## 2017-08-29 LAB — MAGNESIUM: Magnesium: 1.7 mg/dL (ref 1.7–2.4)

## 2017-08-29 LAB — LIPASE, BLOOD: Lipase: 18 U/L (ref 11–51)

## 2017-08-29 LAB — CBG MONITORING, ED: GLUCOSE-CAPILLARY: 154 mg/dL — AB (ref 70–99)

## 2017-08-29 LAB — I-STAT CG4 LACTIC ACID, ED
Lactic Acid, Venous: 0.89 mmol/L (ref 0.5–1.9)
Lactic Acid, Venous: 0.53 mmol/L (ref 0.5–1.9)

## 2017-08-29 MED ORDER — TRAMADOL HCL 50 MG PO TABS
50.0000 mg | ORAL_TABLET | ORAL | Status: DC | PRN
  Administered 2017-08-29 – 2017-08-30 (×3): 50 mg via ORAL
  Filled 2017-08-29 (×3): qty 1

## 2017-08-29 MED ORDER — ACETAMINOPHEN 325 MG PO TABS: 650.0000 mg | ORAL_TABLET | Freq: Four times a day (QID) | ORAL | Status: DC | PRN

## 2017-08-29 MED ORDER — TRAZODONE HCL 50 MG PO TABS
100.0000 mg | ORAL_TABLET | Freq: Every day | ORAL | Status: DC
  Administered 2017-08-29 – 2017-09-01 (×4): 100 mg via ORAL
  Filled 2017-08-29 (×4): qty 2

## 2017-08-29 MED ORDER — SODIUM CHLORIDE 0.9 % IV BOLUS
1000.0000 mL | Freq: Once | INTRAVENOUS | Status: AC
Start: 2017-08-29 — End: 2017-08-30
  Administered 2017-08-29: 1000 mL via INTRAVENOUS

## 2017-08-29 MED ORDER — ENOXAPARIN SODIUM 40 MG/0.4ML ~~LOC~~ SOLN
40.0000 mg | SUBCUTANEOUS | Status: DC
  Administered 2017-08-29 – 2017-08-30 (×2): 40 mg via SUBCUTANEOUS
  Filled 2017-08-29 (×2): qty 0.4

## 2017-08-29 MED ORDER — PROMETHAZINE HCL 25 MG/ML IJ SOLN
12.5000 mg | Freq: Once | INTRAMUSCULAR | Status: AC
  Administered 2017-08-29: 12.5 mg via INTRAVENOUS
  Filled 2017-08-29: qty 1

## 2017-08-29 MED ORDER — POLYETHYLENE GLYCOL 3350 17 G PO PACK: 17.0000 g | PACK | Freq: Every day | ORAL | Status: DC | PRN

## 2017-08-29 MED ORDER — INSULIN ASPART 100 UNIT/ML ~~LOC~~ SOLN: 0.0000 [IU] | Freq: Three times a day (TID) | SUBCUTANEOUS | Status: DC

## 2017-08-29 MED ORDER — ACETAMINOPHEN 650 MG RE SUPP: 650.0000 mg | Freq: Four times a day (QID) | RECTAL | Status: DC | PRN

## 2017-08-29 MED ORDER — ALPRAZOLAM 0.5 MG PO TABS
0.5000 mg | ORAL_TABLET | Freq: Every evening | ORAL | Status: DC | PRN
  Administered 2017-08-29 – 2017-09-01 (×2): 1 mg via ORAL
  Filled 2017-08-29 (×2): qty 2

## 2017-08-29 MED ORDER — DULOXETINE HCL 20 MG PO CPEP
20.0000 mg | ORAL_CAPSULE | Freq: Two times a day (BID) | ORAL | Status: DC
  Administered 2017-08-29 – 2017-09-02 (×8): 20 mg via ORAL
  Filled 2017-08-29 (×8): qty 1

## 2017-08-29 MED ORDER — POTASSIUM CHLORIDE CRYS ER 20 MEQ PO TBCR: 40.0000 meq | EXTENDED_RELEASE_TABLET | Freq: Once | ORAL | Status: DC

## 2017-08-29 MED ORDER — GABAPENTIN 100 MG PO CAPS
200.0000 mg | ORAL_CAPSULE | Freq: Two times a day (BID) | ORAL | Status: DC
  Administered 2017-08-29 – 2017-08-31 (×5): 200 mg via ORAL
  Filled 2017-08-29 (×5): qty 2

## 2017-08-29 MED ORDER — ONDANSETRON HCL 4 MG/2ML IJ SOLN
4.0000 mg | Freq: Four times a day (QID) | INTRAMUSCULAR | Status: DC | PRN
  Administered 2017-08-31 – 2017-09-01 (×2): 4 mg via INTRAVENOUS
  Filled 2017-08-29 (×3): qty 2

## 2017-08-29 MED ORDER — ACETAMINOPHEN 500 MG PO TABS
1000.0000 mg | ORAL_TABLET | Freq: Once | ORAL | Status: AC
  Administered 2017-08-29: 1000 mg via ORAL
  Filled 2017-08-29: qty 2

## 2017-08-29 MED ORDER — ONDANSETRON HCL 4 MG PO TABS: 4.0000 mg | ORAL_TABLET | Freq: Four times a day (QID) | ORAL | Status: DC | PRN

## 2017-08-29 MED ORDER — EZETIMIBE 10 MG PO TABS
10.0000 mg | ORAL_TABLET | Freq: Every day | ORAL | Status: DC
  Administered 2017-08-29 – 2017-08-31 (×3): 10 mg via ORAL
  Filled 2017-08-29 (×3): qty 1

## 2017-08-29 MED ORDER — POTASSIUM CHLORIDE 10 MEQ/100ML IV SOLN
10.0000 meq | INTRAVENOUS | Status: AC
  Administered 2017-08-29 (×3): 10 meq via INTRAVENOUS
  Filled 2017-08-29 (×4): qty 100

## 2017-08-29 MED ORDER — SODIUM CHLORIDE 0.9 % IV SOLN
INTRAVENOUS | Status: DC
  Administered 2017-08-30 – 2017-09-02 (×9): via INTRAVENOUS

## 2017-08-29 NOTE — ED Triage Notes (Addendum)
Patient here from home with complaints of being unable to eat due to nausea, vomiting. Cancer patient, radiation soon. Given 4mg  Zofran, 152mcg Fentanyl, 750cc NS.

## 2017-08-29 NOTE — ED Notes (Signed)
Date and time results received: 08/29/17 1700 (use smartphrase ".now" to insert current time)  Test: K+  Critical Value: 2.7  Name of Provider Notified: Long  Orders Received? Or Actions Taken?: Actions Taken: Chiropodist and EDP

## 2017-08-29 NOTE — ED Notes (Signed)
ED TO INPATIENT HANDOFF REPORT  Name/Age/Gender Katie Palmer 67 y.o. female  Code Status Code Status History    Date Active Date Inactive Code Status Order ID Comments User Context   04/29/2017 1634 05/02/2017 1627 Full Code [id number]  Dorothyann Gibbs, NP Inpatient   04/15/2017 1249 04/16/2017 1740 Full Code [id number]  Everitt Amber, MD Inpatient   05/20/2014 1438 05/22/2014 1319 Full Code [id number]  Rama, Venetia Maxon, MD Inpatient   04/12/2014 1301 04/14/2014 1513 Full Code [id number]  Thurnell Lose, MD ED   07/28/2013 1746 07/30/2013 1748 Full Code [id number]  Rama, Venetia Maxon, MD Inpatient      Home/SNF/Other Home  Chief Complaint low appetite; ca pt  Level of Care/Admitting Diagnosis ED Disposition    ED Disposition Condition [facility] [[id number]]  Level of Care: Med-Surg [16]  Diagnosis: Dehydration [276.51.ICD-9-CM]  Admitting Physician: Dessa Phi [[id number]]  Attending Physician: Dessa Phi [phone]  PT Class (Do Not Modify): Observation [104]  PT Acc Code (Do Not Modify): Observation [10022]       Medical History Past Medical History:  Diagnosis Date  . Depression   . Fibromyalgia   . Headache(784.0)   . History of colon polyps   . History of diabetes with ketoacidosis   . Hx of colonic polyps   . Hypertension   . Insomnia   . Insulin pump in place   . RBBB (right bundle branch block)   . RLQ abdominal pain   . Thickened endometrium   . Type 1 diabetes mellitus with long-term current use of insulin [facility]) endocrinologist-- dr Chalmers Cater   July 2015--  pt took Invokana and caused complete pancreas shut-down , pt went from pre-diabetic to adult type 1 dm now insulin dependent  . Vertigo     Allergies Allergies  Allergen Reactions  . Armodafinil Other (See Comments)    Flu-like symptoms  . Invokana [Canagliflozin] Other (See Comments)    "shut down pancreas"   . Lasix [Furosemide] Diarrhea    Severe  diarrhea  . Adhesive [Tape] Itching and Rash    Pt has not tried paper tape    IV Location/Drains/Wounds Patient Lines/Drains/Airways Status   Active Line/Drains/Airways    Name:   Placement date:   Placement time:   Site:   Days:   Implanted Port 07/07/17 Right Chest   07/07/17    1455    Chest   53   Closed System Drain 1 Left;Posterior Back Other (Comment) 10 Fr.   04/30/17    1644    Back   121   Closed System Drain 2 Right;Posterior Back Other (Comment) 10 Fr.   04/30/17    1644    Back   121   Closed System Drain Left Back Bulb (JP) 16 Fr.   05/21/17    1030    Back   100   Incision (Closed) 12/25/15 Abdomen Other (Comment)   12/25/15    0804     613   Incision (Closed) 12/25/15 Perineum Other (Comment)   12/25/15    0804     613   Incision - 3 Ports Abdomen 1: Umbilicus 2: Right;Mid 3: Left;Mid   12/25/15    -     613          Labs/Imaging Results for orders placed or performed during the hospital encounter of 08/29/17 (from the past 48 hour(s))  CBG monitoring, ED  Status: Abnormal   Collection Time: 08/29/17  3:38 PM  Result Value Ref Range   Glucose-Capillary 154 (H) 70 - 99 mg/dL   Comment 1 Notify RN   Comprehensive metabolic panel     Status: Abnormal   Collection Time: 08/29/17  4:22 PM  Result Value Ref Range   Sodium 138 135 - 145 mmol/L   Potassium 2.7 (LL) 3.5 - 5.1 mmol/L    Comment: CRITICAL RESULT CALLED TO, READ BACK BY AND VERIFIED WITH: CLAPP,S RN [phone] COVINGTON,N    Chloride 97 (L) 98 - 111 mmol/L   CO2 28 22 - 32 mmol/L   Glucose, Bld 159 (H) 70 - 99 mg/dL   BUN 8 8 - 23 mg/dL   Creatinine, Ser 0.94 0.44 - 1.00 mg/dL   Calcium 8.2 (L) 8.9 - 10.3 mg/dL   Total Protein 6.0 (L) 6.5 - 8.1 g/dL   Albumin 2.5 (L) 3.5 - 5.0 g/dL   AST 12 (L) 15 - 41 U/L   ALT 8 0 - 44 U/L   Alkaline Phosphatase 74 38 - 126 U/L   Total Bilirubin 1.0 0.3 - 1.2 mg/dL   GFR calc non Af Amer >60 >60 mL/min   GFR calc Af Amer >60 >60 mL/min    Comment:  (NOTE) The eGFR has been calculated using the CKD EPI equation. This calculation has not been validated in all clinical situations. eGFR's persistently <60 mL/min signify possible Chronic Kidney Disease.    Anion gap 13 5 - 15    Comment: Performed at [facility], [place] [address] [place], [place] [address]  Lipase, blood     Status: None   Collection Time: 08/29/17  4:22 PM  Result Value Ref Range   Lipase 18 11 - 51 U/L    Comment: Performed at [facility], [place] [address] [place], [place] [address]  CBC with Differential     Status: Abnormal   Collection Time: 08/29/17  4:22 PM  Result Value Ref Range   WBC 4.9 4.0 - 10.5 K/uL   RBC 3.01 (L) 3.87 - 5.11 MIL/uL   Hemoglobin 9.0 (L) 12.0 - 15.0 g/dL   HCT 26.8 (L) 36.0 - 46.0 %   MCV 89.0 78.0 - 100.0 fL   MCH 29.9 26.0 - 34.0 pg   MCHC 33.6 30.0 - 36.0 g/dL   RDW 19.9 (H) 11.5 - 15.5 %   Platelets 274 150 - 400 K/uL   Neutrophils Relative % 78 %   Neutro Abs 3.8 1.7 - 7.7 K/uL   Lymphocytes Relative 13 %   Lymphs Abs 0.7 0.7 - 4.0 K/uL   Monocytes Relative 9 %   Monocytes Absolute 0.5 0.1 - 1.0 K/uL   Eosinophils Relative 0 %   Eosinophils Absolute 0.0 0.0 - 0.7 K/uL   Basophils Relative 0 %   Basophils Absolute 0.0 0.0 - 0.1 K/uL    Comment: Performed at [facility], [place] [address] [place], [place] [address]  I-Stat CG4 Lactic Acid, ED     Status: None   Collection Time: 08/29/17  4:28 PM  Result Value Ref Range   Lactic Acid, Venous 0.89 0.5 - 1.9 mmol/L  I-Stat CG4 Lactic Acid, ED     Status: None   Collection Time: 08/29/17  6:20 PM  Result Value Ref Range   Lactic Acid, Venous 0.53 0.5 - 1.9 mmol/L   Dg Abdomen Acute W/chest  Result Date: 08/29/2017 CLINICAL DATA:  Nausea and vomiting for 3 weeks.  History of cancer. EXAM: DG ABDOMEN ACUTE W/ 1V CHEST COMPARISON:  Abdominal radiograph July 12, 2017 FINDINGS: Cardiomediastinal silhouette is normal.  Single-lumen RIGHT chest Port-A-Cath distal tip projecting in proximal superior vena cava. Lungs are clear, no pleural effusions. No pneumothorax. Soft tissue planes and included osseous structures are non suspicious. Mildly elevated RIGHT hemidiaphragm. Bowel gas pattern is nondilated and nonobstructive. No significant retained large bowel stool volume. 15 mm linear metallic foreign body projecting at LEFT iliac crest on single view. Phleboliths project in the pelvis. No intra-abdominal mass effect, pathologic calcifications or free air. Soft tissue planes and included osseous structures are non-suspicious. IMPRESSION: 1. No acute cardiopulmonary process. 2. Normal bowel gas pattern. 3. Needle-like fragment projecting over LEFT iliac crest on single view, potentially external to patient. Electronically Signed   By: Elon Alas M.D.   On: 08/29/2017 16:43    Pending Labs Unresulted Labs (From admission, onward)   Start     Ordered   08/29/17 1750  Magnesium  Add-on,   R     08/29/17 1749   08/29/17 1557  Urine culture  STAT,   STAT    Question:  Patient immune status  Answer:  Normal   08/29/17 1557   08/29/17 1557  Urinalysis, Routine w reflex microscopic  STAT,   STAT     08/29/17 1557   08/29/17 1557  Culture, blood (routine x 2)  BLOOD CULTURE X 2,   STAT    Question:  Patient immune status  Answer:  Normal   08/29/17 1557   Signed and Held  HIV antibody (Routine Testing)  Once,   R     Signed and Held   Signed and Held  CBC  Tomorrow morning,   R     Signed and Held   Signed and Held  Basic metabolic panel  Tomorrow morning,   R     Signed and Held      Vitals/Pain Today's Vitals   08/29/17 1528 08/29/17 1529 08/29/17 1635 08/29/17 1739  BP:  133/86 138/82 139/78  Pulse:  80 81 86  Resp:  18 16 (!) 21  Temp:  100 F (37.8 C)    TempSrc:  Oral    SpO2:  99% 95% 96%  PainSc: 5        Isolation Precautions No active isolations  Medications Medications  potassium  chloride 10 mEq in 100 mL IVPB (10 mEq Intravenous New Bag/Given 08/29/17 1739)  potassium chloride SA (K-DUR,KLOR-CON) CR tablet 40 mEq (40 mEq Oral Not Given 08/29/17 1739)  sodium chloride 0.9 % bolus 1,000 mL (1,000 mLs Intravenous New Bag/Given 08/29/17 1633)  promethazine (PHENERGAN) injection 12.5 mg (12.5 mg Intravenous Given 08/29/17 1632)  acetaminophen (TYLENOL) tablet 1,000 mg (1,000 mg Oral Given 08/29/17 1630)    Mobility walks

## 2017-08-29 NOTE — H&P (Signed)
History and Physical    Katie Palmer DGU:[id number] DOB: [birth year] DOA: 08/29/2017  PCP: Katie Morale, MD  Patient coming from: Home  Chief Complaint: Nausea, vomiting   HPI: Katie Palmer is a 67 y.o. female with medical history significant of endometrial cancer, most recent chemotherapy 3 weeks ago with side effects including uncontrolled nausea, vomiting, dehydration. Chemotherapy has been on hold.  Also with past medical history of diabetes, anxiety and depression, essential hypertension.  She states that she has had some nausea and vomiting since her surgery back in April.  This has been getting worse in the past few weeks after chemotherapy treatment.  She states that she vomits intermittently, twice yesterday and once this morning.  She denies any abdominal pain, diarrhea.  No fevers at home.  Denies any chest pain or shortness of breath or cough.  She states that she has not eaten anything in days.  ED Course: Labs obtained in the emergency department which revealed hypokalemia potassium 2.7.  Chest x-ray and abdominal x-ray obtained, no acute process.  She was given potassium supplementation, IV fluid, Phenergan.  Review of Systems: As per HPI otherwise 10 point review of systems negative.   Past Medical History:  Diagnosis Date  . Depression   . Fibromyalgia   . Headache(784.0)   . History of colon polyps   . History of diabetes with ketoacidosis   . Hx of colonic polyps   . Hypertension   . Insomnia   . Insulin pump in place   . RBBB (right bundle branch block)   . RLQ abdominal pain   . Thickened endometrium   . Type 1 diabetes mellitus with long-term current use of insulin [facility]) endocrinologist-- dr Chalmers Cater   July 2015--  pt took Invokana and caused complete pancreas shut-down , pt went from pre-diabetic to adult type 1 dm now insulin dependent  . Vertigo     Past Surgical History:  Procedure Laterality Date  . CARDIOVASCULAR STRESS TEST  07/26/2015   normal  myocardial perfusion study w/ no ischemia/  normal LV function and wall motion , ef 71%  . COLONOSCOPY WITH PROPOFOL  last one 10/ 2017  . HYSTEROSCOPY W/D&C N/A 12/25/2015   Procedure: DILATATION AND CURETTAGE /HYSTEROSCOPY;  Surgeon: Bobbye Charleston, MD;  Location: [facility];  Service: Gynecology;  Laterality: N/A;  . IR IMAGING GUIDED PORT INSERTION  07/07/2017  . IR RADIOLOGIST EVAL & MGMT  05/06/2017  . IR RADIOLOGIST EVAL & MGMT  05/18/2017  . IR RADIOLOGIST EVAL & MGMT  05/13/2017  . IR RADIOLOGIST EVAL & MGMT  06/03/2017  . IR RADIOLOGIST EVAL & MGMT  06/09/2017  . IR RADIOLOGIST EVAL & MGMT  06/24/2017  . LAPAROSCOPIC LYSIS OF ADHESIONS N/A 12/25/2015   Procedure: LAPAROSCOPIC LYSIS OF ADHESIONS;  Surgeon: Bobbye Charleston, MD;  Location: [facility];  Service: Gynecology;  Laterality: N/A;  . LAPAROSCOPIC UNILATERAL SALPINGO OOPHERECTOMY Right 12/25/2015   Procedure: LAPAROSCOPIC UNILATERAL SALPINGO OOPHORECTOMY;  Surgeon: Bobbye Charleston, MD;  Location: [facility];  Service: Gynecology;  Laterality: Right;  . LAPAROSCOPY N/A 12/25/2015   Procedure: LAPAROSCOPY OPERATIVE;  Surgeon: Bobbye Charleston, MD;  Location: [facility];  Service: Gynecology;  Laterality: N/A;  . LYMPH NODE BIOPSY N/A 04/15/2017   Procedure: SENTINEL LYMPH NODE BIOPSY, PERIAORTIC LYMPH NODES, AND VAGINAL BIOPSY;  Surgeon: Everitt Amber, MD;  Location: [facility];  Service: Gynecology;  Laterality: N/A;  . ROBOTIC ASSISTED TOTAL HYSTERECTOMY WITH  BILATERAL SALPINGO OOPHERECTOMY Bilateral 04/15/2017   Procedure: XI ROBOTIC ASSISTED TOTAL LAPROSCOPIC  HYSTERECTOMY WITH LEFT SALPINGO OOPHORECTOMY;  Surgeon: Everitt Amber, MD;  Location: [facility];  Service: Gynecology;  Laterality: Bilateral;  . TONSILLECTOMY  age 67  . TRANSTHORACIC ECHOCARDIOGRAM  07/26/2015   grade 1 diastolic dysfunction , ef 60-65%/  trivial TR     reports that she has never smoked. She has never  used smokeless tobacco. She reports that she does not drink alcohol or use drugs.  Allergies  Allergen Reactions  . Armodafinil Other (See Comments)    Flu-like symptoms  . Invokana [Canagliflozin] Other (See Comments)    "shut down pancreas"   . Lasix [Furosemide] Diarrhea    Severe diarrhea  . Adhesive [Tape] Itching and Rash    Pt has not tried paper tape    Family History  Problem Relation Age of Onset  . Alcohol abuse Father        Died age 66  . Heart disease Father   . Aneurysm Mother        Died age 24  . Leukemia Brother        Died age 35  . COPD Sister   . Rheum arthritis Sister   . Heart failure Sister   . Dementia Maternal Uncle   . Colon cancer Neg Hx     Prior to Admission medications   Medication Sig Start Date End Date Taking? Authorizing Provider  acetaminophen (TYLENOL) 500 MG tablet Take 1,000 mg by mouth every 6 (six) hours as needed.   Yes [provider]  ALPRAZolam (XANAX) 1 MG tablet TAKE 1/2 TO 1 TABLET BY MOUTH AT BEDTIME AS NEEDED FOR ANXIETY Patient taking differently: TAKE 1/2 TO 1 TABLET (0.5 - 1 MG)  BY MOUTH AT BEDTIME AS NEEDED FOR ANXIETY 05/05/16  Yes Katie Morale, MD  DULoxetine (CYMBALTA) 20 MG capsule Take 1 capsule (20 mg total) by mouth 2 (two) times daily. Once daily for 2 weeks then increase to twice daily 08/19/17  Yes Tanner, Lyndon Code., PA-C  ezetimibe (ZETIA) 10 MG tablet Take 10 mg by mouth at bedtime.  09/16/16  Yes [provider]  gabapentin (NEURONTIN) 100 MG capsule Take 200 mg two times a day Patient taking differently: Take 200 mg by mouth 2 (two) times daily.  05/18/17  Yes Cross, [name] D, NP  glucagon (GLUCAGON EMERGENCY) 1 MG injection Inject 1 mg into the muscle once as needed (low blood sugar).   Yes [provider]  Insulin Human (INSULIN PUMP) SOLN Inject into the skin continuous. Use with Novolog   Yes [provider]  lidocaine-prilocaine (EMLA) cream Apply to affected area once  07/07/17  Yes Alvy Bimler, Ni, MD  losartan-hydrochlorothiazide (HYZAAR) 50-12.5 MG tablet TAKE 1 TABLET BY MOUTH EVERY DAY Patient taking differently: TAKE 1/2 TABLET BY MOUTH EVERY DAY 07/19/17  Yes Katie Morale, MD  ondansetron (ZOFRAN) 8 MG tablet Take 1 tablet (8 mg total) by mouth every 8 (eight) hours as needed for refractory nausea / vomiting. Start on day 3 after chemo. 07/07/17  Yes Gorsuch, Ni, MD  prochlorperazine (COMPAZINE) 10 MG tablet TAKE 1 TABLET BY MOUTH EVERY 6 HOURS AS NEEDED FOR NAUSEA OR VOMITING 07/07/17  Yes Heath Lark, MD  promethazine (PHENERGAN) 25 MG tablet Take 1 tablet (25 mg total) by mouth every 6 (six) hours as needed for nausea or vomiting. 08/25/17  Yes Alvy Bimler, Ni, MD  senna (SENOKOT) 8.6 MG TABS tablet Take  1 tablet (8.6 mg total) by mouth at bedtime. 07/12/17  Yes Gorsuch, Ni, MD  SUMAtriptan (IMITREX) 100 MG tablet TAKE 1 TABLET BY MOUTH AS NEEDED FOR MIGRAINES. MAY REPEAT IN 2 HOURS IF NEEDED Patient taking differently: Take 100 mg by mouth every 2 (two) hours as needed for migraine.  02/05/17  Yes Katie Morale, MD  traMADol (ULTRAM) 50 MG tablet TAKE 2 TABLETS BY MOUTH EVERY 4 HOURS AS NEEDED Patient taking differently: TAKE 2 TABLET (50 MG) BY MOUTH EVERY 4 HOURS AS NEEDED FOR PAIN 05/06/17  Yes Katie Morale, MD  traZODone (DESYREL) 100 MG tablet Take 100 mg by mouth at bedtime.  04/15/15  Yes [provider]  Acetaminophen (TYLENOL PO) Take 1 tablet by mouth every 6 (six) hours as needed (pain/headache).    [provider]  dexamethasone (DECADRON) 4 MG tablet Take 2 tabs the night before and 2 tabs the morning of chemotherapy, every 3 weeks Patient not taking: Reported on 08/19/2017 07/07/17   Heath Lark, MD  traMADol (ULTRAM) 50 MG tablet 1 to 2 Q four hours prn pain Patient not taking: Reported on 08/19/2017 07/12/17   Harle Stanford., PA-C    Physical Exam: Vitals:   08/29/17 1529 08/29/17 1635 08/29/17 1739  BP: 133/86 138/82 139/78    Pulse: 80 81 86  Resp: 18 16 (!) 21  Temp: 100 F (37.8 C)    TempSrc: Oral    SpO2: 99% 95% 96%     Constitutional: NAD, calm, comfortable, appears fatigued, weak Eyes: PERRL, lids and conjunctivae normal ENMT: Mucous membranes are dry. Posterior pharynx clear of any exudate or lesions.Normal dentition.  Neck: normal, supple, no masses, no thyromegaly Respiratory: clear to auscultation bilaterally, no wheezing, no crackles. Normal respiratory effort. No accessory muscle use.  Cardiovascular: Regular rate and rhythm, no murmurs / rubs / gallops. No extremity edema.  Abdomen: no tenderness, no masses palpated. No hepatosplenomegaly. Bowel sounds positive.  Musculoskeletal: no clubbing / cyanosis. No joint deformity upper and lower extremities.  Skin: no rashes, lesions, ulcers. No induration Neurologic: CN 2-12 grossly intact. Strength equal all extremities.  No speech deficits. Psychiatric: Normal judgment and insight. Alert and oriented x 3. Normal mood.   Labs on Admission: I have personally reviewed following labs and imaging studies  CBC: Recent Labs  Lab 08/24/17 1329 08/29/17 1622  WBC 6.8 4.9  NEUTROABS 5.1 3.8  HGB 9.9* 9.0*  HCT 29.8* 26.8*  MCV 87.9 89.0  PLT 195 789   Basic Metabolic Panel: Recent Labs  Lab 08/24/17 1329 08/29/17 1622  NA 136 138  K 3.1* 2.7*  CL 92* 97*  CO2 31 28  GLUCOSE 202* 159*  BUN 15 8  CREATININE 1.10* 0.94  CALCIUM 9.4 8.2*   GFR: Estimated Creatinine Clearance: 61.7 mL/min (by C-G formula based on SCr of 0.94 mg/dL). Liver Function Tests: Recent Labs  Lab 08/24/17 1329 08/29/17 1622  AST 10* 12*  ALT <6 8  ALKPHOS 102 74  BILITOT 0.5 1.0  PROT 7.0 6.0*  ALBUMIN 2.9* 2.5*   Recent Labs  Lab 08/29/17 1622  LIPASE 18   No results for input(s): AMMONIA in the last 168 hours. Coagulation Profile: No results for input(s): INR, PROTIME in the last 168 hours. Cardiac Enzymes: No results for input(s): CKTOTAL,  CKMB, CKMBINDEX, TROPONINI in the last 168 hours. BNP (last 3 results) No results for input(s): PROBNP in the last 8760 hours. HbA1C: No results for input(s): HGBA1C in  the last 72 hours. CBG: Recent Labs  Lab 08/29/17 1538  GLUCAP 154*   Lipid Profile: No results for input(s): CHOL, HDL, LDLCALC, TRIG, CHOLHDL, LDLDIRECT in the last 72 hours. Thyroid Function Tests: No results for input(s): TSH, T4TOTAL, FREET4, T3FREE, THYROIDAB in the last 72 hours. Anemia Panel: No results for input(s): VITAMINB12, FOLATE, FERRITIN, TIBC, IRON, RETICCTPCT in the last 72 hours. Urine analysis:    Component Value Date/Time   COLORURINE YELLOW 04/08/2017 1125   APPEARANCEUR CLEAR 04/08/2017 1125   LABSPEC 1.030 04/08/2017 1125   PHURINE 6.0 04/08/2017 1125   GLUCOSEU >=500 (A) 04/08/2017 1125   HGBUR MODERATE (A) 04/08/2017 1125   HGBUR negative 02/06/2009 0915   BILIRUBINUR NEGATIVE 04/08/2017 1125   BILIRUBINUR 1+ 10/29/2016 1203   KETONESUR 5 (A) 04/08/2017 1125   PROTEINUR 30 (A) 04/08/2017 1125   UROBILINOGEN 0.2 10/29/2016 1203   UROBILINOGEN 0.2 05/20/2014 0809   NITRITE NEGATIVE 04/08/2017 1125   LEUKOCYTESUR MODERATE (A) 04/08/2017 1125   Sepsis Labs: !!!!!!!!!!!!!!!!!!!!!!!!!!!!!!!!!!!!!!!!!!!! @LABRCNTIP (procalcitonin:4,lacticidven:4) )No results found for this or any previous visit (from the past 240 hour(s)).   Radiological Exams on Admission: Dg Abdomen Acute W/chest  Result Date: 08/29/2017 CLINICAL DATA:  Nausea and vomiting for 3 weeks.  History of cancer. EXAM: DG ABDOMEN ACUTE W/ 1V CHEST COMPARISON:  Abdominal radiograph July 12, 2017 FINDINGS: Cardiomediastinal silhouette is normal. Single-lumen RIGHT chest Port-A-Cath distal tip projecting in proximal superior vena cava. Lungs are clear, no pleural effusions. No pneumothorax. Soft tissue planes and included osseous structures are non suspicious. Mildly elevated RIGHT hemidiaphragm. Bowel gas pattern is nondilated and  nonobstructive. No significant retained large bowel stool volume. 15 mm linear metallic foreign body projecting at LEFT iliac crest on single view. Phleboliths project in the pelvis. No intra-abdominal mass effect, pathologic calcifications or free air. Soft tissue planes and included osseous structures are non-suspicious. IMPRESSION: 1. No acute cardiopulmonary process. 2. Normal bowel gas pattern. 3. Needle-like fragment projecting over LEFT iliac crest on single view, potentially external to patient. Electronically Signed   By: Elon Alas M.D.   On: 08/29/2017 16:43    Assessment/Plan Principal Problem:   Dehydration Active Problems:   Essential hypertension   Depression   Endometrial cancer (HCC)   Generalized anxiety disorder   Nausea with vomiting   Dehydration secondary to intractable nausea and vomiting -IVF  -Antiemetics and supportive care  Hypokalemia secondary to GI loss -Replaced in ED 64mEq -Trend BMP  -Check Mg   Endometrial cancer -Follows with Dr. Alvy Bimler  DM -Sliding scale insulin, continue Neurontin  HTN -Hold Hyzaar in setting of dehydration  Depression/anxiety  -Continue Xanax, Cymbalta   DVT prophylaxis: Lovenox Code Status: Full  Family Communication: No family at bedside  Disposition Plan: Pending improvement in symptoms  Consults called: None added Dr. Alvy Bimler to treatment care team Admission status: Observation, med-surg    Dessa Phi, DO [facility] [email or web address] Password [email or web address] 08/29/2017, 6:02 PM

## 2017-08-29 NOTE — ED Notes (Signed)
Bed: WA09 Expected date:  Expected time:  Means of arrival:  Comments: EMS 

## 2017-08-29 NOTE — ED Provider Notes (Addendum)
Emergency Department Provider Note   I have reviewed the triage vital signs and the nursing notes.   HISTORY  Chief Complaint Anorexia; Nausea; and Emesis   HPI Katie Palmer is a 67 y.o. female with PMH of HTN, Fibromyalgia, IDDM, and endometrial adenocarcinoma with last chemotherapy 3.5 weeks ago presents to the emergency department with severe nausea, anorexia, and vomiting.  She is managed by her oncologist and home health with home IV fluids and Phenergan.  Patient states that home health came to access her port yesterday and had significant difficulty.  They were unable to give the Phenergan IV and the patient believes that the IV fluids were not infusing properly into the port.  She has had some pain in this area.  She denies noticing any fevers or chills at home.  No shortness of breath or coughing.  She does have some mild abdominal discomfort and continues to have bowel movements.  Denies diarrhea.  Husband states the patient has not eaten anything for several days and is having unintentional weight loss.   Past Medical History:  Diagnosis Date  . Depression   . Fibromyalgia   . Headache(784.0)   . History of colon polyps   . History of diabetes with ketoacidosis   . Hx of colonic polyps   . Hypertension   . Insomnia   . Insulin pump in place   . RBBB (right bundle branch block)   . RLQ abdominal pain   . Thickened endometrium   . Type 1 diabetes mellitus with [place]-term current use of insulin [facility]) endocrinologist-- dr Chalmers Cater   July 2015--  pt took Invokana and caused complete pancreas shut-down , pt went from pre-diabetic to adult type 1 dm now insulin dependent  . Vertigo     Patient Active Problem List   Diagnosis Date Noted  . Nausea with vomiting 08/26/2017  . Drug-induced hypotension 07/30/2017  . Generalized weakness 07/27/2017  . Elevated serum creatinine 07/07/2017  . Generalized anxiety disorder 07/07/2017  . Other constipation 07/07/2017  . Acute  leg pain, left 04/29/2017  . Left leg pain 04/29/2017  . Hearing loss in right ear 04/16/2017  . Obesity, morbid ([place]) 04/15/2017  . Endometrial cancer ([place]) 04/15/2017  . Endometrial adenocarcinoma ([place]) 03/23/2017  . Vertigo 10/29/2016  . Memory loss 10/29/2016  . Posterior tibial tendinitis, right leg 10/07/2016  . Vitamin D deficiency 01/07/2016  . Hypertension 06/03/2015  . 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 DKA, type 1 ([place]) 05/20/2014  . Depression 05/20/2014  . AKI (acute kidney injury) ([place]) 05/20/2014  . Dyslipidemia 04/12/2014  . Leukocytosis 07/28/2013  . Dehydration 07/28/2013  . Fibromyalgia 05/27/2011  . Essential hypertension 12/15/2006  . INSOMNIA 12/15/2006  . HEADACHE 12/15/2006  . CHEST PAIN 12/15/2006  . COLONIC POLYPS, HX OF 12/15/2006    Past Surgical History:  Procedure Laterality Date  . CARDIOVASCULAR STRESS TEST  07/26/2015   normal myocardial perfusion study w/ no ischemia/  normal LV function and wall motion , ef 71%  . COLONOSCOPY WITH PROPOFOL  last one 10/ 2017  . HYSTEROSCOPY W/D&C N/A 12/25/2015   Procedure: DILATATION AND CURETTAGE /HYSTEROSCOPY;  Surgeon: Bobbye Charleston, MD;  Location: [facility];  Service: Gynecology;  Laterality: N/A;  . IR IMAGING GUIDED PORT INSERTION  07/07/2017  . IR RADIOLOGIST EVAL & MGMT  05/06/2017  . IR RADIOLOGIST EVAL & MGMT  05/18/2017  . IR RADIOLOGIST EVAL & MGMT  05/13/2017  .  IR RADIOLOGIST EVAL & MGMT  06/03/2017  . IR RADIOLOGIST EVAL & MGMT  06/09/2017  . IR RADIOLOGIST EVAL & MGMT  06/24/2017  . LAPAROSCOPIC LYSIS OF ADHESIONS N/A 12/25/2015   Procedure: LAPAROSCOPIC LYSIS OF ADHESIONS;  Surgeon: Bobbye Charleston, MD;  Location: [place];  Service: Gynecology;  Laterality: N/A;  . LAPAROSCOPIC UNILATERAL SALPINGO OOPHERECTOMY Right 12/25/2015   Procedure: LAPAROSCOPIC UNILATERAL SALPINGO OOPHORECTOMY;  Surgeon: Bobbye Charleston, MD;  Location: [place];  Service: Gynecology;  Laterality: Right;  . LAPAROSCOPY N/A 12/25/2015   Procedure: LAPAROSCOPY OPERATIVE;  Surgeon: Bobbye Charleston, MD;  Location: [facility];  Service: Gynecology;  Laterality: N/A;  . LYMPH NODE BIOPSY N/A 04/15/2017   Procedure: SENTINEL LYMPH NODE BIOPSY, PERIAORTIC LYMPH NODES, AND VAGINAL BIOPSY;  Surgeon: Everitt Amber, MD;  Location: [facility];  Service: Gynecology;  Laterality: N/A;  . ROBOTIC ASSISTED TOTAL HYSTERECTOMY WITH BILATERAL SALPINGO OOPHERECTOMY Bilateral 04/15/2017   Procedure: XI ROBOTIC ASSISTED TOTAL LAPROSCOPIC  HYSTERECTOMY WITH LEFT SALPINGO OOPHORECTOMY;  Surgeon: Everitt Amber, MD;  Location: [facility];  Service: Gynecology;  Laterality: Bilateral;  . TONSILLECTOMY  age 25  . TRANSTHORACIC ECHOCARDIOGRAM  07/26/2015   grade 1 diastolic dysfunction , ef 60-65%/  trivial TR    Allergies Armodafinil; Invokana [canagliflozin]; Lasix [furosemide]; and Adhesive [tape]  Family History  Problem Relation Age of Onset  . Alcohol abuse Father        Died age 42  . Heart disease Father   . Aneurysm Mother        Died age 105  . Leukemia Brother        Died age 22  . COPD Sister   . Rheum arthritis Sister   . Heart failure Sister   . Dementia Maternal Uncle   . Colon cancer Neg Hx     Social History Social History   Tobacco Use  . Smoking status: Never Smoker  . Smokeless tobacco: Never Used  Substance Use Topics  . Alcohol use: No    Alcohol/week: 0.0 oz  . Drug use: No    Review of Systems  Constitutional: No fever/chills Eyes: No visual changes. ENT: No sore throat. Cardiovascular: Denies chest pain. Respiratory: Denies shortness of breath. Gastrointestinal: Positive mild abdominal pain. Positive nausea and vomiting.  No diarrhea.  No constipation. Genitourinary: Negative for dysuria. Musculoskeletal: Negative for back pain. Skin: Negative for rash. Neurological: Negative  for headaches, focal weakness or numbness.  10-point ROS otherwise negative.  ____________________________________________   PHYSICAL EXAM:  VITAL SIGNS: ED Triage Vitals  Enc Vitals Group     BP 08/29/17 1529 133/86     Pulse Rate 08/29/17 1529 80     Resp 08/29/17 1529 18     Temp 08/29/17 1529 100 F (37.8 C)     Temp Source 08/29/17 1529 Oral     SpO2 08/29/17 1529 99 %     Pain Score 08/29/17 1528 5   Constitutional: Alert and oriented. Well appearing and in no acute distress. Appears chronically unwell and slightly pale.  Eyes: Conjunctivae are normal.  Head: Atraumatic. Nose: No congestion/rhinnorhea. Mouth/Throat: Mucous membranes are dry. Oropharynx non-erythematous. Neck: No stridor.  Cardiovascular: Normal rate, regular rhythm. Good peripheral circulation. Grossly normal heart sounds.   Respiratory: Normal respiratory effort.  No retractions. Lungs CTAB. Gastrointestinal: Soft and nontender. No distention.  Musculoskeletal: No lower extremity tenderness nor edema. No gross deformities of extremities. Neurologic:  Normal speech and language. No gross focal  neurologic deficits are appreciated.  Skin:  Skin is warm, dry and intact. No rash noted.  ____________________________________________   LABS (all labs ordered are listed, but only abnormal results are displayed)  Labs Reviewed  COMPREHENSIVE METABOLIC PANEL - Abnormal; Notable for the following components:      Result Value   Potassium 2.7 (*)    Chloride 97 (*)    Glucose, Bld 159 (*)    Calcium 8.2 (*)    Total Protein 6.0 (*)    Albumin 2.5 (*)    AST 12 (*)    All other components within normal limits  CBC WITH DIFFERENTIAL/PLATELET - Abnormal; Notable for the following components:   RBC 3.01 (*)    Hemoglobin 9.0 (*)    HCT 26.8 (*)    RDW 19.9 (*)    All other components within normal limits  URINALYSIS, ROUTINE W REFLEX MICROSCOPIC - Abnormal; Notable for the following components:    Ketones, ur 20 (*)    All other components within normal limits  CBC - Abnormal; Notable for the following components:   RBC 2.71 (*)    Hemoglobin 8.1 (*)    HCT 24.5 (*)    RDW 20.7 (*)    All other components within normal limits  BASIC METABOLIC PANEL - Abnormal; Notable for the following components:   Potassium 3.2 (*)    Glucose, Bld 164 (*)    BUN 6 (*)    Calcium 8.0 (*)    All other components within normal limits  GLUCOSE, CAPILLARY - Abnormal; Notable for the following components:   Glucose-Capillary 113 (*)    All other components within normal limits  GLUCOSE, CAPILLARY - Abnormal; Notable for the following components:   Glucose-Capillary 122 (*)    All other components within normal limits  GLUCOSE, CAPILLARY - Abnormal; Notable for the following components:   Glucose-Capillary 171 (*)    All other components within normal limits  CBG MONITORING, ED - Abnormal; Notable for the following components:   Glucose-Capillary 154 (*)    All other components within normal limits  URINE CULTURE  CULTURE, BLOOD (ROUTINE X 2)  CULTURE, BLOOD (ROUTINE X 2)  LIPASE, BLOOD  MAGNESIUM  GLUCOSE, CAPILLARY  HIV ANTIBODY (ROUTINE TESTING)  I-STAT CG4 LACTIC ACID, ED  I-STAT CG4 LACTIC ACID, ED   ____________________________________________  RADIOLOGY  Dg Abdomen Acute W/chest  Result Date: 08/29/2017 CLINICAL DATA:  Nausea and vomiting for 3 weeks.  History of cancer. EXAM: DG ABDOMEN ACUTE W/ 1V CHEST COMPARISON:  Abdominal radiograph July 12, 2017 FINDINGS: Cardiomediastinal silhouette is normal. Single-lumen RIGHT chest Port-A-Cath distal tip projecting in proximal superior vena cava. Lungs are clear, no pleural effusions. No pneumothorax. Soft tissue planes and included osseous structures are non suspicious. Mildly elevated RIGHT hemidiaphragm. Bowel gas pattern is nondilated and nonobstructive. No significant retained large bowel stool volume. 15 mm linear metallic foreign  body projecting at LEFT iliac crest on single view. Phleboliths project in the pelvis. No intra-abdominal mass effect, pathologic calcifications or free air. Soft tissue planes and included osseous structures are non-suspicious. IMPRESSION: 1. No acute cardiopulmonary process. 2. Normal bowel gas pattern. 3. Needle-like fragment projecting over LEFT iliac crest on single view, potentially external to patient. Electronically Signed   By: Elon Alas M.D.   On: 08/29/2017 16:43    ____________________________________________   PROCEDURES  Procedure(s) performed:   Procedures  None ____________________________________________   INITIAL IMPRESSION / ASSESSMENT AND PLAN / ED COURSE  Pertinent  labs & imaging results that were available during my care of the patient were reviewed by me and considered in my medical decision making (see chart for details).  She presents to the emergency department for evaluation of nausea, vomiting, dehydration, anorexia.  Her chemotherapy was last given 3.5 weeks ago and the course was abandoned due to significant side effects.  She is set up for home health to administer IV fluids and Phenergan.  They saw her on Friday but there is some concern that the fluids were not adequately infused and there was difficulty accessing the port.  Patient does have a borderline fever here without tachycardia.  Doubt sepsis but plan for labs, cultures, plain film of the belly and chest, reassess.  Potassium replaced both IV and PO. No AKI. Plan for admission for IVF and nausea/vomiting control.   Discussed patient's case with Hospitalist to request admission. Patient and family (if present) updated with plan. Care transferred to Hospitalist service.  I reviewed all nursing notes, vitals, pertinent old records, EKGs, labs, imaging (as available).  ____________________________________________  FINAL CLINICAL IMPRESSION(S) / ED DIAGNOSES  Final diagnoses:  Hypokalemia    Dehydration    MEDICATIONS GIVEN DURING THIS VISIT:  Medications  ALPRAZolam (XANAX) tablet 0.5-1 mg (1 mg Oral Given 08/29/17 2117)  DULoxetine (CYMBALTA) DR capsule 20 mg (20 mg Oral Given 08/30/17 1005)  ezetimibe (ZETIA) tablet 10 mg (10 mg Oral Given 08/29/17 2117)  gabapentin (NEURONTIN) capsule 200 mg (200 mg Oral Given 08/30/17 1005)  traMADol (ULTRAM) tablet 50 mg (50 mg Oral Given 08/30/17 1010)  traZODone (DESYREL) tablet 100 mg (100 mg Oral Given 08/29/17 2117)  enoxaparin (LOVENOX) injection 40 mg (40 mg Subcutaneous Given 08/29/17 2118)  0.9 %  sodium chloride infusion ( Intravenous IV Pump Association 08/30/17 0732)  acetaminophen (TYLENOL) tablet 650 mg (has no administration in time range)    Or  acetaminophen (TYLENOL) suppository 650 mg (has no administration in time range)  polyethylene glycol (MIRALAX / GLYCOLAX) packet 17 g (has no administration in time range)  ondansetron (ZOFRAN) tablet 4 mg (has no administration in time range)    Or  ondansetron (ZOFRAN) injection 4 mg (has no administration in time range)  MEDLINE mouth rinse (15 mLs Mouth Rinse Not Given 08/30/17 1006)  cholecalciferol (VITAMIN D) tablet 400 Units (has no administration in time range)  insulin pump (has no administration in time range)  sodium chloride 0.9 % bolus 1,000 mL (0 mLs Intravenous Stopped 08/30/17 0600)  promethazine (PHENERGAN) injection 12.5 mg (12.5 mg Intravenous Given 08/29/17 1632)  acetaminophen (TYLENOL) tablet 1,000 mg (1,000 mg Oral Given 08/29/17 1630)  potassium chloride 10 mEq in 100 mL IVPB (0 mEq Intravenous Stopped 08/29/17 2101)  potassium chloride SA (K-DUR,KLOR-CON) CR tablet 40 mEq (40 mEq Oral Given 08/30/17 1005)    Note:  This document was prepared using Dragon voice recognition software and may include unintentional dictation errors.  Nanda Quinton, MD Emergency Medicine    Anniebell Bedore, [facility], MD 08/30/17 1100    Taym Twist, [facility], MD 08/30/17 [phone]

## 2017-08-30 ENCOUNTER — Encounter: Payer: Self-pay | Admitting: Oncology

## 2017-08-30 DIAGNOSIS — C7989 Secondary malignant neoplasm of other specified sites: Secondary | ICD-10-CM | POA: Diagnosis present

## 2017-08-30 DIAGNOSIS — Z515 Encounter for palliative care: Secondary | ICD-10-CM | POA: Diagnosis not present

## 2017-08-30 DIAGNOSIS — F411 Generalized anxiety disorder: Secondary | ICD-10-CM | POA: Diagnosis present

## 2017-08-30 DIAGNOSIS — R112 Nausea with vomiting, unspecified: Secondary | ICD-10-CM | POA: Diagnosis not present

## 2017-08-30 DIAGNOSIS — D630 Anemia in neoplastic disease: Secondary | ICD-10-CM | POA: Diagnosis not present

## 2017-08-30 DIAGNOSIS — E1139 Type 2 diabetes mellitus with other diabetic ophthalmic complication: Secondary | ICD-10-CM

## 2017-08-30 DIAGNOSIS — G893 Neoplasm related pain (acute) (chronic): Secondary | ICD-10-CM | POA: Diagnosis present

## 2017-08-30 DIAGNOSIS — Z794 Long term (current) use of insulin: Secondary | ICD-10-CM | POA: Diagnosis not present

## 2017-08-30 DIAGNOSIS — C786 Secondary malignant neoplasm of retroperitoneum and peritoneum: Secondary | ICD-10-CM | POA: Diagnosis present

## 2017-08-30 DIAGNOSIS — C800 Disseminated malignant neoplasm, unspecified: Secondary | ICD-10-CM | POA: Diagnosis not present

## 2017-08-30 DIAGNOSIS — N1330 Unspecified hydronephrosis: Secondary | ICD-10-CM | POA: Diagnosis not present

## 2017-08-30 DIAGNOSIS — E860 Dehydration: Principal | ICD-10-CM

## 2017-08-30 DIAGNOSIS — D49511 Neoplasm of unspecified behavior of right kidney: Secondary | ICD-10-CM | POA: Diagnosis not present

## 2017-08-30 DIAGNOSIS — I10 Essential (primary) hypertension: Secondary | ICD-10-CM | POA: Diagnosis present

## 2017-08-30 DIAGNOSIS — M797 Fibromyalgia: Secondary | ICD-10-CM | POA: Diagnosis present

## 2017-08-30 DIAGNOSIS — K5900 Constipation, unspecified: Secondary | ICD-10-CM | POA: Diagnosis present

## 2017-08-30 DIAGNOSIS — E1040 Type 1 diabetes mellitus with diabetic neuropathy, unspecified: Secondary | ICD-10-CM | POA: Diagnosis present

## 2017-08-30 DIAGNOSIS — E1011 Type 1 diabetes mellitus with ketoacidosis with coma: Secondary | ICD-10-CM | POA: Diagnosis not present

## 2017-08-30 DIAGNOSIS — C541 Malignant neoplasm of endometrium: Secondary | ICD-10-CM | POA: Diagnosis present

## 2017-08-30 DIAGNOSIS — R630 Anorexia: Secondary | ICD-10-CM | POA: Diagnosis present

## 2017-08-30 DIAGNOSIS — R4182 Altered mental status, unspecified: Secondary | ICD-10-CM | POA: Diagnosis not present

## 2017-08-30 DIAGNOSIS — I82220 Acute embolism and thrombosis of inferior vena cava: Secondary | ICD-10-CM | POA: Diagnosis not present

## 2017-08-30 DIAGNOSIS — E43 Unspecified severe protein-calorie malnutrition: Secondary | ICD-10-CM | POA: Diagnosis present

## 2017-08-30 DIAGNOSIS — Z7951 Long term (current) use of inhaled steroids: Secondary | ICD-10-CM | POA: Diagnosis not present

## 2017-08-30 DIAGNOSIS — I82429 Acute embolism and thrombosis of unspecified iliac vein: Secondary | ICD-10-CM | POA: Diagnosis not present

## 2017-08-30 DIAGNOSIS — D638 Anemia in other chronic diseases classified elsewhere: Secondary | ICD-10-CM | POA: Diagnosis present

## 2017-08-30 DIAGNOSIS — Z66 Do not resuscitate: Secondary | ICD-10-CM | POA: Diagnosis present

## 2017-08-30 DIAGNOSIS — F329 Major depressive disorder, single episode, unspecified: Secondary | ICD-10-CM | POA: Diagnosis present

## 2017-08-30 DIAGNOSIS — N131 Hydronephrosis with ureteral stricture, not elsewhere classified: Secondary | ICD-10-CM | POA: Diagnosis present

## 2017-08-30 DIAGNOSIS — Z6830 Body mass index (BMI) 30.0-30.9, adult: Secondary | ICD-10-CM | POA: Diagnosis not present

## 2017-08-30 DIAGNOSIS — Z7189 Other specified counseling: Secondary | ICD-10-CM | POA: Diagnosis not present

## 2017-08-30 DIAGNOSIS — C679 Malignant neoplasm of bladder, unspecified: Secondary | ICD-10-CM | POA: Diagnosis not present

## 2017-08-30 DIAGNOSIS — E0811 Diabetes mellitus due to underlying condition with ketoacidosis with coma: Secondary | ICD-10-CM | POA: Diagnosis not present

## 2017-08-30 DIAGNOSIS — R531 Weakness: Secondary | ICD-10-CM

## 2017-08-30 DIAGNOSIS — E876 Hypokalemia: Secondary | ICD-10-CM | POA: Diagnosis present

## 2017-08-30 LAB — CBC
HEMATOCRIT: 24.5 % — AB (ref 36.0–46.0)
HEMOGLOBIN: 8.1 g/dL — AB (ref 12.0–15.0)
MCH: 29.9 pg (ref 26.0–34.0)
MCHC: 33.1 g/dL (ref 30.0–36.0)
MCV: 90.4 fL (ref 78.0–100.0)
PLATELETS: 226 10*3/uL (ref 150–400)
RBC: 2.71 MIL/uL — ABNORMAL LOW (ref 3.87–5.11)
RDW: 20.7 % — ABNORMAL HIGH (ref 11.5–15.5)
WBC: 4.4 10*3/uL (ref 4.0–10.5)

## 2017-08-30 LAB — URINALYSIS, ROUTINE W REFLEX MICROSCOPIC
Bilirubin Urine: NEGATIVE
Glucose, UA: NEGATIVE mg/dL
HGB URINE DIPSTICK: NEGATIVE
Ketones, ur: 20 mg/dL — AB
LEUKOCYTES UA: NEGATIVE
Nitrite: NEGATIVE
Protein, ur: NEGATIVE mg/dL
SPECIFIC GRAVITY, URINE: 1.008 (ref 1.005–1.030)
pH: 6 (ref 5.0–8.0)

## 2017-08-30 LAB — GLUCOSE, CAPILLARY
GLUCOSE-CAPILLARY: 171 mg/dL — AB (ref 70–99)
Glucose-Capillary: 113 mg/dL — ABNORMAL HIGH (ref 70–99)
Glucose-Capillary: 122 mg/dL — ABNORMAL HIGH (ref 70–99)
Glucose-Capillary: 148 mg/dL — ABNORMAL HIGH (ref 70–99)
Glucose-Capillary: 120 mg/dL — ABNORMAL HIGH (ref 70–99)
Glucose-Capillary: 166 mg/dL — ABNORMAL HIGH (ref 70–99)

## 2017-08-30 LAB — BASIC METABOLIC PANEL
ANION GAP: 9 (ref 5–15)
BUN: 6 mg/dL — ABNORMAL LOW (ref 8–23)
CHLORIDE: 105 mmol/L (ref 98–111)
CO2: 29 mmol/L (ref 22–32)
Calcium: 8 mg/dL — ABNORMAL LOW (ref 8.9–10.3)
Creatinine, Ser: 0.85 mg/dL (ref 0.44–1.00)
GFR calc Af Amer: >60 mL/min (ref >60)
GLUCOSE: 164 mg/dL — AB (ref 70–99)
POTASSIUM: 3.2 mmol/L — AB (ref 3.5–5.1)
Sodium: 143 mmol/L (ref 135–145)

## 2017-08-30 LAB — HIV ANTIBODY (ROUTINE TESTING W REFLEX): HIV Screen 4th Generation wRfx: NONREACTIVE

## 2017-08-30 MED ORDER — ORAL CARE MOUTH RINSE
15.0000 mL | Freq: Two times a day (BID) | OROMUCOSAL | Status: DC
  Administered 2017-08-30 – 2017-09-02 (×4): 15 mL via OROMUCOSAL

## 2017-08-30 MED ORDER — BOOST / RESOURCE BREEZE PO LIQD CUSTOM
1.0000 | Freq: Three times a day (TID) | ORAL | Status: DC
  Administered 2017-08-30 – 2017-09-02 (×7): 1 via ORAL

## 2017-08-30 MED ORDER — CHOLECALCIFEROL 10 MCG (400 UNIT) PO TABS
400.0000 [IU] | ORAL_TABLET | Freq: Every day | ORAL | Status: DC
  Administered 2017-08-30 – 2017-08-31 (×2): 400 [IU] via ORAL
  Filled 2017-08-30 (×3): qty 1

## 2017-08-30 MED ORDER — POTASSIUM CHLORIDE CRYS ER 20 MEQ PO TBCR
40.0000 meq | EXTENDED_RELEASE_TABLET | Freq: Once | ORAL | Status: AC
  Administered 2017-08-30: 40 meq via ORAL
  Filled 2017-08-30: qty 2

## 2017-08-30 MED ORDER — INSULIN PUMP
Freq: Three times a day (TID) | SUBCUTANEOUS | Status: DC
  Administered 2017-08-30: 14:00:00 via SUBCUTANEOUS
  Administered 2017-08-31: 8.4 via SUBCUTANEOUS
  Administered 2017-08-31: 19 via SUBCUTANEOUS
  Administered 2017-08-31: 4.3 via SUBCUTANEOUS
  Administered 2017-08-31: 19 via SUBCUTANEOUS
  Administered 2017-09-01: 3.4 via SUBCUTANEOUS
  Administered 2017-09-01 (×2): via SUBCUTANEOUS
  Administered 2017-09-01: 4.5 via SUBCUTANEOUS
  Administered 2017-09-01: 18:00:00 via SUBCUTANEOUS
  Administered 2017-09-02: 1.69 via SUBCUTANEOUS
  Filled 2017-08-30: qty 1

## 2017-08-30 MED ORDER — POLYETHYLENE GLYCOL 3350 17 G PO PACK
17.0000 g | PACK | Freq: Every day | ORAL | Status: DC
  Administered 2017-08-30 – 2017-08-31 (×2): 17 g via ORAL
  Filled 2017-08-30 (×2): qty 1

## 2017-08-30 MED ORDER — TRAMADOL HCL 50 MG PO TABS
100.0000 mg | ORAL_TABLET | Freq: Four times a day (QID) | ORAL | Status: DC | PRN
  Administered 2017-08-30 – 2017-08-31 (×3): 100 mg via ORAL
  Filled 2017-08-30 (×3): qty 2

## 2017-08-30 MED ORDER — SENNOSIDES-DOCUSATE SODIUM 8.6-50 MG PO TABS
2.0000 | ORAL_TABLET | Freq: Two times a day (BID) | ORAL | Status: DC
  Administered 2017-08-30 – 2017-09-02 (×7): 2 via ORAL
  Filled 2017-08-30 (×7): qty 2

## 2017-08-30 MED ORDER — TRAMADOL HCL 50 MG PO TABS: 100.0000 mg | ORAL_TABLET | ORAL | Status: DC | PRN

## 2017-08-30 NOTE — Progress Notes (Signed)
[facility] Infusion Coordinator will follow pt this hospital admission to support home POC at DC.  Home Infusion Orders prior to DC:  NS 1000 ml over 2-4 hours daily with Phenergan 12.5- 25 mg IV Q 24 hours for NV.  If patient discharges after hours, please call [phone].   Larry Sierras 08/30/2017, 10:17 AM

## 2017-08-30 NOTE — Progress Notes (Signed)
[facility]  Patient Status: Active (receiving services up to time of hospitalization)  [facility] is providing the following services: RN for IV hydration.  If patient discharges after hours, please call [phone].   [name] 08/30/2017, 9:57 AM

## 2017-08-30 NOTE — Progress Notes (Signed)
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[place]) She has uncontrolled diabetes with each dose of chemotherapy despite medication adjustment For now, I recommend she continues close follow-up with her endocrinologist for manage ment She will continue close monitoring and insulin as needed  Dehydration She had recent profound weight loss secondary to dehydration due to nausea from treatment I recommend IV fluid support at home through advanced home care agency daily, to resume upon discharge  Nausea with vomiting She had recent uncontrolled nausea vomiting She felt that she tolerated Phenergan well We will continue Phenergan as needed along with IV fluid support and IV antiemetics as needed  Severe constipation I will order scheduled MiraLAX and Senokot If her symptoms do not improve, will consider repeat CT scan of the abdomen and pelvis  Generalized weakness She has profound generalized weakness due to dehydration and poor oral intake Due to her debility and immunocompromise state, we will provide services through advanced home care agency  Discharge planning Will reassess daily If not improved by tomorrow, will consider CT scan of the abdomen and pelvis to rule out cancer recurrence I will return to check on her tomorrow  Heath Lark, MD 08/30/2017  12:52 PM   Subjective:  Patient is well-known to me.  She was admitted to the hospital due to uncontrolled nausea and vomiting. Since aggressive IV fluid resuscitation and IV antiemetics, her symptoms has improved.   She is constipated for 3 days She continues to have minimum oral intake  Oncology History   High grade carcinosarcoma     Endometrial cancer ([place])   12/25/2015 Pathology Results    1. Ovary and fallopian tube, right - BENIGN OVARIAN FIBROTHECOMA. - NO ENDOMETRIOSIS OR MALIGNANCY. - BENIGN FALLOPIAN TUBE WITH BENIGN PARATUBAL CYST. 2. Endometrium, curettage - INACTIVE ENDOMETRIUM, BENIGN ENDOMETRIAL POLYP AND BENIGN SQUAMOUS FRAGMENTS. - NO HYPERPLASIA OR MALIGNANCY      12/25/2015 Surgery    PRE-OPERATIVE DIAGNOSIS:  RLQ PAIN, thickened endometrium  POST-OPERATIVE DIAGNOSIS:  R O lesion, adhesions, polyp in uterus  PROCEDURE:  Procedure(s): DILATATION AND CURETTAGE /HYSTEROSCOPY (N/A) LAPAROSCOPY OPERATIVE (N/A) LAPAROSCOPIC LYSIS OF ADHESIONS (N/A) LAPAROSCOPIC UNILATERAL SALPINGO OOPHORECTOMY (Right        01/25/2017 Initial Diagnosis    The patient reports postmenopausal bleeding since December, 2018. Pap smear showed AGUS.         03/17/2017 Pathology Results    Endometrial biopsy on 03/17/17 showed grade 3 endometrial adenocarcinoma.      03/26/2017 Imaging    Ct abdomen Diffuse endometrial thickening consistent with endometrial carcinoma, with probable focal area of deep myometrial invasion in the anterior fundus.  No evidence of extra uterine tumor extension, lymphadenopathy, or distant metastatic disease.  Two tiny bilateral lower lobe pulmonary nodules, largest measuring 5 mm. These are indeterminate, but likely benign. Recommend follow-up with chest CT without contrast in 3-6 months.      04/15/2017 Surgery    Operation: Robotic-assisted laparoscopic total hysterectomy with left salpingoophorectomy, SLN biopsy, para-aortic lymphadenectomy (bilateral), vaginal biopsy.  Surgeon: Donaciano Eva  Operative Findings:  : 54mm nodule on left proximal (upper) vaginal side wall (  benign on frozen). 8cm normal appearing uterus, surgically absent right  ovary and tube, normal left ovary somewhat adherent to round ligament. Indurated left external iliac SLN. No other suspicious nodes or apparent extrauterine disease        04/15/2017 Pathology Results    1. Vagina, biopsy - ATYPICAL CELLS, SUSPICIOUS BUT NOT DIAGNOSTIC OF MALIGNANCY. SEE NOTE 2. Lymph node, sentinel, biopsy, right presacral #1 - LYMPH NODE, NEGATIVE FOR MALIGNANCY (0/1) - IMMUNOSTAIN FOR PANKERATIN SHOWS FOCAL NONSPECIFIC LABELING 3. Lymph node, sentinel, biopsy, right presacral #2 - LYMPH NODE, NEGATIVE FOR MALIGNANCY (0/1) - IMMUNOSTAIN FOR PANKERATIN IS NEGATIVE 4. Lymph node, sentinel, biopsy, left external iliac - LYMPH NODE, NEGATIVE FOR MALIGNANCY (0/1) - IMMUNOSTAIN FOR PANKERATIN IS NEGATIVE 5. Lymph node, biopsy, right periaortic - TWO LYMPH NODES, NEGATIVE FOR MALIGNANCY (0/2) 6. Lymph node, biopsy, left periaortic - SEVEN LYMPH NODES, NEGATIVE FOR MALIGNANCY (0/7) 7. 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 SEE NOTE - TUMOR INVADES A LITTLE OVER HALF OF THE MYOMETRIUM (INVASION DEPTH OF 0.7 CM WHERE MYOMETRIUM IS 1.3 CM THICK) 1. Vagina, biopsy - ATYPICAL CELLS, SUSPICIOUS BUT NOT DIAGNOSTIC OF MALIGNANCY. SEE NOTE 2. Lymph node, sentinel, biopsy, right presacral #1 - LYMPH NODE, NEGATIVE FOR MALIGNANCY (0/1) - IMMUNOSTAIN FOR PANKERATIN SHOWS FOCAL NONSPECIFIC LABELING 3. Lymph node, sentinel, biopsy, right presacral #2 - LYMPH NODE, NEGATIVE FOR MALIGNANCY (0/1) - IMMUNOSTAIN FOR PANKERATIN IS NEGATIVE 4. Lymph node, sentinel, biopsy, left external iliac - LYMPH NODE, NEGATIVE FOR MALIGNANCY (0/1) - IMMUNOSTAIN FOR PANKERATIN IS NEGATIVE 5. Lymph node, biopsy, right periaortic - TWO LYMPH NODES, NEGATIVE FOR MALIGNANCY (0/2) 6. Lymph node, biopsy, left periaortic - SEVEN LYMPH NODES, NEGATIVE FOR MALIGNANCY  (0/7) 7. 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 SEE NOTE - TUMOR INVADES A LITTLE OVER HALF OF THE MYOMETRIUM (INVASION DEPTH OF 0.7 CM WHERE MYOMETRIUM IS 1.3 CM THICK)  7. ONCOLOGY TABLE-UTERUS, CARCINOMA OR CARCINOSARCOMA 1 of 4 Duplicate copy FINAL for Fisch, Marquita [phone]) Microscopic Comment(continued) Specimen: Uterus with left fallopian tube and ovary Procedure: Total hysterectomy with left salpingoophorectomy Lymph node sampling performed: Sentinel and non-sentinel Specimen integrity: Intact Maximum tumor size: 4.2 cm Histologic type: Carcinosarcoma (Malignant mullerian mixed tumor) Grade: High-grade Myometrial invasion: 0.7 cm where myometrium is 1.3 cm in thickness Cervical stromal involvement: Not identified Extent of involvement of other organs: Not involved Lymph - vascular invasion: Not identified Peritoneal washings: NA Lymph nodes: Examined: 3 Sentinel 9 Non-sentinel 12 Total Lymph nodes with metastasis: 0 Isolated tumor cells (< 0.2 mm): 0 Micrometastasis: (> 0.2 mm and < 2.0 mm): 0 Macrometastasis: (> 2.0 mm): 0 Extracapsular extension: NA TNM code: pT1b, pN0 FIGO Stage (based on pathologic findings, needs clinical correlation): 1B      04/29/2017 Imaging    CT abdomen Multiloculated bilateral retroperitoneal fluid collections, consistent with postoperative lymphoceles.  No evidence of hydronephrosis or other complication      02/01/6158 - 05/02/2017 Hospital Admission    She was admitted for evaluation of severe pain. CT showed fluid collection and IR was consulted. Drain was placed to drain the fluid collection      04/30/2017 Cancer Staging    Staging form: Corpus Uteri - Carcinoma and Carcinosarcoma, AJCC 8th Edition - Pathologic: Stage IB (pT1b, pN0, cM0) - Signed  by Heath Lark, MD on 04/30/2017       04/30/2017 Procedure    Successful abdominal abscess drain x2.      05/06/2017 Imaging    1. Interval retraction of right-sided percutaneous drainage catheter with end now coiled and locked peripheral the grossly unchanged right-sided retroperitoneal fluid collection, presumed postoperative seroma. 2. Unchanged positioning of left-sided percutaneous drainage catheter with end coiled and locked within the peripheral aspect of the grossly unchanged left-sided retroperitoneal fluid collection, presumed postoperative seroma.  PLAN: - The malpositioned right-sided retroperitoneal drainage catheter was removed at the patient's bedside without incident      05/18/2017 Imaging    Right retroperitoneal fluid collections are improved. The right drain has been repositioned into the more anterior fluid collection which is smaller than on the prior study.  The lateral left retroperitoneal fluid collection has resolved after drainage. The more medial and anterior fluid collection has increased in size. The drain is not coiled in this more medial fluid collection.      06/03/2017 Imaging    New left retroperitoneal drain has been placed into a fluid collection. This fluid collection has improved from 4.4 x 4.5 cm to 3.2 x 3.8 cm.  The other left-sided drain remains in place without associated abscess.  The right-sided fluid collection containing a drain has resolved.  No new fluid collection.  Other incidental findings are noted.      06/09/2017 Imaging    Unchanged position of left retroperitoneal pigtail drainage catheter, with decreasing size of residual rim enhancing fluid collection/abscess.  Interval removal of retroperitoneal pigtail drainage catheter from the right.  Mild dilation of the left collecting system and left ureter, with the proximal left ureter inseparable from the inflammatory changes adjacent to the pigtail catheter. I would favor this not to represent ureteral obstruction, but  instead reactive dilation.      06/24/2017 Imaging    Resolution of left-sided retroperitoneal lymphocele at the level of the single remaining retroperitoneal drainage catheter. Small residual lymphoceles are identified in the right retroperitoneum lateral to the right common iliac artery and vein measuring approximately 1.9 cm in greatest diameter and adjacent to the lower pole of the right kidney measuring 9 mm. Two other small residual lymphoceles are seen adjacent to the left external iliac artery measuring 11 mm and 10 mm respectively. All of these residual small collections are far smaller than the original collections present postoperatively on the 04/29/2017 scan and are currently too small to warrant drainage.  The left retroperitoneal drain was removed today. See separately dictated clinic note in Epic.      07/07/2017 Procedure    Successful placement of a right IJ approach Power Port with ultrasound and fluoroscopic guidance. The catheter is ready for use.      07/09/2017 -  Chemotherapy    The patient had carboplatin and taxol        Objective:  Vitals:   08/29/17 2139 08/30/17 0533  BP: (!) 109/54 136/80  Pulse: 69 70  Resp: 18 14  Temp: 98.1 F (36.7 C) 98.4 F (36.9 C)  SpO2: 95% 95%     Intake/Output Summary (Last 24 hours) at 08/30/2017 1252 Last data filed at 08/30/2017 0900 Gross per 24 hour  Intake 3066.06 ml  Output -  Net 3066.06 ml    GENERAL:alert, no distress and comfortable SKIN: skin color, texture, turgor are normal, no rashes or significant lesions EYES: normal, Conjunctiva are pink and non-injected, sclera clear OROPHARYNX:no exudate, no erythema  and lips, buccal mucosa, and tongue normal  NECK: supple, thyroid normal size, non-tender, without nodularity LYMPH:  no palpable lymphadenopathy in the cervical, axillary or inguinal LUNGS: clear to auscultation and percussion with normal breathing effort HEART: regular rate & rhythm and no murmurs  and no lower extremity edema ABDOMEN:abdomen soft, non-tender and normal bowel sounds Musculoskeletal:no cyanosis of digits and no clubbing  NEURO: alert & oriented x 3 with fluent speech, no focal motor/sensory deficits   Labs:  Lab Results  Component Value Date   WBC 4.4 08/30/2017   HGB 8.1 (L) 08/30/2017   HCT 24.5 (L) 08/30/2017   MCV 90.4 08/30/2017   PLT 226 08/30/2017   NEUTROABS 3.8 08/29/2017    Lab Results  Component Value Date   NA 143 08/30/2017   K 3.2 (L) 08/30/2017   CL 105 08/30/2017   CO2 29 08/30/2017    Studies:  Dg Abdomen Acute W/chest  Result Date: 08/29/2017 CLINICAL DATA:  Nausea and vomiting for 3 weeks.  History of cancer. EXAM: DG ABDOMEN ACUTE W/ 1V CHEST COMPARISON:  Abdominal radiograph July 12, 2017 FINDINGS: Cardiomediastinal silhouette is normal. Single-lumen RIGHT chest Port-A-Cath distal tip projecting in proximal superior vena cava. Lungs are clear, no pleural effusions. No pneumothorax. Soft tissue planes and included osseous structures are non suspicious. Mildly elevated RIGHT hemidiaphragm. Bowel gas pattern is nondilated and nonobstructive. No significant retained large bowel stool volume. 15 mm linear metallic foreign body projecting at LEFT iliac crest on single view. Phleboliths project in the pelvis. No intra-abdominal mass effect, pathologic calcifications or free air. Soft tissue planes and included osseous structures are non-suspicious. IMPRESSION: 1. No acute cardiopulmonary process. 2. Normal bowel gas pattern. 3. Needle-like fragment projecting over LEFT iliac crest on single view, potentially external to patient. Electronically Signed   By: Elon Alas M.D.   On: 08/29/2017 16:43

## 2017-08-30 NOTE — Progress Notes (Signed)
PROGRESS NOTE    Katie Palmer  OXB:[id number] DOB: 09/04/1950 DOA: 08/29/2017 PCP: Laurey Morale, MD     Brief Narrative:  Katie Palmer is a 67 y.o. female with medical history significant of endometrial cancer, most recent chemotherapy 3 weeks ago with side effects including uncontrolled nausea, vomiting, dehydration. Chemotherapy has been on hold.  Also with past medical history of diabetes, anxiety and depression, essential hypertension.  She states that she has had some nausea and vomiting since her surgery back in April.  This has been getting worse in the past few weeks after chemotherapy treatment.  She states that she vomits intermittently, twice yesterday and once this morning.  She denies any abdominal pain, diarrhea.  No fevers at home.  Denies any chest pain or shortness of breath or cough.  She states that she has not eaten anything in days. Labs obtained in the emergency department which revealed hypokalemia potassium 2.7.  Chest x-ray and abdominal x-ray obtained, no acute process.  She was given potassium supplementation, IV fluid, Phenergan.  New events last 24 hours / Subjective: No acute events.  She states that she feels a little bit better with IV fluids.  She was able to tolerate about 30% of her meal today.  Assessment & Plan:   Principal Problem:   Dehydration Active Problems:   Essential hypertension   Depression   Endometrial cancer ([facility])   Generalized anxiety disorder   Nausea with vomiting    Dehydration secondary to intractable nausea and vomiting -Antiemetics and supportive care -Improving, continue IV fluids  Hypokalemia secondary to GI loss -Replace, trend  Endometrial cancer -Follows with Dr. Alvy Bimler  DM -Continue home insulin pump, continue Neurontin  HTN -Hold Hyzaar in setting of dehydration  Depression/anxiety  -Continue Xanax, Cymbalta    DVT prophylaxis: Lovenox Code Status: Full Family Communication: No family at  bedside Disposition Plan: Pending improvement.   It is my clinical opinion that admission to INPATIENT is reasonable and necessary in this 67 y.o. year old female   presenting with symptoms of nausea, vomiting, concerning for dehydration  in the context of PMH including: endometrial cancer on chemo  with pertinent positives on physical exam including: fatigue, lethargy, weakness  and pertinent positives on radiographic and laboratory data including: hypokalemia  Workup and treatment include IV fluids .  Given the aforementioned, the predictability of an adverse outcome is felt to be significant. I expect that the patient will require at least 2 midnights in the hospital to treat this condition.    Consultants:   None  Procedures:   None   Antimicrobials:  Anti-infectives (From admission, onward)   None        Objective: Vitals:   08/29/17 1901 08/29/17 2139 08/30/17 0100 08/30/17 0533  BP: 138/87 (!) 109/54  136/80  Pulse: 84 69  70  Resp: 16 18  14   Temp: 98.3 F (36.8 C) 98.1 F (36.7 C)  98.4 F (36.9 C)  TempSrc: Oral Oral  Oral  SpO2: 99% 95%  95%  Height:   5\' 5"  (1.651 m)     Intake/Output Summary (Last 24 hours) at 08/30/2017 1154 Last data filed at 08/30/2017 0900 Gross per 24 hour  Intake 3066.06 ml  Output -  Net 3066.06 ml   There were no vitals filed for this visit.  Examination:  General exam: Appears calm and comfortable  Respiratory system: Clear to auscultation. Respiratory effort normal. Cardiovascular system: S1 & S2 heard, RRR. No JVD,  murmurs, rubs, gallops or clicks. No pedal edema. Gastrointestinal system: Abdomen is nondistended, soft and nontender. No organomegaly or masses felt. Normal bowel sounds heard. Central nervous system: Alert and oriented. No focal neurological deficits. Extremities: Symmetric 5 x 5 power. Skin: No rashes, lesions or ulcers Psychiatry: Judgement and insight appear normal. Mood & affect appropriate.    Data Reviewed: I have personally reviewed following labs and imaging studies  CBC: Recent Labs  Lab 08/24/17 1329 08/29/17 1622 08/30/17 0540  WBC 6.8 4.9 4.4  NEUTROABS 5.1 3.8  --   HGB 9.9* 9.0* 8.1*  HCT 29.8* 26.8* 24.5*  MCV 87.9 89.0 90.4  PLT 195 274 361   Basic Metabolic Panel: Recent Labs  Lab 08/24/17 1329 08/29/17 1618 08/29/17 1622 08/30/17 0540  NA 136  --  138 143  K 3.1*  --  2.7* 3.2*  CL 92*  --  97* 105  CO2 31  --  28 29  GLUCOSE 202*  --  159* 164*  BUN 15  --  8 6*  CREATININE 1.10*  --  0.94 0.85  CALCIUM 9.4  --  8.2* 8.0*  MG  --  1.7  --   --    GFR: Estimated Creatinine Clearance: 68.2 mL/min (by C-G formula based on SCr of 0.85 mg/dL). Liver Function Tests: Recent Labs  Lab 08/24/17 1329 08/29/17 1622  AST 10* 12*  ALT <6 8  ALKPHOS 102 74  BILITOT 0.5 1.0  PROT 7.0 6.0*  ALBUMIN 2.9* 2.5*   Recent Labs  Lab 08/29/17 1622  LIPASE 18   No results for input(s): AMMONIA in the last 168 hours. Coagulation Profile: No results for input(s): INR, PROTIME in the last 168 hours. Cardiac Enzymes: No results for input(s): CKTOTAL, CKMB, CKMBINDEX, TROPONINI in the last 168 hours. BNP (last 3 results) No results for input(s): PROBNP in the last 8760 hours. HbA1C: No results for input(s): HGBA1C in the last 72 hours. CBG: Recent Labs  Lab 08/29/17 1538 08/29/17 2137 08/30/17 0225 08/30/17 0226 08/30/17 0727  GLUCAP 154* 98 113* 122* 171*   Lipid Profile: No results for input(s): CHOL, HDL, LDLCALC, TRIG, CHOLHDL, LDLDIRECT in the last 72 hours. Thyroid Function Tests: No results for input(s): TSH, T4TOTAL, FREET4, T3FREE, THYROIDAB in the last 72 hours. Anemia Panel: No results for input(s): VITAMINB12, FOLATE, FERRITIN, TIBC, IRON, RETICCTPCT in the last 72 hours. Sepsis Labs: Recent Labs  Lab 08/29/17 1628 08/29/17 1820  LATICACIDVEN 0.89 0.53    No results found for this or any previous visit (from the past 240  hour(s)).     Radiology Studies: Dg Abdomen Acute W/chest  Result Date: 08/29/2017 CLINICAL DATA:  Nausea and vomiting for 3 weeks.  History of cancer. EXAM: DG ABDOMEN ACUTE W/ 1V CHEST COMPARISON:  Abdominal radiograph July 12, 2017 FINDINGS: Cardiomediastinal silhouette is normal. Single-lumen RIGHT chest Port-A-Cath distal tip projecting in proximal superior vena cava. Lungs are clear, no pleural effusions. No pneumothorax. Soft tissue planes and included osseous structures are non suspicious. Mildly elevated RIGHT hemidiaphragm. Bowel gas pattern is nondilated and nonobstructive. No significant retained large bowel stool volume. 15 mm linear metallic foreign body projecting at LEFT iliac crest on single view. Phleboliths project in the pelvis. No intra-abdominal mass effect, pathologic calcifications or free air. Soft tissue planes and included osseous structures are non-suspicious. IMPRESSION: 1. No acute cardiopulmonary process. 2. Normal bowel gas pattern. 3. Needle-like fragment projecting over LEFT iliac crest on single view, potentially external to patient.  Electronically Signed   By: Elon Alas M.D.   On: 08/29/2017 16:43      Scheduled Meds: . cholecalciferol  400 Units Oral Daily  . DULoxetine  20 mg Oral BID  . enoxaparin (LOVENOX) injection  40 mg Subcutaneous Q24H  . ezetimibe  10 mg Oral QHS  . gabapentin  200 mg Oral BID  . insulin pump   Subcutaneous TID AC, HS, 0200  . mouth rinse  15 mL Mouth Rinse BID  . traZODone  100 mg Oral QHS   Continuous Infusions: . sodium chloride 100 mL/hr at 08/30/17 0557     LOS: 0 days    Time spent: 25 minutes   Dessa Phi, DO [facility] [email or web address] Password [email or web address] 08/30/2017, 11:54 AM

## 2017-08-30 NOTE — Progress Notes (Signed)
Initial Nutrition Assessment  DOCUMENTATION CODES:   Severe malnutrition in context of chronic illness, Obesity unspecified  INTERVENTION:   -Order for new weight to be measured -Provide Boost Breeze po TID, each supplement provides 250 kcal and 9 grams of protein  NUTRITION DIAGNOSIS:   Severe Malnutrition related to cancer and cancer related treatments, chronic illness as evidenced by energy intake < or equal to 75% for > or equal to 1 month, percent weight loss, mild muscle depletion.  GOAL:   Patient will meet greater than or equal to 90% of their needs  MONITOR:   PO intake, Supplement acceptance, Labs, Weight trends, I & O's  REASON FOR ASSESSMENT:   Consult Assessment of nutrition requirement/status, Poor PO  ASSESSMENT:   67 y.o. female with medical history significant of endometrial cancer, most recent chemotherapy 3 weeks ago with side effects including uncontrolled nausea, vomiting, dehydration.   Patient followed by [place] RD, last seen 7/30. At that visit, pt was complaining of poor appetite and N/V for about 3 weeks. Severe malnutrition was diagnosed based on intake and weight loss.  Per chart review, pt's chemo is on hold given poor tolerance.   Patient reports today that her N/V never resolved and she has not been eating. She has not been drinking nutrition supplements. She is willing to try Boost Breeze supplements as she feels she cannot tolerate "milky" liquids at this time. States her nausea is controlled at this time. Ate a few bites of her omelet this morning. States she trying to eat her protein foods at least. Denies chewing or swallowing issues. States foods do not taste the same.  Last weight recorded on 7/30. Will order new weight to be measured. At this time, pt has had 31 lb of weight loss (14% wt loss x 3 months, significant for time frame).  Medications: Vitamin D tablet daily, Miralax packet daily, Senokot-S tablet BID  Labs  reviewed: CBGs: 148-171 Low K   NUTRITION - FOCUSED PHYSICAL EXAM:    Most Recent Value  Orbital Region  No depletion  Upper Arm Region  No depletion  Thoracic and Lumbar Region  No depletion  Buccal Region  No depletion  Temple Region  Mild depletion  Clavicle Bone Region  No depletion  Clavicle and Acromion Bone Region  No depletion  Scapular Bone Region  No depletion  Dorsal Hand  No depletion  Patellar Region  No depletion  Anterior Thigh Region  No depletion  Posterior Calf Region  No depletion  Edema (RD Assessment)  None  Hair  Reviewed  Eyes  Reviewed  Mouth  Reviewed  Skin  Reviewed  Nails  Reviewed       Diet Order:   Diet Order           Diet regular Room service appropriate? Yes; Fluid consistency: Thin  Diet effective now          EDUCATION NEEDS:   Education needs have been addressed  Skin:  Skin Assessment: Reviewed RN Assessment  Last BM:  8/3  Height:   Ht Readings from Last 1 Encounters:  08/30/17 5\' 5"  (1.651 m)    Weight:   Wt Readings from Last 1 Encounters:  08/24/17 187 lb 1.6 oz (84.9 kg)    Ideal Body Weight:  56.8 kg  BMI:  Body mass index is 31.14 kg/m.  Estimated Nutritional Needs:   Kcal:  1900-2100  Protein:  85-95g  Fluid:  2L/day  Clayton Bibles, MS, RD, LDN  [place] [facility] Pager: [phone] After Hours Pager: [phone]

## 2017-08-31 ENCOUNTER — Inpatient Hospital Stay (HOSPITAL_COMMUNITY): Payer: Medicare Other

## 2017-08-31 DIAGNOSIS — D649 Anemia, unspecified: Secondary | ICD-10-CM

## 2017-08-31 LAB — CBC
HCT: 24.4 % — ABNORMAL LOW (ref 36.0–46.0)
HEMOGLOBIN: 7.9 g/dL — AB (ref 12.0–15.0)
MCH: 29.6 pg (ref 26.0–34.0)
MCHC: 32.4 g/dL (ref 30.0–36.0)
MCV: 91.4 fL (ref 78.0–100.0)
PLATELETS: 233 10*3/uL (ref 150–400)
RBC: 2.67 MIL/uL — ABNORMAL LOW (ref 3.87–5.11)
RDW: 20.7 % — AB (ref 11.5–15.5)
WBC: 5.2 10*3/uL (ref 4.0–10.5)

## 2017-08-31 LAB — URINE CULTURE
CULTURE: NO GROWTH
SPECIAL REQUESTS: NORMAL

## 2017-08-31 LAB — BASIC METABOLIC PANEL WITH GFR
Anion gap: 13 (ref 5–15)
BUN: 5 mg/dL — ABNORMAL LOW (ref 8–23)
CO2: 24 mmol/L (ref 22–32)
Calcium: 8.1 mg/dL — ABNORMAL LOW (ref 8.9–10.3)
Chloride: 103 mmol/L (ref 98–111)
Creatinine, Ser: 0.89 mg/dL (ref 0.44–1.00)
GFR calc Af Amer: >60 mL/min
GFR calc non Af Amer: >60 mL/min
Glucose, Bld: 189 mg/dL — ABNORMAL HIGH (ref 70–99)
Potassium: 3.5 mmol/L (ref 3.5–5.1)
Sodium: 140 mmol/L (ref 135–145)

## 2017-08-31 LAB — GLUCOSE, CAPILLARY
GLUCOSE-CAPILLARY: 164 mg/dL — AB (ref 70–99)
GLUCOSE-CAPILLARY: 223 mg/dL — AB (ref 70–99)
GLUCOSE-CAPILLARY: 251 mg/dL — AB (ref 70–99)
Glucose-Capillary: 152 mg/dL — ABNORMAL HIGH (ref 70–99)
Glucose-Capillary: 156 mg/dL — ABNORMAL HIGH (ref 70–99)

## 2017-08-31 MED ORDER — IOPAMIDOL (ISOVUE-300) INJECTION 61%: 15.0000 mL | Freq: Once | INTRAVENOUS | Status: AC | PRN

## 2017-08-31 MED ORDER — ENOXAPARIN SODIUM 100 MG/ML ~~LOC~~ SOLN
1.0000 mg/kg | Freq: Two times a day (BID) | SUBCUTANEOUS | Status: DC
  Administered 2017-08-31 – 2017-09-02 (×4): 85 mg via SUBCUTANEOUS
  Filled 2017-08-31 (×4): qty 1

## 2017-08-31 MED ORDER — IOHEXOL 300 MG/ML  SOLN
100.0000 mL | Freq: Once | INTRAMUSCULAR | Status: AC | PRN
  Administered 2017-08-31: 100 mL via INTRAVENOUS

## 2017-08-31 MED ORDER — IOPAMIDOL (ISOVUE-300) INJECTION 61%
INTRAVENOUS | Status: AC
  Administered 2017-08-31: 30 mL
  Filled 2017-08-31: qty 30

## 2017-08-31 NOTE — Care Management Note (Signed)
Case Management Note  Patient Details  Name: BRIELLAH BAIK MRN: [id number] Date of Birth: [birth year]  Subjective/Objective:   67 y/o f admitted w/dehydration. Active w/[facility] HHRN-IV hydration-if d/c plan return home w/same services please order [place].                  Action/Plan:d/c home w/[facility]HC.   Expected Discharge Date:  (unknown)               Expected Discharge Plan:  [place]  In-House Referral:     Discharge planning Services  CM Consult  Post Acute Care Choice:  Home Health(Active w/[facility] HHRN-IV hydration.) Choice offered to:     DME Arranged:    DME Agency:     HH Arranged:    HH Agency:     Status of Service:  In process, will continue to follow  If discussed at Long Length of Stay Meetings, dates discussed:    Additional Comments:  Dessa Phi, RN 08/31/2017, 11:19 AM

## 2017-08-31 NOTE — Progress Notes (Signed)
Patient has contrast at bedside for CT today. Patient complained of nausea. PRN given. Patient will attempt to drink contrast once nausea is relieved. ELQ

## 2017-08-31 NOTE — Progress Notes (Signed)
PROGRESS NOTE    Katie Palmer  GQQ:[id number] DOB: Feb 24, 1950 DOA: 08/29/2017 PCP: Laurey Morale, MD     Brief Narrative:  Katie Palmer is a 67 y.o. female with medical history significant of endometrial cancer, most recent chemotherapy 3 weeks ago with side effects including uncontrolled nausea, vomiting, dehydration. Chemotherapy has been on hold.  Also with past medical history of diabetes, anxiety and depression, essential hypertension.  She states that she has had some nausea and vomiting since her surgery back in April.  This has been getting worse in the past few weeks after chemotherapy treatment.  She states that she vomits intermittently, twice yesterday and once this morning.  She denies any abdominal pain, diarrhea.  No fevers at home.  Denies any chest pain or shortness of breath or cough.  She states that she has not eaten anything in days. Labs obtained in the emergency department which revealed hypokalemia potassium 2.7.  Chest x-ray and abdominal x-ray obtained, no acute process.  She was given potassium supplementation, IV fluid, Phenergan.  New events last 24 hours / Subjective: Continues to have abdominal pain, although no further nausea, vomiting. No appetite and did not eat breakfast.   Assessment & Plan:   Principal Problem:   Dehydration Active Problems:   Essential hypertension   Depression   Endometrial cancer (HCC)   Generalized anxiety disorder   Nausea with vomiting   Protein-calorie malnutrition, severe    Dehydration secondary to intractable nausea and vomiting -Antiemetics and supportive care -Continue IV fluids -Per Dr. Alvy Bimler, follow up CT A/P to rule out cancer progression as cause of her symptoms   Endometrial cancer -Follows with Dr. Alvy Bimler, appreciate her follow up and input  -Chemo on hold for now. Radiation scheduled end of the week   DM -Continue home insulin pump, continue Neurontin. Blood sugar well controlled today    HTN -Hold Hyzaar in setting of dehydration  Depression/anxiety  -Continue Xanax, Cymbalta  Normocytic anemia of chronic disease -Monitor CBC, may need pRBC transfusion if Hgb falls more   Severe protein calorie malnutrition -Dietitian following     DVT prophylaxis: Lovenox Code Status: Full Family Communication: No family at bedside Disposition Plan: Pending improvement    Consultants:   None  Procedures:   None   Antimicrobials:  Anti-infectives (From admission, onward)   None       Objective: Vitals:   08/30/17 0533 08/30/17 1312 08/30/17 2128 08/31/17 0459  BP: 136/80 (!) 153/80 129/68 140/70  Pulse: 70 76 79 83  Resp: 14 16 17 16   Temp: 98.4 F (36.9 C) 99.6 F (37.6 C) 99 F (37.2 C) 98.5 F (36.9 C)  TempSrc: Oral Oral Oral Oral  SpO2: 95% 97% 94% 99%  Height:        Intake/Output Summary (Last 24 hours) at 08/31/2017 1029 Last data filed at 08/31/2017 0501 Gross per 24 hour  Intake 1860.76 ml  Output -  Net 1860.76 ml   There were no vitals filed for this visit.  Examination: General exam: Appears calm and comfortable  Respiratory system: Clear to auscultation. Respiratory effort normal. Cardiovascular system: S1 & S2 heard, RRR. No JVD, murmurs, rubs, gallops or clicks. No pedal edema. Gastrointestinal system: Abdomen is nondistended, soft and nontender. No organomegaly or masses felt. Hypoactive bowel sounds heard. Central nervous system: Alert and oriented. No focal neurological deficits. Extremities: Symmetric 5 x 5 power. Skin: No rashes, lesions or ulcers Psychiatry: Judgement and insight appear normal. Mood &  affect appropriate.    Data Reviewed: I have personally reviewed following labs and imaging studies  CBC: Recent Labs  Lab 08/24/17 1329 08/29/17 1622 08/30/17 0540 08/31/17 0500  WBC 6.8 4.9 4.4 5.2  NEUTROABS 5.1 3.8  --   --   HGB 9.9* 9.0* 8.1* 7.9*  HCT 29.8* 26.8* 24.5* 24.4*  MCV 87.9 89.0 90.4 91.4  PLT  195 274 226 782   Basic Metabolic Panel: Recent Labs  Lab 08/24/17 1329 08/29/17 1618 08/29/17 1622 08/30/17 0540 08/31/17 0500  NA 136  --  138 143 140  K 3.1*  --  2.7* 3.2* 3.5  CL 92*  --  97* 105 103  CO2 31  --  28 29 24   GLUCOSE 202*  --  159* 164* 189*  BUN 15  --  8 6* 5*  CREATININE 1.10*  --  0.94 0.85 0.89  CALCIUM 9.4  --  8.2* 8.0* 8.1*  MG  --  1.7  --   --   --    GFR: Estimated Creatinine Clearance: 65.1 mL/min (by C-G formula based on SCr of 0.89 mg/dL). Liver Function Tests: Recent Labs  Lab 08/24/17 1329 08/29/17 1622  AST 10* 12*  ALT <6 8  ALKPHOS 102 74  BILITOT 0.5 1.0  PROT 7.0 6.0*  ALBUMIN 2.9* 2.5*   Recent Labs  Lab 08/29/17 1622  LIPASE 18   No results for input(s): AMMONIA in the last 168 hours. Coagulation Profile: No results for input(s): INR, PROTIME in the last 168 hours. Cardiac Enzymes: No results for input(s): CKTOTAL, CKMB, CKMBINDEX, TROPONINI in the last 168 hours. BNP (last 3 results) No results for input(s): PROBNP in the last 8760 hours. HbA1C: No results for input(s): HGBA1C in the last 72 hours. CBG: Recent Labs  Lab 08/30/17 1217 08/30/17 1745 08/30/17 2129 08/31/17 0327 08/31/17 0759  GLUCAP 148* 120* 166* 152* 156*   Lipid Profile: No results for input(s): CHOL, HDL, LDLCALC, TRIG, CHOLHDL, LDLDIRECT in the last 72 hours. Thyroid Function Tests: No results for input(s): TSH, T4TOTAL, FREET4, T3FREE, THYROIDAB in the last 72 hours. Anemia Panel: No results for input(s): VITAMINB12, FOLATE, FERRITIN, TIBC, IRON, RETICCTPCT in the last 72 hours. Sepsis Labs: Recent Labs  Lab 08/29/17 1628 08/29/17 1820  LATICACIDVEN 0.89 0.53    Recent Results (from the past 240 hour(s))  Culture, blood (routine x 2)     Status: None (Preliminary result)   Collection Time: 08/29/17  4:22 PM  Result Value Ref Range Status   Specimen Description   Final    BLOOD PORTA CATH Performed at [facility], [place] [address], [place], [place] [address]    Special Requests   Final    BOTTLES DRAWN AEROBIC AND ANAEROBIC Blood Culture adequate volume Performed at [facility] [address] [place], [place] [address]    Culture   Final    NO GROWTH < 24 HOURS Performed at [facility] [address], [place], [place] [address]    Report Status PENDING  Incomplete  Culture, blood (routine x 2)     Status: None (Preliminary result)   Collection Time: 08/29/17  4:22 PM  Result Value Ref Range Status   Specimen Description   Final    BLOOD BLOOD LEFT ARM Performed at [facility] [address], [place], [place] [address]    Special Requests   Final    BOTTLES DRAWN AEROBIC AND ANAEROBIC Blood  Culture results may not be optimal due to an excessive volume of blood received in culture bottles Performed at [facility] [address] [place], [place] [address]    Culture   Final    NO GROWTH < 24 HOURS Performed at [facility] [address], [place], [place] [address]    Report Status PENDING  Incomplete  Urine culture     Status: None   Collection Time: 08/30/17  7:13 AM  Result Value Ref Range Status   Specimen Description   Final    URINE, CATHETERIZED Performed at [facility] [address], [place], [place] [address]    Special Requests   Final    Normal Performed at [facility], [place] [address], [place], [place] [address]    Culture   Final    NO GROWTH Performed at [facility], [place] [address], [place],  [address]    Report Status 08/31/2017 FINAL  Final       Radiology Studies: Dg Abdomen Acute W/chest  Result Date: 08/29/2017 CLINICAL DATA:  Nausea and vomiting for 3 weeks.  History of cancer. EXAM: DG ABDOMEN ACUTE W/ 1V CHEST COMPARISON:  Abdominal radiograph July 12, 2017 FINDINGS: Cardiomediastinal silhouette is normal.  Single-lumen RIGHT chest Port-A-Cath distal tip projecting in proximal superior vena cava. Lungs are clear, no pleural effusions. No pneumothorax. Soft tissue planes and included osseous structures are non suspicious. Mildly elevated RIGHT hemidiaphragm. Bowel gas pattern is nondilated and nonobstructive. No significant retained large bowel stool volume. 15 mm linear metallic foreign body projecting at LEFT iliac crest on single view. Phleboliths project in the pelvis. No intra-abdominal mass effect, pathologic calcifications or free air. Soft tissue planes and included osseous structures are non-suspicious. IMPRESSION: 1. No acute cardiopulmonary process. 2. Normal bowel gas pattern. 3. Needle-like fragment projecting over LEFT iliac crest on single view, potentially external to patient. Electronically Signed   By: Elon Alas M.D.   On: 08/29/2017 16:43      Scheduled Meds: . cholecalciferol  400 Units Oral Daily  . DULoxetine  20 mg Oral BID  . enoxaparin (LOVENOX) injection  40 mg Subcutaneous Q24H  . ezetimibe  10 mg Oral QHS  . feeding supplement  1 Container Oral TID BM  . gabapentin  200 mg Oral BID  . insulin pump   Subcutaneous TID AC, HS, 0200  . iopamidol      . mouth rinse  15 mL Mouth Rinse BID  . polyethylene glycol  17 g Oral Daily  . senna-docusate  2 tablet Oral BID  . traZODone  100 mg Oral QHS   Continuous Infusions: . sodium chloride 100 mL/hr at 08/31/17 0501     LOS: 1 day    Time spent: 25 minutes   Dessa Phi, DO [facility] [email or web address] Password [email or web address] 08/31/2017, 10:29 AM

## 2017-08-31 NOTE — Progress Notes (Signed)
I received a call from the radiologist regarding CT scan results of this patient.  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 I changed her Lovenox from prophylactic 40 mg subcu daily to 85 mg subcutaneously twice daily as a therapeutic treatment for her condition. The patient will be seen by Dr. Alvy Bimler tomorrow and she can adjust her treatment as needed and also discussed with her the progressive nature of her disease.

## 2017-08-31 NOTE — Progress Notes (Signed)
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 ([place]) She has uncontrolled diabetes with each dose of chemotherapy despite medication adjustment For now, I recommend she continues close follow-up with her endocrinologist for management She will continue close monitoring and insulin as needed  Dehydration She had recent profound weight loss secondary to dehydration due to nausea from treatment I recommend IV fluid support at home through advanced home care agency daily, to resume upon discharge  Nausea with vomiting She had recent uncontrolled nausea vomiting She felt that she tolerated Phenergan well We will continue Phenergan as needed along with IV fluid support and IV antiemetics as needed I recommend CT scan of the abdomen and pelvis to rule out cancer progression as a cause of her severe nausea and vomiting  Progressive anemia Could be due to hemodilution versus anemia chronic illness Plan to recheck CBC tomorrow If her hemoglobin drops further, she may benefit from blood transfusion I will order type and screen tomorrow  Severe constipation I will order scheduled MiraLAX and Senokot Due to unresolved symptoms, will order CT scan of the abdomen and pelvis to rule out carcinomatosis as a cause of severe constipation  Generalized weakness She has profound generalized weakness due to dehydration and poor oral intake Due to her debility  and immunocompromise state, we will provide services through advanced home care agency upon discharge  Discharge planning Will reassess daily She is not ready to be discharged due to unresolved symptoms  Heath Lark, MD 08/31/2017  9:49 AM   Subjective:  She did not order any breakfast yet.  Persistent nausea.  She had no bowel movement for 4 days.  She continues to have diffuse abdominal discomfort The patient denies any recent signs or symptoms of bleeding such as spontaneous epistaxis, hematuria or hematochezia.  Objective:  Vitals:   08/30/17 2128 08/31/17 0459  BP: 129/68 140/70  Pulse: 79 83  Resp: 17 16  Temp: 99 F (37.2 C) 98.5 F (36.9 C)  SpO2: 94% 99%     Intake/Output Summary (Last 24 hours) at 08/31/2017 0949 Last data filed at 08/31/2017 0501 Gross per 24 hour  Intake 1860.76 ml  Output -  Net 1860.76 ml    GENERAL:alert, no distress and comfortable SKIN: skin color is pale, texture, turgor are normal, no rashes or significant lesions ABDOMEN:abdomen soft, reduced bowel sounds Musculoskeletal:no cyanosis of digits and no clubbing  NEURO: alert & oriented x 3 with fluent speech, no focal motor/sensory deficits   Labs:  Lab Results  Component Value Date   WBC 5.2 08/31/2017   HGB 7.9 (L) 08/31/2017   HCT 24.4 (L) 08/31/2017   MCV 91.4 08/31/2017   PLT 233 08/31/2017   NEUTROABS 3.8 08/29/2017    Lab Results  Component Value Date   NA 140 08/31/2017   K 3.5 08/31/2017   CL 103 08/31/2017   CO2 24 08/31/2017    Studies:  Dg Abdomen Acute W/chest  Result Date: 08/29/2017 CLINICAL DATA:  Nausea and vomiting for 3 weeks.  History of cancer. EXAM: DG ABDOMEN ACUTE W/ 1V CHEST COMPARISON:  Abdominal radiograph July 12, 2017 FINDINGS: Cardiomediastinal silhouette is normal. Single-lumen RIGHT chest Port-A-Cath distal tip projecting in proximal superior vena cava. Lungs are clear, no pleural effusions. No pneumothorax. Soft tissue planes and included osseous  structures are non suspicious. Mildly elevated RIGHT hemidiaphragm. Bowel gas pattern is nondilated and nonobstructive. No significant retained large bowel stool volume. 15 mm linear metallic foreign body projecting at LEFT iliac crest on single view. Phleboliths project in the pelvis. No intra-abdominal mass effect, pathologic calcifications or free air. Soft tissue planes and included osseous structures are non-suspicious. IMPRESSION: 1. No acute cardiopulmonary process. 2. Normal bowel gas pattern. 3. Needle-like fragment projecting over LEFT iliac crest on single view, potentially external to patient. Electronically Signed   By: Elon Alas M.D.   On: 08/29/2017 16:43

## 2017-09-01 DIAGNOSIS — Z7189 Other specified counseling: Secondary | ICD-10-CM

## 2017-09-01 DIAGNOSIS — F329 Major depressive disorder, single episode, unspecified: Secondary | ICD-10-CM

## 2017-09-01 DIAGNOSIS — F411 Generalized anxiety disorder: Secondary | ICD-10-CM

## 2017-09-01 DIAGNOSIS — N1330 Unspecified hydronephrosis: Secondary | ICD-10-CM

## 2017-09-01 DIAGNOSIS — E876 Hypokalemia: Secondary | ICD-10-CM

## 2017-09-01 DIAGNOSIS — I10 Essential (primary) hypertension: Secondary | ICD-10-CM

## 2017-09-01 DIAGNOSIS — Z515 Encounter for palliative care: Secondary | ICD-10-CM

## 2017-09-01 DIAGNOSIS — E43 Unspecified severe protein-calorie malnutrition: Secondary | ICD-10-CM

## 2017-09-01 DIAGNOSIS — G893 Neoplasm related pain (acute) (chronic): Secondary | ICD-10-CM

## 2017-09-01 LAB — BASIC METABOLIC PANEL
ANION GAP: 19 — AB (ref 5–15)
BUN: 7 mg/dL — ABNORMAL LOW (ref 8–23)
CHLORIDE: 105 mmol/L (ref 98–111)
CO2: 14 mmol/L — AB (ref 22–32)
Calcium: 8.5 mg/dL — ABNORMAL LOW (ref 8.9–10.3)
Creatinine, Ser: 1.18 mg/dL — ABNORMAL HIGH (ref 0.44–1.00)
GFR calc Af Amer: 54 mL/min — ABNORMAL LOW (ref >60)
GFR calc non Af Amer: 46 mL/min — ABNORMAL LOW (ref >60)
Glucose, Bld: 314 mg/dL — ABNORMAL HIGH (ref 70–99)
POTASSIUM: 4.6 mmol/L (ref 3.5–5.1)
Sodium: 138 mmol/L (ref 135–145)

## 2017-09-01 LAB — GLUCOSE, CAPILLARY
GLUCOSE-CAPILLARY: 281 mg/dL — AB (ref 70–99)
GLUCOSE-CAPILLARY: 259 mg/dL — AB (ref 70–99)
Glucose-Capillary: 253 mg/dL — ABNORMAL HIGH (ref 70–99)
Glucose-Capillary: 295 mg/dL — ABNORMAL HIGH (ref 70–99)
Glucose-Capillary: 184 mg/dL — ABNORMAL HIGH (ref 70–99)

## 2017-09-01 LAB — CBC
HEMATOCRIT: 28.5 % — AB (ref 36.0–46.0)
HEMOGLOBIN: 9.2 g/dL — AB (ref 12.0–15.0)
MCH: 30.1 pg (ref 26.0–34.0)
MCHC: 32.3 g/dL (ref 30.0–36.0)
MCV: 93.1 fL (ref 78.0–100.0)
Platelets: 308 10*3/uL (ref 150–400)
RBC: 3.06 MIL/uL — ABNORMAL LOW (ref 3.87–5.11)
RDW: 21 % — ABNORMAL HIGH (ref 11.5–15.5)
WBC: 7.1 10*3/uL (ref 4.0–10.5)

## 2017-09-01 LAB — TYPE AND SCREEN
ABO/RH(D): O POS
Antibody Screen: NEGATIVE

## 2017-09-01 LAB — MAGNESIUM: MAGNESIUM: 1.7 mg/dL (ref 1.7–2.4)

## 2017-09-01 MED ORDER — PROCHLORPERAZINE EDISYLATE 10 MG/2ML IJ SOLN: 10.0000 mg | Freq: Four times a day (QID) | INTRAMUSCULAR | Status: DC | PRN

## 2017-09-01 MED ORDER — MORPHINE SULFATE 2 MG/ML IJ SOLN: 2.0000 mg | INTRAMUSCULAR | Status: DC | PRN

## 2017-09-01 MED ORDER — FENTANYL 25 MCG/HR TD PT72
25.0000 ug | MEDICATED_PATCH | TRANSDERMAL | Status: DC
  Administered 2017-09-01: 25 ug via TRANSDERMAL
  Filled 2017-09-01: qty 1

## 2017-09-01 MED ORDER — TRAMADOL HCL 50 MG PO TABS
50.0000 mg | ORAL_TABLET | Freq: Once | ORAL | Status: AC
  Administered 2017-09-01: 50 mg via ORAL
  Filled 2017-09-01: qty 1

## 2017-09-01 MED ORDER — FAMOTIDINE 20 MG PO TABS
20.0000 mg | ORAL_TABLET | Freq: Two times a day (BID) | ORAL | Status: DC
  Administered 2017-09-01 – 2017-09-02 (×3): 20 mg via ORAL
  Filled 2017-09-01 (×3): qty 1

## 2017-09-01 MED ORDER — MORPHINE SULFATE (PF) 2 MG/ML IV SOLN
2.0000 mg | INTRAVENOUS | Status: DC | PRN
  Administered 2017-09-02: 4 mg via INTRAVENOUS
  Filled 2017-09-01: qty 2

## 2017-09-01 MED ORDER — CALCIUM CARBONATE ANTACID 500 MG PO CHEW
1.0000 | CHEWABLE_TABLET | Freq: Three times a day (TID) | ORAL | Status: DC | PRN
Start: 2017-09-01 — End: 2017-09-02

## 2017-09-01 MED ORDER — ALPRAZOLAM 0.5 MG PO TABS
0.5000 mg | ORAL_TABLET | Freq: Two times a day (BID) | ORAL | Status: DC | PRN
  Administered 2017-09-01: 1 mg via ORAL
  Filled 2017-09-01: qty 2

## 2017-09-01 NOTE — Consult Note (Signed)
Consultation Note Date: 09/01/2017   Patient Name: Katie Palmer  DOB: 09/13/1950  MRN: [id number]  Age / Sex: 67 y.o., female  PCP: Katie Morale, MD Referring Physician: Cristal Ford, DO  Reason for Consultation: Establishing goals of care  HPI/Patient Profile: 67 y.o. female admitted on 08/29/2017 from home with complaints of uncontrolled nausea and vomiting. She has a past medical history of endometrial cancer, anxiety, depression, diabetes (pump dependent), hypertension, RBBB, and dibromyalgia. She received chemotherapy 3 weeks ago with side effects of uncontrolled nausea, vomiting, and dehydration. Her chemotherapy was then placed on hold. On admission patient reported her symptoms have worsened over the past few weeks and she has no appetite. She reported she had not eaten in days. She denied abdominal pain or diarrhea. During ED course her potassium was 2.7. She was given IV fluids and antiemetics. Since admission she has been followed by Dr. Alvy Palmer (Oncologist). Patient's recent CT scan unfortunately showed significant recurrence of disease with signs of carcinomatosis. Her last CT scan was 2 months prior and she has also received 3 doses of chemotherapy since. Palliative consulted for goals of care discussion.   Clinical Assessment and Goals of Care: I have reviewed medical records including lab results, imaging, Epic notes, and MAR, received report from the bedside RN, and assessed the patient. I then met at the bedside with patient and her son, Katie Palmer to discuss diagnosis prognosis, [place], EOL wishes, disposition and options. Patient is A&O x3. She is able to engage in goals of care discussion with her son and I.   I introduced Palliative Medicine as specialized medical care for people living with serious illness. It focuses on providing relief from the symptoms and stress of a serious illness. The goal  is to improve quality of life for both the patient and the family.  Generally I would review patient's functional and nutritional status, as well as a brief life review. Patient however, has declined to discuss any of this. She states she just want to talk medically and let her son know what is going on and their options. She is somewhat anxious and tearful. She does not want to think about the past and when she felt good.   Both patient and son tearful stating, the last 6-7 moths have been a "nightmare" with her diagnosis, treatments, decline in health, and dealing with symptoms. All while hoping to beat her cancer, and only to be told she is going to die sooner than later from it. Support given.   We discussed her current illness and what it means in the larger context of her on-going co-morbidities.  Natural disease trajectory and expectations at EOL were discussed. Patient's son is tearful and states "I knew it was going to be bad news but not this bad." Patient and I discussed with son her current illness. Katie Palmer was able to relay information regarding her conversation with Oncology with my support.   I attempted to elicit values and goals of care important to the patient and  her son.    The difference between aggressive medical intervention and comfort care was considered in light of the patient's goals of care. Patient and son verbalized that their ultimate goal for her is to make sure that she is comfortable and not suffering. Patient stated she was told that her receiving IV fluids is most likely what is allowing her to continue on with some strength at this point. We discussed what comfort measures would look like. Both patient and son verbalized understanding that care would be comfort focused and the management of symptoms such as anxiety, pain, nausea, shortness of breath, etc. They are aware that aggressive measures such as lab work, radiology exams, and continuous IV fluids would not be  a part of this care.  Advanced directives, concepts specific to code status, artifical feeding and hydration, and rehospitalization were considered and discussed. Patient has requested to be a DNR/DNI with awareness that her cancer is terminal.  Son verbalized that he is having a hard time with the concept of not continuing with IV fluids being she was receiving these services prior to admission through [facility].  Patient also verbalized her concern with this. She reports that if the IV fluids are what is allowing her to maintain some strength and continue on, that she is not sure she could accept not receiving them to buy her some more time. I attempted to educate her that despite IV fluids her disease trajectory/progression would not cease or slow down in relation to the IV fluids and that the fluids are not guaranteed to grant her a longer time. We also discussed that it would not be feasible to continue with fluids outpatient on a daily basis or to keep her in the hospital for IV fluid support. Patient verbalized understanding as well as her son. Patient has agreed that she wishes to be a DNR/DNI. She is aware that staff will place a purple DNR bracelet on her to identify her wishes. She verbalized understanding and agreement.   Hospice and Palliative Care services outpatient were explained and offered. Patient is aware that she is eligible for Hospice services based on her expressed wishes to return home and to be kept comfortable during end-of-life. We discussed in details her options of going home with hospice services and 24/7 family support or to a residential hospice facility. She questioned equipment needs and we discussed that hospice would provide these in the home, specifically a bed, as she request. Son and patient verbalized understanding. Patient and son reports that they want her to come home and pass away in the home amongst family and friends. Son states he would be there with her  24/7 as well as some close family friends.   Patient and son remains undecided about their decision to have hospice as they are reluctant to know that this would not include IV fluids. We discussed that fluids may be considered on a time trial for a few days but could not guarantee. Patient states she has so much to do prior to her death and being unable to do these things dealing with her home, finances, etc. We discussed obtaining a document to allow son to have HCPOA and how she could obtain documents for financial power of attorney. However, patient states "there are things I need to complete on my own, and I feel that not receiving fluids when I am weak and will hinder me from during these things." She has requested to wait until her conversation with  Katie Palmer on tomorrow morning before making a decision on disposition.   Questions and concerns were addressed. The family was encouraged to call with questions or concerns.  PMT will continue to support holistically.  Primary Decision Maker: Patient is capable of making medical decisions at this time. If she is unable to make decisions she verbalizes that her son [name] would be her decision maker.    SUMMARY OF RECOMMENDATIONS    DNR/DNI- as requested by patient  Continue treating the treatable while hospitalized without escalation of care. Patient is aware that her cancer is terminal. She wishes to be discharged home for end-of-life care when it is time. We discussed in details her options and this is her and her son wishes. She is aware that hospice will assist with equipment needs as she states she will most likely need a bed if not at discharge once she returns home.   Patient would like time to futher discuss with her son and engage in discussion with Dr. Alvy Palmer tomorrow am at [facility] before deciding on home with hospice. She is torn by the fact that she would not be able to openly request fluids daily or when needed with hospice care. We  discussed goals of hospice and trajectory of disease process despite IV fluids.   Once discussion with Dr. Alvy Palmer is complete patient will make a decision. Will not place order for Case Management for outpatient hospice until patient is comfortable with her decision.   Prior to conversation patient was experiencing some anxiety per bedside nurse and patient. Appropriate to allow Xanax TID PRN for anxiety and will modify from QHS for sleep.   Palliative Medicine will continue to support patient, family, and medical team during hospitalization.   Code Status/Advance Care Planning:  DNR/DNI    Symptom Management:   Xanax PRN for anxiety/sleep  Fenatnyl 45mg patch for pain   Morphine PRN for pain  Zofran/Compazine PRN for nausea  Palliative Prophylaxis:   Bowel Regimen, Frequent Pain Assessment and Turn Reposition  Additional Recommendations (Limitations, Scope, Preferences):  Full Scope Treatment-continue to treat the treatable without escalation of care.   Psycho-social/Spiritual:   Desire for further Chaplaincy support:NO   Additional Recommendations: Education on Hospice  Prognosis:   < 6 weeks- in the setting of decreased po intake, decreased mobility, generalized weakness, diabetes w/insulin pump, endstage endometrial cancer s/p chemo without imrpovement, large IVC clot, bilateral hydronephrosis secondary to ureteric obstruction from tumor, cancer pain, nausea, and dehydration.   Discharge Planning: To Be Determined Patient opted to discuss diease trajectory further with Dr. GAlvy Bimlerprior to making decision.      Primary Diagnoses: Present on Admission: . Dehydration . Essential hypertension . Depression . Generalized anxiety disorder . Endometrial cancer ([place] . Nausea with vomiting   I have reviewed the medical record, interviewed the patient and family, and examined the patient. The following aspects are pertinent.  Past Medical History:  Diagnosis  Date  . Depression   . Fibromyalgia   . Headache(784.0)   . History of colon polyps   . History of diabetes with ketoacidosis   . Hx of colonic polyps   . Hypertension   . Insomnia   . Insulin pump in place   . RBBB (right bundle branch block)   . RLQ abdominal pain   . Thickened endometrium   . Type 1 diabetes mellitus with long-term current use of insulin ([facility] endocrinologist-- dr bChalmers Cater  July 2015--  pt took Invokana and caused complete  pancreas shut-down , pt went from pre-diabetic to adult type 1 dm now insulin dependent  . Vertigo    Social History   Socioeconomic History  . Marital status: Divorced    Spouse name: Not on file  . Number of children: 1  . Years of education: Not on file  . Highest education level: Not on file  Occupational History  . 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 Financial resource strain: Not on file  . Food insecurity:    Worry: Not on file    Inability: Not on file  . Transportation needs:    Medical: Not on file    Non-medical: Not on file  Tobacco Use  . Smoking status: Never Smoker  . Smokeless tobacco: Never Used  Substance and Sexual Activity  . Alcohol use: No    Alcohol/week: 0.0 oz  . Drug use: No  . Sexual activity: Not Currently    Birth control/protection: Post-menopausal  Lifestyle  . Physical activity:    Days per week: Not on file    Minutes per session: Not on file  . Stress: Not on file  Relationships  . Social connections:    Talks on phone: Not on file    Gets together: Not on file    Attends religious service: Not on file    Active member of club or organization: Not on file    Attends meetings of clubs or organizations: Not on file    Relationship status: Not on file  Other Topics Concern  . Not on file  Social History Narrative   Divorced.  Son lives with her.  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   Family History  Problem Relation Age of Onset  . Alcohol abuse  Father        Died age 70  . Heart disease Father   . Aneurysm Mother        Died age 41  . Leukemia Brother        Died age 69  . COPD Sister   . Rheum arthritis Sister   . Heart failure Sister   . Dementia Maternal Uncle   . Colon cancer Neg Hx    Scheduled Meds: . DULoxetine  20 mg Oral BID  . enoxaparin (LOVENOX) injection  1 mg/kg Subcutaneous Q12H  . famotidine  20 mg Oral BID  . feeding supplement  1 Container Oral TID BM  . fentaNYL  25 mcg Transdermal Q72H  . insulin pump   Subcutaneous TID AC, HS, 0200  . mouth rinse  15 mL Mouth Rinse BID  . senna-docusate  2 tablet Oral BID  . traZODone  100 mg Oral QHS   Continuous Infusions: . sodium chloride 100 mL/hr at 09/01/17 1338   PRN Meds:.acetaminophen **OR** acetaminophen, ALPRAZolam, calcium carbonate, morphine injection, ondansetron **OR** ondansetron (ZOFRAN) IV, prochlorperazine Medications Prior to Admission:  Prior to Admission medications   Medication Sig Start Date End Date Taking? Authorizing Provider  Acetaminophen (TYLENOL PO) Take 1 tablet by mouth every 6 (six) hours as needed (pain/headache).   Yes [provider]  acetaminophen (TYLENOL) 500 MG tablet Take 1,000 mg by mouth every 6 (six) hours as needed.   Yes [provider]  ALPRAZolam (XANAX) 1 MG tablet TAKE 1/2 TO 1 TABLET BY MOUTH AT BEDTIME AS NEEDED FOR ANXIETY Patient taking differently: TAKE 1/2 TO 1 TABLET (0.5 - 1 MG)  BY MOUTH AT BEDTIME AS NEEDED FOR  ANXIETY 05/05/16  Yes Katie Morale, MD  DULoxetine (CYMBALTA) 20 MG capsule Take 1 capsule (20 mg total) by mouth 2 (two) times daily. Once daily for 2 weeks then increase to twice daily 08/19/17  Yes Tanner, Lyndon Code., PA-C  ezetimibe (ZETIA) 10 MG tablet Take 10 mg by mouth at bedtime.  09/16/16  Yes [provider]  gabapentin (NEURONTIN) 100 MG capsule Take 200 mg two times a day Patient taking differently: Take 200 mg by mouth 2 (two) times daily.  05/18/17  Yes Cross,  [name] D, NP  glucagon (GLUCAGON EMERGENCY) 1 MG injection Inject 1 mg into the muscle once as needed (low blood sugar).   Yes [provider]  Insulin Human (INSULIN PUMP) SOLN Inject into the skin continuous. Use with Novolog   Yes [provider]  lidocaine-prilocaine (EMLA) cream Apply to affected area once 07/07/17  Yes Katie Palmer, Ni, MD  losartan-hydrochlorothiazide (HYZAAR) 50-12.5 MG tablet TAKE 1 TABLET BY MOUTH EVERY DAY Patient taking differently: TAKE 1/2 TABLET BY MOUTH EVERY DAY 07/19/17  Yes Katie Morale, MD  ondansetron (ZOFRAN) 8 MG tablet Take 1 tablet (8 mg total) by mouth every 8 (eight) hours as needed for refractory nausea / vomiting. Start on day 3 after chemo. 07/07/17  Yes Gorsuch, Ni, MD  prochlorperazine (COMPAZINE) 10 MG tablet TAKE 1 TABLET BY MOUTH EVERY 6 HOURS AS NEEDED FOR NAUSEA OR VOMITING 07/07/17  Yes Heath Lark, MD  promethazine (PHENERGAN) 25 MG tablet Take 1 tablet (25 mg total) by mouth every 6 (six) hours as needed for nausea or vomiting. 08/25/17  Yes Katie Palmer, Ni, MD  senna (SENOKOT) 8.6 MG TABS tablet Take 1 tablet (8.6 mg total) by mouth at bedtime. 07/12/17  Yes Gorsuch, Ni, MD  SUMAtriptan (IMITREX) 100 MG tablet TAKE 1 TABLET BY MOUTH AS NEEDED FOR MIGRAINES. MAY REPEAT IN 2 HOURS IF NEEDED Patient taking differently: Take 100 mg by mouth every 2 (two) hours as needed for migraine.  02/05/17  Yes Katie Morale, MD  traMADol (ULTRAM) 50 MG tablet TAKE 2 TABLETS BY MOUTH EVERY 4 HOURS AS NEEDED Patient taking differently: TAKE 2 TABLET (50 MG) BY MOUTH EVERY 4 HOURS AS NEEDED FOR PAIN 05/06/17  Yes Katie Morale, MD  traZODone (DESYREL) 100 MG tablet Take 100 mg by mouth at bedtime.  04/15/15  Yes [provider]   Allergies  Allergen Reactions  . Armodafinil Other (See Comments)    Flu-like symptoms  . Invokana [Canagliflozin] Other (See Comments)    "shut down pancreas"   . Lasix [Furosemide] Diarrhea    Severe diarrhea  .  Adhesive [Tape] Itching and Rash    Pt has not tried paper tape   Review of Systems  Constitutional: Positive for activity change and fatigue.  Neurological: Positive for weakness.  Psychiatric/Behavioral:       Anxiety   All other systems reviewed and are negative.   Physical Exam  Constitutional: She is oriented to person, place, and time. She is cooperative. She has a sickly appearance.  Thin and frail in appearance   Cardiovascular: Normal rate, regular rhythm, normal heart sounds and normal pulses.  Pulmonary/Chest: Effort normal. She has decreased breath sounds.  Abdominal: Bowel sounds are decreased.  Musculoskeletal:  Generalized weakess   Neurological: She is alert and oriented to person, place, and time.  Skin: Skin is warm, dry and intact.  Pale in color  Psychiatric: Judgment normal. Her mood appears anxious. Cognition and memory  are normal.  Nursing note and vitals reviewed.   Vital Signs: BP 122/79 (BP Location: Right Arm)   Pulse 78   Temp 97.7 F (36.5 C) (Oral)   Resp 16   Ht '5\' 5"'  (1.651 m)   SpO2 95%   BMI 31.14 kg/m  Pain Scale: 0-10   Pain Score: 4    SpO2: SpO2: 95 % O2 Device:SpO2: 95 % O2 Flow Rate: .O2 Flow Rate (L/min): 2 L/min  IO: Intake/output summary:   Intake/Output Summary (Last 24 hours) at 09/01/2017 1747 Last data filed at 09/01/2017 1100 Gross per 24 hour  Intake 2866.45 ml  Output -  Net 2866.45 ml    LBM: Last BM Date: 08/28/17 Baseline Weight:   Most recent weight:       Palliative Assessment/Data:PPS 30 % with reduced po intake    Time In: 1505 Time Out: 1625 Time Total: 80 min.   Greater than 50%  of this time was spent counseling and coordinating care related to the above assessment and plan.  Signed by:  Alda Lea, NP-BC Palliative Medicine Team  Phone: [phone] Fax: [phone] Pager: [phone] Amion: Bjorn Pippin    Please contact Palliative Medicine Team phone at [phone] for  questions and concerns.  For individual provider: See Shea Evans

## 2017-09-01 NOTE — Progress Notes (Signed)
PROGRESS NOTE    Katie Palmer  DSK:[id number] DOB: 1950-05-11 DOA: 08/29/2017 PCP: Laurey Morale, MD   Brief Narrative:  HPI On 08/29/2017 by Dr. Dessa Phi Katie Palmer is a 67 y.o. female with medical history significant of endometrial cancer, most recent chemotherapy 3 weeks ago with side effects including uncontrolled nausea, vomiting, dehydration. Chemotherapy has been on hold.  Also with past medical history of diabetes, anxiety and depression, essential hypertension.  She states that she has had some nausea and vomiting since her surgery back in April.  This has been getting worse in the past few weeks after chemotherapy treatment.  She states that she vomits intermittently, twice yesterday and once this morning.  She denies any abdominal pain, diarrhea.  No fevers at home.  Denies any chest pain or shortness of breath or cough.  She states that she has not eaten anything in days.  Interim history Admitted for dehydration secondary to nausea, vomiting. Oncology consulted.   Assessment & Plan   Dehydration secondary to intractable nausea and vomiting -Was placed on antiemetics and supportive care, IVF -Oncology recommended CT abd/pelvis to evaluate cancer progression as cause of N/V 9see discussion below)  Endometrial cancer -Oncology, Dr. Alvy Bimler, consulted and appreciated -Chemotherapy and radiation currently held -CT abdomen pelvis: Of disease with worsening necrotic nodal metastasis in the retroperitoneum and along the pelvic sidewall bilaterally (L>R), soft tissue metastasis in the low right psoas muscle, intraperitoneal metastatic lesion in the left lower quadrant.  Retroperitoneal lymphadenopathy associated with obstruction of the ureters bilaterally resulting in moderate to severe bilateral hydroureteronephrosis.  Large IVF thrombus -noted on CT scan: Very large nonocclusive thrombus in the infrahepatic IVC -Suspect secondary to cancer recurrence -Continue  Lovenox  Bilateral hydronephrosis secondary to ureteral obstruction from tumor recurrence -Creatinine currently 1.18 and stable  -Per conversation with Dr. Alvy Bimler, patient refused bilateral nephrostomy tube placement  Diabetes mellitus type 1, with neuropathy -Patient on insulin pump -Continue Neurontin for neuropathy  Essential hypertension -Hyzaar held in setting of dehydration -BP currently stable  Depression/anxiety -Continue Xanax, Cymbalta  Normocytic anemia/Anemia of chronic disease -hemoglobin currently 9.2, continue to monitor CBC  Severe protein calorie malnutrition -nutrition consulted, continue feeding supplements  Cancer related pain -oncology started fentanyl patch, morphine  Goals of care -palliative care consulted and appreciated   DVT Prophylaxis  lovenox  Code Status: Full  Family Communication: none at bedside  Disposition Plan: Admitted. Pending palliative care consult. suspect home in 1-2 days  Consultants Oncology Palliative care   Procedures  None  Antibiotics   Anti-infectives (From admission, onward)   None      Subjective:   Katie Palmer seen and examined today.  He is to have abdominal pain.  Denies current nausea or vomiting.  Does not have an appetite.  Inquires about when she can go home.  Currently denies chest pain, shortness breath, dizziness, headache.  Objective:   Vitals:   08/31/17 0459 08/31/17 1400 08/31/17 2055 09/01/17 0618  BP: 140/70 (!) 142/85 (!) 145/84 138/82  Pulse: 83 81 82 85  Resp: 16 16 20 20   Temp: 98.5 F (36.9 C) 98.8 F (37.1 C) 98.5 F (36.9 C) 98.3 F (36.8 C)  TempSrc: Oral Oral Oral Oral  SpO2: 99% 98% 98% 98%  Height:        Intake/Output Summary (Last 24 hours) at 09/01/2017 1304 Last data filed at 09/01/2017 1100 Gross per 24 hour  Intake 2866.45 ml  Output -  Net 2866.45 ml  There were no vitals filed for this visit.  Exam  General: Well developed, well nourished, NAD,  appears stated age  67: NCAT, mucous membranes moist.   Neck: Supple  Cardiovascular: S1 S2 auscultated, no rubs, murmurs or gallops. Regular rate and rhythm.  Respiratory: Clear to auscultation bilaterally with equal chest rise  Abdomen: Soft, nontender, nondistended, + bowel sounds  Extremities: warm dry without cyanosis clubbing or edema  Neuro: AAOx3, nonfocal  Psych: Tearful, anxious   Data Reviewed: I have personally reviewed following labs and imaging studies  CBC: Recent Labs  Lab 08/29/17 1622 08/30/17 0540 08/31/17 0500 09/01/17 0555  WBC 4.9 4.4 5.2 7.1  NEUTROABS 3.8  --   --   --   HGB 9.0* 8.1* 7.9* 9.2*  HCT 26.8* 24.5* 24.4* 28.5*  MCV 89.0 90.4 91.4 93.1  PLT 274 226 233 694   Basic Metabolic Panel: Recent Labs  Lab 08/29/17 1618 08/29/17 1622 08/30/17 0540 08/31/17 0500 09/01/17 0555  NA  --  138 143 140 138  K  --  2.7* 3.2* 3.5 4.6  CL  --  97* 105 103 105  CO2  --  28 29 24  14*  GLUCOSE  --  159* 164* 189* 314*  BUN  --  8 6* 5* 7*  CREATININE  --  0.94 0.85 0.89 1.18*  CALCIUM  --  8.2* 8.0* 8.1* 8.5*  MG 1.7  --   --   --  1.7   GFR: Estimated Creatinine Clearance: 49.1 mL/min (A) (by C-G formula based on SCr of 1.18 mg/dL (H)). Liver Function Tests: Recent Labs  Lab 08/29/17 1622  AST 12*  ALT 8  ALKPHOS 74  BILITOT 1.0  PROT 6.0*  ALBUMIN 2.5*   Recent Labs  Lab 08/29/17 1622  LIPASE 18   No results for input(s): AMMONIA in the last 168 hours. Coagulation Profile: No results for input(s): INR, PROTIME in the last 168 hours. Cardiac Enzymes: No results for input(s): CKTOTAL, CKMB, CKMBINDEX, TROPONINI in the last 168 hours. BNP (last 3 results) No results for input(s): PROBNP in the last 8760 hours. HbA1C: No results for input(s): HGBA1C in the last 72 hours. CBG: Recent Labs  Lab 08/31/17 1742 08/31/17 2049 09/01/17 0221 09/01/17 0756 09/01/17 1151  GLUCAP 223* 251* 253* 281* 295*   Lipid  Profile: No results for input(s): CHOL, HDL, LDLCALC, TRIG, CHOLHDL, LDLDIRECT in the last 72 hours. Thyroid Function Tests: No results for input(s): TSH, T4TOTAL, FREET4, T3FREE, THYROIDAB in the last 72 hours. Anemia Panel: No results for input(s): VITAMINB12, FOLATE, FERRITIN, TIBC, IRON, RETICCTPCT in the last 72 hours. Urine analysis:    Component Value Date/Time   COLORURINE YELLOW 08/30/2017 [place] 08/30/2017 0713   LABSPEC 1.008 08/30/2017 0713   PHURINE 6.0 08/30/2017 0713   GLUCOSEU NEGATIVE 08/30/2017 0713   HGBUR NEGATIVE 08/30/2017 0713   HGBUR negative 02/06/2009 0915   BILIRUBINUR NEGATIVE 08/30/2017 0713   BILIRUBINUR 1+ 10/29/2016 1203   KETONESUR 20 (A) 08/30/2017 0713   PROTEINUR NEGATIVE 08/30/2017 0713   UROBILINOGEN 0.2 10/29/2016 1203   UROBILINOGEN 0.2 05/20/2014 0809   NITRITE NEGATIVE 08/30/2017 0713   LEUKOCYTESUR NEGATIVE 08/30/2017 0713   Sepsis Labs: @LABRCNTIP (procalcitonin:4,lacticidven:4)  ) Recent Results (from the past 240 hour(s))  Culture, blood (routine x 2)     Status: None (Preliminary result)   Collection Time: 08/29/17  4:22 PM  Result Value Ref Range Status   Specimen Description   Final  BLOOD PORTA CATH Performed at [facility], [place] [address], [place], [place] [address]    Special Requests   Final    BOTTLES DRAWN AEROBIC AND ANAEROBIC Blood Culture adequate volume Performed at [facility] [address], [place], [place] [address]    Culture   Final    NO GROWTH 2 DAYS Performed at [facility] [address], [place], [place] [address]    Report Status PENDING  Incomplete  Culture, blood (routine x 2)     Status: None (Preliminary result)   Collection Time: 08/29/17  4:22 PM  Result Value Ref Range Status   Specimen Description   Final    BLOOD BLOOD LEFT ARM Performed at [facility] [address] [place], [place] [address]     Special Requests   Final    BOTTLES DRAWN AEROBIC AND ANAEROBIC Blood Culture results may not be optimal due to an excessive volume of blood received in culture bottles Performed at [facility] [address] [place], [place] [address]    Culture   Final    NO GROWTH 2 DAYS Performed at [facility] [address] [place], [place] [address]    Report Status PENDING  Incomplete  Urine culture     Status: None   Collection Time: 08/30/17  7:13 AM  Result Value Ref Range Status   Specimen Description   Final    URINE, CATHETERIZED Performed at [facility] [address], [place], [place] [address]    Special Requests   Final    Normal Performed at [facility], [place] [address] [place], [place] [address]    Culture   Final    NO GROWTH Performed at [facility], [place] [address], [place], [place] [address]    Report Status 08/31/2017 FINAL  Final      Radiology Studies: Ct Abdomen Pelvis W Contrast  Result Date: 08/31/2017 CLINICAL DATA:  67 year old female with history of endometrial carcinoma. Chemotherapy discontinued 1 month ago. Radiation therapy planned for the morning. Nausea and vomiting. 13 pound weight loss over the past 3 weeks. Decreased appetite. Evaluate for recurrent disease. EXAM: CT ABDOMEN AND PELVIS WITH CONTRAST TECHNIQUE: Multidetector CT imaging of the abdomen and pelvis was performed using the standard protocol following bolus administration of intravenous contrast. CONTRAST:  145mL OMNIPAQUE IOHEXOL 300 MG/ML SOLN, 71mL ISOVUE-300 IOPAMIDOL (ISOVUE-300) INJECTION 61% COMPARISON:  CT the abdomen and pelvis 06/24/2017. FINDINGS: Lower chest: Unremarkable. Hepatobiliary: Perfusion anomaly adjacent to the falciform ligament in the left lobe of the liver, similar to prior examinations (a benign finding). No suspicious cystic or solid hepatic lesions. Very mild intrahepatic biliary ductal dilatation,  similar to the prior study. No common bile duct dilatation. Gallbladder is moderately distended. No gallstones. No surrounding pericholecystic fluid or inflammatory changes. Pancreas: No pancreatic mass. No pancreatic ductal dilatation. No pancreatic or peripancreatic fluid or inflammatory changes. Spleen: Unremarkable. Adrenals/Urinary Tract: Moderate to severe bilateral hydroureteronephrosis. On the right, this appears related to obstruction of the mid right ureter related to retroperitoneal nodal masses which are centrally low-attenuation (likely internally necrotic) and have increased in size, with the largest of these currently measuring 3.0 x 2.1 x 5.8 cm (axial image 56 of series 2 and coronal image 45 of series 4). On the left, the ureteral obstruction is in the distal third of the ureter related to masses along the pelvic sidewall (discussed  below) which appear to exert mass effect upon the distal ureter. No suspicious renal lesions. Bilateral adrenal glands are normal in appearance. Urinary bladder is normal in appearance. Stomach/Bowel: Normal appearance of the stomach. No pathologic dilatation of small bowel or colon. Normal appendix. Vascular/Lymphatic: Aortic atherosclerosis. No aneurysm or dissection noted in the abdominal or pelvic vasculature. Large filling defect in the inferior vena cava measuring 1.7 x 2.1 x 1.1 cm (axial image 40 of series 2 and coronal image 46 of series 4), compatible with a nonocclusive thrombus. Increased number and size of centrally low-attenuation lesions in the retroperitoneum bilaterally and along the pelvic sidewalls (left greater than right), favored to reflect necrotic nodal metastases. The largest of these is in the left retroperitoneum where it partially invades the left psoas muscle (axial image 56 of series 2 and coronal image 48 of series 4) measuring 2.5 x 4.9 x 3.6 cm. Several lesions are also noted along the left pelvic sidewall in the distribution of the left  external iliac lymph nodes measuring 2.1 x 2.4 cm (axial image 76 of series 2) and 3.1 x 2.3 cm (axial image 71 of series 2). Reproductive: Status post hysterectomy. Ovaries are not confidently identified may be surgically absent or atrophic. Other: In addition to the presumed nodal metastases discussed above, there are other lesions, including a 1.8 x 2.6 cm lesion in the inferior aspect of the right psoas muscle (axial image 65 of series 2), and a lesion in the left lower quadrant (axial image 67 of series 2) measuring 1.9 x 2.3 cm, which likely reflects a peritoneal implant. Extensive thickening of the presacral soft tissues, new compared to the prior study, potentially treatment related if the patient has had radiation therapy, although this may simply be related to edema from lymphatic congestion. No significant volume of ascites. No pneumoperitoneum. Musculoskeletal: No aggressive appearing lytic or blastic lesions are noted in the visualized portions of the skeleton. IMPRESSION: 1. Findings are highly concerning for progression of disease, with worsening necrotic nodal metastasis in the retroperitoneum and along the pelvic sidewall bilaterally (left greater than right), soft tissue metastasis in the low right psoas muscle, and intraperitoneal metastatic lesion in the left lower quadrant. This retroperitoneal lymphadenopathy is now associated with obstruction of the ureters bilaterally resulting in moderate to severe bilateral hydroureteronephrosis. 2. 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 This places the patient at substantial risk for devastating pulmonary embolism. 3. Additional incidental findings, similar to prior studies, as above. Critical Value/emergent results were called by telephone at the time of interpretation on 08/31/2017 at 8:22 pm to Dr. Earlie Server, who verbally acknowledged these results. Electronically Signed   By: Vinnie Langton M.D.   On: 08/31/2017  20:22     Scheduled Meds: . DULoxetine  20 mg Oral BID  . enoxaparin (LOVENOX) injection  1 mg/kg Subcutaneous Q12H  . famotidine  20 mg Oral BID  . feeding supplement  1 Container Oral TID BM  . fentaNYL  25 mcg Transdermal Q72H  . insulin pump   Subcutaneous TID AC, HS, 0200  . mouth rinse  15 mL Mouth Rinse BID  . senna-docusate  2 tablet Oral BID  . traZODone  100 mg Oral QHS   Continuous Infusions: . sodium chloride 100 mL/hr at 09/01/17 1100     LOS: 2 days   Time Spent in minutes   35 minutes (greater than 50% of time spent with patient face to face, as well as  reviewing records, discussing case with consults, and formulating a plan).  Yunuen Mordan D.O. on 09/01/2017 at 1:04 PM  Between 7am to 7pm - Pager - [phone]  After 7pm go to [email or web address] - password [email or web address]  And look for the night coverage person covering for me after hours  [facility] Office  [phone]

## 2017-09-01 NOTE — Progress Notes (Signed)
Katie Palmer   DOB:09/29/1950   FG#:[id number]    Assessment & Plan:   Endometrial cancer ([place]) She tolerated chemotherapy very poorly with exaggerated side effects including uncontrolled nausea, vomiting, dehydration, near syncopal episode and others Unfortunately, CT scan yesterday showed significant disease recurrence with signs of carcinomatosis. This is very bad news as the CT scan was only slightly over 2 months prior and she had received 3 doses of chemotherapy.  The patient has chemotherapy refractory disease I told the patient that her disease is terminal. I recommend consideration for second opinion elsewhere but the patient declined I recommend palliative care consult to consider palliative care/hospice I will cancel her radiation therapy appointment  Large IVC clot This is due to cancer recurrence and recent dehydration Continue Lovenox for now We discussed the risk and benefits of anticoagulation therapy and I recommend we continue Lovenox for now  Bilateral hydronephrosis secondary to ureteric obstruction from tumor recurrence Currently, she does not have renal failure but is at high risk due to bilateral hydronephrosis We discussed the risk and benefit of bilateral nephrostomy tube placement but the patient declined  Cancer pain Due to potential unreliable oral absorption, I have started her on fentanyl patch along with intermittent PRN IV morphine as needed for pain  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  I felt the NG tube placement at this point is not necessary  Progressive anemia Could be due to hemodilution versus anemia chronic  illness She does not need blood transfusion for now  Severe constipation This is secondary to carcinomatosis I have discontinued laxatives  Goals of care discussion I plan to return tomorrow morning at 7 AM for family conference with the patient and son for further discussion about goals of care  Discharge planning Pending family discussion tomorrow She will likely go home on home hospice versus residential hospice facility  Heath Lark, MD 09/01/2017  8:06 AM   Subjective:  She complained of abdominal pain.  Continues to have intermittent nausea.  She has no bowel movement for almost 5 days The patient denies any recent signs or symptoms of bleeding such as spontaneous epistaxis, hematuria or hematochezia.  Objective:  Vitals:   08/31/17 2055 09/01/17 0618  BP: (!) 145/84 138/82  Pulse: 82 85  Resp: 20 20  Temp: 98.5 F (36.9 C) 98.3 F (36.8 C)  SpO2: 98% 98%     Intake/Output Summary (Last 24 hours) at 09/01/2017 0806 Last data filed at 09/01/2017 0404 Gross per 24 hour  Intake 2175.53 ml  Output -  Net 2175.53 ml    GENERAL:alert, no distress and comfortable. Noted alopecia Musculoskeletal:no cyanosis of digits and no clubbing  NEURO: alert & oriented x 3 with fluent speech, no focal motor/sensory deficits   Labs:  Lab Results  Component Value Date   WBC 7.1 09/01/2017   HGB 9.2 (L) 09/01/2017   HCT 28.5 (L) 09/01/2017   MCV 93.1 09/01/2017   PLT 308 09/01/2017   NEUTROABS 3.8 08/29/2017    Lab Results  Component Value Date   NA 138 09/01/2017   K 4.6 09/01/2017   CL 105 09/01/2017   CO2 14 (L) 09/01/2017    Studies:  Ct Abdomen Pelvis W Contrast  Result  Date: 08/31/2017 CLINICAL DATA:  67 year old female with history of endometrial carcinoma. Chemotherapy discontinued 1 month ago. Radiation therapy planned for the morning. Nausea and vomiting. 13 pound weight loss over the past 3 weeks. Decreased appetite. Evaluate for recurrent disease. EXAM: CT  ABDOMEN AND PELVIS WITH CONTRAST TECHNIQUE: Multidetector CT imaging of the abdomen and pelvis was performed using the standard protocol following bolus administration of intravenous contrast. CONTRAST:  163mL OMNIPAQUE IOHEXOL 300 MG/ML SOLN, 12mL ISOVUE-300 IOPAMIDOL (ISOVUE-300) INJECTION 61% COMPARISON:  CT the abdomen and pelvis 06/24/2017. FINDINGS: Lower chest: Unremarkable. Hepatobiliary: Perfusion anomaly adjacent to the falciform ligament in the left lobe of the liver, similar to prior examinations (a benign finding). No suspicious cystic or solid hepatic lesions. Very mild intrahepatic biliary ductal dilatation, similar to the prior study. No common bile duct dilatation. Gallbladder is moderately distended. No gallstones. No surrounding pericholecystic fluid or inflammatory changes. Pancreas: No pancreatic mass. No pancreatic ductal dilatation. No pancreatic or peripancreatic fluid or inflammatory changes. Spleen: Unremarkable. Adrenals/Urinary Tract: Moderate to severe bilateral hydroureteronephrosis. On the right, this appears related to obstruction of the mid right ureter related to retroperitoneal nodal masses which are centrally low-attenuation (likely internally necrotic) and have increased in size, with the largest of these currently measuring 3.0 x 2.1 x 5.8 cm (axial image 56 of series 2 and coronal image 45 of series 4). On the left, the ureteral obstruction is in the distal third of the ureter related to masses along the pelvic sidewall (discussed below) which appear to exert mass effect upon the distal ureter. No suspicious renal lesions. Bilateral adrenal glands are normal in appearance. Urinary bladder is normal in appearance. Stomach/Bowel: Normal appearance of the stomach. No pathologic dilatation of small bowel or colon. Normal appendix. Vascular/Lymphatic: Aortic atherosclerosis. No aneurysm or dissection noted in the abdominal or pelvic vasculature. Large filling defect in the inferior  vena cava measuring 1.7 x 2.1 x 1.1 cm (axial image 40 of series 2 and coronal image 46 of series 4), compatible with a nonocclusive thrombus. Increased number and size of centrally low-attenuation lesions in the retroperitoneum bilaterally and along the pelvic sidewalls (left greater than right), favored to reflect necrotic nodal metastases. The largest of these is in the left retroperitoneum where it partially invades the left psoas muscle (axial image 56 of series 2 and coronal image 48 of series 4) measuring 2.5 x 4.9 x 3.6 cm. Several lesions are also noted along the left pelvic sidewall in the distribution of the left external iliac lymph nodes measuring 2.1 x 2.4 cm (axial image 76 of series 2) and 3.1 x 2.3 cm (axial image 71 of series 2). Reproductive: Status post hysterectomy. Ovaries are not confidently identified may be surgically absent or atrophic. Other: In addition to the presumed nodal metastases discussed above, there are other lesions, including a 1.8 x 2.6 cm lesion in the inferior aspect of the right psoas muscle (axial image 65 of series 2), and a lesion in the left lower quadrant (axial image 67 of series 2) measuring 1.9 x 2.3 cm, which likely reflects a peritoneal implant. Extensive thickening of the presacral soft tissues, new compared to the prior study, potentially treatment related if the patient has had radiation therapy, although this may simply be related to edema from lymphatic congestion. No significant volume of ascites. No pneumoperitoneum. Musculoskeletal: No aggressive appearing lytic or blastic lesions are noted in the visualized portions of the skeleton. IMPRESSION: 1. Findings are highly concerning for progression of disease, with  worsening necrotic nodal metastasis in the retroperitoneum and along the pelvic sidewall bilaterally (left greater than right), soft tissue metastasis in the low right psoas muscle, and intraperitoneal metastatic lesion in the left lower quadrant.  This retroperitoneal lymphadenopathy is now associated with obstruction of the ureters bilaterally resulting in moderate to severe bilateral hydroureteronephrosis. 2. 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 This places the patient at substantial risk for devastating pulmonary embolism. 3. Additional incidental findings, similar to prior studies, as above. Critical Value/emergent results were called by telephone at the time of interpretation on 08/31/2017 at 8:22 pm to Dr. Earlie Server, who verbally acknowledged these results. Electronically Signed   By: Vinnie Langton M.D.   On: 08/31/2017 20:22

## 2017-09-02 ENCOUNTER — Telehealth: Payer: Self-pay

## 2017-09-02 ENCOUNTER — Emergency Department (HOSPITAL_COMMUNITY): Payer: Medicare Other

## 2017-09-02 ENCOUNTER — Inpatient Hospital Stay (HOSPITAL_COMMUNITY)
Admission: EM | Admit: 2017-09-02 | Discharge: 2017-09-06 | DRG: 637 | Disposition: A | Discharge status: Hospice | Payer: Medicare Other | Attending: Internal Medicine | Admitting: Internal Medicine

## 2017-09-02 ENCOUNTER — Ambulatory Visit: Payer: Medicare Other

## 2017-09-02 ENCOUNTER — Ambulatory Visit: Payer: Medicare Other | Admitting: Radiation Oncology

## 2017-09-02 ENCOUNTER — Encounter (HOSPITAL_COMMUNITY): Payer: Self-pay

## 2017-09-02 DIAGNOSIS — N131 Hydronephrosis with ureteral stricture, not elsewhere classified: Secondary | ICD-10-CM | POA: Diagnosis present

## 2017-09-02 DIAGNOSIS — N183 Chronic kidney disease, stage 3 (moderate): Secondary | ICD-10-CM | POA: Diagnosis present

## 2017-09-02 DIAGNOSIS — I82220 Acute embolism and thrombosis of inferior vena cava: Secondary | ICD-10-CM | POA: Diagnosis present

## 2017-09-02 DIAGNOSIS — I129 Hypertensive chronic kidney disease with stage 1 through stage 4 chronic kidney disease, or unspecified chronic kidney disease: Secondary | ICD-10-CM | POA: Diagnosis present

## 2017-09-02 DIAGNOSIS — T383X6A Underdosing of insulin and oral hypoglycemic [antidiabetic] drugs, initial encounter: Secondary | ICD-10-CM | POA: Diagnosis present

## 2017-09-02 DIAGNOSIS — R269 Unspecified abnormalities of gait and mobility: Secondary | ICD-10-CM | POA: Diagnosis present

## 2017-09-02 DIAGNOSIS — E1011 Type 1 diabetes mellitus with ketoacidosis with coma: Secondary | ICD-10-CM | POA: Diagnosis not present

## 2017-09-02 DIAGNOSIS — N1330 Unspecified hydronephrosis: Secondary | ICD-10-CM

## 2017-09-02 DIAGNOSIS — C541 Malignant neoplasm of endometrium: Secondary | ICD-10-CM | POA: Diagnosis present

## 2017-09-02 DIAGNOSIS — Z9221 Personal history of antineoplastic chemotherapy: Secondary | ICD-10-CM

## 2017-09-02 DIAGNOSIS — F419 Anxiety disorder, unspecified: Secondary | ICD-10-CM | POA: Diagnosis present

## 2017-09-02 DIAGNOSIS — E1110 Type 2 diabetes mellitus with ketoacidosis without coma: Secondary | ICD-10-CM | POA: Diagnosis present

## 2017-09-02 DIAGNOSIS — M797 Fibromyalgia: Secondary | ICD-10-CM | POA: Diagnosis present

## 2017-09-02 DIAGNOSIS — Z8249 Family history of ischemic heart disease and other diseases of the circulatory system: Secondary | ICD-10-CM

## 2017-09-02 DIAGNOSIS — C679 Malignant neoplasm of bladder, unspecified: Secondary | ICD-10-CM | POA: Diagnosis present

## 2017-09-02 DIAGNOSIS — C800 Disseminated malignant neoplasm, unspecified: Secondary | ICD-10-CM

## 2017-09-02 DIAGNOSIS — Z66 Do not resuscitate: Secondary | ICD-10-CM

## 2017-09-02 DIAGNOSIS — Z515 Encounter for palliative care: Secondary | ICD-10-CM

## 2017-09-02 DIAGNOSIS — D49511 Neoplasm of unspecified behavior of right kidney: Secondary | ICD-10-CM | POA: Diagnosis present

## 2017-09-02 DIAGNOSIS — D630 Anemia in neoplastic disease: Secondary | ICD-10-CM

## 2017-09-02 DIAGNOSIS — Z7902 Long term (current) use of antithrombotics/antiplatelets: Secondary | ICD-10-CM

## 2017-09-02 DIAGNOSIS — E10649 Type 1 diabetes mellitus with hypoglycemia without coma: Secondary | ICD-10-CM | POA: Diagnosis not present

## 2017-09-02 DIAGNOSIS — E43 Unspecified severe protein-calorie malnutrition: Secondary | ICD-10-CM | POA: Diagnosis present

## 2017-09-02 DIAGNOSIS — F329 Major depressive disorder, single episode, unspecified: Secondary | ICD-10-CM | POA: Diagnosis present

## 2017-09-02 DIAGNOSIS — E1022 Type 1 diabetes mellitus with diabetic chronic kidney disease: Secondary | ICD-10-CM | POA: Diagnosis present

## 2017-09-02 DIAGNOSIS — Z79891 Long term (current) use of opiate analgesic: Secondary | ICD-10-CM

## 2017-09-02 DIAGNOSIS — E876 Hypokalemia: Secondary | ICD-10-CM | POA: Diagnosis present

## 2017-09-02 DIAGNOSIS — D649 Anemia, unspecified: Secondary | ICD-10-CM | POA: Diagnosis present

## 2017-09-02 DIAGNOSIS — Z91128 Patient's intentional underdosing of medication regimen for other reason: Secondary | ICD-10-CM

## 2017-09-02 DIAGNOSIS — Z806 Family history of leukemia: Secondary | ICD-10-CM

## 2017-09-02 DIAGNOSIS — Y92009 Unspecified place in unspecified non-institutional (private) residence as the place of occurrence of the external cause: Secondary | ICD-10-CM

## 2017-09-02 DIAGNOSIS — Z6835 Body mass index (BMI) 35.0-35.9, adult: Secondary | ICD-10-CM

## 2017-09-02 DIAGNOSIS — Z9641 Presence of insulin pump (external) (internal): Secondary | ICD-10-CM | POA: Diagnosis present

## 2017-09-02 DIAGNOSIS — C786 Secondary malignant neoplasm of retroperitoneum and peritoneum: Secondary | ICD-10-CM | POA: Diagnosis present

## 2017-09-02 DIAGNOSIS — Z7189 Other specified counseling: Secondary | ICD-10-CM

## 2017-09-02 DIAGNOSIS — R9389 Abnormal findings on diagnostic imaging of other specified body structures: Secondary | ICD-10-CM | POA: Diagnosis present

## 2017-09-02 LAB — I-STAT CG4 LACTIC ACID, ED: Lactic Acid, Venous: 3.42 mmol/L (ref 0.5–1.9)

## 2017-09-02 LAB — CBC
HCT: 25.4 % — ABNORMAL LOW (ref 36.0–46.0)
HEMOGLOBIN: 8.4 g/dL — AB (ref 12.0–15.0)
MCH: 30 pg (ref 26.0–34.0)
MCHC: 33.1 g/dL (ref 30.0–36.0)
MCV: 90.7 fL (ref 78.0–100.0)
Platelets: 268 10*3/uL (ref 150–400)
RBC: 2.8 MIL/uL — ABNORMAL LOW (ref 3.87–5.11)
RDW: 21.2 % — ABNORMAL HIGH (ref 11.5–15.5)
WBC: 5.3 10*3/uL (ref 4.0–10.5)

## 2017-09-02 LAB — BASIC METABOLIC PANEL
ANION GAP: 13 (ref 5–15)
CALCIUM: 8.2 mg/dL — AB (ref 8.9–10.3)
CO2: 18 mmol/L — ABNORMAL LOW (ref 22–32)
Chloride: 107 mmol/L (ref 98–111)
Creatinine, Ser: 0.96 mg/dL (ref 0.44–1.00)
GFR calc Af Amer: >60 mL/min (ref >60)
GFR, EST NON AFRICAN AMERICAN: 59 mL/min — AB (ref >60)
GLUCOSE: 258 mg/dL — AB (ref 70–99)
Potassium: 3.8 mmol/L (ref 3.5–5.1)
SODIUM: 138 mmol/L (ref 135–145)

## 2017-09-02 LAB — GLUCOSE, CAPILLARY: GLUCOSE-CAPILLARY: 201 mg/dL — AB (ref 70–99)

## 2017-09-02 LAB — CBG MONITORING, ED: Glucose-Capillary: 443 mg/dL — ABNORMAL HIGH (ref 70–99)

## 2017-09-02 MED ORDER — SODIUM CHLORIDE 0.9 % IV BOLUS
1000.0000 mL | Freq: Once | INTRAVENOUS | Status: AC
  Administered 2017-09-03: 1000 mL via INTRAVENOUS

## 2017-09-02 MED ORDER — SODIUM CHLORIDE 0.9 % IV BOLUS
1000.0000 mL | Freq: Once | INTRAVENOUS | Status: AC
Start: 2017-09-03 — End: 2017-09-03
  Administered 2017-09-03: 1000 mL via INTRAVENOUS

## 2017-09-02 MED ORDER — HYDROMORPHONE HCL 1 MG/ML IJ SOLN
1.0000 mg | Freq: Once | INTRAMUSCULAR | Status: AC
  Administered 2017-09-02: 1 mg via INTRAVENOUS
  Filled 2017-09-02: qty 1

## 2017-09-02 MED ORDER — ENOXAPARIN SODIUM 100 MG/ML ~~LOC~~ SOLN: 1.0000 mg/kg | Freq: Two times a day (BID) | SUBCUTANEOUS | 0 refills | Status: DC

## 2017-09-02 MED ORDER — HEPARIN SOD (PORK) LOCK FLUSH 100 UNIT/ML IV SOLN
500.0000 [IU] | Freq: Once | INTRAVENOUS | Status: AC
  Administered 2017-09-02: 500 [IU]
  Filled 2017-09-02: qty 5

## 2017-09-02 MED ORDER — ONDANSETRON HCL 4 MG/2ML IJ SOLN
4.0000 mg | Freq: Once | INTRAMUSCULAR | Status: AC
  Administered 2017-09-02: 4 mg via INTRAVENOUS
  Filled 2017-09-02: qty 2

## 2017-09-02 MED ORDER — FENTANYL 25 MCG/HR TD PT72: 25.0000 ug | MEDICATED_PATCH | TRANSDERMAL | 0 refills | Status: AC

## 2017-09-02 MED ORDER — SODIUM CHLORIDE 0.9 % IV SOLN: INTRAVENOUS | Status: DC

## 2017-09-02 MED ORDER — DEXTROSE-NACL 5-0.45 % IV SOLN: INTRAVENOUS | Status: DC

## 2017-09-02 MED ORDER — SODIUM CHLORIDE 0.9 % IV BOLUS
1000.0000 mL | Freq: Once | INTRAVENOUS | Status: AC
  Administered 2017-09-02: 1000 mL via INTRAVENOUS

## 2017-09-02 MED ORDER — SODIUM CHLORIDE 0.9 % IV SOLN
INTRAVENOUS | Status: DC
  Administered 2017-09-03: 3.8 [IU]/h via INTRAVENOUS
  Filled 2017-09-02: qty 1

## 2017-09-02 NOTE — Progress Notes (Signed)
Ms Vogt refuses to have her CBG checked. She stated " I do not want to have my finger stuck . I have an insulin  Pump that's all I need"

## 2017-09-02 NOTE — Care Management Important Message (Signed)
Important Message  Patient Details  Name: Katie Palmer MRN: [id number] Date of Birth: Nov 07, 1950   Medicare Important Message Given:  Yes    Kerin Salen 09/02/2017, 11:14 AMImportant Message  Patient Details  Name: Katie Palmer MRN: [id number] Date of Birth: 26-Nov-1950   Medicare Important Message Given:  Yes    Kerin Salen 09/02/2017, 11:14 AM

## 2017-09-02 NOTE — Progress Notes (Signed)
[facility]  Met with patient to discuss POC for home to include [facility] and pharmacy services to resume/provide IV hydration fluids as per orders below in place prior to this readmission.  [facility] HHRN will see pt at home tomorrow to initiate IV fluids/antiemetics and support son, who is patients primary caregiver, with continued education regarding IV fluid and IV antiemetic administration.   administer IV fluids normal saline 1 liter over 2-4 hours daily or as needed x 60 days  administer Iv phenergan 12.5 mg to 25 mg IV daily or as needed for nausea x 60 days   If patient discharges after hours, please call [phone].   Larry Sierras 09/02/2017, 12:25 PM

## 2017-09-02 NOTE — Progress Notes (Signed)
CSW consult- Advance Directives.   CSW met with patient at beside. CSW offered to assist with Advance Directives process. Patient declines assistance with the process. Patient reports she plans to complete it at home in the presence of family.   CSW will follow patient wishes.   CSW will sign off.  Nicole Sinclair, LCSW, MSW Clinical Social Worker  [phone] 09/02/2017  12:00 PM 

## 2017-09-02 NOTE — ED Notes (Signed)
Bed: WA02 Expected date:  Expected time:  Means of arrival:  Comments: EMS 67 yo female from home/hx cancer-no treatment at  This time-wants an evaluation-fentanyl patch-wants pain management evaluation and family states they need help

## 2017-09-02 NOTE — Discharge Instructions (Signed)
Dehydration, Adult Dehydration is when there is not enough fluid or water in your body. This happens when you lose more fluids than you take in. Dehydration can range from mild to very bad. It should be treated right away to keep it from getting very bad. Symptoms of mild dehydration may include:  Thirst.  Dry lips.  Slightly dry mouth.  Dry, warm skin.  Dizziness. Symptoms of moderate dehydration may include:  Very dry mouth.  Muscle cramps.  Dark pee (urine). Pee may be the color of tea.  Your body making less pee.  Your eyes making fewer tears.  Heartbeat that is uneven or faster than normal (palpitations).  Headache.  Light-headedness, especially when you stand up from sitting.  Fainting (syncope). Symptoms of very bad dehydration may include:  Changes in skin, such as: ? Cold and clammy skin. ? Blotchy (mottled) or pale skin. ? Skin that does not quickly return to normal after being lightly pinched and let go (poor skin turgor).  Changes in body fluids, such as: ? Feeling very thirsty. ? Your eyes making fewer tears. ? Not sweating when body temperature is high, such as in hot weather. ? Your body making very little pee.  Changes in vital signs, such as: ? Weak pulse. ? Pulse that is more than 100 beats a minute when you are sitting still. ? Fast breathing. ? Low blood pressure.  Other changes, such as: ? Sunken eyes. ? Cold hands and feet. ? Confusion. ? Lack of energy (lethargy). ? Trouble waking up from sleep. ? Short-term weight loss. ? Unconsciousness. Follow these instructions at home:  If told by your doctor, drink an ORS: ? Make an ORS by using instructions on the package. ? Start by drinking small amounts, about  cup (120 mL) every 5-10 minutes. ? Slowly drink more until you have had the amount that your doctor said to have.  Drink enough clear fluid to keep your pee clear or pale yellow. If you were told to drink an ORS, finish the ORS  first, then start slowly drinking clear fluids. Drink fluids such as: ? Water. Do not drink only water by itself. Doing that can make the salt (sodium) level in your body get too low (hyponatremia). ? Ice chips. ? Fruit juice that you have added water to (diluted). ? Low-calorie sports drinks.  Avoid: ? Alcohol. ? Drinks that have a lot of sugar. These include high-calorie sports drinks, fruit juice that does not have water added, and soda. ? Caffeine. ? Foods that are greasy or have a lot of fat or sugar.  Take over-the-counter and prescription medicines only as told by your doctor.  Do not take salt tablets. Doing that can make the salt level in your body get too high (hypernatremia).  Eat foods that have minerals (electrolytes). Examples include bananas, oranges, potatoes, tomatoes, and spinach.  Keep all follow-up visits as told by your doctor. This is important. Contact a doctor if:  You have belly (abdominal) pain that: ? Gets worse. ? Stays in one area (localizes).  You have a rash.  You have a stiff neck.  You get angry or annoyed more easily than normal (irritability).  You are more sleepy than normal.  You have a harder time waking up than normal.  You feel: ? Weak. ? Dizzy. ? Very thirsty.  You have peed (urinated) only a small amount of very dark pee during 6-8 hours. Get help right away if:  You have symptoms of   very bad dehydration.  You cannot drink fluids without throwing up (vomiting).  Your symptoms get worse with treatment.  You have a fever.  You have a very bad headache.  You are throwing up or having watery poop (diarrhea) and it: ? Gets worse. ? Does not go away.  You have blood or something green (bile) in your throw-up.  You have blood in your poop (stool). This may cause poop to look black and tarry.  You have not peed in 6-8 hours.  You pass out (faint).  Your heart rate when you are sitting still is more than 100 beats a  minute.  You have trouble breathing. This information is not intended to replace advice given to you by your health care provider. Make sure you discuss any questions you have with your health care provider. Document Released: 11/08/2008 Document Revised: 08/02/2015 Document Reviewed: 03/08/2015 Elsevier Interactive Patient Education  2018 [employer]  

## 2017-09-02 NOTE — ED Triage Notes (Signed)
Pt arrived by EMS from home. Pt has history of stomach cancer and is not getting any treatment at this time. Pt was discharged from facility today. Pt is having increasing pain and is here for pain management. Pt has Fentanyl patch on at this time.

## 2017-09-02 NOTE — Telephone Encounter (Signed)
Son called and left two messages that the hospital made his Mom leave the hospital today without adjusting her pain medications. She is home now and in horrible pain. The pharmacy will not fill the fentanyl patch until 8/10.  Called back and told him several times to call 911 or take his Mom to the ER. He finally agreed that he would call 911 but he did not know if his Mom would go to the ER. Instructed to call office in the morning with a update. He verbalized understanding.

## 2017-09-02 NOTE — Progress Notes (Addendum)
.  D/C instructions  Reviewed  With TRAVIS pt"s son. He verbalized understanding, PORT A CATH was flushed per protocol .Date to change huber needle is 09/05/2017.A label is placed over the dressing. Pt is Dc"d per W/C with her son 50 .Pt was  wakened from sleep prior to  her son Katie Palmer came to get her .She was drowsy , and voiced no complaints when  assisted to the wheelchair.

## 2017-09-02 NOTE — Progress Notes (Signed)
Katie Palmer   DOB:1950/04/17   ZC#:[id number]    Assessment & Plan:  Endometrial cancer ([place]) She tolerated chemotherapy very poorly with exaggerated side effects including uncontrolled nausea, vomiting, dehydration, near syncopal episode and others Unfortunately, CT scan recentlyshowed significant disease recurrence with signs of carcinomatosis. This is very bad news as the CT scan was only slightly over 2 months prior and she had received 3 doses of chemotherapy.  The patient has chemotherapy refractory disease I told the patient that her disease is terminal. I recommend consideration for second opinion elsewhere but the patient declined I will cancel her radiation therapy appointment Ultimately, she accepted that she is not a candidate for further chemotherapy and will focus on palliative care/hospice  Large IVC clot This is due to cancer recurrence and recent dehydration Continue Lovenox for now We discussed the risk and benefits of anticoagulation therapy and I recommend we continue Lovenox for now.  My biggest concern is reduced kidney clearance if she ultimately developed complete renal failure secondary to bilateral hydronephrosis. 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  I felt the NG tube placement at this point is not necessary We discussed the risk and benefits of continuing IV fluid support. For now, she would like to continue IV fluid support through advanced home care agency with intermittent doses of IV antiemetics as needed  Progressive anemia Could be due to hemodilution versus anemia chronic illness She does not need blood transfusion for now  Severe constipation, resolved This is secondary to carcinomatosis I have discontinued laxatives  Goals of care discussion We had extensive discussions about goals of care The patient stated she needs to complete some paperwork and family affairs at home before transitioning to hospice For now, we will discharge her with advanced home care services until she is ready I have consulted social worker to help with advanced directives and medical healthcare power of attorney We had interesting discussion about goals of care The palliative care consultation from yesterday clearly documented the discussion about DNR and her CODE STATUS was changed to DNR However, the patient did not recall that conversation.  Neither is her son I have called palliative care service to clarify Ultimately, I have further discussion with the patient today and she confirmed DNR status  Discharge planning Since the patient is not ready to accept hospice service as she wants to continue IV fluid support, we will consult advanced home care service to resume IV fluid  support. I will discuss this  further with advanced home care agency to get palliative care consult at home to facility eventual transition to hospice We discussed poor prognosis I spent almost an hour with total direct face-to-face with patient, family member, consultation with palliative care service and primary service  Heath Lark, MD 09/02/2017  8:03 AM   Subjective:  She continues to have mild abdominal pain, improved since prescription fentanyl patch.  Denies bleeding. Patient did not recall much of the conversation with palliative care service and did not remember the plan to change her CODE STATUS to DNR.  Her son concurs.  She has some mild persistent nausea.  She had one bowel movement yesterday  Objective:  Vitals:   09/01/17 2054 09/02/17 0550  BP: 135/75 132/80  Pulse: 84 86  Resp: 18 16  Temp: 98 F (36.7 C) 97.9 F (36.6 C)  SpO2: 98% 99%     Intake/Output Summary (Last 24 hours) at 09/02/2017 6568 Last data filed at 09/02/2017 1275 Gross per 24 hour  Intake 3352.66 ml  Output -  Net 3352.66 ml    GENERAL:alert, no distress and comfortable NEURO: alert & oriented x 3 with fluent speech, no focal motor/sensory deficits   Labs:  Lab Results  Component Value Date   WBC 5.3 09/02/2017   HGB 8.4 (L) 09/02/2017   HCT 25.4 (L) 09/02/2017   MCV 90.7 09/02/2017   PLT 268 09/02/2017   NEUTROABS 3.8 08/29/2017    Lab Results  Component Value Date   NA 138 09/02/2017   K 3.8 09/02/2017   CL 107 09/02/2017   CO2 18 (L) 09/02/2017    Studies:  Ct Abdomen Pelvis W Contrast  Result Date: 08/31/2017 CLINICAL DATA:  68 year old female with history of endometrial carcinoma. Chemotherapy discontinued 1 month ago. Radiation therapy planned for the morning. Nausea and vomiting. 13 pound weight loss over the past 3 weeks. Decreased appetite. Evaluate for recurrent disease. EXAM: CT ABDOMEN AND PELVIS WITH CONTRAST TECHNIQUE: Multidetector CT imaging of the abdomen and pelvis was performed using the  standard protocol following bolus administration of intravenous contrast. CONTRAST:  129mL OMNIPAQUE IOHEXOL 300 MG/ML SOLN, 43mL ISOVUE-300 IOPAMIDOL (ISOVUE-300) INJECTION 61% COMPARISON:  CT the abdomen and pelvis 06/24/2017. FINDINGS: Lower chest: Unremarkable. Hepatobiliary: Perfusion anomaly adjacent to the falciform ligament in the left lobe of the liver, similar to prior examinations (a benign finding). No suspicious cystic or solid hepatic lesions. Very mild intrahepatic biliary ductal dilatation, similar to the prior study. No common bile duct dilatation. Gallbladder is moderately distended. No gallstones. No surrounding pericholecystic fluid or inflammatory changes. Pancreas: No pancreatic mass. No pancreatic ductal dilatation. No pancreatic or peripancreatic fluid or inflammatory changes. Spleen: Unremarkable. Adrenals/Urinary Tract: Moderate to severe bilateral hydroureteronephrosis. On the right, this appears related to obstruction of the mid right ureter related to retroperitoneal nodal masses which are centrally low-attenuation (likely internally necrotic) and have increased in size, with the largest of these currently measuring 3.0 x 2.1 x 5.8 cm (axial image 56 of series 2 and coronal image 45 of series 4). On the left, the ureteral obstruction is in the distal third of the ureter related to masses along the pelvic sidewall (discussed below) which appear to exert mass effect upon the distal ureter. No suspicious renal lesions. Bilateral adrenal glands are normal in appearance. Urinary bladder is normal in appearance. Stomach/Bowel: Normal appearance of the stomach. No pathologic dilatation of small bowel or colon. Normal appendix. Vascular/Lymphatic: Aortic atherosclerosis. No aneurysm  or dissection noted in the abdominal or pelvic vasculature. Large filling defect in the inferior vena cava measuring 1.7 x 2.1 x 1.1 cm (axial image 40 of series 2 and coronal image 46 of series 4), compatible with a  nonocclusive thrombus. Increased number and size of centrally low-attenuation lesions in the retroperitoneum bilaterally and along the pelvic sidewalls (left greater than right), favored to reflect necrotic nodal metastases. The largest of these is in the left retroperitoneum where it partially invades the left psoas muscle (axial image 56 of series 2 and coronal image 48 of series 4) measuring 2.5 x 4.9 x 3.6 cm. Several lesions are also noted along the left pelvic sidewall in the distribution of the left external iliac lymph nodes measuring 2.1 x 2.4 cm (axial image 76 of series 2) and 3.1 x 2.3 cm (axial image 71 of series 2). Reproductive: Status post hysterectomy. Ovaries are not confidently identified may be surgically absent or atrophic. Other: In addition to the presumed nodal metastases discussed above, there are other lesions, including a 1.8 x 2.6 cm lesion in the inferior aspect of the right psoas muscle (axial image 65 of series 2), and a lesion in the left lower quadrant (axial image 67 of series 2) measuring 1.9 x 2.3 cm, which likely reflects a peritoneal implant. Extensive thickening of the presacral soft tissues, new compared to the prior study, potentially treatment related if the patient has had radiation therapy, although this may simply be related to edema from lymphatic congestion. No significant volume of ascites. No pneumoperitoneum. Musculoskeletal: No aggressive appearing lytic or blastic lesions are noted in the visualized portions of the skeleton. IMPRESSION: 1. Findings are highly concerning for progression of disease, with worsening necrotic nodal metastasis in the retroperitoneum and along the pelvic sidewall bilaterally (left greater than right), soft tissue metastasis in the low right psoas muscle, and intraperitoneal metastatic lesion in the left lower quadrant. This retroperitoneal lymphadenopathy is now associated with obstruction of the ureters bilaterally resulting in moderate  to severe bilateral hydroureteronephrosis. 2. 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 This places the patient at substantial risk for devastating pulmonary embolism. 3. Additional incidental findings, similar to prior studies, as above. Critical Value/emergent results were called by telephone at the time of interpretation on 08/31/2017 at 8:22 pm to Dr. Earlie Server, who verbally acknowledged these results. Electronically Signed   By: Vinnie Langton M.D.   On: 08/31/2017 20:22

## 2017-09-02 NOTE — ED Provider Notes (Signed)
[facility] DEPT Provider Note   CSN: [id number] Arrival date & time: 09/02/17  2201     History   Chief Complaint Chief Complaint  Patient presents with  . Pain    HPI Katie Palmer is a 67 y.o. female.  Level 5 caveat for acuity condition.  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  She was discharged from the hospital today on a fentanyl patch of 25 mcg.  Family is upset because I did not talk with her oncologist Dr. Alvy Bimler before she left.  They state her pain is uncontrolled and she is having nausea and vertigo.  Denies any falls.  She cannot pinpoint what is hurting the most.  There has been no nausea, vomiting, fever, diarrhea.  Patient appears to have some confusion and altered mental status which is not her baseline.  The history is provided by the patient and a relative. The history is limited by the condition of the patient.    Past Medical History:  Diagnosis Date  . Depression   . Fibromyalgia   . Headache(784.0)   . History of colon polyps   . History of diabetes with ketoacidosis   . Hx of colonic polyps   . Hypertension   . Insomnia   . Insulin pump in place   . RBBB (right bundle branch block)   . RLQ abdominal pain   . Thickened endometrium   . Type 1 diabetes mellitus with long-term current use of insulin [facility]) endocrinologist-- dr Chalmers Cater   July 2015--  pt took Invokana and caused complete pancreas shut-down , pt went from pre-diabetic to adult type 1 dm now insulin dependent  . Vertigo     Patient Active Problem List   Diagnosis Date Noted  . Protein-calorie malnutrition, severe 08/30/2017  . Nausea with vomiting 08/26/2017  . Drug-induced hypotension 07/30/2017  . Generalized weakness 07/27/2017  . Elevated serum creatinine 07/07/2017  . Generalized anxiety disorder 07/07/2017  . Other constipation 07/07/2017  .  Acute leg pain, left 04/29/2017  . Left leg pain 04/29/2017  . Hearing loss in right ear 04/16/2017  . Obesity, morbid ([place]) 04/15/2017  . Endometrial cancer ([place]) 04/15/2017  . Endometrial adenocarcinoma ([place]) 03/23/2017  . Vertigo 10/29/2016  . Memory loss 10/29/2016  . Posterior tibial tendinitis, right leg 10/07/2016  . Vitamin D deficiency 01/07/2016  . Hypertension 06/03/2015  . 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 DKA, type 1 ([place]) 05/20/2014  . Depression 05/20/2014  . AKI (acute kidney injury) ([place]) 05/20/2014  . Dyslipidemia 04/12/2014  . Leukocytosis 07/28/2013  . Dehydration 07/28/2013  . Fibromyalgia 05/27/2011  . Essential hypertension 12/15/2006  . INSOMNIA 12/15/2006  . HEADACHE 12/15/2006  . CHEST PAIN 12/15/2006  . COLONIC POLYPS, HX OF 12/15/2006    Past Surgical History:  Procedure Laterality Date  . CARDIOVASCULAR STRESS TEST  07/26/2015   normal myocardial perfusion study w/ no ischemia/  normal LV function and wall motion , ef 71%  . COLONOSCOPY WITH PROPOFOL  last one 10/ 2017  . HYSTEROSCOPY W/D&C N/A 12/25/2015   Procedure: DILATATION AND CURETTAGE /HYSTEROSCOPY;  Surgeon: Bobbye Charleston, MD;  Location: [facility];  Service: Gynecology;  Laterality: N/A;  . IR IMAGING GUIDED PORT INSERTION  07/07/2017  . IR RADIOLOGIST EVAL & MGMT  05/06/2017  . IR RADIOLOGIST EVAL & MGMT  05/18/2017  . IR RADIOLOGIST EVAL &  MGMT  05/13/2017  . IR RADIOLOGIST EVAL & MGMT  06/03/2017  . IR RADIOLOGIST EVAL & MGMT  06/09/2017  . IR RADIOLOGIST EVAL & MGMT  06/24/2017  . LAPAROSCOPIC LYSIS OF ADHESIONS N/A 12/25/2015   Procedure: LAPAROSCOPIC LYSIS OF ADHESIONS;  Surgeon: Bobbye Charleston, MD;  Location: [place];  Service: Gynecology;  Laterality: N/A;  . LAPAROSCOPIC UNILATERAL SALPINGO OOPHERECTOMY Right 12/25/2015   Procedure: LAPAROSCOPIC UNILATERAL SALPINGO OOPHORECTOMY;  Surgeon:  Bobbye Charleston, MD;  Location: [facility];  Service: Gynecology;  Laterality: Right;  . LAPAROSCOPY N/A 12/25/2015   Procedure: LAPAROSCOPY OPERATIVE;  Surgeon: Bobbye Charleston, MD;  Location: [facility];  Service: Gynecology;  Laterality: N/A;  . LYMPH NODE BIOPSY N/A 04/15/2017   Procedure: SENTINEL LYMPH NODE BIOPSY, PERIAORTIC LYMPH NODES, AND VAGINAL BIOPSY;  Surgeon: Everitt Amber, MD;  Location: [facility];  Service: Gynecology;  Laterality: N/A;  . ROBOTIC ASSISTED TOTAL HYSTERECTOMY WITH BILATERAL SALPINGO OOPHERECTOMY Bilateral 04/15/2017   Procedure: XI ROBOTIC ASSISTED TOTAL LAPROSCOPIC  HYSTERECTOMY WITH LEFT SALPINGO OOPHORECTOMY;  Surgeon: Everitt Amber, MD;  Location: [facility];  Service: Gynecology;  Laterality: Bilateral;  . TONSILLECTOMY  age 56  . TRANSTHORACIC ECHOCARDIOGRAM  07/26/2015   grade 1 diastolic dysfunction , ef 60-65%/  trivial TR     OB History   None      Home Medications    Prior to Admission medications   Medication Sig Start Date End Date Taking? Authorizing Provider  ALPRAZolam (XANAX) 1 MG tablet TAKE 1/2 TO 1 TABLET BY MOUTH AT BEDTIME AS NEEDED FOR ANXIETY Patient taking differently: TAKE 1/2 TO 1 TABLET (0.5 - 1 MG)  BY MOUTH AT BEDTIME AS NEEDED FOR ANXIETY 05/05/16  Yes Laurey Morale, MD  enoxaparin (LOVENOX) 100 MG/ML injection Inject 0.85 mLs (85 mg total) into the skin every 12 (twelve) hours. 09/02/17  Yes Mikhail, Velta Addison, DO  fentaNYL (DURAGESIC - DOSED MCG/HR) 25 MCG/HR patch Place 1 patch (25 mcg total) onto the skin every 3 (three) days. 09/04/17  Yes Mikhail, Clinical biochemist, DO  glucagon (GLUCAGON EMERGENCY) 1 MG injection Inject 1 mg into the muscle once as needed (low blood sugar).   Yes [provider]  DULoxetine (CYMBALTA) 20 MG capsule Take 1 capsule (20 mg total) by mouth 2 (two) times daily. Once daily for 2 weeks then increase to twice daily Patient not taking: Reported on 09/02/2017 08/19/17   Sandi Mealy  E., PA-C  Insulin Human (INSULIN PUMP) SOLN Inject into the skin continuous. Use with Novolog    [provider]  lidocaine-prilocaine (EMLA) cream Apply to affected area once Patient not taking: Reported on 09/02/2017 07/07/17   Heath Lark, MD  ondansetron (ZOFRAN) 8 MG tablet Take 1 tablet (8 mg total) by mouth every 8 (eight) hours as needed for refractory nausea / vomiting. Start on day 3 after chemo. Patient not taking: Reported on 09/02/2017 07/07/17   Heath Lark, MD  prochlorperazine (COMPAZINE) 10 MG tablet TAKE 1 TABLET BY MOUTH EVERY 6 HOURS AS NEEDED FOR NAUSEA OR VOMITING Patient not taking: Reported on 09/02/2017 07/07/17   Heath Lark, MD  promethazine (PHENERGAN) 25 MG tablet Take 1 tablet (25 mg total) by mouth every 6 (six) hours as needed for nausea or vomiting. Patient not taking: Reported on 09/02/2017 08/25/17   Heath Lark, MD  senna (SENOKOT) 8.6 MG TABS tablet Take 1 tablet (8.6 mg total) by mouth at bedtime. Patient not taking: Reported on 09/02/2017 07/12/17  Heath Lark, MD  SUMAtriptan (IMITREX) 100 MG tablet TAKE 1 TABLET BY MOUTH AS NEEDED FOR MIGRAINES. MAY REPEAT IN 2 HOURS IF NEEDED Patient not taking: Reported on 09/02/2017 02/05/17   Laurey Morale, MD  traMADol (ULTRAM) 50 MG tablet TAKE 2 TABLETS BY MOUTH EVERY 4 HOURS AS NEEDED Patient not taking: Reported on 09/02/2017 05/06/17   Laurey Morale, MD    Family History Family History  Problem Relation Age of Onset  . Alcohol abuse Father        Died age 23  . Heart disease Father   . Aneurysm Mother        Died age 30  . Leukemia Brother        Died age 1  . COPD Sister   . Rheum arthritis Sister   . Heart failure Sister   . Dementia Maternal Uncle   . Colon cancer Neg Hx     Social History Social History   Tobacco Use  . Smoking status: Never Smoker  . Smokeless tobacco: Never Used  Substance Use Topics  . Alcohol use: No    Alcohol/week: 0.0 standard drinks  . Drug use: No     Allergies     Armodafinil; Invokana [canagliflozin]; Lasix [furosemide]; Statins; and Adhesive [tape]   Review of Systems Review of Systems  Unable to perform ROS: Mental status change     Physical Exam Updated Vital Signs BP 132/88 (BP Location: Right Arm)   Pulse (!) 128   Temp 98 F (36.7 C) (Oral)   Resp (!) 22   Ht 5\' 4"  (1.626 m)   Wt 92.9 kg   SpO2 100%   BMI 35.16 kg/m   Physical Exam  Constitutional: She appears well-developed and well-nourished. She appears distressed.  Sitting on the edge of the bed, hyperventilating, uncomfortable, not able to give any reasonable history  HENT:  Head: Normocephalic and atraumatic.  Mouth/Throat: Oropharynx is clear and moist. No oropharyngeal exudate.  Dry mucus membranes  Eyes: Pupils are equal, round, and reactive to light. Conjunctivae and EOM are normal.  Neck: Normal range of motion. Neck supple.  No meningismus.  Cardiovascular: Normal rate, regular rhythm, normal heart sounds and intact distal pulses.  No murmur heard. Pulmonary/Chest: Effort normal and breath sounds normal. No respiratory distress. She exhibits no tenderness.  Abdominal: Soft. There is no tenderness. There is no rebound and no guarding.  Musculoskeletal: Normal range of motion. She exhibits no edema or tenderness.  Neurological: She is alert. No cranial nerve deficit. She exhibits normal muscle tone. Coordination normal.  Patient sitting on the edge of the bed, answers a few questions, will not cooperate for formal neurological testing.  Skin: Skin is warm.  Psychiatric: She has a normal mood and affect. Her behavior is normal.  Nursing note and vitals reviewed.    ED Treatments / Results  Labs (all labs ordered are listed, but only abnormal results are displayed) Labs Reviewed  CBC WITH DIFFERENTIAL/PLATELET - Abnormal; Notable for the following components:      Result Value   WBC 12.1 (*)    RBC 3.10 (*)    Hemoglobin 9.2 (*)    HCT 30.2 (*)    RDW  21.9 (*)    Platelets 426 (*)    Monocytes Absolute 1.2 (*)    All other components within normal limits  COMPREHENSIVE METABOLIC PANEL - Abnormal; Notable for the following components:   CO2 <7 (*)    Glucose, Bld 524 (*)  BUN 7 (*)    Creatinine, Ser 1.47 (*)    Albumin 2.9 (*)    AST 11 (*)    Total Bilirubin 1.8 (*)    GFR calc non Af Amer 35 (*)    GFR calc Af Amer 41 (*)    All other components within normal limits  BLOOD GAS, VENOUS - Abnormal; Notable for the following components:   pH, Ven 7.009 (*)    pO2, Ven 72.3 (*)    All other components within normal limits  BASIC METABOLIC PANEL - Abnormal; Notable for the following components:   Chloride 115 (*)    CO2 <7 (*)    Glucose, Bld 411 (*)    Creatinine, Ser 1.27 (*)    Calcium 8.0 (*)    GFR calc non Af Amer 42 (*)    GFR calc Af Amer 49 (*)    All other components within normal limits  BASIC METABOLIC PANEL - Abnormal; Notable for the following components:   Chloride 115 (*)    CO2 14 (*)    Glucose, Bld 219 (*)    BUN 7 (*)    Creatinine, Ser 1.29 (*)    Calcium 8.4 (*)    GFR calc non Af Amer 42 (*)    GFR calc Af Amer 48 (*)    All other components within normal limits  LACTIC ACID, PLASMA - Abnormal; Notable for the following components:   Lactic Acid, Venous 2.1 (*)    All other components within normal limits  LACTIC ACID, PLASMA - Abnormal; Notable for the following components:   Lactic Acid, Venous 2.1 (*)    All other components within normal limits  BLOOD GAS, ARTERIAL - Abnormal; Notable for the following components:   pH, Arterial 7.053 (*)    pCO2 arterial 17.8 (*)    pO2, Arterial 118 (*)    Bicarbonate 4.9 (*)    Acid-base deficit 24.4 (*)    All other components within normal limits  GLUCOSE, CAPILLARY - Abnormal; Notable for the following components:   Glucose-Capillary 296 (*)    All other components within normal limits  BASIC METABOLIC PANEL - Abnormal; Notable for the following  components:   Chloride 117 (*)    CO2 9 (*)    Glucose, Bld 322 (*)    BUN 7 (*)    Creatinine, Ser 1.37 (*)    Calcium 8.5 (*)    GFR calc non Af Amer 39 (*)    GFR calc Af Amer 45 (*)    Anion gap 18 (*)    All other components within normal limits  GLUCOSE, CAPILLARY - Abnormal; Notable for the following components:   Glucose-Capillary 237 (*)    All other components within normal limits  GLUCOSE, CAPILLARY - Abnormal; Notable for the following components:   Glucose-Capillary 201 (*)    All other components within normal limits  CBC - Abnormal; Notable for the following components:   RBC 2.47 (*)    Hemoglobin 7.4 (*)    HCT 22.8 (*)    RDW 21.8 (*)    All other components within normal limits  GLUCOSE, CAPILLARY - Abnormal; Notable for the following components:   Glucose-Capillary 157 (*)    All other components within normal limits  CBG MONITORING, ED - Abnormal; Notable for the following components:   Glucose-Capillary 443 (*)    All other components within normal limits  I-STAT CG4 LACTIC ACID, ED - Abnormal; Notable for the  following components:   Lactic Acid, Venous 3.42 (*)    All other components within normal limits  CBG MONITORING, ED - Abnormal; Notable for the following components:   Glucose-Capillary 447 (*)    All other components within normal limits  CBG MONITORING, ED - Abnormal; Notable for the following components:   Glucose-Capillary 381 (*)    All other components within normal limits  MRSA PCR SCREENING  LIPASE, BLOOD  BASIC METABOLIC PANEL  BASIC METABOLIC PANEL  BASIC METABOLIC PANEL    EKG None  Radiology Ct Head Wo Contrast  Result Date: 09/03/2017 CLINICAL DATA:  Encephalopathy.  History of stomach cancer. EXAM: CT HEAD WITHOUT CONTRAST TECHNIQUE: Contiguous axial images were obtained from the base of the skull through the vertex without intravenous contrast. COMPARISON:  None. FINDINGS: Brain: No intra-axial mass, edema or midline  shift. No large vascular territory infarct. Mild involutional changes of the brain with minimal small vessel ischemic disease. Midline fourth ventricle and basal cisterns without effacement. Vascular: No hyperdense vessel sign or unexpected calcifications. Skull: No aggressive osseous lesions.  Fracture. Sinuses/Orbits: No acute finding. Other: None. IMPRESSION: Minimal small vessel ischemic disease a periventricular white matter. No acute intracranial abnormality. Electronically Signed   By: Ashley Royalty M.D.   On: 09/03/2017 01:28   Dg Abdomen Acute W/chest  Result Date: 09/03/2017 CLINICAL DATA:  Gastric cancer.  Abdominal pain. EXAM: DG ABDOMEN ACUTE W/ 1V CHEST COMPARISON:  CT abdomen pelvis 08/31/2017 FINDINGS: There is a right chest wall power-injectable Port-A-Cath with tip in the lower SVC via a right internal jugular vein approach. There is no evidence of dilated bowel loops or free intraperitoneal air. No radiopaque calculi or other significant radiographic abnormality is seen. Heart size and mediastinal contours are within normal limits. Both lungs are clear. IMPRESSION: Negative abdominal radiographs.  No acute cardiopulmonary disease. Electronically Signed   By: Ulyses Jarred M.D.   On: 09/03/2017 00:47    Procedures Procedures (including critical care time)  Medications Ordered in ED Medications  sodium chloride 0.9 % bolus 1,000 mL (has no administration in time range)  HYDROmorphone (DILAUDID) injection 1 mg (has no administration in time range)  ondansetron (ZOFRAN) injection 4 mg (has no administration in time range)     Initial Impression / Assessment and Plan / ED Course  I have reviewed the triage vital signs and the nursing notes.  Pertinent labs & imaging results that were available during my care of the patient were reviewed by me and considered in my medical decision making (see chart for details).    Patient here with family.  She is DNR but not yet in hospice because  she is receiving IV fluids at home but no nutrition.  Family concerned about uncontrolled pain.  She was discharged from the hospital earlier today. Patient appears confused and is hyperventilating.  Family is agreeable to labs and IV fluids and pain control.  They declined CT head and states she has not hit her head. Patient ripped off her insulin pump at home.   Patient found to have severe DKA with pH of 7.0, bicarb undetectable.  She is started on IV fluids and IV insulin.  She is more comfortable after receiving Dilaudid.  She remains tachypneic and tachycardic.  Discussed with patient's son at bedside.  He confirms that she is DNR/DNI and just wants to keep her comfortable.  Declined CT head as he knows she is not a surgical candidate.  Admission discussed with Dr. Lorin Mercy.  Patient's family agreeable to IV fluids and IV insulin.  No apparent indication for antibiotics at this time. Continue lovenox for IVC thrombus.  Family not ready for comfort care yet. They are agreeable with IVF and IV insulin and are anxious to talk to Dr. Alvy Bimler in the morning.  CRITICAL CARE Performed by: Ezequiel Essex Total critical care time: 45 minutes Critical care time was exclusive of separately billable procedures and treating other patients. Critical care was necessary to treat or prevent imminent or life-threatening deterioration. Critical care was time spent personally by me on the following activities: development of treatment plan with patient and/or surrogate as well as nursing, discussions with consultants, evaluation of patient's response to treatment, examination of patient, obtaining history from patient or surrogate, ordering and performing treatments and interventions, ordering and review of laboratory studies, ordering and review of radiographic studies, pulse oximetry and re-evaluation of patient's condition.  Final Clinical Impressions(s) / ED Diagnoses   Final diagnoses:  Diabetic  ketoacidosis with coma associated with type 1 diabetes mellitus [facility])    ED Discharge Orders    None       Ezequiel Essex, MD 09/03/17 [phone]

## 2017-09-02 NOTE — Care Management Note (Signed)
Case Management Note  Patient Details  Name: Katie Palmer MRN: [id number] Date of Birth: [birth year]  Subjective/Objective:  Ordered for [facility] already following. [name] aware. Per attending if iv hydration needed [facility] can get those orders from Oncologist. No further CM needs.                  Action/Plan:d/c home w/HHC.   Expected Discharge Date:  09/02/17               Expected Discharge Plan:  [place]  In-House Referral:     Discharge planning Services  CM Consult  Post Acute Care Choice:  Home Health(Active w/[facility] HHRN-IV hydration.) Choice offered to:     DME Arranged:    DME Agency:     HH Arranged:    HH Agency:     Status of Service:  In process, will continue to follow  If discussed at Long Length of Stay Meetings, dates discussed:    Additional Comments:  Dessa Phi, RN 09/02/2017, 12:19 PM

## 2017-09-02 NOTE — Discharge Summary (Signed)
Physician Discharge Summary  Katie Palmer VXB:[id number] DOB: 09-Mar-1950 DOA: 08/29/2017  PCP: Laurey Morale, MD  Admit date: 08/29/2017 Discharge date: 09/02/2017  Time spent: 45 minutes  Recommendations for Outpatient Follow-up:  Patient will be discharged to home with home health services [facility].  Patient will need to follow up with primary care provider within one week of discharge.  Follow up with Dr. Alvy Bimler, oncology, as needed. Patient should continue medications as prescribed.  Patient should follow a regular diet.   Discharge Diagnoses:  Dehydration secondary to intractable nausea and vomiting Endometrial cancer Large IVF thrombus Bilateral hydronephrosis secondary to ureteral obstruction from tumor recurrence Diabetes mellitus type 1, with neuropathy Essential hypertension Depression/anxiety Normocytic anemia/Anemia of chronic disease Severe protein calorie malnutrition Cancer related pain Goals of care  Discharge Condition: Stable  Diet recommendation: Regular  Filed Weights   09/01/17 1825  Weight: 92.9 kg    History of present illness:  On 08/29/2017 by Dr. Alvera Singh a 67 y.o.femalewith medical history significant ofendometrial cancer, most recent chemotherapy 3 weeks ago with side effects including uncontrolled nausea, vomiting, dehydration. Chemotherapy has been on hold. Also with past medical history of diabetes, anxiety and depression, essential hypertension. She states that she has had some nausea and vomiting since her surgery back in April. This has been getting worse in the past few weeks after chemotherapy treatment. She states that she vomits intermittently, twice yesterday and once this morning. She denies any abdominal pain, diarrhea. No fevers at home. Denies any chest pain or shortness of breath or cough. She states that she has not eaten anything in days.  Hospital Course:    Dehydration secondary to intractable nausea and vomiting -Was placed on antiemetics and supportive care, IVF and patient has improved -Oncology recommended CT abd/pelvis to evaluate cancer progression as cause of N/V 9see discussion below)  Endometrial cancer -Oncology, Dr. Alvy Bimler, consulted and appreciated -Chemotherapy and radiation currently held -CT abdomen pelvis: Of disease with worsening necrotic nodal metastasis in the retroperitoneum and along the pelvic sidewall bilaterally (L>R), soft tissue metastasis in the low right psoas muscle, intraperitoneal metastatic lesion in the left lower quadrant.  Retroperitoneal lymphadenopathy associated with obstruction of the ureters bilaterally resulting in moderate to severe bilateral hydroureteronephrosis. -Palliative care consulted and multiple conversations have been had by multiple physicians with patient and family -Discussed with Dr. Alvy Bimler, plan is [facility]- she can receive IVF and antiemetics through them as well as have palliative services follow her  Large IVF thrombus -noted on CT scan: Very large nonocclusive thrombus in the infrahepatic IVC -Suspect secondary to cancer recurrence -Continue Lovenox- patient understands the risks of bleeding- this was also discussed with Dr. Alvy Bimler  Bilateral hydronephrosis secondary to ureteral obstruction from tumor recurrence -Creatinine currently 0.96 and stable  -Per conversation with Dr. Alvy Bimler, patient refused bilateral nephrostomy tube placement  Diabetes mellitus type 1, with neuropathy -Patient on insulin pump -Continue Neurontin for neuropathy  Essential hypertension -Hyzaar held in setting of dehydration -BP currently stable with no medications  Depression/anxiety -Continue Xanax, Cymbalta  Normocytic anemia/Anemia of chronic disease -hemoglobin currently 8.4, continue to monitor CBC  Severe protein calorie  malnutrition -nutrition consulted, continue feeding supplements  Cancer related pain -oncology started fentanyl patch -discussed with Dr. Alvy Bimler, will discharge patient with prescription  Goals of care -palliative care consulted and appreciated- per documentation, long conversation was had with the patient and son at  bedside. Code status was changed to DNR by palliative care.  -DNR code status was also confirmed today by Dr. Alvy Bimler  Procedures: None  Consultations: Oncology Palliative care  Discharge Exam: Vitals:   09/01/17 2054 09/02/17 0550  BP: 135/75 132/80  Pulse: 84 86  Resp: 18 16  Temp: 98 F (36.7 C) 97.9 F (36.6 C)  SpO2: 98% 99%     General: Well developed, well nourished, NAD, appears stated age  17: NCAT, mucous membranes moist.  Neck: Supple  Cardiovascular: S1 S2 auscultated, RRR, no murmur  Respiratory: Clear to auscultation bilaterally with equal chest rise  Abdomen: Soft, nontender, nondistended, + bowel sounds  Extremities: warm dry without cyanosis clubbing or edema  Neuro: AAOx3, nonfocal  Psych: Appropriate mood and affect  Discharge Instructions Discharge Instructions    Discharge instructions   Complete by:  As directed    Patient will be discharged to home with home health services through [facility].  Patient will need to follow up with primary care provider within one week of discharge.  Follow up with Dr. Alvy Bimler, oncology, as needed. Patient should continue medications as prescribed.  Patient should follow a regular diet.     Allergies as of 09/02/2017      Reactions   Armodafinil Other (See Comments)   Flu-like symptoms   Invokana [canagliflozin] Other (See Comments)   "shut down pancreas"    Lasix [furosemide] Diarrhea   Severe diarrhea   Adhesive [tape] Itching, Rash   Pt has not tried paper tape      Medication List    STOP taking these medications   gabapentin 100 MG  capsule Commonly known as:  NEURONTIN   losartan-hydrochlorothiazide 50-12.5 MG tablet Commonly known as:  HYZAAR     TAKE these medications   ALPRAZolam 1 MG tablet Commonly known as:  XANAX TAKE 1/2 TO 1 TABLET BY MOUTH AT BEDTIME AS NEEDED FOR ANXIETY What changed:  See the new instructions.   DULoxetine 20 MG capsule Commonly known as:  CYMBALTA Take 1 capsule (20 mg total) by mouth 2 (two) times daily. Once daily for 2 weeks then increase to twice daily   enoxaparin 100 MG/ML injection Commonly known as:  LOVENOX Inject 0.85 mLs (85 mg total) into the skin every 12 (twelve) hours.   ezetimibe 10 MG tablet Commonly known as:  ZETIA Take 10 mg by mouth at bedtime.   fentaNYL 25 MCG/HR patch Commonly known as:  DURAGESIC - dosed mcg/hr Place 1 patch (25 mcg total) onto the skin every 3 (three) days. Start taking on:  09/04/2017   GLUCAGON EMERGENCY 1 MG injection Generic drug:  glucagon Inject 1 mg into the muscle once as needed (low blood sugar).   insulin pump Soln Inject into the skin continuous. Use with Novolog   lidocaine-prilocaine cream Commonly known as:  EMLA Apply to affected area once   ondansetron 8 MG tablet Commonly known as:  ZOFRAN Take 1 tablet (8 mg total) by mouth every 8 (eight) hours as needed for refractory nausea / vomiting. Start on day 3 after chemo.   prochlorperazine 10 MG tablet Commonly known as:  COMPAZINE TAKE 1 TABLET BY MOUTH EVERY 6 HOURS AS NEEDED FOR NAUSEA OR VOMITING   promethazine 25 MG tablet Commonly known as:  PHENERGAN Take 1 tablet (25 mg total) by mouth every 6 (six) hours as needed for nausea or vomiting.   senna 8.6 MG Tabs tablet Commonly known as:  SENOKOT Take 1 tablet (8.6 mg total) by mouth at bedtime.   SUMAtriptan 100 MG tablet Commonly known as:  IMITREX TAKE 1 TABLET BY MOUTH AS NEEDED FOR MIGRAINES. MAY REPEAT IN 2 HOURS IF NEEDED What changed:    how much to take  how to take this  when to  take this  reasons to take this  additional instructions   traMADol 50 MG tablet Commonly known as:  ULTRAM TAKE 2 TABLETS BY MOUTH EVERY 4 HOURS AS NEEDED What changed:  See the new instructions.   traZODone 100 MG tablet Commonly known as:  DESYREL Take 100 mg by mouth at bedtime.   TYLENOL PO Take 1 tablet by mouth every 6 (six) hours as needed (pain/headache).   acetaminophen 500 MG tablet Commonly known as:  TYLENOL Take 1,000 mg by mouth every 6 (six) hours as needed.      Allergies  Allergen Reactions  . Armodafinil Other (See Comments)    Flu-like symptoms  . Invokana [Canagliflozin] Other (See Comments)    "shut down pancreas"   . Lasix [Furosemide] Diarrhea    Severe diarrhea  . Adhesive [Tape] Itching and Rash    Pt has not tried paper tape   Follow-up Information    Laurey Morale, MD. Schedule an appointment as soon as possible for a visit in 1 week(s).   Specialty:  Family Medicine Why:  Hospital follow up Contact information: [place] [phone]            The results of significant diagnostics from this hospitalization (including imaging, microbiology, ancillary and laboratory) are listed below for reference.    Significant Diagnostic Studies: Ct Abdomen Pelvis W Contrast  Result Date: 08/31/2017 CLINICAL DATA:  67 year old female with history of endometrial carcinoma. Chemotherapy discontinued 1 month ago. Radiation therapy planned for the morning. Nausea and vomiting. 13 pound weight loss over the past 3 weeks. Decreased appetite. Evaluate for recurrent disease. EXAM: CT ABDOMEN AND PELVIS WITH CONTRAST TECHNIQUE: Multidetector CT imaging of the abdomen and pelvis was performed using the standard protocol following bolus administration of intravenous contrast. CONTRAST:  166mL OMNIPAQUE IOHEXOL 300 MG/ML SOLN, 63mL ISOVUE-300 IOPAMIDOL (ISOVUE-300) INJECTION 61% COMPARISON:  CT the abdomen and pelvis  06/24/2017. FINDINGS: Lower chest: Unremarkable. Hepatobiliary: Perfusion anomaly adjacent to the falciform ligament in the left lobe of the liver, similar to prior examinations (a benign finding). No suspicious cystic or solid hepatic lesions. Very mild intrahepatic biliary ductal dilatation, similar to the prior study. No common bile duct dilatation. Gallbladder is moderately distended. No gallstones. No surrounding pericholecystic fluid or inflammatory changes. Pancreas: No pancreatic mass. No pancreatic ductal dilatation. No pancreatic or peripancreatic fluid or inflammatory changes. Spleen: Unremarkable. Adrenals/Urinary Tract: Moderate to severe bilateral hydroureteronephrosis. On the right, this appears related to obstruction of the mid right ureter related to retroperitoneal nodal masses which are centrally low-attenuation (likely internally necrotic) and have increased in size, with the largest of these currently measuring 3.0 x 2.1 x 5.8 cm (axial image 56 of series 2 and coronal image 45 of series 4). On the left, the ureteral obstruction is in the distal third of the ureter related to masses along the pelvic sidewall (discussed below) which appear to exert mass effect upon the distal ureter. No suspicious renal lesions. Bilateral adrenal glands are normal in appearance. Urinary bladder is normal in appearance. Stomach/Bowel: Normal appearance of the stomach. No pathologic dilatation of small bowel or colon. Normal appendix. Vascular/Lymphatic:  Aortic atherosclerosis. No aneurysm or dissection noted in the abdominal or pelvic vasculature. Large filling defect in the inferior vena cava measuring 1.7 x 2.1 x 1.1 cm (axial image 40 of series 2 and coronal image 46 of series 4), compatible with a nonocclusive thrombus. Increased number and size of centrally low-attenuation lesions in the retroperitoneum bilaterally and along the pelvic sidewalls (left greater than right), favored to reflect necrotic nodal  metastases. The largest of these is in the left retroperitoneum where it partially invades the left psoas muscle (axial image 56 of series 2 and coronal image 48 of series 4) measuring 2.5 x 4.9 x 3.6 cm. Several lesions are also noted along the left pelvic sidewall in the distribution of the left external iliac lymph nodes measuring 2.1 x 2.4 cm (axial image 76 of series 2) and 3.1 x 2.3 cm (axial image 71 of series 2). Reproductive: Status post hysterectomy. Ovaries are not confidently identified may be surgically absent or atrophic. Other: In addition to the presumed nodal metastases discussed above, there are other lesions, including a 1.8 x 2.6 cm lesion in the inferior aspect of the right psoas muscle (axial image 65 of series 2), and a lesion in the left lower quadrant (axial image 67 of series 2) measuring 1.9 x 2.3 cm, which likely reflects a peritoneal implant. Extensive thickening of the presacral soft tissues, new compared to the prior study, potentially treatment related if the patient has had radiation therapy, although this may simply be related to edema from lymphatic congestion. No significant volume of ascites. No pneumoperitoneum. Musculoskeletal: No aggressive appearing lytic or blastic lesions are noted in the visualized portions of the skeleton. IMPRESSION: 1. Findings are highly concerning for progression of disease, with worsening necrotic nodal metastasis in the retroperitoneum and along the pelvic sidewall bilaterally (left greater than right), soft tissue metastasis in the low right psoas muscle, and intraperitoneal metastatic lesion in the left lower quadrant. This retroperitoneal lymphadenopathy is now associated with obstruction of the ureters bilaterally resulting in moderate to severe bilateral hydroureteronephrosis. 2. 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 This places the patient at substantial risk for devastating pulmonary embolism. 3.  Additional incidental findings, similar to prior studies, as above. Critical Value/emergent results were called by telephone at the time of interpretation on 08/31/2017 at 8:22 pm to Dr. Earlie Server, who verbally acknowledged these results. Electronically Signed   By: Vinnie Langton M.D.   On: 08/31/2017 20:22   Dg Abdomen Acute W/chest  Result Date: 08/29/2017 CLINICAL DATA:  Nausea and vomiting for 3 weeks.  History of cancer. EXAM: DG ABDOMEN ACUTE W/ 1V CHEST COMPARISON:  Abdominal radiograph July 12, 2017 FINDINGS: Cardiomediastinal silhouette is normal. Single-lumen RIGHT chest Port-A-Cath distal tip projecting in proximal superior vena cava. Lungs are clear, no pleural effusions. No pneumothorax. Soft tissue planes and included osseous structures are non suspicious. Mildly elevated RIGHT hemidiaphragm. Bowel gas pattern is nondilated and nonobstructive. No significant retained large bowel stool volume. 15 mm linear metallic foreign body projecting at LEFT iliac crest on single view. Phleboliths project in the pelvis. No intra-abdominal mass effect, pathologic calcifications or free air. Soft tissue planes and included osseous structures are non-suspicious. IMPRESSION: 1. No acute cardiopulmonary process. 2. Normal bowel gas pattern. 3. Needle-like fragment projecting over LEFT iliac crest on single view, potentially external to patient. Electronically Signed   By: Elon Alas M.D.   On: 08/29/2017 16:43    Microbiology: Recent Results (from  the past 240 hour(s))  Culture, blood (routine x 2)     Status: None (Preliminary result)   Collection Time: 08/29/17  4:22 PM  Result Value Ref Range Status   Specimen Description   Final    BLOOD PORTA CATH Performed at [facility] [address], [place], [place] [address]    Special Requests   Final    BOTTLES DRAWN AEROBIC AND ANAEROBIC Blood Culture adequate volume Performed at [facility] [address], [place], [place] [address]    Culture   Final    NO GROWTH 3 DAYS Performed at [facility], [place] [address] [place], [place] [address]    Report Status PENDING  Incomplete  Culture, blood (routine x 2)     Status: None (Preliminary result)   Collection Time: 08/29/17  4:22 PM  Result Value Ref Range Status   Specimen Description   Final    BLOOD BLOOD LEFT ARM Performed at [facility] [address] [place], [place] [address]    Special Requests   Final    BOTTLES DRAWN AEROBIC AND ANAEROBIC Blood Culture results may not be optimal due to an excessive volume of blood received in culture bottles Performed at [facility] [address] [place], [place] [address]    Culture   Final    NO GROWTH 3 DAYS Performed at [facility], [place] [address] [place], [place] [address]    Report Status PENDING  Incomplete  Urine culture     Status: None   Collection Time: 08/30/17  7:13 AM  Result Value Ref Range Status   Specimen Description   Final    URINE, CATHETERIZED Performed at [facility] [address] [place], [place] [address]    Special Requests   Final    Normal Performed at [facility], [place] [address], [place], [place] [address]    Culture   Final    NO GROWTH Performed at [facility], [place] [address], [place], [place] [address]    Report Status 08/31/2017 FINAL  Final     Labs: Basic Metabolic Panel: Recent Labs  Lab 08/29/17 1618 08/29/17 1622 08/30/17 0540 08/31/17 0500 09/01/17 0555 09/02/17 0421  NA  --  138 143 140 138 138  K  --  2.7* 3.2* 3.5 4.6 3.8  CL  --  97* 105 103 105 107  CO2  --  28 29 24  14* 18*  GLUCOSE  --  159* 164* 189* 314* 258*  BUN  --  8 6* 5* 7* <5*  CREATININE  --  0.94 0.85 0.89 1.18* 0.96  CALCIUM  --  8.2* 8.0* 8.1* 8.5* 8.2*  MG 1.7  --   --   --  1.7  --    Liver Function Tests: Recent Labs  Lab 08/29/17 1622  AST 12*   ALT 8  ALKPHOS 74  BILITOT 1.0  PROT 6.0*  ALBUMIN 2.5*   Recent Labs  Lab 08/29/17 1622  LIPASE 18   No results for input(s): AMMONIA in the last 168 hours. CBC: Recent Labs  Lab 08/29/17 1622 08/30/17 0540 08/31/17 0500 09/01/17 0555 09/02/17 0421  WBC 4.9 4.4 5.2 7.1 5.3  NEUTROABS 3.8  --   --   --   --   HGB 9.0* 8.1* 7.9* 9.2* 8.4*  HCT 26.8* 24.5* 24.4* 28.5* 25.4*  MCV 89.0 90.4 91.4  93.1 90.7  PLT 274 226 233 308 268   Cardiac Enzymes: No results for input(s): CKTOTAL, CKMB, CKMBINDEX, TROPONINI in the last 168 hours. BNP: BNP (last 3 results) No results for input(s): BNP in the last 8760 hours.  ProBNP (last 3 results) No results for input(s): PROBNP in the last 8760 hours.  CBG: Recent Labs  Lab 09/01/17 0756 09/01/17 1151 09/01/17 1722 09/01/17 2125 09/02/17 0211  GLUCAP 281* 295* 259* 184* 201*       Signed:  Raedyn Wenke  [facility] 09/02/2017, 11:30 AM

## 2017-09-03 ENCOUNTER — Emergency Department (HOSPITAL_COMMUNITY): Payer: Medicare Other

## 2017-09-03 ENCOUNTER — Other Ambulatory Visit: Payer: Self-pay

## 2017-09-03 DIAGNOSIS — D49511 Neoplasm of unspecified behavior of right kidney: Secondary | ICD-10-CM | POA: Diagnosis present

## 2017-09-03 DIAGNOSIS — Z515 Encounter for palliative care: Secondary | ICD-10-CM

## 2017-09-03 DIAGNOSIS — C541 Malignant neoplasm of endometrium: Secondary | ICD-10-CM

## 2017-09-03 DIAGNOSIS — C679 Malignant neoplasm of bladder, unspecified: Secondary | ICD-10-CM | POA: Diagnosis present

## 2017-09-03 DIAGNOSIS — Z91128 Patient's intentional underdosing of medication regimen for other reason: Secondary | ICD-10-CM | POA: Diagnosis not present

## 2017-09-03 DIAGNOSIS — E1110 Type 2 diabetes mellitus with ketoacidosis without coma: Secondary | ICD-10-CM | POA: Diagnosis present

## 2017-09-03 DIAGNOSIS — Z7189 Other specified counseling: Secondary | ICD-10-CM

## 2017-09-03 DIAGNOSIS — Y92009 Unspecified place in unspecified non-institutional (private) residence as the place of occurrence of the external cause: Secondary | ICD-10-CM | POA: Diagnosis not present

## 2017-09-03 DIAGNOSIS — I82429 Acute embolism and thrombosis of unspecified iliac vein: Secondary | ICD-10-CM

## 2017-09-03 DIAGNOSIS — C786 Secondary malignant neoplasm of retroperitoneum and peritoneum: Secondary | ICD-10-CM | POA: Diagnosis present

## 2017-09-03 DIAGNOSIS — R4182 Altered mental status, unspecified: Secondary | ICD-10-CM

## 2017-09-03 DIAGNOSIS — G893 Neoplasm related pain (acute) (chronic): Secondary | ICD-10-CM

## 2017-09-03 DIAGNOSIS — E1139 Type 2 diabetes mellitus with other diabetic ophthalmic complication: Secondary | ICD-10-CM

## 2017-09-03 DIAGNOSIS — T383X6A Underdosing of insulin and oral hypoglycemic [antidiabetic] drugs, initial encounter: Secondary | ICD-10-CM | POA: Diagnosis present

## 2017-09-03 DIAGNOSIS — Z794 Long term (current) use of insulin: Secondary | ICD-10-CM

## 2017-09-03 DIAGNOSIS — R269 Unspecified abnormalities of gait and mobility: Secondary | ICD-10-CM | POA: Diagnosis present

## 2017-09-03 DIAGNOSIS — F419 Anxiety disorder, unspecified: Secondary | ICD-10-CM | POA: Diagnosis present

## 2017-09-03 DIAGNOSIS — E1022 Type 1 diabetes mellitus with diabetic chronic kidney disease: Secondary | ICD-10-CM | POA: Diagnosis present

## 2017-09-03 DIAGNOSIS — C800 Disseminated malignant neoplasm, unspecified: Secondary | ICD-10-CM | POA: Diagnosis not present

## 2017-09-03 DIAGNOSIS — Z79891 Long term (current) use of opiate analgesic: Secondary | ICD-10-CM | POA: Diagnosis not present

## 2017-09-03 DIAGNOSIS — Z9221 Personal history of antineoplastic chemotherapy: Secondary | ICD-10-CM | POA: Diagnosis not present

## 2017-09-03 DIAGNOSIS — Z66 Do not resuscitate: Secondary | ICD-10-CM

## 2017-09-03 DIAGNOSIS — E10649 Type 1 diabetes mellitus with hypoglycemia without coma: Secondary | ICD-10-CM | POA: Diagnosis not present

## 2017-09-03 DIAGNOSIS — E876 Hypokalemia: Secondary | ICD-10-CM | POA: Diagnosis present

## 2017-09-03 DIAGNOSIS — I129 Hypertensive chronic kidney disease with stage 1 through stage 4 chronic kidney disease, or unspecified chronic kidney disease: Secondary | ICD-10-CM | POA: Diagnosis present

## 2017-09-03 DIAGNOSIS — Z7902 Long term (current) use of antithrombotics/antiplatelets: Secondary | ICD-10-CM | POA: Diagnosis not present

## 2017-09-03 DIAGNOSIS — E0811 Diabetes mellitus due to underlying condition with ketoacidosis with coma: Secondary | ICD-10-CM | POA: Diagnosis not present

## 2017-09-03 DIAGNOSIS — I82220 Acute embolism and thrombosis of inferior vena cava: Secondary | ICD-10-CM | POA: Diagnosis present

## 2017-09-03 DIAGNOSIS — Z9641 Presence of insulin pump (external) (internal): Secondary | ICD-10-CM | POA: Diagnosis present

## 2017-09-03 DIAGNOSIS — E1011 Type 1 diabetes mellitus with ketoacidosis with coma: Secondary | ICD-10-CM | POA: Diagnosis present

## 2017-09-03 DIAGNOSIS — N1330 Unspecified hydronephrosis: Secondary | ICD-10-CM | POA: Diagnosis not present

## 2017-09-03 DIAGNOSIS — N131 Hydronephrosis with ureteral stricture, not elsewhere classified: Secondary | ICD-10-CM | POA: Diagnosis present

## 2017-09-03 DIAGNOSIS — N183 Chronic kidney disease, stage 3 (moderate): Secondary | ICD-10-CM | POA: Diagnosis present

## 2017-09-03 DIAGNOSIS — D630 Anemia in neoplastic disease: Secondary | ICD-10-CM

## 2017-09-03 DIAGNOSIS — E43 Unspecified severe protein-calorie malnutrition: Secondary | ICD-10-CM | POA: Diagnosis present

## 2017-09-03 DIAGNOSIS — D649 Anemia, unspecified: Secondary | ICD-10-CM | POA: Diagnosis present

## 2017-09-03 DIAGNOSIS — Z7951 Long term (current) use of inhaled steroids: Secondary | ICD-10-CM

## 2017-09-03 DIAGNOSIS — F329 Major depressive disorder, single episode, unspecified: Secondary | ICD-10-CM | POA: Diagnosis present

## 2017-09-03 LAB — CBC
HEMATOCRIT: 22.8 % — AB (ref 36.0–46.0)
HEMOGLOBIN: 7.4 g/dL — AB (ref 12.0–15.0)
MCH: 30 pg (ref 26.0–34.0)
MCHC: 32.5 g/dL (ref 30.0–36.0)
MCV: 92.3 fL (ref 78.0–100.0)
Platelets: 238 10*3/uL (ref 150–400)
RBC: 2.47 MIL/uL — AB (ref 3.87–5.11)
RDW: 21.8 % — ABNORMAL HIGH (ref 11.5–15.5)
WBC: 7.3 10*3/uL (ref 4.0–10.5)

## 2017-09-03 LAB — BLOOD GAS, VENOUS
DRAWN BY: 51517
O2 Saturation: 85.6 %
Patient temperature: 98.6
pH, Ven: 7.009 — CL (ref 7.250–7.430)
pO2, Ven: 72.3 mmHg — ABNORMAL HIGH (ref 32.0–45.0)

## 2017-09-03 LAB — COMPREHENSIVE METABOLIC PANEL
ALBUMIN: 2.9 g/dL — AB (ref 3.5–5.0)
ALK PHOS: 99 U/L (ref 38–126)
ALT: 8 U/L (ref 0–44)
AST: 11 U/L — AB (ref 15–41)
BILIRUBIN TOTAL: 1.8 mg/dL — AB (ref 0.3–1.2)
BUN: 7 mg/dL — AB (ref 8–23)
CALCIUM: 9.4 mg/dL (ref 8.9–10.3)
CO2: <7 mmol/L — ABNORMAL LOW (ref 22–32)
Chloride: 109 mmol/L (ref 98–111)
Creatinine, Ser: 1.47 mg/dL — ABNORMAL HIGH (ref 0.44–1.00)
GFR calc Af Amer: 41 mL/min — ABNORMAL LOW (ref >60)
GFR, EST NON AFRICAN AMERICAN: 35 mL/min — AB (ref >60)
GLUCOSE: 524 mg/dL — AB (ref 70–99)
POTASSIUM: 4.7 mmol/L (ref 3.5–5.1)
Sodium: 140 mmol/L (ref 135–145)
Total Protein: 6.8 g/dL (ref 6.5–8.1)

## 2017-09-03 LAB — BASIC METABOLIC PANEL
ANION GAP: 18 — AB (ref 5–15)
ANION GAP: 7 (ref 5–15)
ANION GAP: 9 (ref 5–15)
Anion gap: 14 (ref 5–15)
Anion gap: 5 (ref 5–15)
BUN: 8 mg/dL (ref 8–23)
BUN: 7 mg/dL — AB (ref 8–23)
BUN: 7 mg/dL — ABNORMAL LOW (ref 8–23)
BUN: 7 mg/dL — ABNORMAL LOW (ref 8–23)
BUN: 8 mg/dL (ref 8–23)
BUN: 8 mg/dL (ref 8–23)
CALCIUM: 8 mg/dL — AB (ref 8.9–10.3)
CALCIUM: 8.4 mg/dL — AB (ref 8.9–10.3)
CALCIUM: 8.2 mg/dL — AB (ref 8.9–10.3)
CALCIUM: 8.5 mg/dL — AB (ref 8.9–10.3)
CALCIUM: 8 mg/dL — AB (ref 8.9–10.3)
CHLORIDE: 115 mmol/L — AB (ref 98–111)
CHLORIDE: 117 mmol/L — AB (ref 98–111)
CHLORIDE: 115 mmol/L — AB (ref 98–111)
CO2: 9 mmol/L — ABNORMAL LOW (ref 22–32)
CO2: 14 mmol/L — AB (ref 22–32)
CO2: 17 mmol/L — AB (ref 22–32)
CO2: 20 mmol/L — AB (ref 22–32)
CO2: 16 mmol/L — ABNORMAL LOW (ref 22–32)
Calcium: 8.5 mg/dL — ABNORMAL LOW (ref 8.9–10.3)
Chloride: 117 mmol/L — ABNORMAL HIGH (ref 98–111)
Chloride: 117 mmol/L — ABNORMAL HIGH (ref 98–111)
Chloride: 114 mmol/L — ABNORMAL HIGH (ref 98–111)
Creatinine, Ser: 1.27 mg/dL — ABNORMAL HIGH (ref 0.44–1.00)
Creatinine, Ser: 1.37 mg/dL — ABNORMAL HIGH (ref 0.44–1.00)
Creatinine, Ser: 1.29 mg/dL — ABNORMAL HIGH (ref 0.44–1.00)
Creatinine, Ser: 1.09 mg/dL — ABNORMAL HIGH (ref 0.44–1.00)
Creatinine, Ser: 1.03 mg/dL — ABNORMAL HIGH (ref 0.44–1.00)
Creatinine, Ser: 1 mg/dL (ref 0.44–1.00)
GFR calc Af Amer: 45 mL/min — ABNORMAL LOW (ref >60)
GFR calc non Af Amer: 42 mL/min — ABNORMAL LOW (ref >60)
GFR calc non Af Amer: 39 mL/min — ABNORMAL LOW (ref >60)
GFR calc non Af Amer: 42 mL/min — ABNORMAL LOW (ref >60)
GFR calc non Af Amer: 51 mL/min — ABNORMAL LOW (ref >60)
GFR, EST AFRICAN AMERICAN: 49 mL/min — AB (ref >60)
GFR, EST AFRICAN AMERICAN: 48 mL/min — AB (ref >60)
GFR, EST AFRICAN AMERICAN: 59 mL/min — AB (ref >60)
GFR, EST NON AFRICAN AMERICAN: 55 mL/min — AB (ref >60)
GFR, EST NON AFRICAN AMERICAN: 57 mL/min — AB (ref >60)
Glucose, Bld: 411 mg/dL — ABNORMAL HIGH (ref 70–99)
Glucose, Bld: 322 mg/dL — ABNORMAL HIGH (ref 70–99)
Glucose, Bld: 219 mg/dL — ABNORMAL HIGH (ref 70–99)
Glucose, Bld: 200 mg/dL — ABNORMAL HIGH (ref 70–99)
Glucose, Bld: 144 mg/dL — ABNORMAL HIGH (ref 70–99)
Glucose, Bld: 296 mg/dL — ABNORMAL HIGH (ref 70–99)
POTASSIUM: 4.3 mmol/L (ref 3.5–5.1)
POTASSIUM: 4 mmol/L (ref 3.5–5.1)
POTASSIUM: 3.2 mmol/L — AB (ref 3.5–5.1)
POTASSIUM: 3.6 mmol/L (ref 3.5–5.1)
Potassium: 4 mmol/L (ref 3.5–5.1)
Potassium: 3.8 mmol/L (ref 3.5–5.1)
Sodium: 142 mmol/L (ref 135–145)
Sodium: 144 mmol/L (ref 135–145)
Sodium: 143 mmol/L (ref 135–145)
Sodium: 141 mmol/L (ref 135–145)
Sodium: 142 mmol/L (ref 135–145)
Sodium: 139 mmol/L (ref 135–145)

## 2017-09-03 LAB — GLUCOSE, CAPILLARY
GLUCOSE-CAPILLARY: 237 mg/dL — AB (ref 70–99)
GLUCOSE-CAPILLARY: 201 mg/dL — AB (ref 70–99)
GLUCOSE-CAPILLARY: 157 mg/dL — AB (ref 70–99)
GLUCOSE-CAPILLARY: 150 mg/dL — AB (ref 70–99)
GLUCOSE-CAPILLARY: 118 mg/dL — AB (ref 70–99)
GLUCOSE-CAPILLARY: 114 mg/dL — AB (ref 70–99)
GLUCOSE-CAPILLARY: 113 mg/dL — AB (ref 70–99)
GLUCOSE-CAPILLARY: 130 mg/dL — AB (ref 70–99)
GLUCOSE-CAPILLARY: 145 mg/dL — AB (ref 70–99)
GLUCOSE-CAPILLARY: 238 mg/dL — AB (ref 70–99)
Glucose-Capillary: 296 mg/dL — ABNORMAL HIGH (ref 70–99)
Glucose-Capillary: 154 mg/dL — ABNORMAL HIGH (ref 70–99)
Glucose-Capillary: 166 mg/dL — ABNORMAL HIGH (ref 70–99)
Glucose-Capillary: 184 mg/dL — ABNORMAL HIGH (ref 70–99)
Glucose-Capillary: 205 mg/dL — ABNORMAL HIGH (ref 70–99)
Glucose-Capillary: 131 mg/dL — ABNORMAL HIGH (ref 70–99)
Glucose-Capillary: 139 mg/dL — ABNORMAL HIGH (ref 70–99)

## 2017-09-03 LAB — CBC WITH DIFFERENTIAL/PLATELET
BASOS ABS: 0 10*3/uL (ref 0.0–0.1)
Basophils Relative: 0 %
EOS PCT: 0 %
Eosinophils Absolute: 0 10*3/uL (ref 0.0–0.7)
HCT: 30.2 % — ABNORMAL LOW (ref 36.0–46.0)
HEMOGLOBIN: 9.2 g/dL — AB (ref 12.0–15.0)
LYMPHS ABS: 1.9 10*3/uL (ref 0.7–4.0)
LYMPHS PCT: 16 %
MCH: 29.7 pg (ref 26.0–34.0)
MCHC: 30.5 g/dL (ref 30.0–36.0)
MCV: 97.4 fL (ref 78.0–100.0)
MONO ABS: 1.2 10*3/uL — AB (ref 0.1–1.0)
Monocytes Relative: 10 %
NEUTROS ABS: 8.9 10*3/uL (ref 1.7–7.7)
Neutrophils Relative %: 74 %
Platelets: 426 10*3/uL — ABNORMAL HIGH (ref 150–400)
RBC: 3.1 MIL/uL — ABNORMAL LOW (ref 3.87–5.11)
RDW: 21.9 % — AB (ref 11.5–15.5)
WBC: 12.1 10*3/uL — ABNORMAL HIGH (ref 4.0–10.5)

## 2017-09-03 LAB — CULTURE, BLOOD (ROUTINE X 2)
CULTURE: NO GROWTH
Culture: NO GROWTH
Special Requests: ADEQUATE

## 2017-09-03 LAB — CBG MONITORING, ED
Glucose-Capillary: 447 mg/dL — ABNORMAL HIGH (ref 70–99)
Glucose-Capillary: 381 mg/dL — ABNORMAL HIGH (ref 70–99)

## 2017-09-03 LAB — MRSA PCR SCREENING: MRSA BY PCR: NEGATIVE

## 2017-09-03 LAB — BLOOD GAS, ARTERIAL
Acid-base deficit: 24.4 mmol/L — ABNORMAL HIGH (ref 0.0–2.0)
Bicarbonate: 4.9 mmol/L — ABNORMAL LOW (ref 20.0–28.0)
Drawn by: 422461
FIO2: 21
O2 Saturation: 97.4 %
PCO2 ART: 17.8 mmHg — AB (ref 32.0–48.0)
PO2 ART: 118 mmHg — AB (ref 83.0–108.0)
Patient temperature: 95.4
pH, Arterial: 7.053 — CL (ref 7.350–7.450)

## 2017-09-03 LAB — LIPASE, BLOOD: LIPASE: 17 U/L (ref 11–51)

## 2017-09-03 LAB — LACTIC ACID, PLASMA
Lactic Acid, Venous: 2.1 mmol/L (ref 0.5–1.9)
Lactic Acid, Venous: 2.1 mmol/L (ref 0.5–1.9)

## 2017-09-03 MED ORDER — LORAZEPAM 1 MG PO TABS: 1.0000 mg | ORAL_TABLET | ORAL | Status: DC | PRN

## 2017-09-03 MED ORDER — SODIUM CHLORIDE 0.9 % IV SOLN
INTRAVENOUS | Status: AC
  Administered 2017-09-03: 04:00:00 via INTRAVENOUS

## 2017-09-03 MED ORDER — OXYBUTYNIN CHLORIDE 5 MG/5ML PO SYRP
5.0000 mg | ORAL_SOLUTION | Freq: Two times a day (BID) | ORAL | Status: DC
  Administered 2017-09-03 – 2017-09-06 (×6): 5 mg via ORAL
  Filled 2017-09-03 (×9): qty 5

## 2017-09-03 MED ORDER — LORAZEPAM 2 MG/ML IJ SOLN
1.0000 mg | INTRAMUSCULAR | Status: DC | PRN
  Administered 2017-09-03 – 2017-09-06 (×6): 1 mg via INTRAVENOUS
  Filled 2017-09-03 (×6): qty 1

## 2017-09-03 MED ORDER — SODIUM CHLORIDE 0.9 % IV BOLUS: 1000.0000 mL | Freq: Once | INTRAVENOUS | Status: AC

## 2017-09-03 MED ORDER — FENTANYL CITRATE (PF) 100 MCG/2ML IJ SOLN
25.0000 ug | INTRAMUSCULAR | Status: DC | PRN
  Administered 2017-09-03 – 2017-09-06 (×30): 25 ug via INTRAVENOUS
  Filled 2017-09-03 (×30): qty 2

## 2017-09-03 MED ORDER — ORAL CARE MOUTH RINSE
15.0000 mL | Freq: Two times a day (BID) | OROMUCOSAL | Status: DC
  Administered 2017-09-04 – 2017-09-05 (×3): 15 mL via OROMUCOSAL

## 2017-09-03 MED ORDER — POTASSIUM CHLORIDE 10 MEQ/100ML IV SOLN
10.0000 meq | INTRAVENOUS | Status: AC
  Administered 2017-09-03 (×4): 10 meq via INTRAVENOUS
  Filled 2017-09-03 (×5): qty 100

## 2017-09-03 MED ORDER — SODIUM CHLORIDE 0.9 % IV SOLN
INTRAVENOUS | Status: DC
  Administered 2017-09-03: 7.7 [IU]/h via INTRAVENOUS
  Administered 2017-09-03: 3.6 [IU]/h via INTRAVENOUS
  Filled 2017-09-03: qty 1

## 2017-09-03 MED ORDER — ENOXAPARIN SODIUM 100 MG/ML ~~LOC~~ SOLN
1.0000 mg/kg | Freq: Two times a day (BID) | SUBCUTANEOUS | Status: DC
  Administered 2017-09-03 – 2017-09-06 (×7): 95 mg via SUBCUTANEOUS
  Filled 2017-09-03 (×8): qty 0.95

## 2017-09-03 MED ORDER — INSULIN ASPART 100 UNIT/ML ~~LOC~~ SOLN: 0.0000 [IU] | Freq: Three times a day (TID) | SUBCUTANEOUS | Status: DC

## 2017-09-03 MED ORDER — MAGNESIUM SULFATE 2 GM/50ML IV SOLN
2.0000 g | Freq: Once | INTRAVENOUS | Status: AC
  Administered 2017-09-03: 2 g via INTRAVENOUS
  Filled 2017-09-03: qty 50

## 2017-09-03 MED ORDER — LORAZEPAM 2 MG/ML PO CONC: 1.0000 mg | ORAL | Status: DC | PRN

## 2017-09-03 MED ORDER — SODIUM CHLORIDE 0.9 % IV BOLUS
500.0000 mL | Freq: Once | INTRAVENOUS | Status: AC
  Administered 2017-09-03: 500 mL via INTRAVENOUS

## 2017-09-03 MED ORDER — INSULIN ASPART 100 UNIT/ML ~~LOC~~ SOLN: 3.0000 [IU] | Freq: Three times a day (TID) | SUBCUTANEOUS | Status: DC

## 2017-09-03 MED ORDER — SODIUM CHLORIDE 0.9 % IV SOLN
INTRAVENOUS | Status: DC
  Administered 2017-09-03 – 2017-09-04 (×2): via INTRAVENOUS

## 2017-09-03 MED ORDER — FENTANYL 25 MCG/HR TD PT72
25.0000 ug | MEDICATED_PATCH | TRANSDERMAL | Status: DC
  Administered 2017-09-03 – 2017-09-06 (×2): 25 ug via TRANSDERMAL
  Filled 2017-09-03 (×2): qty 1

## 2017-09-03 MED ORDER — ONDANSETRON HCL 4 MG/2ML IJ SOLN
4.0000 mg | Freq: Four times a day (QID) | INTRAMUSCULAR | Status: DC | PRN
  Administered 2017-09-06 (×2): 4 mg via INTRAVENOUS
  Filled 2017-09-03 (×2): qty 2

## 2017-09-03 MED ORDER — SODIUM CHLORIDE 0.9 % IV SOLN
INTRAVENOUS | Status: DC
Start: 2017-09-03 — End: 2017-09-06
  Administered 2017-09-03 – 2017-09-05 (×4): via INTRAVENOUS

## 2017-09-03 MED ORDER — INSULIN GLARGINE 100 UNIT/ML ~~LOC~~ SOLN
10.0000 [IU] | Freq: Every day | SUBCUTANEOUS | Status: DC
  Administered 2017-09-03: 10 [IU] via SUBCUTANEOUS
  Filled 2017-09-03: qty 0.1

## 2017-09-03 MED ORDER — POTASSIUM CHLORIDE 10 MEQ/100ML IV SOLN
10.0000 meq | INTRAVENOUS | Status: AC
  Administered 2017-09-03 (×2): 10 meq via INTRAVENOUS
  Filled 2017-09-03 (×2): qty 100

## 2017-09-03 MED ORDER — SODIUM CHLORIDE 4 MEQ/ML IV SOLN
INTRAVENOUS | Status: DC
  Filled 2017-09-03: qty 9.71

## 2017-09-03 MED ORDER — BOOST / RESOURCE BREEZE PO LIQD CUSTOM
1.0000 | Freq: Three times a day (TID) | ORAL | Status: DC
  Administered 2017-09-03 – 2017-09-05 (×2): 1 via ORAL

## 2017-09-03 MED ORDER — ADULT MULTIVITAMIN W/MINERALS CH: 1.0000 | ORAL_TABLET | Freq: Every day | ORAL | Status: DC

## 2017-09-03 MED ORDER — DEXTROSE-NACL 5-0.45 % IV SOLN
INTRAVENOUS | Status: DC
  Administered 2017-09-03: 05:00:00 via INTRAVENOUS

## 2017-09-03 NOTE — Progress Notes (Signed)
   09/03/17 1400  Clinical Encounter Type  Visited With Patient  Visit Type Initial;Psychological support;Spiritual support  Referral From Patient  Consult/Referral To Chaplain  Spiritual Encounters  Spiritual Needs Emotional;Other (Comment) (Spiritual Care Conversation/Support)  Stress Factors  Patient Stress Factors Health changes;Other (Comment) (Pain and Discomfort)   Briefly visited with the patient who requested help readjusting in the bed. I helped her to get more comfortable with the aid of a nurse.  The patient did not state any other needs.   Please, contact Spiritual Care for further assistance.   Chaplain Shanon Ace M.Div., [facility]

## 2017-09-03 NOTE — ED Notes (Signed)
ED TO INPATIENT HANDOFF REPORT  Name/Age/Gender Katie Palmer 67 y.o. female  Code Status    Code Status Orders  (From admission, onward)         Start     Ordered   09/03/17 0126  Do not attempt resuscitation (DNR)  Continuous    Question Answer Comment  In the event of cardiac or respiratory ARREST Do not call a "code blue"   In the event of cardiac or respiratory ARREST Do not perform Intubation, CPR, defibrillation or ACLS   In the event of cardiac or respiratory ARREST Use medication by any route, position, wound care, and other measures to relive pain and suffering. May use oxygen, suction and manual treatment of airway obstruction as needed for comfort.      09/03/17 0126        Code Status History    Date Active Date Inactive Code Status Order ID Comments User Context   09/01/2017 1836 09/02/2017 1748 DNR [id number]  Jimmy Footman, NP Inpatient   08/29/2017 1925 09/01/2017 1835 Full Code [id number]  Dessa Phi, DO Inpatient   04/29/2017 1634 05/02/2017 1627 Full Code [id number]  Dorothyann Gibbs, NP Inpatient   04/15/2017 1249 04/16/2017 1740 Full Code [id number]  Everitt Amber, MD Inpatient   05/20/2014 1438 05/22/2014 1319 Full Code [id number]  Rama, Venetia Maxon, MD Inpatient   04/12/2014 1301 04/14/2014 1513 Full Code [id number]  Thurnell Lose, MD ED   07/28/2013 1746 07/30/2013 1748 Full Code [id number]  Rama, Venetia Maxon, MD Inpatient      Home/SNF/Other Home  Chief Complaint generalized pain  Level of Care/Admitting Diagnosis ED Disposition    ED Disposition Condition Comment   [facility] Area: [place] [[id number]]  Level of Care: Stepdown [14]  Admit to SDU based on following criteria: Severe physiological/psychological symptoms:  Any diagnosis requiring assessment & intervention at least every 4 hours on an ongoing basis to obtain desired patient outcomes including stability and rehabilitation  Diagnosis: DKA (diabetic ketoacidosis)  ([place]) [606301]  Admitting Physician: Karmen Bongo [[id number]]  Attending Physician: Karmen Bongo [[id number]]  Estimated length of stay: 3 - 4 days  Certification:: I certify this patient will need inpatient services for at least 2 midnights  PT Class (Do Not Modify): Inpatient [101]  PT Acc Code (Do Not Modify): Private [1]       Medical History Past Medical History:  Diagnosis Date  . Depression   . Fibromyalgia   . Headache(784.0)   . History of colon polyps   . History of diabetes with ketoacidosis   . Hx of colonic polyps   . Hypertension   . Insomnia   . Insulin pump in place   . RBBB (right bundle branch block)   . RLQ abdominal pain   . Thickened endometrium   . Type 1 diabetes mellitus with long-term current use of insulin [facility]) endocrinologist-- dr Chalmers Cater   July 2015--  pt took Invokana and caused complete pancreas shut-down , pt went from pre-diabetic to adult type 1 dm now insulin dependent  . Vertigo     Allergies Allergies  Allergen Reactions  . Armodafinil Other (See Comments)    Flu-like symptoms  . Invokana [Canagliflozin] Other (See Comments)    "shut down pancreas"   . Lasix [Furosemide] Diarrhea    Severe diarrhea  . Statins     Other reaction(s): Myalgias (muscle pain)  . Adhesive [Tape] Itching and Rash    Pt has not  tried paper tape    IV Location/Drains/Wounds Patient Lines/Drains/Airways Status   Active Line/Drains/Airways    Name:   Placement date:   Placement time:   Site:   Days:   Implanted Port 07/07/17 Right Chest   07/07/17    1455    Chest   58   Closed System Drain 1 Left;Posterior Back Other (Comment) 10 Fr.   04/30/17    1644    Back   126   Closed System Drain 2 Right;Posterior Back Other (Comment) 10 Fr.   04/30/17    1644    Back   126   Closed System Drain Left Back Bulb (JP) 16 Fr.   05/21/17    1030    Back   105   Incision (Closed) 12/25/15 Abdomen Other (Comment)   12/25/15    0804     618   Incision (Closed) 12/25/15  Perineum Other (Comment)   12/25/15    0804     618   Incision - 3 Ports Abdomen 1: Umbilicus 2: Right;Mid 3: Left;Mid   12/25/15    -     618          Labs/Imaging Results for orders placed or performed during the hospital encounter of 09/02/17 (from the past 48 hour(s))  CBC with Differential/Platelet     Status: Abnormal   Collection Time: 09/02/17 11:31 PM  Result Value Ref Range   WBC 12.1 (H) 4.0 - 10.5 K/uL   RBC 3.10 (L) 3.87 - 5.11 MIL/uL   Hemoglobin 9.2 (L) 12.0 - 15.0 g/dL   HCT 30.2 (L) 36.0 - 46.0 %   MCV 97.4 78.0 - 100.0 fL    Comment: DELTA CHECK NOTED REPEATED TO VERIFY    MCH 29.7 26.0 - 34.0 pg   MCHC 30.5 30.0 - 36.0 g/dL   RDW 21.9 (H) 11.5 - 15.5 %   Platelets 426 (H) 150 - 400 K/uL   Neutrophils Relative % 74 %   Neutro Abs 8.9 1.7 - 7.7 K/uL   Lymphocytes Relative 16 %   Lymphs Abs 1.9 0.7 - 4.0 K/uL   Monocytes Relative 10 %   Monocytes Absolute 1.2 (H) 0.1 - 1.0 K/uL   Eosinophils Relative 0 %   Eosinophils Absolute 0.0 0.0 - 0.7 K/uL   Basophils Relative 0 %   Basophils Absolute 0.0 0.0 - 0.1 K/uL   WBC Morphology DOHLE BODIES     Comment: MILD LEFT SHIFT (1-5% METAS, OCC MYELO, OCC BANDS) Performed at [facility], [place] [address] [place], [place] [address]   Comprehensive metabolic panel     Status: Abnormal   Collection Time: 09/02/17 11:31 PM  Result Value Ref Range   Sodium 140 135 - 145 mmol/L   Potassium 4.7 3.5 - 5.1 mmol/L    Comment: DELTA CHECK NOTED   Chloride 109 98 - 111 mmol/L   CO2 <7 (L) 22 - 32 mmol/L   Glucose, Bld 524 (HH) 70 - 99 mg/dL    Comment: CRITICAL RESULT CALLED TO, READ BACK BY AND VERIFIED WITH: Dolphus Jenny '@[phone]'  09/03/17 MKELLY    BUN 7 (L) 8 - 23 mg/dL   Creatinine, Ser 1.47 (H) 0.44 - 1.00 mg/dL   Calcium 9.4 8.9 - 10.3 mg/dL   Total Protein 6.8 6.5 - 8.1 g/dL   Albumin 2.9 (L) 3.5 - 5.0 g/dL   AST 11 (L) 15 - 41 U/L   ALT 8 0 - 44 U/L  Alkaline Phosphatase 99 38 - 126 U/L    Total Bilirubin 1.8 (H) 0.3 - 1.2 mg/dL   GFR calc non Af Amer 35 (L) >60 mL/min   GFR calc Af Amer 41 (L) >60 mL/min    Comment: (NOTE) The eGFR has been calculated using the CKD EPI equation. This calculation has not been validated in all clinical situations. eGFR's persistently <60 mL/min signify possible Chronic Kidney Disease.    Anion gap NOT CALCULATED 5 - 15    Comment: Performed at [facility], [place] [address], [place], [place] [address]  Lipase, blood     Status: None   Collection Time: 09/02/17 11:31 PM  Result Value Ref Range   Lipase 17 11 - 51 U/L    Comment: Performed at [facility], [place] [address] [place], [place] [address]  Blood gas, venous     Status: Abnormal   Collection Time: 09/02/17 11:35 PM  Result Value Ref Range   pH, Ven 7.009 (LL) 7.250 - 7.430    Comment: RBV Ezequiel Essex, MD AT 2341 BY T. KNAPP, RRT, RCP ON 09/02/17   pCO2, Ven BELOW REPORTABLE RANGE 44.0 - 60.0 mmHg    Comment: RBV Ezequiel Essex, MD AT 2341 BY T. KNAPP, RRT, RCP ON 09/02/17   pO2, Ven 72.3 (H) 32.0 - 45.0 mmHg   O2 Saturation 85.6 %   Patient temperature 98.6    Collection site VEIN    Drawn by [id number]    Sample type VEIN     Comment: Performed at [facility], [place] [address] [place], [place] [address]  CBG monitoring, ED     Status: Abnormal   Collection Time: 09/02/17 11:36 PM  Result Value Ref Range   Glucose-Capillary 443 (H) 70 - 99 mg/dL  I-Stat CG4 Lactic Acid, ED     Status: Abnormal   Collection Time: 09/02/17 11:47 PM  Result Value Ref Range   Lactic Acid, Venous 3.42 (HH) 0.5 - 1.9 mmol/L   Comment NOTIFIED PHYSICIAN   CBG monitoring, ED     Status: Abnormal   Collection Time: 09/03/17  1:21 AM  Result Value Ref Range   Glucose-Capillary 447 (H) 70 - 99 mg/dL   Ct Head Wo Contrast  Result Date: 09/03/2017 CLINICAL DATA:  Encephalopathy.  History of stomach cancer. EXAM: CT HEAD WITHOUT CONTRAST TECHNIQUE:  Contiguous axial images were obtained from the base of the skull through the vertex without intravenous contrast. COMPARISON:  None. FINDINGS: Brain: No intra-axial mass, edema or midline shift. No large vascular territory infarct. Mild involutional changes of the brain with minimal small vessel ischemic disease. Midline fourth ventricle and basal cisterns without effacement. Vascular: No hyperdense vessel sign or unexpected calcifications. Skull: No aggressive osseous lesions.  Fracture. Sinuses/Orbits: No acute finding. Other: None. IMPRESSION: Minimal small vessel ischemic disease a periventricular white matter. No acute intracranial abnormality. Electronically Signed   By: Ashley Royalty M.D.   On: 09/03/2017 01:28   Dg Abdomen Acute W/chest  Result Date: 09/03/2017 CLINICAL DATA:  Gastric cancer.  Abdominal pain. EXAM: DG ABDOMEN ACUTE W/ 1V CHEST COMPARISON:  CT abdomen pelvis 08/31/2017 FINDINGS: There is a right chest wall power-injectable Port-A-Cath with tip in the lower SVC via a right internal jugular vein approach. There is no evidence of dilated bowel loops or free intraperitoneal air. No radiopaque calculi or other significant radiographic abnormality is seen. Heart size and mediastinal contours are within normal limits. Both lungs are clear.  IMPRESSION: Negative abdominal radiographs.  No acute cardiopulmonary disease. Electronically Signed   By: Ulyses Jarred M.D.   On: 09/03/2017 00:47    Pending Labs Unresulted Labs (From admission, onward)    Start     Ordered   09/03/17 0127  Lactic acid, plasma  STAT Now then every 3 hours,   R     09/03/17 0126   09/03/17 3299  Basic metabolic panel  STAT Now then every 4 hours ,   STAT     09/03/17 0126          Vitals/Pain Today's Vitals   09/03/17 0100 09/03/17 0103 09/03/17 0130 09/03/17 0200  BP: 123/69 123/69 118/67 129/80  Pulse: (!) 26 (!) 115 (!) 108 (!) 119  Resp: 20 (!) '23 15 20  ' Temp:      TempSrc:      SpO2: 90% 100% 100%  100%  Weight:      Height:      PainSc:        Isolation Precautions No active isolations  Medications Medications  sodium chloride 0.9 % bolus 1,000 mL (0 mLs Intravenous Stopped 09/03/17 0156)    And  sodium chloride 0.9 % bolus 1,000 mL (1,000 mLs Intravenous New Bag/Given 09/03/17 0109)  fentaNYL (DURAGESIC - dosed mcg/hr) patch 25 mcg (has no administration in time range)  enoxaparin (LOVENOX) injection 85 mg (has no administration in time range)  0.9 %  sodium chloride infusion (has no administration in time range)  0.9 %  sodium chloride infusion (has no administration in time range)  dextrose 5 %-0.45 % sodium chloride infusion (has no administration in time range)  insulin regular (NOVOLIN R,HUMULIN R) 100 Units in sodium chloride 0.9 % 100 mL (1 Units/mL) infusion (7.7 Units/hr Intravenous New Bag/Given 09/03/17 0154)  potassium chloride 10 mEq in 100 mL IVPB (has no administration in time range)  fentaNYL (SUBLIMAZE) injection 25 mcg (has no administration in time range)  ondansetron (ZOFRAN) injection 4 mg (has no administration in time range)  sodium chloride 0.9 % bolus 1,000 mL (0 mLs Intravenous Stopped 09/03/17 0102)  HYDROmorphone (DILAUDID) injection 1 mg (1 mg Intravenous Given 09/02/17 2328)  ondansetron (ZOFRAN) injection 4 mg (4 mg Intravenous Given 09/02/17 2347)    Mobility walks

## 2017-09-03 NOTE — Care Management Note (Signed)
Case Management Note  Patient Details  Name: Katie Palmer MRN: [id number] Date of Birth: [birth year]  Subjective/Objective:   Readmission due lack of pain control -endometrial cancer,and dka glucose 524 on admission.               Action/Plan: Hospice referral if ok with patient and md/advanced hhc in place/iv insulin drip/iv d51/2ns,   Expected Discharge Date:                  Expected Discharge Plan:  [place]  In-House Referral:  Hospice / Palliative Care  Discharge planning Services  CM Consult  Post Acute Care Choice:  Home Health Choice offered to:  Patient  DME Arranged:    DME Agency:     HH Arranged:  RN [place] Agency:  [place]  Status of Service:  In process, will continue to follow  If discussed at Long Length of Stay Meetings, dates discussed:    Additional Comments:  Leeroy Cha, RN 09/03/2017, 9:28 AM

## 2017-09-03 NOTE — Consult Note (Signed)
[facility] NOTE  Patient Care Team: Katie Morale, MD as PCP - General Katie Pi, MD as Consulting Physician (Endocrinology)  ASSESSMENT & PLAN:   Endometrial cancer [facility]) She is not a candidate for further treatment and while she was lucid, she has made informed decision not to pursue palliative chemotherapy.  Given significant decline of her mental status, I think inpatient/residential hospice facility would be appropriate  Large IVC clot This is due to cancer recurrence and recent dehydration Continue Lovenox for now Once her son is able to get some rest, he can make decision for her whether to discontinue Lovenox eventually upon transition of care to comfort measures.  I would not recommend continuing Lovenox in residential hospice facility due to risk of bleeding  Bilateral hydronephrosis secondary to ureteric obstruction from tumor recurrence Currently, she does not have renal failure but is at high risk due to bilateral hydronephrosis Serum creatinine is stable.  Cancer pain versus pain from DKA Due to potential unreliable oral absorption, I have started her on fentanyl patch along with intermittent PRN IV morphine as needed for pain from prior hospital stay.  Since admission, she has been getting intermittent IV doses of fentanyl as needed.  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 (Katie Palmer), recent DKA She will continue close monitoring and insulin as needed At some point, I felt that insulin pump is not appropriate to continue Her son will have to make decision when to discontinue blood sugar monitoring and insulin treatment  Progressive anemia Could be due to hemodilution versus anemia chronic illness She does not need blood transfusion for now  Goals of care discussion CODE STATUS is confirmed to be DNR Her son is her dedicated healthcare power  of attorney Given recent quick readmission to the hospital and progressive decline in performance status, her son is in agreement not to pursue advanced home care IV fluid support anymore He felt that when he is ready, he will transition her care to comfort measures He is receptive to have further discussion about goals of care with palliative care consult team tomorrow  Discharge planning She will benefit from residential hospice facility upon discharge  I spent approximately 40 minutes or so addressing all of Katie Palmer' concerns.   Katie Lark, MD 09/03/2017 4:13 PM   CHIEF COMPLAINTS/PURPOSE OF CONSULTATION:  Recurrent uterine cancer, for further management  HISTORY OF PRESENTING ILLNESS:  Katie Palmer 67 y.o. female is seen at the request of her son due to readmission to the hospital. At the time of evaluation around 330 pm today, the patient appears to be confused. Further history is not possible. I spoke with the nursing staff who looked after her.  The patient has just received IV fentanyl for pain.  Then, I called her son, Katie Palmer over the phone and spent another 40 minutes talking to him.  The patient's son was very emotional and tearful over the telephone.  I left her room at approximately 7:50 AM yesterday.  After almost an hour discussion, we are in agreement to consult Katie Palmer agency for IV fluid support along with IV antiemetics at home to give her a few days to complete her paperwork before transitioning her care to palliative care/hospice.  In my dictation, and at the time of evaluation, the patient appears to be comfortable and her pain control was adequate with fentanyl patch alone. However, according to her  son, the patient appears to be in mild distress, curling up into "fetal position" and was in pain approximately at 9 AM.  He said that he has called to get the nurse to give her some pain medicine but nothing was given because no orders were written for as  needed pain.  Katie Palmer then left to go to home to get some rest.  Later, he was contacted by Katie Palmer that his mother is ready to be discharge.  He specifically stated that he was told it was not an option to keep her mother because it did not fulfill medical necessity to keep her in the hospital longer and he felt he has no choice but to take her home even though he was concerned about her uncontrolled pain.  He came to pick his mother at around 51 PM.  At the time, she was in significant pain and distress.  According to Katie Palmer, his mother did not want to leave.  The response he received was, "Oh, no, no, you have to go". Again, when he inquired about pain medicine, he was told again that no specific pain medicine was available for her and the nurse could not give his mother anything for pain. His mother cried the entire time from the floor all the way home.  Upon arriving to home, he stated he look at her blood sugar monitor and her blood sugar has been between 127 to 130 range. Next, he tried to pick up her fentanyl patch prescription from [facility] on [address].  He was told specifically by the pharmacist that they cannot pick up prescription fentanyl patch until August 10th because that is how the prescription was specifically written.  Due to her uncontrolled pain, he contacted my office for further help.  At that time, my nursing staff have advised the patient to call 911 to bring her to the hospital.  He told me that his mother did not want to go back because, "what would be the point since they will not give her any pain medicine".  Subsequently, with the help of a close friend, Katie Palmer, he was able to get her into the car and bring her to the emergency room.  In the emergency room, blood work revealed that the patient has diabetic ketoacidosis.  Again, according to Katie Palmer, her blood sugar was well controlled at home.  According to documentation of electronic records, her blood  glucose level yesterday morning around 430 am was high at 258.  I personally recalled that her insulin pump had been beeping multiple times during our goals of care discussion yesterday morning.  At approximately 8 AM, her nurse documented that the patient has declined blood sugar check.  Katie Palmer thought that her mother might be adjusting the blood sugar herself based on the glucose monitor by directly adjusting her insulin pump.  However, due to her uncontrolled pain at home, the patient subsequently "ripped" the insulin pump of her body because she is planning to "just die" rather than suffer from pain.  At that time, her mental status has changed into the behavior of a "young girl" and he blames himself for not recognizing the signs or symptoms of diabetic ketoacidosis.  Since admission to the hospital, she has received aggressive IV fluid resuscitation, IV insulin and close blood sugar monitoring.  CT of the head was ordered to rule out intracranial metastasis that came back negative.  Summary of oncologic history is as follows: Oncology History   High grade carcinosarcoma  Endometrial cancer ([place])   12/25/2015 Pathology Results    1. Ovary and fallopian tube, right - BENIGN OVARIAN FIBROTHECOMA. - NO ENDOMETRIOSIS OR MALIGNANCY. - BENIGN FALLOPIAN TUBE WITH BENIGN PARATUBAL CYST. 2. Endometrium, curettage - INACTIVE ENDOMETRIUM, BENIGN ENDOMETRIAL POLYP AND BENIGN SQUAMOUS FRAGMENTS. - NO HYPERPLASIA OR MALIGNANCY    12/25/2015 Surgery    PRE-OPERATIVE DIAGNOSIS:  RLQ PAIN, thickened endometrium  POST-OPERATIVE DIAGNOSIS:  R O lesion, adhesions, polyp in uterus  PROCEDURE:  Procedure(s): DILATATION AND CURETTAGE /HYSTEROSCOPY (N/A) LAPAROSCOPY OPERATIVE (N/A) LAPAROSCOPIC LYSIS OF ADHESIONS (N/A) LAPAROSCOPIC UNILATERAL SALPINGO OOPHORECTOMY (Right      01/25/2017 Initial Diagnosis    The patient reports postmenopausal bleeding since December, 2018. Pap smear showed  AGUS.       03/17/2017 Pathology Results    Endometrial biopsy on 03/17/17 showed grade 3 endometrial adenocarcinoma.    03/26/2017 Imaging    Ct abdomen Diffuse endometrial thickening consistent with endometrial carcinoma, with probable focal area of deep myometrial invasion in the anterior fundus.  No evidence of extra uterine tumor extension, lymphadenopathy, or distant metastatic disease.  Two tiny bilateral lower lobe pulmonary nodules, largest measuring 5 mm. These are indeterminate, but likely benign. Recommend follow-up with chest CT without contrast in 3-6 months.    04/15/2017 Surgery    Operation: Robotic-assisted laparoscopic total hysterectomy with left salpingoophorectomy, SLN biopsy, para-aortic lymphadenectomy (bilateral), vaginal biopsy.  Surgeon: Donaciano Eva  Operative Findings:  : 92mm nodule on left proximal (upper) vaginal side wall (benign on frozen). 8cm normal appearing uterus, surgically absent right ovary and tube, normal left ovary somewhat adherent to round ligament. Indurated left external iliac SLN. No other suspicious nodes or apparent extrauterine disease      04/15/2017 Pathology Results    1. Vagina, biopsy - ATYPICAL CELLS, SUSPICIOUS BUT NOT DIAGNOSTIC OF MALIGNANCY. SEE NOTE 2. Lymph node, sentinel, biopsy, right presacral #1 - LYMPH NODE, NEGATIVE FOR MALIGNANCY (0/1) - IMMUNOSTAIN FOR PANKERATIN SHOWS FOCAL NONSPECIFIC LABELING 3. Lymph node, sentinel, biopsy, right presacral #2 - LYMPH NODE, NEGATIVE FOR MALIGNANCY (0/1) - IMMUNOSTAIN FOR PANKERATIN IS NEGATIVE 4. Lymph node, sentinel, biopsy, left external iliac - LYMPH NODE, NEGATIVE FOR MALIGNANCY (0/1) - IMMUNOSTAIN FOR PANKERATIN IS NEGATIVE 5. Lymph node, biopsy, right periaortic - TWO LYMPH NODES, NEGATIVE FOR MALIGNANCY (0/2) 6. Lymph node, biopsy, left periaortic - SEVEN LYMPH NODES, NEGATIVE FOR MALIGNANCY (0/7) 7. 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 SEE NOTE - TUMOR INVADES A LITTLE OVER HALF OF THE MYOMETRIUM (INVASION DEPTH OF 0.7 CM WHERE MYOMETRIUM IS 1.3 CM THICK) 1. Vagina, biopsy - ATYPICAL CELLS, SUSPICIOUS BUT NOT DIAGNOSTIC OF MALIGNANCY. SEE NOTE 2. Lymph node, sentinel, biopsy, right presacral #1 - LYMPH NODE, NEGATIVE FOR MALIGNANCY (0/1) - IMMUNOSTAIN FOR PANKERATIN SHOWS FOCAL NONSPECIFIC LABELING 3. Lymph node, sentinel, biopsy, right presacral #2 - LYMPH NODE, NEGATIVE FOR MALIGNANCY (0/1) - IMMUNOSTAIN FOR PANKERATIN IS NEGATIVE 4. Lymph node, sentinel, biopsy, left external iliac - LYMPH NODE, NEGATIVE FOR MALIGNANCY (0/1) - IMMUNOSTAIN FOR PANKERATIN IS NEGATIVE 5. Lymph node, biopsy, right periaortic - TWO LYMPH NODES, NEGATIVE FOR MALIGNANCY (0/2) 6. Lymph node, biopsy, left periaortic - SEVEN LYMPH NODES, NEGATIVE FOR MALIGNANCY (0/7) 7. 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 SEE NOTE - TUMOR INVADES A LITTLE OVER  HALF OF THE MYOMETRIUM (INVASION DEPTH OF 0.7 CM WHERE MYOMETRIUM IS 1.3 CM THICK)  7. ONCOLOGY TABLE-UTERUS, CARCINOMA OR CARCINOSARCOMA 1 of 4 Duplicate copy FINAL for Dannenberg, Jennfier [phone]) Microscopic Comment(continued) Specimen: Uterus with left fallopian tube and ovary Procedure: Total hysterectomy with left salpingoophorectomy Lymph node sampling performed: Sentinel and non-sentinel Specimen integrity: Intact Maximum tumor size: 4.2 cm Histologic type: Carcinosarcoma (Malignant mullerian mixed tumor) Grade: High-grade Myometrial invasion: 0.7 cm where myometrium is 1.3 cm in thickness Cervical stromal involvement: Not identified Extent of involvement of other organs: Not involved Lymph - vascular invasion: Not  identified Peritoneal washings: NA Lymph nodes: Examined: 3 Sentinel 9 Non-sentinel 12 Total Lymph nodes with metastasis: 0 Isolated tumor cells (< 0.2 mm): 0 Micrometastasis: (> 0.2 mm and < 2.0 mm): 0 Macrometastasis: (> 2.0 mm): 0 Extracapsular extension: NA TNM code: pT1b, pN0 FIGO Stage (based on pathologic findings, needs clinical correlation): 1B    04/29/2017 Imaging    CT abdomen Multiloculated bilateral retroperitoneal fluid collections, consistent with postoperative lymphoceles.  No evidence of hydronephrosis or other complication    08/03/2954 - 05/02/2017 Hospital Admission    She was admitted for evaluation of severe pain. CT showed fluid collection and IR was consulted. Drain was placed to drain the fluid collection    04/30/2017 Cancer Staging    Staging form: Corpus Uteri - Carcinoma and Carcinosarcoma, AJCC 8th Edition - Pathologic: Stage IB (pT1b, pN0, cM0) - Signed by Katie Lark, MD on 04/30/2017    04/30/2017 Procedure    Successful abdominal abscess drain x2.    05/06/2017 Imaging    1. Interval retraction of right-sided percutaneous drainage catheter with end now coiled and locked peripheral the grossly unchanged right-sided retroperitoneal fluid collection, presumed postoperative seroma. 2. Unchanged positioning of left-sided percutaneous drainage catheter with end coiled and locked within the peripheral aspect of the grossly unchanged left-sided retroperitoneal fluid collection, presumed postoperative seroma.  PLAN: - The malpositioned right-sided retroperitoneal drainage catheter was removed at the patient's bedside without incident    05/18/2017 Imaging    Right retroperitoneal fluid collections are improved. The right drain has been repositioned into the more anterior fluid collection which is smaller than on the prior study.  The lateral left retroperitoneal fluid collection has resolved after drainage. The more medial and anterior fluid collection has  increased in size. The drain is not coiled in this more medial fluid collection.    06/03/2017 Imaging    New left retroperitoneal drain has been placed into a fluid collection. This fluid collection has improved from 4.4 x 4.5 cm to 3.2 x 3.8 cm.  The other left-sided drain remains in place without associated abscess.  The right-sided fluid collection containing a drain has resolved.  No new fluid collection.  Other incidental findings are noted.    06/09/2017 Imaging    Unchanged position of left retroperitoneal pigtail drainage catheter, with decreasing size of residual rim enhancing fluid collection/abscess.  Interval removal of retroperitoneal pigtail drainage catheter from the right.  Mild dilation of the left collecting system and left ureter, with the proximal left ureter inseparable from the inflammatory changes adjacent to the pigtail catheter. I would favor this not to represent ureteral obstruction, but instead reactive dilation.    06/24/2017 Imaging    Resolution of left-sided retroperitoneal lymphocele at the level of the single remaining retroperitoneal drainage catheter. Small residual lymphoceles are identified in the right retroperitoneum lateral to the right common iliac artery and vein measuring approximately 1.9 cm  in greatest diameter and adjacent to the lower pole of the right kidney measuring 9 mm. Two other small residual lymphoceles are seen adjacent to the left external iliac artery measuring 11 mm and 10 mm respectively. All of these residual small collections are far smaller than the original collections present postoperatively on the 04/29/2017 scan and are currently too small to warrant drainage.  The left retroperitoneal drain was removed today. See separately dictated clinic note in Epic.    07/07/2017 Procedure    Successful placement of a right IJ approach Power Port with ultrasound and fluoroscopic guidance. The catheter is ready for use.    07/09/2017 -   Chemotherapy    The patient had carboplatin and taxol     MEDICAL HISTORY:  Past Medical History:  Diagnosis Date  . Depression   . Fibromyalgia   . Headache(784.0)   . History of colon polyps   . History of diabetes with ketoacidosis   . Hx of colonic polyps   . Hypertension   . Insomnia   . Insulin pump in place   . RBBB (right bundle branch block)   . RLQ abdominal pain   . Thickened endometrium   . Type 1 diabetes mellitus with long-term current use of insulin [facility]) endocrinologist-- dr Chalmers Cater   July 2015--  pt took Invokana and caused complete pancreas shut-down , pt went from pre-diabetic to adult type 1 dm now insulin dependent  . Vertigo     SURGICAL HISTORY: Past Surgical History:  Procedure Laterality Date  . CARDIOVASCULAR STRESS TEST  07/26/2015   normal myocardial perfusion study w/ no ischemia/  normal LV function and wall motion , ef 71%  . COLONOSCOPY WITH PROPOFOL  last one 10/ 2017  . HYSTEROSCOPY W/D&C N/A 12/25/2015   Procedure: DILATATION AND CURETTAGE /HYSTEROSCOPY;  Surgeon: Bobbye Charleston, MD;  Location: [facility];  Service: Gynecology;  Laterality: N/A;  . IR IMAGING GUIDED PORT INSERTION  07/07/2017  . IR RADIOLOGIST EVAL & MGMT  05/06/2017  . IR RADIOLOGIST EVAL & MGMT  05/18/2017  . IR RADIOLOGIST EVAL & MGMT  05/13/2017  . IR RADIOLOGIST EVAL & MGMT  06/03/2017  . IR RADIOLOGIST EVAL & MGMT  06/09/2017  . IR RADIOLOGIST EVAL & MGMT  06/24/2017  . LAPAROSCOPIC LYSIS OF ADHESIONS N/A 12/25/2015   Procedure: LAPAROSCOPIC LYSIS OF ADHESIONS;  Surgeon: Bobbye Charleston, MD;  Location: [place];  Service: Gynecology;  Laterality: N/A;  . LAPAROSCOPIC UNILATERAL SALPINGO OOPHERECTOMY Right 12/25/2015   Procedure: LAPAROSCOPIC UNILATERAL SALPINGO OOPHORECTOMY;  Surgeon: Bobbye Charleston, MD;  Location: [place];  Service: Gynecology;  Laterality: Right;  . LAPAROSCOPY N/A 12/25/2015   Procedure:  LAPAROSCOPY OPERATIVE;  Surgeon: Bobbye Charleston, MD;  Location[facility];  Service: Gynecology;  Laterality: N/A;  . LYMPH NODE BIOPSY N/A 04/15/2017   Procedure: SENTINEL LYMPH NODE BIOPSY, PERIAORTIC LYMPH NODES, AND VAGINAL BIOPSY;  Surgeon: Everitt Amber, MD;  Location: [facility];  Service: Gynecology;  Laterality: N/A;  . ROBOTIC ASSISTED TOTAL HYSTERECTOMY WITH BILATERAL SALPINGO OOPHERECTOMY Bilateral 04/15/2017   Procedure: XI ROBOTIC ASSISTED TOTAL LAPROSCOPIC  HYSTERECTOMY WITH LEFT SALPINGO OOPHORECTOMY;  Surgeon: Everitt Amber, MD;  Location: [facility];  Service: Gynecology;  Laterality: Bilateral;  . TONSILLECTOMY  age 86  . TRANSTHORACIC ECHOCARDIOGRAM  07/26/2015   grade 1 diastolic dysfunction , ef 60-65%/  trivial TR    SOCIAL HISTORY: Social History   Socioeconomic History  . Marital status: Divorced  Spouse name: Not on file  . Number of children: 1  . Years of education: Not on file  . Highest education level: Not on file  Occupational History  . 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 Financial resource strain: Not on file  . Food insecurity:    Worry: Not on file    Inability: Not on file  . Transportation needs:    Medical: Not on file    Non-medical: Not on file  Tobacco Use  . Smoking status: Never Smoker  . Smokeless tobacco: Never Used  Substance and Sexual Activity  . Alcohol use: No    Alcohol/week: 0.0 standard drinks  . Drug use: No  . Sexual activity: Not Currently    Birth control/protection: Post-menopausal  Lifestyle  . Physical activity:    Days per week: Not on file    Minutes per session: Not on file  . Stress: Not on file  Relationships  . Social connections:    Talks on phone: Not on file    Gets together: Not on file    Attends religious service: Not on file    Active member of club or organization: Not on file    Attends meetings of clubs or organizations: Not on file    Relationship status: Not on file  .  Intimate partner violence:    Fear of current or ex partner: Not on file    Emotionally abused: Not on file    Physically abused: Not on file    Forced sexual activity: Not on file  Other Topics Concern  . Not on file  Social History Narrative   Divorced.  Son lives with her.  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    FAMILY HISTORY: Family History  Problem Relation Age of Onset  . Alcohol abuse Father        Died age 53  . Heart disease Father   . Aneurysm Mother        Died age 46  . Leukemia Brother        Died age 25  . COPD Sister   . Rheum arthritis Sister   . Heart failure Sister   . Dementia Maternal Uncle   . Colon cancer Neg Hx     ALLERGIES:  is allergic to armodafinil; invokana [canagliflozin]; lasix [furosemide]; statins; and adhesive [tape].  MEDICATIONS:  Current Facility-Administered Medications  Medication Dose Route Frequency Provider Last Rate Last Dose  . 0.9 %  sodium chloride infusion   Intravenous Continuous Karmen Bongo, MD   Stopped at 09/03/17 [phone]  . 0.9 %  sodium chloride infusion   Intravenous Continuous Kayleen Memos, DO   Stopped at 09/03/17 1509  . dextrose 5 %-0.45 % sodium chloride infusion   Intravenous Continuous Karmen Bongo, MD 125 mL/hr at 09/03/17 0600    . enoxaparin (LOVENOX) injection 95 mg  1 mg/kg Subcutaneous Q12H Karmen Bongo, MD   95 mg at 09/03/17 0272  . feeding supplement (BOOST / RESOURCE BREEZE) liquid 1 Container  1 Container Oral TID BM Hall, Carole N, DO      . fentaNYL (DURAGESIC - dosed mcg/hr) patch 25 mcg  25 mcg Transdermal Q72H Karmen Bongo, MD   25 mcg at 09/03/17 0334  . fentaNYL (SUBLIMAZE) injection 25 mcg  25 mcg Intravenous Q1H PRN Karmen Bongo, MD   25 mcg at 09/03/17 1423  . insulin regular (NOVOLIN R,HUMULIN R) 100 Units in sodium chloride  0.9 % 100 mL (1 Units/mL) infusion   Intravenous Continuous Karmen Bongo, MD 0.5 mL/hr at 09/03/17 1605 0.5 Units/hr at 09/03/17  1605  . LORazepam (ATIVAN) tablet 1 mg  1 mg Oral Q4H PRN Earlie Counts, NP       Or  . LORazepam (ATIVAN) 2 MG/ML concentrated solution 1 mg  1 mg Sublingual Q4H PRN Earlie Counts, NP       Or  . LORazepam (ATIVAN) injection 1 mg  1 mg Intravenous Q4H PRN Earlie Counts, NP      . MEDLINE mouth rinse  15 mL Mouth Rinse BID Hall, Carole N, DO      . ondansetron [facility]) injection 4 mg  4 mg Intravenous Q6H PRN Karmen Bongo, MD      . oxybutynin (DITROPAN) 5 MG/5ML syrup 5 mg  5 mg Oral BID Mariana Kaufman J, NP      . potassium chloride 10 mEq in 100 mL IVPB  10 mEq Intravenous Q1 Hr x 4 Hall, Carole N, DO 100 mL/hr at 09/03/17 1510      REVIEW OF SYSTEMS:  As above, unable to assess due to change of mental status  PHYSICAL EXAMINATION: ECOG PERFORMANCE STATUS: 4 - Bedbound  Vitals:   09/03/17 1300 09/03/17 1500  BP: 116/64 (!) 146/90  Pulse:    Resp: 17 (!) 28  Temp:    SpO2: 96% 96%   Filed Weights   09/02/17 2243  Weight: 204 lb 12.9 oz (92.9 kg)    GENERAL: She appears confused.  Open her eyes when I talk to her but unresponsive  LABORATORY DATA:  I have reviewed the data as listed Lab Results  Component Value Date   WBC 7.3 09/03/2017   HGB 7.4 (L) 09/03/2017   HCT 22.8 (L) 09/03/2017   MCV 92.3 09/03/2017   PLT 238 09/03/2017   Recent Labs    10/29/16 1105  08/24/17 1329 08/29/17 1622  09/02/17 2331  09/03/17 0617 09/03/17 1019 09/03/17 1456  NA 141   < > 136 138   < > 140   < > 143 141 142  K 4.4   < > 3.1* 2.7*   < > 4.7   < > 4.0 3.2* 3.6  CL 102   < > 92* 97*   < > 109   < > 115* 117* 117*  CO2 32   < > 31 28   < > <7*   < > 14* 17* 20*  GLUCOSE 160*   < > 202* 159*   < > 524*   < > 219* 200* 144*  BUN 18   < > 15 8   < > 7*   < > 7* 7* 8  CREATININE 0.91   < > 1.10* 0.94   < > 1.47*   < > 1.29* 1.09* 1.03*  CALCIUM 8.9   < > 9.4 8.2*   < > 9.4   < > 8.4* 8.2* 8.5*  GFRNONAA  --    < > 50* >60   < > 35*   < > 42* 51* 55*  GFRAA  --    < > 58*  >60   < > 41*   < > 48* 59* >60  PROT 6.2   < > 7.0 6.0*  --  6.8  --   --   --   --   ALBUMIN 3.6   < > 2.9* 2.5*  --  2.9*  --   --   --   --  AST 20   < > 10* 12*  --  11*  --   --   --   --   ALT 19   < > <6 8  --  8  --   --   --   --   ALKPHOS 88   < > 102 74  --  99  --   --   --   --   BILITOT 0.4   < > 0.5 1.0  --  1.8*  --   --   --   --   BILIDIR 0.1  --   --   --   --   --   --   --   --   --    < > = values in this interval not displayed.    RADIOGRAPHIC STUDIES: I have personally reviewed the radiological images as listed and agreed with the findings in the report. Ct Head Wo Contrast  Result Date: 09/03/2017 CLINICAL DATA:  Encephalopathy.  History of stomach cancer. EXAM: CT HEAD WITHOUT CONTRAST TECHNIQUE: Contiguous axial images were obtained from the base of the skull through the vertex without intravenous contrast. COMPARISON:  None. FINDINGS: Brain: No intra-axial mass, edema or midline shift. No large vascular territory infarct. Mild involutional changes of the brain with minimal small vessel ischemic disease. Midline fourth ventricle and basal cisterns without effacement. Vascular: No hyperdense vessel sign or unexpected calcifications. Skull: No aggressive osseous lesions.  Fracture. Sinuses/Orbits: No acute finding. Other: None. IMPRESSION: Minimal small vessel ischemic disease a periventricular white matter. No acute intracranial abnormality. Electronically Signed   By: Ashley Royalty M.D.   On: 09/03/2017 01:28   Ct Abdomen Pelvis W Contrast  Result Date: 08/31/2017 CLINICAL DATA:  67 year old female with history of endometrial carcinoma. Chemotherapy discontinued 1 month ago. Radiation therapy planned for the morning. Nausea and vomiting. 13 pound weight loss over the past 3 weeks. Decreased appetite. Evaluate for recurrent disease. EXAM: CT ABDOMEN AND PELVIS WITH CONTRAST TECHNIQUE: Multidetector CT imaging of the abdomen and pelvis was performed using the standard  protocol following bolus administration of intravenous contrast. CONTRAST:  184mL OMNIPAQUE IOHEXOL 300 MG/ML SOLN, 79mL ISOVUE-300 IOPAMIDOL (ISOVUE-300) INJECTION 61% COMPARISON:  CT the abdomen and pelvis 06/24/2017. FINDINGS: Lower chest: Unremarkable. Hepatobiliary: Perfusion anomaly adjacent to the falciform ligament in the left lobe of the liver, similar to prior examinations (a benign finding). No suspicious cystic or solid hepatic lesions. Very mild intrahepatic biliary ductal dilatation, similar to the prior study. No common bile duct dilatation. Gallbladder is moderately distended. No gallstones. No surrounding pericholecystic fluid or inflammatory changes. Pancreas: No pancreatic mass. No pancreatic ductal dilatation. No pancreatic or peripancreatic fluid or inflammatory changes. Spleen: Unremarkable. Adrenals/Urinary Tract: Moderate to severe bilateral hydroureteronephrosis. On the right, this appears related to obstruction of the mid right ureter related to retroperitoneal nodal masses which are centrally low-attenuation (likely internally necrotic) and have increased in size, with the largest of these currently measuring 3.0 x 2.1 x 5.8 cm (axial image 56 of series 2 and coronal image 45 of series 4). On the left, the ureteral obstruction is in the distal third of the ureter related to masses along the pelvic sidewall (discussed below) which appear to exert mass effect upon the distal ureter. No suspicious renal lesions. Bilateral adrenal glands are normal in appearance. Urinary bladder is normal in appearance. Stomach/Bowel: Normal appearance of the stomach. No pathologic dilatation of small bowel or colon. Normal appendix. Vascular/Lymphatic: Aortic  atherosclerosis. No aneurysm or dissection noted in the abdominal or pelvic vasculature. Large filling defect in the inferior vena cava measuring 1.7 x 2.1 x 1.1 cm (axial image 40 of series 2 and coronal image 46 of series 4), compatible with a  nonocclusive thrombus. Increased number and size of centrally low-attenuation lesions in the retroperitoneum bilaterally and along the pelvic sidewalls (left greater than right), favored to reflect necrotic nodal metastases. The largest of these is in the left retroperitoneum where it partially invades the left psoas muscle (axial image 56 of series 2 and coronal image 48 of series 4) measuring 2.5 x 4.9 x 3.6 cm. Several lesions are also noted along the left pelvic sidewall in the distribution of the left external iliac lymph nodes measuring 2.1 x 2.4 cm (axial image 76 of series 2) and 3.1 x 2.3 cm (axial image 71 of series 2). Reproductive: Status post hysterectomy. Ovaries are not confidently identified may be surgically absent or atrophic. Other: In addition to the presumed nodal metastases discussed above, there are other lesions, including a 1.8 x 2.6 cm lesion in the inferior aspect of the right psoas muscle (axial image 65 of series 2), and a lesion in the left lower quadrant (axial image 67 of series 2) measuring 1.9 x 2.3 cm, which likely reflects a peritoneal implant. Extensive thickening of the presacral soft tissues, new compared to the prior study, potentially treatment related if the patient has had radiation therapy, although this may simply be related to edema from lymphatic congestion. No significant volume of ascites. No pneumoperitoneum. Musculoskeletal: No aggressive appearing lytic or blastic lesions are noted in the visualized portions of the skeleton. IMPRESSION: 1. Findings are highly concerning for progression of disease, with worsening necrotic nodal metastasis in the retroperitoneum and along the pelvic sidewall bilaterally (left greater than right), soft tissue metastasis in the low right psoas muscle, and intraperitoneal metastatic lesion in the left lower quadrant. This retroperitoneal lymphadenopathy is now associated with obstruction of the ureters bilaterally resulting in moderate  to severe bilateral hydroureteronephrosis. 2. 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 This places the patient at substantial risk for devastating pulmonary embolism. 3. Additional incidental findings, similar to prior studies, as above. Critical Value/emergent results were called by telephone at the time of interpretation on 08/31/2017 at 8:22 pm to Dr. Earlie Server, who verbally acknowledged these results. Electronically Signed   By: Vinnie Langton M.D.   On: 08/31/2017 20:22   Dg Abdomen Acute W/chest  Result Date: 09/03/2017 CLINICAL DATA:  Gastric cancer.  Abdominal pain. EXAM: DG ABDOMEN ACUTE W/ 1V CHEST COMPARISON:  CT abdomen pelvis 08/31/2017 FINDINGS: There is a right chest wall power-injectable Port-A-Cath with tip in the lower SVC via a right internal jugular vein approach. There is no evidence of dilated bowel loops or free intraperitoneal air. No radiopaque calculi or other significant radiographic abnormality is seen. Heart size and mediastinal contours are within normal limits. Both lungs are clear. IMPRESSION: Negative abdominal radiographs.  No acute cardiopulmonary disease. Electronically Signed   By: Ulyses Jarred M.D.   On: 09/03/2017 00:47   Dg Abdomen Acute W/chest  Result Date: 08/29/2017 CLINICAL DATA:  Nausea and vomiting for 3 weeks.  History of cancer. EXAM: DG ABDOMEN ACUTE W/ 1V CHEST COMPARISON:  Abdominal radiograph July 12, 2017 FINDINGS: Cardiomediastinal silhouette is normal. Single-lumen RIGHT chest Port-A-Cath distal tip projecting in proximal superior vena cava. Lungs are clear, no pleural effusions. No pneumothorax. Soft tissue  planes and included osseous structures are non suspicious. Mildly elevated RIGHT hemidiaphragm. Bowel gas pattern is nondilated and nonobstructive. No significant retained large bowel stool volume. 15 mm linear metallic foreign body projecting at LEFT iliac crest on single view. Phleboliths project in the pelvis. No  intra-abdominal mass effect, pathologic calcifications or free air. Soft tissue planes and included osseous structures are non-suspicious. IMPRESSION: 1. No acute cardiopulmonary process. 2. Normal bowel gas pattern. 3. Needle-like fragment projecting over LEFT iliac crest on single view, potentially external to patient. Electronically Signed   By: Elon Alas M.D.   On: 08/29/2017 16:43

## 2017-09-03 NOTE — H&P (Signed)
History and Physical    Katie Palmer NKN:[id number] DOB: 02-09-1950 DOA: 09/02/2017  PCP: Laurey Morale, MD Patient coming from:  Home; NOK: Son  Chief Complaint: uncontrolled pain  HPI: Katie Palmer is a 67 y.o. female with medical history significant of endometrial cancer, most recent chemotherapy 3 weeks ago with side effects including uncontrolled nausea, vomiting, dehydration. Chemotherapy has been on hold.  Also with past medical history of diabetes, anxiety and depression, essential hypertension.  She was admitted from 8/4 until today and was discharged home but had refractory pain and so returned.  She was discharged today and they met with Dr. Alvy Bimler this AM to discuss the plan - "what to do now that we had stopped treatment."  The plan was to get [place] to get her pain controlled and get all the affairs in order.  He got a call that "they had to discharge her, that they didn't have a choice... And also that they had not adjusted her pain medicine."  The patient did not want to leave the hospital.  She complained of pain all the way home.  She got into bed and was crying and was curled up.  At some point, she pulled out her insulin pump and wrapped it up and put it her bedside drawer - "I think that she did this, as bad of pain as she was in, I think this was her out."  She has been incoherent for hours.  He called Dr. Calton Dach office and they suggested that he bring her back to the ER - but it took 45 minutes to navigate her in the house.  The patient did not want him to call 911.  Eventually, [facility] told him to call 911.  This AM, she wanted "a couple of days to get paperwork done."  After long discussion, they would like to meet with Dr. Alvy Bimler in the AM and for now would like for her DKA to be aggressively treated in the meantime.  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  Sent in for pain control.  Here -  altered, tachypnea, in DKA (not wearing insulin pump).  Family does want labs, xrays, decline head CT.  Comfortable after 1 mg Dilaudid.  Family wants to talk with Dr. Alvy Bimler tomorrow.  Review of Systems: Unable to perform  PMH, PSH, SH, and FH reviewed in chart  Past Medical History:  Diagnosis Date  . Depression   . Fibromyalgia   . Headache(784.0)   . History of colon polyps   . History of diabetes with ketoacidosis   . Hx of colonic polyps   . Hypertension   . Insomnia   . Insulin pump in place   . RBBB (right bundle branch block)   . RLQ abdominal pain   . Thickened endometrium   . Type 1 diabetes mellitus with long-term current use of insulin [facility]) endocrinologist-- dr Chalmers Cater   July 2015--  pt took Invokana and caused complete pancreas shut-down , pt went from pre-diabetic to adult type 1 dm now insulin dependent  . Vertigo     Past Surgical History:  Procedure Laterality Date  . CARDIOVASCULAR STRESS TEST  07/26/2015   normal myocardial perfusion study w/ no ischemia/  normal LV function and wall motion , ef 71%  . COLONOSCOPY WITH PROPOFOL  last one 10/ 2017  . HYSTEROSCOPY W/D&C N/A 12/25/2015   Procedure: DILATATION AND CURETTAGE /HYSTEROSCOPY;  Surgeon: Sharyn Lull  Philis Pique, MD;  Location: [facility];  Service: Gynecology;  Laterality: N/A;  . IR IMAGING GUIDED PORT INSERTION  07/07/2017  . IR RADIOLOGIST EVAL & MGMT  05/06/2017  . IR RADIOLOGIST EVAL & MGMT  05/18/2017  . IR RADIOLOGIST EVAL & MGMT  05/13/2017  . IR RADIOLOGIST EVAL & MGMT  06/03/2017  . IR RADIOLOGIST EVAL & MGMT  06/09/2017  . IR RADIOLOGIST EVAL & MGMT  06/24/2017  . LAPAROSCOPIC LYSIS OF ADHESIONS N/A 12/25/2015   Procedure: LAPAROSCOPIC LYSIS OF ADHESIONS;  Surgeon: Bobbye Charleston, MD;  Location: [place];  Service: Gynecology;  Laterality: N/A;  . LAPAROSCOPIC UNILATERAL SALPINGO OOPHERECTOMY Right 12/25/2015   Procedure: LAPAROSCOPIC UNILATERAL SALPINGO  OOPHORECTOMY;  Surgeon: Bobbye Charleston, MD;  Location: [place];  Service: Gynecology;  Laterality: Right;  . LAPAROSCOPY N/A 12/25/2015   Procedure: LAPAROSCOPY OPERATIVE;  Surgeon: Bobbye Charleston, MD;  Location: [facility];  Service: Gynecology;  Laterality: N/A;  . LYMPH NODE BIOPSY N/A 04/15/2017   Procedure: SENTINEL LYMPH NODE BIOPSY, PERIAORTIC LYMPH NODES, AND VAGINAL BIOPSY;  Surgeon: Everitt Amber, MD;  Location: [facility];  Service: Gynecology;  Laterality: N/A;  . ROBOTIC ASSISTED TOTAL HYSTERECTOMY WITH BILATERAL SALPINGO OOPHERECTOMY Bilateral 04/15/2017   Procedure: XI ROBOTIC ASSISTED TOTAL LAPROSCOPIC  HYSTERECTOMY WITH LEFT SALPINGO OOPHORECTOMY;  Surgeon: Everitt Amber, MD;  Location: [facility];  Service: Gynecology;  Laterality: Bilateral;  . TONSILLECTOMY  age 16  . TRANSTHORACIC ECHOCARDIOGRAM  07/26/2015   grade 1 diastolic dysfunction , ef 60-65%/  trivial TR    Social History   Socioeconomic History  . Marital status: Divorced    Spouse name: Not on file  . Number of children: 1  . Years of education: Not on file  . Highest education level: Not on file  Occupational History  . 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 Financial resource strain: Not on file  . Food insecurity:    Worry: Not on file    Inability: Not on file  . Transportation needs:    Medical: Not on file    Non-medical: Not on file  Tobacco Use  . Smoking status: Never Smoker  . Smokeless tobacco: Never Used  Substance and Sexual Activity  . Alcohol use: No    Alcohol/week: 0.0 standard drinks  . Drug use: No  . Sexual activity: Not Currently    Birth control/protection: Post-menopausal  Lifestyle  . Physical activity:    Days per week: Not on file    Minutes per session: Not on file  . Stress: Not on file  Relationships  . Social connections:    Talks on phone: Not on file    Gets together: Not on file    Attends religious service: Not on file      Active member of club or organization: Not on file    Attends meetings of clubs or organizations: Not on file    Relationship status: Not on file  . Intimate partner violence:    Fear of current or ex partner: Not on file    Emotionally abused: Not on file    Physically abused: Not on file    Forced sexual activity: Not on file  Other Topics Concern  . Not on file  Social History Narrative   Divorced.  Son lives with her.  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    Allergies  Allergen Reactions  . Armodafinil Other (  See Comments)    Flu-like symptoms  . Invokana [Canagliflozin] Other (See Comments)    "shut down pancreas"   . Lasix [Furosemide] Diarrhea    Severe diarrhea  . Statins     Other reaction(s): Myalgias (muscle pain)  . Adhesive [Tape] Itching and Rash    Pt has not tried paper tape    Family History  Problem Relation Age of Onset  . Alcohol abuse Father        Died age 7  . Heart disease Father   . Aneurysm Mother        Died age 59  . Leukemia Brother        Died age 66  . COPD Sister   . Rheum arthritis Sister   . Heart failure Sister   . Dementia Maternal Uncle   . Colon cancer Neg Hx     Prior to Admission medications   Medication Sig Start Date End Date Taking? Authorizing Provider  ALPRAZolam (XANAX) 1 MG tablet TAKE 1/2 TO 1 TABLET BY MOUTH AT BEDTIME AS NEEDED FOR ANXIETY Patient taking differently: TAKE 1/2 TO 1 TABLET (0.5 - 1 MG)  BY MOUTH AT BEDTIME AS NEEDED FOR ANXIETY 05/05/16  Yes Laurey Morale, MD  enoxaparin (LOVENOX) 100 MG/ML injection Inject 0.85 mLs (85 mg total) into the skin every 12 (twelve) hours. 09/02/17  Yes Mikhail, Velta Addison, DO  fentaNYL (DURAGESIC - DOSED MCG/HR) 25 MCG/HR patch Place 1 patch (25 mcg total) onto the skin every 3 (three) days. 09/04/17  Yes Mikhail, Clinical biochemist, DO  glucagon (GLUCAGON EMERGENCY) 1 MG injection Inject 1 mg into the muscle once as needed (low blood sugar).   Yes [provider]  DULoxetine (CYMBALTA) 20 MG capsule Take 1 capsule (20 mg total) by mouth 2 (two) times daily. Once daily for 2 weeks then increase to twice daily Patient not taking: Reported on 09/02/2017 08/19/17   Sandi Mealy E., PA-C  Insulin Human (INSULIN PUMP) SOLN Inject into the skin continuous. Use with Novolog    [provider]  lidocaine-prilocaine (EMLA) cream Apply to affected area once Patient not taking: Reported on 09/02/2017 07/07/17   Heath Lark, MD  ondansetron (ZOFRAN) 8 MG tablet Take 1 tablet (8 mg total) by mouth every 8 (eight) hours as needed for refractory nausea / vomiting. Start on day 3 after chemo. Patient not taking: Reported on 09/02/2017 07/07/17   Heath Lark, MD  prochlorperazine (COMPAZINE) 10 MG tablet TAKE 1 TABLET BY MOUTH EVERY 6 HOURS AS NEEDED FOR NAUSEA OR VOMITING Patient not taking: Reported on 09/02/2017 07/07/17   Heath Lark, MD  promethazine (PHENERGAN) 25 MG tablet Take 1 tablet (25 mg total) by mouth every 6 (six) hours as needed for nausea or vomiting. Patient not taking: Reported on 09/02/2017 08/25/17   Heath Lark, MD  senna (SENOKOT) 8.6 MG TABS tablet Take 1 tablet (8.6 mg total) by mouth at bedtime. Patient not taking: Reported on 09/02/2017 07/12/17   Heath Lark, MD  SUMAtriptan (IMITREX) 100 MG tablet TAKE 1 TABLET BY MOUTH AS NEEDED FOR MIGRAINES. MAY REPEAT IN 2 HOURS IF NEEDED Patient not taking: Reported on 09/02/2017 02/05/17   Laurey Morale, MD  traMADol Veatrice [place]bon) 50 MG tablet TAKE 2 TABLETS BY MOUTH EVERY 4 HOURS AS NEEDED Patient not taking: Reported on 09/02/2017 05/06/17   Laurey Morale, MD    Physical Exam: Vitals:   09/02/17 2213 09/02/17 2243 09/03/17 0041 09/03/17 0103  BP: 132/88  Marland Kitchen)  110/53 123/69  Pulse: (!) 128  (!) 120 (!) 115  Resp: (!) 22  20 (!) 23  Temp: 98 F (36.7 C)     TempSrc: Oral     SpO2: 100%  100% 100%  Weight:  92.9 kg    Height:  _0  (1.626 m)       General: Obtunded, groaning, tachypnea with  increased expiratory effort Eyes:  Open intermittently but unfocused ENT:  dry lips & tongue,  Somewhat dry mm Neck:  no LAD, masses or thyromegaly Cardiovascular: Tachycardia, no m/r/g. Respiratory:   CTA bilaterally with no wheezes/rales/rhonchi.  Increased respiratory effort, increased expiratory effort. Abdomen:  soft, NT, ND, NABS Skin:  no rash or induration seen on limited exam Musculoskeletal:  grossly normal tone BUE/BLE,  no bony abnormality Psychiatric: obtunded to comatose Neurologic:  Unable to perform    Radiological Exams on Admission: Ct Head Wo Contrast  Result Date: 09/03/2017 CLINICAL DATA:  Encephalopathy.  History of stomach cancer. EXAM: CT HEAD WITHOUT CONTRAST TECHNIQUE: Contiguous axial images were obtained from the base of the skull through the vertex without intravenous contrast. COMPARISON:  None. FINDINGS: Brain: No intra-axial mass, edema or midline shift. No large vascular territory infarct. Mild involutional changes of the brain with minimal small vessel ischemic disease. Midline fourth ventricle and basal cisterns without effacement. Vascular: No hyperdense vessel sign or unexpected calcifications. Skull: No aggressive osseous lesions.  Fracture. Sinuses/Orbits: No acute finding. Other: None. IMPRESSION: Minimal small vessel ischemic disease a periventricular white matter. No acute intracranial abnormality. Electronically Signed   By: Ashley Royalty M.D.   On: 09/03/2017 01:28   Dg Abdomen Acute W/chest  Result Date: 09/03/2017 CLINICAL DATA:  Gastric cancer.  Abdominal pain. EXAM: DG ABDOMEN ACUTE W/ 1V CHEST COMPARISON:  CT abdomen pelvis 08/31/2017 FINDINGS: There is a right chest wall power-injectable Port-A-Cath with tip in the lower SVC via a right internal jugular vein approach. There is no evidence of dilated bowel loops or free intraperitoneal air. No radiopaque calculi or other significant radiographic abnormality is seen. Heart size and mediastinal contours  are within normal limits. Both lungs are clear. IMPRESSION: Negative abdominal radiographs.  No acute cardiopulmonary disease. Electronically Signed   By: Ulyses Jarred M.D.   On: 09/03/2017 00:47    EKG: not done   Labs on Admission: I have personally reviewed the available labs and imaging studies at the time of the admission.  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 ([place])    DKA -Patient with prior episodes of DKA that were treated at home, according to the son -Tonight, she ripped out her insulin pump and this led to the DKA; her son appears to believe that this was an intentional effort to end her suffering -Severe DKA on admission based on pH 7.009, HCO3 <7, patient with stupor/coma -Will admit to SDU with DKA protocol -Would recommend continuing insulin drip at least until morning regardless of rapidity of closure of gap and normalization of labs -K+ slightly decreased at time of presentation and so potassium supplementation added -IVF at 150 cc/hr, NS until glucose <250 and then decrease rate to 125 and change to D51/2NS  Endometrial CA -  Followed by Dr. Alvy Bimler -Seen during prior hospitalization with apparent plan to discharge home with [facility] -The patient was apparently hoping for a few lucid days to try to get her affairs in order before transitioning to comfort measures -It is not clear at this time whether she will rebound enough to return home again -For now, family requests treatment of DKA as above and plan for meeting with Dr. Alvy Bimler in the AM -Comfort care measures only were offered, but the son is not fully ready for  this -prn fentanyl for apparent pain -Will continue low-dose fentanyl patch, as well  Large IVC thrombus -Continue treatment-dose Lovenox  -Thought to be secondary to cancer recurrence  B hydronephrosis due to ureteral obstruction from tumor recurrence -Creatinine increased - due to DKA but also possibly related -Patient declined B nephrostomy tube placement prior  Goals of care -After prolonged telephonic discussion with son, for now we will aggressively treat DKA overnight but also provide pain control -Family meeting with Dr. Alvy Bimler tomorrow -Patient is DNR  DVT prophylaxis: Lovenox  Code Status:  DNR - confirmed with family Family Communication: Spoke with son by telephone  Disposition Plan: Likely to have an in-hospital death Consults called: Oncology - added to treatment team Admission status: Admit - It is my clinical opinion that admission to INPATIENT is reasonable and necessary because of the expectation that this patient will require hospital care that crosses at least 2 midnights to treat this condition based on the medical complexity of the problems presented.  Given the aforementioned information, the predictability of an adverse outcome is felt to be significant.    Karmen Bongo MD [facility]  If note is complete, please contact covering daytime or nighttime physician. [email or web address] Password [email or web address]  09/03/2017, 1:38 AM

## 2017-09-03 NOTE — Progress Notes (Signed)
Katie Palmer is a 67 y.o. female with medical history significant of endometrial cancer, most recent chemotherapy 3 weeks ago with side effects including uncontrolled nausea, vomiting, dehydration. Chemotherapy has been on hold. Also with past medical history of diabetes, anxiety and depression, essential hypertension.  She was admitted from 8/4 until today and was discharged home but had refractory pain and so returned.  09/03/2017: Patient seen and examined with her son and friend at her bedside.  She is somnolent however easily arousable to voices and follows commands.  Does not appear to be in acute distress.  Palliative care team consulted and following.  Dr. Alvy Bimler saw the patient and spoke with her son who is also medical power of attorney.He felt when he is ready he will transition her care to comfort measures.  Please refer to H&P dictated by Dr. Lorin Mercy on 09/03/2017 for further details of the assessment and plan.

## 2017-09-03 NOTE — Consult Note (Signed)
Consultation Note Date: 09/03/2017   Patient Name: Katie Palmer  DOB: [birth year]  MRN: [id number]  Age / Sex: 67 y.o., female  PCP: Laurey Morale, MD Referring Physician: Kayleen Memos, DO  Reason for Consultation: Establishing goals of care  HPI/Patient Profile: 67 y.o. female  with past medical history of endometrial cancer with carcinomatosis (tumor blocking ureters, bowel, s/p chemo, not candidate for further treatment determined on last admission), DM (on insulin pump), anxiety, depression, HTN  admitted on 09/02/2017 with DKA and refractory pain. Palliative medicine consulted for [place].    Clinical Assessment and Goals of Care: Palliative medicine met with patient and family on 8/7 and at that time they were resigned to the fact patient had end stage cancer. GOC at that time were for patient to go home with home health and declined Hospice due to the fact she desired to continue to receive IV fluids in her hopes this would continue to giver her strength to complete some financial paperwork and goals that she had.   I assessed patient and she was resting comfortably. A friend was at bedside. Per nursing and friend she had become very agitated earlier with significant urge incontinence. However, once she was able to void she was calmer. She was able to get up to the bedside toilet.  Called patient's son for continued [place] discussion. He was very upset, stated that he felt his mother had been discharged too early. That she was in pain when she was discharged and did not have pain medication when she was discharged. He was under the impression that she was not going to be discharged until today when he received a call yesterday stating she was being discharged.  He stated that he believes his mother was in so much pain that she intentionally pulled out her insulin pump in order to end her life.  He states he is  aware that his Mom may not be able to return home or recover to the mental state that she was in yesterday.  His GOC for the next 24 hours are for him to get some rest so that he can have a clearer mind to make decisions about her plan of care. He would also like her current level of care to continue, but also for her to be comfortable and not in pain.    Primary Decision Maker NEXT OF KIN- patient's on- [place] -Continue current care -Foley catheter for comfort -Oxybutynin syrup '5mg'$  BID for bladder spasms r/t carcinamatosis -Lorazapam '1mg'$  SL or IV for agitation or anxiety -Continue Fentanyl Patch 4mg -Continue Fentanyl IV 272m q1hr PRN  -PMT will contact patient's son tomorrow to continue GOBayou Corneiscussion     Code Status/Advance Care Planning:  DNR  Palliative Prophylaxis:   Frequent Pain Assessment  Prognosis:    Unable to determine  Discharge Planning: To Be Determined  Primary Diagnoses: Present on Admission: . D[place] (diabetic [place]acidosis) ([place]. [place]rial [place]. 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 The following aspects are pertinent.  Past Medical History:  Diagnosis Date  . Depression   . Fibromyalgia   . Headache(784.0)   . History of colon polyps   . History of diabetes with ketoacidosis   . Hx of colonic polyps   . Hypertension   . Insomnia   . Insulin pump in place   . RBBB (right bundle branch block)   . RLQ abdominal pain   . Thickened endometrium   . Type 1 diabetes mellitus with long-term current use of insulin [facility]) endocrinologist-- dr Chalmers Cater   July 2015--  pt took Invokana and caused complete pancreas shut-down , pt went from pre-diabetic to adult type 1 dm now insulin dependent  . Vertigo    Social History    Socioeconomic History  . Marital status: Divorced    Spouse name: Not on file  . Number of children: 1  . Years of education: Not on file  . Highest education level: Not on file  Occupational History  . 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 Financial resource strain: Not on file  . Food insecurity:    Worry: Not on file    Inability: Not on file  . Transportation needs:    Medical: Not on file    Non-medical: Not on file  Tobacco Use  . Smoking status: Never Smoker  . Smokeless tobacco: Never Used  Substance and Sexual Activity  . Alcohol use: No    Alcohol/week: 0.0 standard drinks  . Drug use: No  . Sexual activity: Not Currently    Birth control/protection: Post-menopausal  Lifestyle  . Physical activity:    Days per week: Not on file    Minutes per session: Not on file  . Stress: Not on file  Relationships  . Social connections:    Talks on phone: Not on file    Gets together: Not on file    Attends religious service: Not on file    Active member of club or organization: Not on file    Attends meetings of clubs or organizations: Not on file    Relationship status: Not on file  Other Topics Concern  . Not on file  Social History Narrative   Divorced.  Son lives with her.  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   Family History  Problem Relation Age of Onset  . Alcohol abuse Father        Died age 64  . Heart disease Father   . Aneurysm Mother        Died age 57  . Leukemia Brother        Died age 71  . COPD Sister   . Rheum arthritis Sister   . Heart failure Sister   . Dementia Maternal Uncle   . Colon cancer Neg Hx    Scheduled Meds: . enoxaparin  1 mg/kg Subcutaneous Q12H  . feeding supplement  1 Container Oral TID BM  . fentaNYL  25 mcg Transdermal Q72H  . multivitamin with minerals  1 tablet Oral Daily   Continuous Infusions: . sodium chloride Stopped (09/03/17 0506)  . sodium chloride 10 mL/hr at 09/03/17  1235  . dextrose 5 % and 0.45% NaCl 125 mL/hr at 09/03/17 0600  . insulin (NOVOLIN-R) infusion  3.6 Units/hr (09/03/17 1043)  . magnesium sulfate 1 - 4 g bolus IVPB 2 g (09/03/17 1232)  . potassium chloride 10 mEq (09/03/17 1237)   PRN Meds:.fentaNYL (SUBLIMAZE) injection, ondansetron Medications Prior to Admission:  Prior to Admission medications   Medication Sig Start Date End Date Taking? Authorizing Provider  ALPRAZolam (XANAX) 1 MG tablet TAKE 1/2 TO 1 TABLET BY MOUTH AT BEDTIME AS NEEDED FOR ANXIETY Patient taking differently: TAKE 1/2 TO 1 TABLET (0.5 - 1 MG)  BY MOUTH AT BEDTIME AS NEEDED FOR ANXIETY 05/05/16  Yes Laurey Morale, MD  enoxaparin (LOVENOX) 100 MG/ML injection Inject 0.85 mLs (85 mg total) into the skin every 12 (twelve) hours. 09/02/17  Yes Mikhail, Velta Addison, DO  fentaNYL (DURAGESIC - DOSED MCG/HR) 25 MCG/HR patch Place 1 patch (25 mcg total) onto the skin every 3 (three) days. 09/04/17  Yes Mikhail, Clinical biochemist, DO  glucagon (GLUCAGON EMERGENCY) 1 MG injection Inject 1 mg into the muscle once as needed (low blood sugar).   Yes [provider]  Insulin Human (INSULIN PUMP) SOLN Inject into the skin continuous. Use with Novolog    [provider]  lidocaine-prilocaine (EMLA) cream Apply to affected area once Patient not taking: Reported on 09/02/2017 07/07/17   Heath Lark, MD  ondansetron (ZOFRAN) 8 MG tablet Take 1 tablet (8 mg total) by mouth every 8 (eight) hours as needed for refractory nausea / vomiting. Start on day 3 after chemo. Patient not taking: Reported on 09/02/2017 07/07/17   Heath Lark, MD  promethazine (PHENERGAN) 25 MG tablet Take 1 tablet (25 mg total) by mouth every 6 (six) hours as needed for nausea or vomiting. Patient not taking: Reported on 09/02/2017 08/25/17   Heath Lark, MD   Allergies  Allergen Reactions  . Armodafinil Other (See Comments)    Flu-like symptoms  . Invokana [Canagliflozin] Other (See Comments)    "shut down pancreas"   .  Lasix [Furosemide] Diarrhea    Severe diarrhea  . Statins     Other reaction(s): Myalgias (muscle pain)  . Adhesive [Tape] Itching and Rash    Pt has not tried paper tape   Review of Systems  Unable to perform ROS: Mental status change    Physical Exam  Constitutional: She appears well-developed and well-nourished.  Cardiovascular: Normal rate and regular rhythm.  Pulmonary/Chest: Effort normal and breath sounds normal.  Neurological:  Lethargic, does not arouse to my voice or light touch  Skin: Skin is warm and dry. There is pallor.  Nursing note and vitals reviewed.   Vital Signs: BP 130/80 (BP Location: Left Arm)   Pulse 91   Temp 98.5 F (36.9 C) (Oral)   Resp 17   Ht '5\' 4"'$  (1.626 m)   Wt 92.9 kg   SpO2 100%   BMI 35.16 kg/m  Pain Scale: CPOT   Pain Score: Asleep   SpO2: SpO2: 100 % O2 Device:SpO2: 100 % O2 Flow Rate: .   IO: Intake/output summary:   Intake/Output Summary (Last 24 hours) at 09/03/2017 1251 Last data filed at 09/03/2017 0800 Gross per 24 hour  Intake 2330.31 ml  Output -  Net 2330.31 ml    LBM:   Baseline Weight: Weight: 92.9 kg Most recent weight: Weight: 92.9 kg     Palliative Assessment/Data: PPS: 30%     Thank you for this consult. Palliative medicine will continue to follow and assist as needed.   Time In: 1130 Time Out: 1345 Time Total: 75 minutes Greater  than 50%  of this time was spent counseling and coordinating care related to the above assessment and plan.  Signed by: Mariana Kaufman, AGNP-C Palliative Medicine    Please contact Palliative Medicine Team phone at [phone] for questions and concerns.  For individual provider: See Shea Evans

## 2017-09-03 NOTE — Progress Notes (Signed)
Initial Nutrition Assessment  DOCUMENTATION CODES:   Obesity unspecified  INTERVENTION:  - Diet advancement as medically feasible.  - Will order Boost Breeze TID for once diet advanced to at least CLD, each supplement provides 250 kcal and 9 grams of protein. - Will order daily multivitamin.   NUTRITION DIAGNOSIS:   Inadequate oral intake related to inability to eat, nausea, vomiting, acute illness as evidenced by NPO status, per patient/family report.  GOAL:   Patient will meet greater than or equal to 90% of their needs  MONITOR:   Diet advancement, Weight trends, Labs, I & O's  REASON FOR ASSESSMENT:   Malnutrition Screening Tool  ASSESSMENT:   67 y.o. female with medical history significant of endometrial cancer, most recent chemotherapy 3 weeks ago with side effects including uncontrolled nausea, vomiting, dehydration. Chemotherapy has been on hold.  Also with past medical history of diabetes, anxiety and depression, essential hypertension.  She was admitted from 8/4 until 8/8 and was discharged home on 8/8 but had refractory pain and so returned.  Patient has been NPO since admission. She is sleeping soundly, snoring at this time. Friend of 55 years at bedside. She reports that on Sunday (8/4) patient was only drinking water. She saw patient on Monday (8/5) and there were several cartons/cups of liquid on bedside table and patient denied drinking any of it. Friend saw patient again last night after patient had returned home and patient did not consume anything PO, to include water, from that time until returning to the ED.   Patient was seen by RD on 8/5. Flow sheet from that time through d/c yesterday limited in documentation of intakes with only two meal times documented on and both showed 0% intake of meal.   RD note on 8/5 stated that patient was d/c with severe malnutrition as an outpatient based on intakes and weight loss. NFPE able to be completed on 8/5 and mild fat  wasting to temple found but no other fat wasting and no muscle wasting noted. During that visit, patient had reported ongoing N/V and that she had been unable to eat d/t this, not drinking nutrition supplements d/t N/V. Patient was agreeable to [facility] order at that time and did not want anything milky.   Per chart review, patient has lost 9 lbs (4% body weight since May 2019. Will monitor weight trends closely and re-assess this piece at follow-up. Unable to state malnutrition based on ASPEN guidelines at this time.    Medications reviewed; insulin drip, 2 g IV Mg sulfate x1 run today, 10 mEq IV KCl x4 runs today. Labs reviewed; CBGs: 154-447 mg/dL since midnight, Cl: 115 mmol/L, BUN: 7 mg/dL, creatinine: 1.29 mg/dL, Ca: 8.4 mg/dL, GFR: 42 mL.min.  IVF: D5-1/2 NS @ 125 mL/hr (510 kcal) previously, now receiving NS @ 150 mL/hr.       NUTRITION - FOCUSED PHYSICAL EXAM:  Unable to complete as patient sleeping soundly; will complete at follow-up.   Diet Order:   Diet Order            Diet NPO time specified  Diet effective now              EDUCATION NEEDS:   Not appropriate for education at this time  Skin:  Skin Assessment: Reviewed RN Assessment  Last BM:  Prior to this admission, flowsheet documents BM on 8/3  Height:   Ht Readings from Last 1 Encounters:  09/02/17 5\' 4"  (1.626 m)    Weight:  Wt Readings from Last 1 Encounters:  09/02/17 92.9 kg    Ideal Body Weight:  54.54 kg  BMI:  Body mass index is 35.16 kg/m.  Estimated Nutritional Needs:   Kcal:  2140-2320   Protein:  110-120 grams  Fluid:  >/= 2.2 L/day     Jarome Matin, MS, RD, LDN, [facility] Inpatient Clinical Dietitian Pager # [phone] After hours/weekend pager # [phone]

## 2017-09-03 NOTE — Progress Notes (Signed)
Inpatient Diabetes Program Recommendations  AACE/ADA: New Consensus Statement on Inpatient Glycemic Control (2015)  Target Ranges:  Prepandial:   less than 140 mg/dL      Peak postprandial:   less than 180 mg/dL (1-2 hours)      Critically ill patients:  140 - 180 mg/dL   Lab Results  Component Value Date   GLUCAP 184 (H) 09/03/2017   HGBA1C 7.3 (H) 04/08/2017    Review of Glycemic Control  Insulin pump disconnected at home and will not be restarted. In DKA on admission and transitioned off insulin drip. Pt had good glycemic control and managed insulin pump prior to this admission.  Inpatient Diabetes Program Recommendations:     Change Novolog to 0-9 units Q4H D/C meal coverage insulin   Continue to follow.  Thank you. Lorenda Peck, RD, LDN, CDE Inpatient Diabetes Coordinator [phone]

## 2017-09-04 DIAGNOSIS — Z7189 Other specified counseling: Secondary | ICD-10-CM

## 2017-09-04 DIAGNOSIS — E1011 Type 1 diabetes mellitus with ketoacidosis with coma: Principal | ICD-10-CM

## 2017-09-04 DIAGNOSIS — C800 Disseminated malignant neoplasm, unspecified: Secondary | ICD-10-CM

## 2017-09-04 LAB — BASIC METABOLIC PANEL
ANION GAP: 7 (ref 5–15)
BUN: 9 mg/dL (ref 8–23)
CALCIUM: 8.1 mg/dL — AB (ref 8.9–10.3)
CHLORIDE: 114 mmol/L — AB (ref 98–111)
CO2: 22 mmol/L (ref 22–32)
CREATININE: 0.96 mg/dL (ref 0.44–1.00)
GFR calc non Af Amer: 59 mL/min — ABNORMAL LOW (ref >60)
GLUCOSE: 131 mg/dL — AB (ref 70–99)
Potassium: 2.9 mmol/L — ABNORMAL LOW (ref 3.5–5.1)
Sodium: 143 mmol/L (ref 135–145)

## 2017-09-04 LAB — COMPREHENSIVE METABOLIC PANEL
ALBUMIN: 2.2 g/dL — AB (ref 3.5–5.0)
ALT: 8 U/L (ref 0–44)
AST: 13 U/L — ABNORMAL LOW (ref 15–41)
Alkaline Phosphatase: 80 U/L (ref 38–126)
Anion gap: 12 (ref 5–15)
BILIRUBIN TOTAL: 1.1 mg/dL (ref 0.3–1.2)
BUN: 8 mg/dL (ref 8–23)
CO2: 15 mmol/L — ABNORMAL LOW (ref 22–32)
CREATININE: 1.11 mg/dL — AB (ref 0.44–1.00)
Calcium: 8.1 mg/dL — ABNORMAL LOW (ref 8.9–10.3)
Chloride: 109 mmol/L (ref 98–111)
GFR calc Af Amer: 58 mL/min — ABNORMAL LOW (ref >60)
GFR, EST NON AFRICAN AMERICAN: 50 mL/min — AB (ref >60)
GLUCOSE: 440 mg/dL — AB (ref 70–99)
Potassium: 3.7 mmol/L (ref 3.5–5.1)
Sodium: 136 mmol/L (ref 135–145)
TOTAL PROTEIN: 5.5 g/dL — AB (ref 6.5–8.1)

## 2017-09-04 LAB — GLUCOSE, CAPILLARY
GLUCOSE-CAPILLARY: 422 mg/dL — AB (ref 70–99)
GLUCOSE-CAPILLARY: 414 mg/dL — AB (ref 70–99)
GLUCOSE-CAPILLARY: 381 mg/dL — AB (ref 70–99)
Glucose-Capillary: 305 mg/dL — ABNORMAL HIGH (ref 70–99)
Glucose-Capillary: 123 mg/dL — ABNORMAL HIGH (ref 70–99)
Glucose-Capillary: 92 mg/dL (ref 70–99)

## 2017-09-04 LAB — HEMOGLOBIN A1C
Hgb A1c MFr Bld: 7.1 % — ABNORMAL HIGH (ref 4.8–5.6)
Mean Plasma Glucose: 157.07 mg/dL

## 2017-09-04 LAB — CBC
HEMATOCRIT: 23.4 % — AB (ref 36.0–46.0)
HEMOGLOBIN: 7.8 g/dL — AB (ref 12.0–15.0)
MCH: 30.4 pg (ref 26.0–34.0)
MCHC: 33.3 g/dL (ref 30.0–36.0)
MCV: 91.1 fL (ref 78.0–100.0)
Platelets: 244 10*3/uL (ref 150–400)
RBC: 2.57 MIL/uL — AB (ref 3.87–5.11)
RDW: 22.5 % — ABNORMAL HIGH (ref 11.5–15.5)
WBC: 5.1 10*3/uL (ref 4.0–10.5)

## 2017-09-04 LAB — MAGNESIUM: MAGNESIUM: 2.1 mg/dL (ref 1.7–2.4)

## 2017-09-04 LAB — PHOSPHORUS: Phosphorus: 1.2 mg/dL — ABNORMAL LOW (ref 2.5–4.6)

## 2017-09-04 MED ORDER — SODIUM PHOSPHATES 45 MMOLE/15ML IV SOLN
30.0000 mmol | Freq: Once | INTRAVENOUS | Status: AC
  Administered 2017-09-04: 30 mmol via INTRAVENOUS
  Filled 2017-09-04: qty 10

## 2017-09-04 MED ORDER — SODIUM PHOSPHATES 45 MMOLE/15ML IV SOLN: 30.0000 mmol | Freq: Once | INTRAVENOUS | Status: DC

## 2017-09-04 MED ORDER — INSULIN GLARGINE 100 UNIT/ML ~~LOC~~ SOLN
10.0000 [IU] | Freq: Once | SUBCUTANEOUS | Status: AC
  Administered 2017-09-04: 10 [IU] via SUBCUTANEOUS
  Filled 2017-09-04: qty 0.1

## 2017-09-04 MED ORDER — POTASSIUM CHLORIDE 10 MEQ/100ML IV SOLN
10.0000 meq | INTRAVENOUS | Status: AC
  Administered 2017-09-04 (×6): 10 meq via INTRAVENOUS
  Filled 2017-09-04 (×5): qty 100

## 2017-09-04 MED ORDER — INSULIN GLARGINE 100 UNIT/ML ~~LOC~~ SOLN
20.0000 [IU] | Freq: Every day | SUBCUTANEOUS | Status: DC
  Administered 2017-09-04: 20 [IU] via SUBCUTANEOUS
  Filled 2017-09-04: qty 0.2

## 2017-09-04 MED ORDER — POTASSIUM CHLORIDE CRYS ER 20 MEQ PO TBCR
40.0000 meq | EXTENDED_RELEASE_TABLET | Freq: Three times a day (TID) | ORAL | Status: DC
  Filled 2017-09-04: qty 2

## 2017-09-04 MED ORDER — INSULIN ASPART 100 UNIT/ML ~~LOC~~ SOLN
0.0000 [IU] | SUBCUTANEOUS | Status: DC
  Administered 2017-09-04: 11 [IU] via SUBCUTANEOUS
  Administered 2017-09-04: 20 [IU] via SUBCUTANEOUS
  Administered 2017-09-04: 2 [IU] via SUBCUTANEOUS
  Administered 2017-09-05: 3 [IU] via SUBCUTANEOUS
  Administered 2017-09-05 (×2): 2 [IU] via SUBCUTANEOUS
  Administered 2017-09-06: 5 [IU] via SUBCUTANEOUS
  Administered 2017-09-06: 3 [IU] via SUBCUTANEOUS
  Administered 2017-09-06: 5 [IU] via SUBCUTANEOUS
  Administered 2017-09-06: 3 [IU] via SUBCUTANEOUS

## 2017-09-04 MED ORDER — INSULIN ASPART 100 UNIT/ML ~~LOC~~ SOLN
10.0000 [IU] | Freq: Once | SUBCUTANEOUS | Status: AC
  Administered 2017-09-04: 10 [IU] via SUBCUTANEOUS

## 2017-09-04 NOTE — Progress Notes (Signed)
CRITICAL VALUE ALERT  Critical Value:   cbg is am is 422  Date & Time Notied: 09/04/2017    Provider Notified: 6:44 AM   Orders Received/Actions taken: None

## 2017-09-04 NOTE — Progress Notes (Addendum)
PROGRESS NOTE  Katie Palmer ASN:[id number] DOB: 02-Jan-1951 DOA: 09/02/2017 PCP: Laurey Morale, MD  HPI/Recap of past 24 hours:  [name] a 67 y.o.femalewith medical history significant ofendometrial cancer, most recent chemotherapy 3 weeks ago with side effects including uncontrolled nausea, vomiting, dehydration. Chemotherapy has been on hold. Also with past medical history of diabetes, anxiety and depression, essential hypertension.She was admitted from 8/4 until today and was discharged home but had refractory pain and so returned.  09/03/2017: Patient seen and examined with her son and friend at her bedside.  She is somnolent however easily arousable to voices and follows commands.  Does not appear to be in acute distress.  Palliative care team consulted and following.  Dr. Alvy Bimler saw the patient and spoke with her son who is also medical power of attorney.He felt when he is ready he will transition her care to comfort measures.  09/04/2017: Patient seen and examined at her bedside.  She is somnolent however she is easily arousable to voices.  She nods no when asked if she is in pain or having any discomfort.  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 ([facility])   Carcinomatosis ([facility])   Palliative care by specialist   Advance care planning  Type I DKA secondary to insulin noncompliance Patient intentionally removed her insulin pump at home Continue Lantus, NovoLog, and ISS Hemoglobin A1c 7.1 on 09/04/2017 Repeat BMP this afternoon Patient has minimal oral intake Therefore continue insulin sliding scale every 4 hours with close Accu-Cheks Continue to closely monitor in stepdown unit  Metabolic acidosis most likely secondary to DKA Continue IV fluid Continue treatment for  DKA Repeat BMP this afternoon  Hypophosphatemia Phosphorus 1.2 Repleted with IV phosphorus supplement  Severe protein calorie malnutrition Albumin 2.2 Poor oral intake presently  CKD 3 Appears to be at her baseline with creatinine of 1.1 and GFR of 50 Avoid nephrotoxic agents/dehydration/hypotension Continue to monitor urine output Repeat BMP   Endometrial cancer, terminal per oncology Did not tolerate chemotherapy well-Not a candidate for further chemotherapy Dr Lurlean Leyden following and has addressed goals of care with family Palliative care following and possible meeting with family  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 Continue full dose Lovenox  Bilateral hydronephrosis secondary to ureteral obstruction from tumor recurrence      Code Status: DNR  Family Communication: Son and friend at bedside on 09/03/2017  Disposition Plan: Home versus SNF versus residential hospice   Consultants:  Oncology  Palliative care team  Procedures:  None  Antimicrobials:  None  DVT prophylaxis: Lovenox   Objective: Vitals:   09/04/17 0600 09/04/17 0800 09/04/17 0900 09/04/17 1200  BP: (!) 157/76 (!) 178/92 (!) 160/93   Pulse:  75    Resp: 20 (!) 23 18   Temp:  98.5 F (36.9 C)  99.1 F (37.3 C)  TempSrc:  Axillary  Oral  SpO2: 97% 98% 97%   Weight:      Height:        Intake/Output Summary (Last 24 hours) at 09/04/2017 1331 Last data filed at 09/04/2017 1230 Gross per 24 hour  Intake 1509.31 ml  Output 2150 ml  Net -640.69 ml   Filed Weights   09/02/17 2243  Weight: 92.9 kg    Exam:  . General: 67 y.o. year-old female well  developed well nourished in no acute distress.  Somnolent but easily arousable to voices.  Follows commands. . Cardiovascular: Regular rate and rhythm with no rubs or gallops.  No thyromegaly or JVD noted.   Marland Kitchen Respiratory: Clear to auscultation with no wheezes or rales. Good inspiratory effort. . Abdomen: Soft nontender  nondistended with normal bowel sounds x4 quadrants. . Musculoskeletal: Trace edema lower extremities bilaterally. 2/4 pulses in all 4 extremities. Marland Kitchen Psychiatry: Mood is appropriate for condition and setting   Data Reviewed: CBC: Recent Labs  Lab 08/29/17 1622  09/01/17 0555 09/02/17 0421 09/02/17 2331 09/03/17 0648 09/04/17 0332  WBC 4.9   < > 7.1 5.3 12.1* 7.3 5.1  NEUTROABS 3.8  --   --   --  8.9  --   --   HGB 9.0*   < > 9.2* 8.4* 9.2* 7.4* 7.8*  HCT 26.8*   < > 28.5* 25.4* 30.2* 22.8* 23.4*  MCV 89.0   < > 93.1 90.7 97.4 92.3 91.1  PLT 274   < > 308 268 426* 238 244   < > = values in this interval not displayed.   Basic Metabolic Panel: Recent Labs  Lab 08/29/17 1618  09/01/17 0555  09/03/17 0617 09/03/17 1019 09/03/17 1456 09/03/17 1805 09/04/17 0332  NA  --    < > 138   < > 143 141 142 139 136  K  --    < > 4.6   < > 4.0 3.2* 3.6 3.8 3.7  CL  --    < > 105   < > 115* 117* 117* 114* 109  CO2  --    < > 14*   < > 14* 17* 20* 16* 15*  GLUCOSE  --    < > 314*   < > 219* 200* 144* 296* 440*  BUN  --    < > 7*   < > 7* 7* 8 8 8   CREATININE  --    < > 1.18*   < > 1.29* 1.09* 1.03* 1.00 1.11*  CALCIUM  --    < > 8.5*   < > 8.4* 8.2* 8.5* 8.0* 8.1*  MG 1.7  --  1.7  --   --   --   --   --  2.1  PHOS  --   --   --   --   --   --   --   --  1.2*   < > = values in this interval not displayed.   GFR: Estimated Creatinine Clearance: 53.6 mL/min (A) (by C-G formula based on SCr of 1.11 mg/dL (H)). Liver Function Tests: Recent Labs  Lab 08/29/17 1622 09/02/17 2331 09/04/17 0332  AST 12* 11* 13*  ALT 8 8 8   ALKPHOS 74 99 80  BILITOT 1.0 1.8* 1.1  PROT 6.0* 6.8 5.5*  ALBUMIN 2.5* 2.9* 2.2*   Recent Labs  Lab 08/29/17 1622 09/02/17 2331  LIPASE 18 17   No results for input(s): AMMONIA in the last 168 hours. Coagulation Profile: No results for input(s): INR, PROTIME in the last 168 hours. Cardiac Enzymes: No results for input(s): CKTOTAL, CKMB, CKMBINDEX,  TROPONINI in the last 168 hours. BNP (last 3 results) No results for input(s): PROBNP in the last 8760 hours. HbA1C: Recent Labs    09/04/17 0332  HGBA1C 7.1*   CBG: Recent Labs  Lab 09/03/17 2228 09/04/17 0620 09/04/17 0745 09/04/17 1005 09/04/17 1150  GLUCAP 139* 422* 414* 381* 305*  Lipid Profile: No results for input(s): CHOL, HDL, LDLCALC, TRIG, CHOLHDL, LDLDIRECT in the last 72 hours. Thyroid Function Tests: No results for input(s): TSH, T4TOTAL, FREET4, T3FREE, THYROIDAB in the last 72 hours. Anemia Panel: No results for input(s): VITAMINB12, FOLATE, FERRITIN, TIBC, IRON, RETICCTPCT in the last 72 hours. Urine analysis:    Component Value Date/Time   COLORURINE YELLOW 08/30/2017 [place] 08/30/2017 0713   LABSPEC 1.008 08/30/2017 0713   PHURINE 6.0 08/30/2017 0713   GLUCOSEU NEGATIVE 08/30/2017 0713   HGBUR NEGATIVE 08/30/2017 0713   HGBUR negative 02/06/2009 0915   BILIRUBINUR NEGATIVE 08/30/2017 0713   BILIRUBINUR 1+ 10/29/2016 1203   KETONESUR 20 (A) 08/30/2017 0713   PROTEINUR NEGATIVE 08/30/2017 0713   UROBILINOGEN 0.2 10/29/2016 1203   UROBILINOGEN 0.2 05/20/2014 0809   NITRITE NEGATIVE 08/30/2017 0713   LEUKOCYTESUR NEGATIVE 08/30/2017 0713   Sepsis Labs: @LABRCNTIP (procalcitonin:4,lacticidven:4)  ) Recent Results (from the past 240 hour(s))  Culture, blood (routine x 2)     Status: None   Collection Time: 08/29/17  4:22 PM  Result Value Ref Range Status   Specimen Description   Final    BLOOD PORTA CATH Performed at [facility], [place] [address], [place], [place]  [address]    Special Requests   Final    BOTTLES DRAWN AEROBIC AND ANAEROBIC Blood Culture adequate volume Performed at [facility] [address] [place], [place] [address]    Culture   Final    NO GROWTH 5 DAYS Performed at [facility], [place] [address], [place], [place] [address]    Report Status 09/03/2017 FINAL   Final  Culture, blood (routine x 2)     Status: None   Collection Time: 08/29/17  4:22 PM  Result Value Ref Range Status   Specimen Description   Final    BLOOD BLOOD LEFT ARM Performed at [facility] [address] [place], [place] [address]    Special Requests   Final    BOTTLES DRAWN AEROBIC AND ANAEROBIC Blood Culture results may not be optimal due to an excessive volume of blood received in culture bottles Performed at [facility] [address] [place], [place] [address]    Culture   Final    NO GROWTH 5 DAYS Performed at [facility], [place] [address], [place], [place] [address]    Report Status 09/03/2017 FINAL  Final  Urine culture     Status: None   Collection Time: 08/30/17  7:13 AM  Result Value Ref Range Status   Specimen Description   Final    URINE, CATHETERIZED Performed at [facility] [address], [place], [place] [address]    Special Requests   Final    Normal Performed at [facility], [place] [address], [place], [place] [address]    Culture   Final    NO GROWTH Performed at [facility], [place] [address] [place],  [address]    Report Status 08/31/2017 FINAL  Final  MRSA PCR Screening     Status: None   Collection Time: 09/03/17  3:42 AM  Result Value Ref Range Status   MRSA by PCR NEGATIVE NEGATIVE Final    Comment:        The GeneXpert MRSA Assay (FDA approved for NASAL specimens only), is one component of a comprehensive MRSA colonization surveillance program. It is not intended to diagnose MRSA infection  nor to guide or monitor treatment for MRSA infections. Performed at [facility], [place] [address], [place],  [address]       Studies: No results found.  Scheduled Meds: . enoxaparin  1 mg/kg Subcutaneous Q12H  . feeding supplement  1 Container Oral TID BM  . fentaNYL  25 mcg Transdermal Q72H  . insulin aspart   0-15 Units Subcutaneous Q4H  . insulin glargine  20 Units Subcutaneous QHS  . mouth rinse  15 mL Mouth Rinse BID  . oxybutynin  5 mg Oral BID    Continuous Infusions: . sodium chloride 100 mL/hr at 09/04/17 1200  . sodium chloride 10 mL/hr at 09/04/17 1018     LOS: 1 day     Kayleen Memos, MD [facility] Pager [phone]  If 7PM-7AM, please contact night-coverage [email or web address] Password [email or web address] 09/04/2017, 1:31 PM

## 2017-09-04 NOTE — Progress Notes (Signed)
Paged MD Nevada Crane about the Pt's CBG 422, made her aware, orders receive Abbott Pao will resume care at this time.

## 2017-09-04 NOTE — Progress Notes (Signed)
Daily Progress Note   Patient Name: Katie Palmer       Date: 09/04/2017 DOB: [birth year]  Age: 67 y.o. MRN#: [id number] Attending Physician: Kayleen Memos, DO Primary Care Physician: Laurey Morale, MD Admit Date: 09/02/2017  Reason for Consultation/Follow-up: Establishing goals of care  Subjective: Patient is reportedly more comfortable today. Still remains lethargic. Son and friend are at bedside.   Length of Stay: 1  Current Medications: Scheduled Meds:  . enoxaparin  1 mg/kg Subcutaneous Q12H  . feeding supplement  1 Container Oral TID BM  . fentaNYL  25 mcg Transdermal Q72H  . insulin aspart  0-15 Units Subcutaneous Q4H  . insulin glargine  20 Units Subcutaneous QHS  . mouth rinse  15 mL Mouth Rinse BID  . oxybutynin  5 mg Oral BID    Continuous Infusions: . sodium chloride 100 mL/hr at 09/04/17 1300  . sodium chloride 10 mL/hr at 09/04/17 1018    PRN Meds: fentaNYL (SUBLIMAZE) injection, LORazepam **OR** LORazepam **OR** LORazepam, ondansetron  Physical Exam  Constitutional:  Frail appearing, ill appearing  Cardiovascular: Normal rate and regular rhythm.  Pulmonary/Chest: Effort normal and breath sounds normal.  Neurological:  Lethargic, wakes with stimuli, confused  Nursing note and vitals reviewed.           Vital Signs: BP (!) 154/95   Pulse 75   Temp 99.1 F (37.3 C) (Oral)   Resp 18   Ht _0  (1.626 m)   Wt 92.9 kg   SpO2 97%   BMI 35.16 kg/m  SpO2: SpO2: 97 % O2 Device: O2 Device: Room Air O2 Flow Rate:    Intake/output summary:   Intake/Output Summary (Last 24 hours) at 09/04/2017 1350 Last data filed at 09/04/2017 1300 Gross per 24 hour  Intake 1609.24 ml  Output 2150 ml  Net -540.76 ml   LBM: Last BM Date: 08/28/17 Baseline Weight:  Weight: 92.9 kg Most recent weight: Weight: 92.9 kg       Palliative Assessment/Data:      Patient Active Problem List   Diagnosis Date Noted  . DKA (diabetic ketoacidosis) ([place]) 09/03/2017  . 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 Palliative care  by specialist   . Advance care planning   . Protein-calorie malnutrition, severe 08/30/2017  . Nausea with vomiting 08/26/2017  . Drug-induced hypotension 07/30/2017  . Generalized weakness 07/27/2017  . Elevated serum creatinine 07/07/2017  . Generalized anxiety disorder 07/07/2017  . Other constipation 07/07/2017  . Acute leg pain, left 04/29/2017  . Left leg pain 04/29/2017  . Hearing loss in right ear 04/16/2017  . Obesity, morbid ([place]) 04/15/2017  . Endometrial cancer ([place]) 04/15/2017  . Endometrial adenocarcinoma ([place]) 03/23/2017  . Vertigo 10/29/2016  . Memory loss 10/29/2016  . Posterior tibial tendinitis, right leg 10/07/2016  . Vitamin D deficiency 01/07/2016  . Hypertension 06/03/2015  . 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 DKA, type 1 ([place]) 05/20/2014  . Depression 05/20/2014  . AKI (acute kidney injury) ([place]) 05/20/2014  . Dyslipidemia 04/12/2014  . Leukocytosis 07/28/2013  . Dehydration 07/28/2013  . Fibromyalgia 05/27/2011  . Essential hypertension 12/15/2006  . INSOMNIA 12/15/2006  . HEADACHE 12/15/2006  . CHEST PAIN 12/15/2006  . COLONIC POLYPS, HX OF 12/15/2006    Palliative Care Assessment & Plan   Patient Profile: 67 y.o. female  with past medical history of endometrial cancer with carcinomatosis (tumor blocking ureters, bowel, s/p chemo, not candidate for further treatment determined on last admission), DM (on insulin pump), anxiety, depression, HTN  admitted on 09/02/2017 with DKA and refractory pain. Palliative medicine  consulted for [place].    Assessment: I met with patient's son to discuss goals. He feels that patient is more comfortable today. He confirms speaking with Dr. Alvy Bimler and understands that there are no plans for future oncologic treatment. Son says his primary goal is patient's comfort. Son hopes but does not expect that patient's mental status will improve so that she will be able to take care of legal papers. He would like to give her another 24 hours to see if she has clinically improved and then transfer her to residential hospice. I will ask SW to help coordinate this.   Recommendations/Plan:  Continue supportive care. Son would like to transfer her to residential hospice if no improvement in 24 hours.  SW referral  Thank you for allowing the Palliative Medicine Team to assist in the care of this patient.   Time In: 1330 Time Out: 1400 Total Time 30 minutes Prolonged Time Billed  NO       Greater than 50%  of this time was spent counseling and coordinating care related to the above assessment and plan.  Irean Hong, NP  Please contact Palliative Medicine Team phone at [phone] for questions and concerns.

## 2017-09-05 DIAGNOSIS — C679 Malignant neoplasm of bladder, unspecified: Secondary | ICD-10-CM

## 2017-09-05 DIAGNOSIS — D49511 Neoplasm of unspecified behavior of right kidney: Secondary | ICD-10-CM

## 2017-09-05 DIAGNOSIS — Z515 Encounter for palliative care: Secondary | ICD-10-CM

## 2017-09-05 DIAGNOSIS — I82220 Acute embolism and thrombosis of inferior vena cava: Secondary | ICD-10-CM

## 2017-09-05 LAB — CBC
HEMATOCRIT: 22.1 % — AB (ref 36.0–46.0)
Hemoglobin: 7.6 g/dL — ABNORMAL LOW (ref 12.0–15.0)
MCH: 30.5 pg (ref 26.0–34.0)
MCHC: 34.4 g/dL (ref 30.0–36.0)
MCV: 88.8 fL (ref 78.0–100.0)
Platelets: 218 10*3/uL (ref 150–400)
RBC: 2.49 MIL/uL — ABNORMAL LOW (ref 3.87–5.11)
RDW: 21.9 % — AB (ref 11.5–15.5)
WBC: 4.6 10*3/uL (ref 4.0–10.5)

## 2017-09-05 LAB — BASIC METABOLIC PANEL
Anion gap: 6 (ref 5–15)
BUN: 7 mg/dL — ABNORMAL LOW (ref 8–23)
CHLORIDE: 112 mmol/L — AB (ref 98–111)
CO2: 23 mmol/L (ref 22–32)
Calcium: 7.9 mg/dL — ABNORMAL LOW (ref 8.9–10.3)
Creatinine, Ser: 0.83 mg/dL (ref 0.44–1.00)
GFR calc Af Amer: >60 mL/min (ref >60)
GLUCOSE: 83 mg/dL (ref 70–99)
POTASSIUM: 2.8 mmol/L — AB (ref 3.5–5.1)
Sodium: 141 mmol/L (ref 135–145)

## 2017-09-05 LAB — GLUCOSE, CAPILLARY
GLUCOSE-CAPILLARY: 129 mg/dL — AB (ref 70–99)
GLUCOSE-CAPILLARY: 136 mg/dL — AB (ref 70–99)
GLUCOSE-CAPILLARY: 175 mg/dL — AB (ref 70–99)
GLUCOSE-CAPILLARY: 51 mg/dL — AB (ref 70–99)
GLUCOSE-CAPILLARY: 55 mg/dL — AB (ref 70–99)
Glucose-Capillary: 83 mg/dL (ref 70–99)
Glucose-Capillary: 83 mg/dL (ref 70–99)
Glucose-Capillary: 115 mg/dL — ABNORMAL HIGH (ref 70–99)
Glucose-Capillary: 158 mg/dL — ABNORMAL HIGH (ref 70–99)

## 2017-09-05 LAB — MAGNESIUM: Magnesium: 1.8 mg/dL (ref 1.7–2.4)

## 2017-09-05 LAB — PHOSPHORUS: Phosphorus: 1.5 mg/dL — ABNORMAL LOW (ref 2.5–4.6)

## 2017-09-05 MED ORDER — SODIUM PHOSPHATES 45 MMOLE/15ML IV SOLN
30.0000 mmol | Freq: Once | INTRAVENOUS | Status: AC
  Administered 2017-09-05: 30 mmol via INTRAVENOUS
  Filled 2017-09-05: qty 10

## 2017-09-05 MED ORDER — DEXTROSE 50 % IV SOLN
INTRAVENOUS | Status: AC
  Administered 2017-09-05: 50 mL
  Filled 2017-09-05: qty 50

## 2017-09-05 MED ORDER — INSULIN GLARGINE 100 UNIT/ML ~~LOC~~ SOLN
12.0000 [IU] | Freq: Every day | SUBCUTANEOUS | Status: DC
  Administered 2017-09-05: 12 [IU] via SUBCUTANEOUS
  Filled 2017-09-05 (×2): qty 0.12

## 2017-09-05 MED ORDER — POTASSIUM CHLORIDE 10 MEQ/100ML IV SOLN
10.0000 meq | INTRAVENOUS | Status: AC
  Administered 2017-09-05 (×3): 10 meq via INTRAVENOUS
  Filled 2017-09-05 (×3): qty 100

## 2017-09-05 MED ORDER — HYDRALAZINE HCL 20 MG/ML IJ SOLN
5.0000 mg | Freq: Four times a day (QID) | INTRAMUSCULAR | Status: DC | PRN
  Administered 2017-09-05 – 2017-09-06 (×3): 5 mg via INTRAVENOUS
  Filled 2017-09-05 (×3): qty 1

## 2017-09-05 MED ORDER — INSULIN GLARGINE 100 UNIT/ML ~~LOC~~ SOLN: 15.0000 [IU] | Freq: Every day | SUBCUTANEOUS | Status: DC

## 2017-09-05 MED ORDER — MAGNESIUM SULFATE 2 GM/50ML IV SOLN
2.0000 g | Freq: Once | INTRAVENOUS | Status: AC
Start: 2017-09-05 — End: 2017-09-05
  Administered 2017-09-05: 2 g via INTRAVENOUS
  Filled 2017-09-05: qty 50

## 2017-09-05 MED ORDER — POTASSIUM CHLORIDE 10 MEQ/100ML IV SOLN
10.0000 meq | INTRAVENOUS | Status: DC
  Administered 2017-09-05 (×3): 10 meq via INTRAVENOUS
  Filled 2017-09-05 (×3): qty 100

## 2017-09-05 NOTE — Progress Notes (Addendum)
PROGRESS NOTE  Katie Palmer:[id number] DOB: Oct 27, 1950 DOA: 09/02/2017 PCP: Laurey Morale, MD  HPI/Recap of past 24 hours:  Katie Palmer a 67 y.o.femalewith medical history significant ofendometrial cancer, most recent chemotherapy 3 weeks ago with side effects including uncontrolled nausea, vomiting, dehydration. Chemotherapy has been on hold. Also with past medical history of diabetes, anxiety and depression, essential hypertension.She was admitted from 8/4 until today and was discharged home but had refractory pain and so returned.  09/03/2017: Patient seen and examined with her son and friend at her bedside.  She is somnolent however easily arousable to voices and follows commands.  Does not appear to be in acute distress.  Palliative care team consulted and following.  Dr. Alvy Bimler saw the patient and spoke with her son who is also medical power of attorney.He felt when he is ready he will transition her care to comfort measures.  09/04/2017: Patient seen and examined at her bedside.  She is somnolent however she is easily arousable to voices.  She nods no when asked if she is in pain or having any discomfort.  09/05/2017: Patient seen and examined at her bedside.  She is somnolent but easily arousable to voices.  Does not appear to be in distress.  Hypoglycemia overnight which was corrected.  Insulin dose reduced.  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[place])   Palliative care by specialist   Advance care planning  Resolved type I DKA secondary to insulin noncompliance Patient intentionally removed her insulin pump at home Lantus dose reduced from 20 units to 12 units nightly NovoLog 3 units before meals Minimal oral intake Hemoglobin A1c 7.1 on  09/04/2017 Repeat BMP in the morning  Type 1 diabetes uncontrolled with hypoglycemia Hypoglycemic overnight with Accu-Chek 51 Treated and back to normal Insulin dose decreased Continue to closely monitor  Resolved metabolic acidosis most likely secondary to DKA Continue IV fluid Continue treatment for DKA Repeat BMP this afternoon  Hypophosphatemia Phosphorus 1.5 from 1.2 yesterday Replete with IV phosphorus replacement  Hypokalemia/hypomagnesemia Repleted with IV KCl and IV magnesium supplements Repeat levels in the morning  Chronic normocytic anemia Baseline hemoglobin 9 Hemoglobin 7.6 No sign of overt bleeding Obtain iron studies  Severe protein calorie malnutrition Albumin 2.2 Poor oral intake presently  CKD 3 Appears to be at her baseline with creatinine of 1.1 and GFR of 50 Avoid nephrotoxic agents/dehydration/hypotension Continue to monitor urine output Repeat BMP   Endometrial cancer, terminal per oncology Did not tolerate chemotherapy well-Not a candidate for further chemotherapy Dr Lurlean Leyden following and has addressed goals of care with family Palliative care following and possible meeting with family  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 Continue full dose Lovenox  Bilateral hydronephrosis secondary to ureteral obstruction from tumor recurrence      Code Status: DNR  Family Communication: Son and friend at bedside on 09/03/2017  Disposition Plan: Home versus SNF versus residential hospice   Consultants:  Oncology  Palliative care team  Procedures:  None  Antimicrobials:  None  DVT prophylaxis: Lovenox   Objective: Vitals:   09/05/17 0500 09/05/17 0600 09/05/17 0700 09/05/17 0800  BP: (!) 172/97 (!) 176/81 138/60 (!) 134/54  Pulse:      Resp: (!) 24 (!) 21 (!) 21 18  Temp:  99.4 F (37.4 C)  TempSrc:    Axillary  SpO2: 99% 95% 97%   Weight:      Height:        Intake/Output Summary (Last 24 hours) at  09/05/2017 1034 Last data filed at 09/05/2017 0836 Gross per 24 hour  Intake 2483.95 ml  Output 1625 ml  Net 858.95 ml   Filed Weights   09/02/17 2243  Weight: 92.9 kg    Exam:  . General: 67 y.o. year-old female well-developed well-nourished in no acute distress.  Somnolent but easily arousable to voices. . Cardiovascular: Regular rate and rhythm with no rubs or gallops.  No JVD or thyromegaly noted.   Marland Kitchen Respiratory: Clear to auscultation with no wheezes or rales. Good inspiratory effort. . Abdomen: Soft nontender nondistended with normal bowel sounds x4 quadrants. . Musculoskeletal: Trace edema lower extremities bilaterally. 2/4 pulses in all 4 extremities. Marland Kitchen Psychiatry: Mood is appropriate for condition and setting   Data Reviewed: CBC: Recent Labs  Lab 08/29/17 1622  09/02/17 0421 09/02/17 2331 09/03/17 0648 09/04/17 0332 09/05/17 0609  WBC 4.9   < > 5.3 12.1* 7.3 5.1 4.6  NEUTROABS 3.8  --   --  8.9  --   --   --   HGB 9.0*   < > 8.4* 9.2* 7.4* 7.8* 7.6*  HCT 26.8*   < > 25.4* 30.2* 22.8* 23.4* 22.1*  MCV 89.0   < > 90.7 97.4 92.3 91.1 88.8  PLT 274   < > 268 426* 238 244 218   < > = values in this interval not displayed.   Basic Metabolic Panel: Recent Labs  Lab 08/29/17 1618  09/01/17 0555  09/03/17 1456 09/03/17 1805 09/04/17 0332 09/04/17 1507 09/05/17 0609  NA  --    < > 138   < > 142 139 136 143 141  K  --    < > 4.6   < > 3.6 3.8 3.7 2.9* 2.8*  CL  --    < > 105   < > 117* 114* 109 114* 112*  CO2  --    < > 14*   < > 20* 16* 15* 22 23  GLUCOSE  --    < > 314*   < > 144* 296* 440* 131* 83  BUN  --    < > 7*   < > 8 8 8 9  7*  CREATININE  --    < > 1.18*   < > 1.03* 1.00 1.11* 0.96 0.83  CALCIUM  --    < > 8.5*   < > 8.5* 8.0* 8.1* 8.1* 7.9*  MG 1.7  --  1.7  --   --   --  2.1  --  1.8  PHOS  --   --   --   --   --   --  1.2*  --  1.5*   < > = values in this interval not displayed.   GFR: Estimated Creatinine Clearance: 71.7 mL/min (by C-G formula  based on SCr of 0.83 mg/dL). Liver Function Tests: Recent Labs  Lab 08/29/17 1622 09/02/17 2331 09/04/17 0332  AST 12* 11* 13*  ALT 8 8 8   ALKPHOS 74 99 80  BILITOT 1.0 1.8* 1.1  PROT 6.0* 6.8 5.5*  ALBUMIN 2.5* 2.9* 2.2*   Recent Labs  Lab 08/29/17 1622 09/02/17 2331  LIPASE 18 17   No results for input(s): AMMONIA in the last 168 hours. Coagulation Profile: No results  for input(s): INR, PROTIME in the last 168 hours. Cardiac Enzymes: No results for input(s): CKTOTAL, CKMB, CKMBINDEX, TROPONINI in the last 168 hours. BNP (last 3 results) No results for input(s): PROBNP in the last 8760 hours. HbA1C: Recent Labs    09/04/17 0332  HGBA1C 7.1*   CBG: Recent Labs  Lab 09/04/17 2343 09/05/17 0331 09/05/17 0333 09/05/17 0407 09/05/17 0810  GLUCAP 175* 51* 55* 83 83   Lipid Profile: No results for input(s): CHOL, HDL, LDLCALC, TRIG, CHOLHDL, LDLDIRECT in the last 72 hours. Thyroid Function Tests: No results for input(s): TSH, T4TOTAL, FREET4, T3FREE, THYROIDAB in the last 72 hours. Anemia Panel: No results for input(s): VITAMINB12, FOLATE, FERRITIN, TIBC, IRON, RETICCTPCT in the last 72 hours. Urine analysis:    Component Value Date/Time   COLORURINE YELLOW 08/30/2017 [place] 08/30/2017 0713   LABSPEC 1.008 08/30/2017 0713   PHURINE 6.0 08/30/2017 0713   GLUCOSEU NEGATIVE 08/30/2017 0713   HGBUR NEGATIVE 08/30/2017 0713   HGBUR negative 02/06/2009 0915   BILIRUBINUR NEGATIVE 08/30/2017 0713   BILIRUBINUR 1+ 10/29/2016 1203   KETONESUR 20 (A) 08/30/2017 0713   PROTEINUR NEGATIVE 08/30/2017 0713   UROBILINOGEN 0.2 10/29/2016 1203   UROBILINOGEN 0.2 05/20/2014 0809   NITRITE NEGATIVE 08/30/2017 0713   LEUKOCYTESUR NEGATIVE 08/30/2017 0713   Sepsis Labs: @LABRCNTIP (procalcitonin:4,lacticidven:4)  ) Recent Results (from the past 240 hour(s))  Culture, blood (routine x 2)     Status: None   Collection Time: 08/29/17  4:22 PM  Result  Value Ref Range Status   Specimen Description   Final    BLOOD PORTA CATH Performed at [facility], [place] [address], [place], [place] [address]    Special Requests   Final    BOTTLES DRAWN AEROBIC AND ANAEROBIC Blood Culture adequate volume Performed at [facility] [address], [place], [place] [address]    Culture   Final    NO GROWTH 5 DAYS Performed at [facility], [place] [address] [place], [place] [address]    Report Status 09/03/2017 FINAL  Final  Culture, blood (routine x 2)     Status: None   Collection Time: 08/29/17  4:22 PM  Result Value Ref Range Status   Specimen Description   Final    BLOOD BLOOD LEFT ARM Performed at [facility] [address] [place], [place] [address]    Special Requests   Final    BOTTLES DRAWN AEROBIC AND ANAEROBIC Blood Culture results may not be optimal due to an excessive volume of blood received in culture bottles Performed at [facility] [address] [place], [place] [address]    Culture   Final    NO GROWTH 5 DAYS Performed at [facility], [place] [address], [place], [place] [address]    Report Status 09/03/2017 FINAL  Final  Urine culture     Status: None   Collection Time: 08/30/17  7:13 AM  Result Value Ref Range Status   Specimen Description   Final    URINE, CATHETERIZED Performed at [facility] [address], [place], [place] [address]    Special Requests   Final    Normal Performed at [facility], [place] [address], [place], [place] [address]    Culture   Final    NO GROWTH Performed at [facility], [place] [address] [place], [place] [address]    Report Status  08/31/2017 FINAL  Final  MRSA PCR Screening     Status: None   Collection Time: 09/03/17  3:42 AM  Result Value Ref Range Status   MRSA by PCR NEGATIVE NEGATIVE Final    Comment:        The GeneXpert MRSA Assay  (FDA approved for NASAL specimens only), is one component of a comprehensive MRSA colonization surveillance program. It is not intended to diagnose MRSA infection nor to guide or monitor treatment for MRSA infections. Performed at [facility], [place] [address], [place], [place] [address]       Studies: No results found.  Scheduled Meds: . enoxaparin  1 mg/kg Subcutaneous Q12H  . feeding supplement  1 Container Oral TID BM  . fentaNYL  25 mcg Transdermal Q72H  . insulin aspart  0-15 Units Subcutaneous Q4H  . insulin glargine  12 Units Subcutaneous QHS  . mouth rinse  15 mL Mouth Rinse BID  . oxybutynin  5 mg Oral BID    Continuous Infusions: . sodium chloride Stopped (09/05/17 0823)  . sodium chloride 10 mL/hr at 09/04/17 1018  . potassium chloride 10 mEq (09/05/17 0931)  . sodium phosphate  Dextrose 5% IVPB       LOS: 2 days     Kayleen Memos, MD [facility] Pager [phone]  If 7PM-7AM, please contact night-coverage [email or web address] Password [facility] 09/05/2017, 10:34 AM

## 2017-09-05 NOTE — Progress Notes (Addendum)
CSW met with patient's son, Darnelle Maffucci, and a friend at bedside per son's request for approximately 40 minutes. Patient was sleeping/not engaged during the face to face consult.  Patient's son, Darnelle Maffucci, requested social work consult under the impression that CSW presence was required to be present to complete financial POA and drafting a will for the patient. Son will reportedly have a notary arrive at bedside later this afternoon. CSW informed son of CSW's role in facilitating a healthcare power of attorney with an advanced Architect. CSW clarified that the patient would need to be fully oriented in order to start this process. Son stated he understood and wanted to establish financial POA for the patient. CSW apologized for the misunderstanding.   Patient's son spoke at length regarding dissatisfaction with the patient's care as regarding her last admission and discharge. Son reports that his mother was "suddenly discharged" without notification and not in agreement with the patient's doctor, Dr. Alvy Bimler. Patient states that he left the patient's room on the oncology floor at [facility] on 09/02/17 with the understanding that the patient was waiting for additional testings or screenings and would remain in the hospital. Son states he was called at 2pm and informed that the patient was discharged that he would need to pick up the patient and transport her home.  Son states that the patient was discharged on 09/02/17 without pain medications and with a prescription dated 09/04/17, that he was unable to fill due to the future date. Son states that the patient was in severe pain and that he was advised to bring the patient to the ED or call 911, son states patient was initially resistant to plan. Patient arrived to [facility] via EMS later that evening. Son states that believes the patient's condition has worsened due to the afternoon discharge on 09/02/17.  Son expresses satisfaction with care from ED and ICU  providers and staff. Son expressed appreciation regarding CSW intervention.  CSW processed with patient's son and agreed to document his concerns, per request. Son encouraged to follow up with patient experience.   Patient's son is uncertain of care plan at this time and discharge. Son agreeable to CSW contacting hospice providers. CSW attempted to contact [facility], was unable to reach anyone at this time.   Son is requesting follow up from weekday CSW and with coordination of  hospice care or information regarding hospice options.     Update 4:20pm CSW spoke with [facility] and Palliative Care [phone]). [place]on Pier to follow up with patient and son.

## 2017-09-05 NOTE — Progress Notes (Signed)
Paged NP Schorr about the Pt's bp which is 175/101, 128, 68,22 no return page at the moment. I will continue to monitor.

## 2017-09-05 NOTE — Progress Notes (Signed)
CRITICAL VALUE ALERT  Critical Value:  CBG 55  Date & Time Notied:  09/05/2017 3:48 AM   Provider Notified:  Orders Received/Actions taken:

## 2017-09-06 LAB — BASIC METABOLIC PANEL
Anion gap: 10 (ref 5–15)
BUN: 8 mg/dL (ref 8–23)
CO2: 19 mmol/L — AB (ref 22–32)
CREATININE: 0.9 mg/dL (ref 0.44–1.00)
Calcium: 7.9 mg/dL — ABNORMAL LOW (ref 8.9–10.3)
Chloride: 108 mmol/L (ref 98–111)
GFR calc Af Amer: >60 mL/min (ref >60)
GFR calc non Af Amer: >60 mL/min (ref >60)
Glucose, Bld: 267 mg/dL — ABNORMAL HIGH (ref 70–99)
Potassium: 3.1 mmol/L — ABNORMAL LOW (ref 3.5–5.1)
Sodium: 137 mmol/L (ref 135–145)

## 2017-09-06 LAB — GLUCOSE, CAPILLARY
GLUCOSE-CAPILLARY: 219 mg/dL — AB (ref 70–99)
Glucose-Capillary: 223 mg/dL — ABNORMAL HIGH (ref 70–99)
Glucose-Capillary: 233 mg/dL — ABNORMAL HIGH (ref 70–99)
Glucose-Capillary: 162 mg/dL — ABNORMAL HIGH (ref 70–99)

## 2017-09-06 LAB — CBC
HCT: 23.1 % — ABNORMAL LOW (ref 36.0–46.0)
Hemoglobin: 7.8 g/dL — ABNORMAL LOW (ref 12.0–15.0)
MCH: 30.4 pg (ref 26.0–34.0)
MCHC: 33.8 g/dL (ref 30.0–36.0)
MCV: 89.9 fL (ref 78.0–100.0)
PLATELETS: 212 10*3/uL (ref 150–400)
RBC: 2.57 MIL/uL — ABNORMAL LOW (ref 3.87–5.11)
RDW: 22.3 % — AB (ref 11.5–15.5)
WBC: 4.3 10*3/uL (ref 4.0–10.5)

## 2017-09-06 LAB — MAGNESIUM: Magnesium: 1.9 mg/dL (ref 1.7–2.4)

## 2017-09-06 LAB — PHOSPHORUS: Phosphorus: 2.7 mg/dL (ref 2.5–4.6)

## 2017-09-06 MED ORDER — FENTANYL 50 MCG/HR TD PT72
50.0000 ug | MEDICATED_PATCH | TRANSDERMAL | Status: DC
  Administered 2017-09-06: 50 ug via TRANSDERMAL
  Filled 2017-09-06: qty 1

## 2017-09-06 MED ORDER — CHLORHEXIDINE GLUCONATE CLOTH 2 % EX PADS
6.0000 | MEDICATED_PAD | Freq: Every day | CUTANEOUS | Status: DC
  Administered 2017-09-06: 6 via TOPICAL

## 2017-09-06 MED ORDER — HYDROMORPHONE HCL 1 MG/ML IJ SOLN
1.0000 mg | Freq: Once | INTRAMUSCULAR | Status: AC
  Administered 2017-09-06: 1 mg via INTRAVENOUS
  Filled 2017-09-06: qty 1

## 2017-09-06 MED ORDER — SODIUM CHLORIDE 0.45 % IV SOLN
INTRAVENOUS | Status: DC
  Administered 2017-09-06: 07:00:00 via INTRAVENOUS

## 2017-09-06 MED ORDER — BLOOD GLUCOSE MONITOR KIT: PACK | 0 refills | Status: AC

## 2017-09-06 MED ORDER — MAGNESIUM SULFATE 2 GM/50ML IV SOLN
2.0000 g | Freq: Once | INTRAVENOUS | Status: AC
  Administered 2017-09-06: 2 g via INTRAVENOUS
  Filled 2017-09-06: qty 50

## 2017-09-06 MED ORDER — INSULIN ASPART 100 UNIT/ML ~~LOC~~ SOLN: 3.0000 [IU] | Freq: Three times a day (TID) | SUBCUTANEOUS | 0 refills | Status: AC

## 2017-09-06 MED ORDER — OXYBUTYNIN CHLORIDE 5 MG/5ML PO SYRP: 5.0000 mg | ORAL_SOLUTION | Freq: Two times a day (BID) | ORAL | 0 refills | Status: AC

## 2017-09-06 MED ORDER — PROMETHAZINE HCL 25 MG/ML IJ SOLN
12.5000 mg | INTRAMUSCULAR | Status: DC | PRN
  Administered 2017-09-06 (×3): 12.5 mg via INTRAVENOUS
  Filled 2017-09-06 (×3): qty 1

## 2017-09-06 MED ORDER — FENTANYL 50 MCG/HR TD PT72: 50.0000 ug | MEDICATED_PATCH | TRANSDERMAL | Status: DC

## 2017-09-06 MED ORDER — INSULIN STARTER KIT- SYRINGES (ENGLISH): 1.0000 | Freq: Once | 0 refills | Status: AC

## 2017-09-06 MED ORDER — AMLODIPINE BESYLATE 5 MG PO TABS
5.0000 mg | ORAL_TABLET | Freq: Every day | ORAL | Status: DC
  Administered 2017-09-06: 5 mg via ORAL
  Filled 2017-09-06 (×2): qty 1

## 2017-09-06 MED ORDER — POTASSIUM CHLORIDE 10 MEQ/100ML IV SOLN: 10.0000 meq | INTRAVENOUS | Status: DC

## 2017-09-06 MED ORDER — POTASSIUM CHLORIDE CRYS ER 20 MEQ PO TBCR: 40.0000 meq | EXTENDED_RELEASE_TABLET | Freq: Every day | ORAL | 0 refills | Status: AC

## 2017-09-06 MED ORDER — INSULIN ASPART 100 UNIT/ML ~~LOC~~ SOLN
3.0000 [IU] | Freq: Three times a day (TID) | SUBCUTANEOUS | Status: DC
  Administered 2017-09-06: 3 [IU] via SUBCUTANEOUS

## 2017-09-06 MED ORDER — INSULIN STARTER KIT- SYRINGES (ENGLISH)
1.0000 | Freq: Once | Status: AC
  Administered 2017-09-06: 1
  Filled 2017-09-06: qty 1

## 2017-09-06 MED ORDER — INSULIN GLARGINE 100 UNIT/ML ~~LOC~~ SOLN: 12.0000 [IU] | Freq: Every day | SUBCUTANEOUS | 0 refills | Status: AC

## 2017-09-06 MED ORDER — AMLODIPINE BESYLATE 5 MG PO TABS: 5.0000 mg | ORAL_TABLET | Freq: Every day | ORAL | 0 refills | Status: AC

## 2017-09-06 MED ORDER — INSULIN ASPART 100 UNIT/ML ~~LOC~~ SOLN: 0.0000 [IU] | Freq: Three times a day (TID) | SUBCUTANEOUS | 11 refills | Status: DC

## 2017-09-06 MED ORDER — POTASSIUM CHLORIDE CRYS ER 20 MEQ PO TBCR: 40.0000 meq | EXTENDED_RELEASE_TABLET | Freq: Once | ORAL | Status: DC

## 2017-09-06 MED ORDER — INSULIN ASPART 100 UNIT/ML ~~LOC~~ SOLN: 0.0000 [IU] | Freq: Three times a day (TID) | SUBCUTANEOUS | 11 refills | Status: AC

## 2017-09-06 NOTE — Discharge Summary (Signed)
Discharge Summary  Katie Palmer MOQ:[id number] DOB: 1950/07/15  PCP: Laurey Morale, MD  Admit date: 09/02/2017 Discharge date: 09/06/2017  Time spent: 25 minutes  Recommendations for Outpatient Follow-up:  1. Follow-up with hospice care at [facility]  Discharge Diagnoses:  Active Hospital Problems   Diagnosis Date Noted  . DKA (diabetic ketoacidosis) ([place]) 09/03/2017  . 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 Palliative care by specialist   . Advance care planning   . Endometrial cancer ([place]) 04/15/2017    Resolved Hospital Problems  No resolved problems to display.    Discharge Condition: Stable  Diet recommendation: Resume previous diet  Vitals:   09/06/17 1000 09/06/17 1110  BP: (!) 166/111   Pulse:    Resp: 20   Temp:  99.2 F (37.3 C)  SpO2: (!) 83%     History of present illness:  Katie Palmer a 67 y.o.femalewith medical history significant ofendometrial cancer, most recent chemotherapy 3 weeks ago, hypertension, type 1 diabetes, anxiety and depression who was discharged and readmitted on same day 09/02/2017 after presenting with uncontrolled pain all over.  Also found to be in type I DKA.  Hospital course complicated by somnolence and poor oral intake. Dr. Lucrezia Europe the patient and spoke withherson who is also medical power of attorney. Hefelt when he is ready he will transition her care to comfort measures.  09/06/2017: Patient seen and examined at her bedside.  She is more alert today and communicative.  She reports hurting all over.  Has a fentanyl patch 25 mcg in place.  Dose increased to 50 mcg every 72 hours.  Also nausea and vomiting.  IV Phenergan added as needed.  Patient has a bed at [facility] and will continue with hospice care as requested by her son.  On the day of discharge, the patient was  hemodynamically stable.  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[place])   Palliative care by specialist   Advance care planning  Resolved type I DKA secondary to insulin noncompliance Patient intentionally removed her insulin pump at home Continue Lantus 12 units nightly Start NovoLog 3 units before meals Encourage oral calorie intake when awake Hemoglobin A1c 7.1 on 09/04/2017  Type 1 diabetes uncontrolled with hypoglycemia Hypoglycemic on 09/05/2017 with Accu-Chek 51 Treated and back to normal Insulin dose decreased Continue to closely monitor  Resolved metabolic acidosis most likely secondary to DKA Continue half-normal saline at 75 cc/h  Hypophosphatemia, improving Phosphorus 2.7 from 1.2 Post repletion  Hypokalemia/hypomagnesemia, improving Potassium 3.1 and magnesium 1.9 Repleted with IV KCl and IV magnesium  Chronic normocytic anemia Baseline hemoglobin 9 Hemoglobin 7.8 No sign of overt bleeding Obtain iron studies Transfuse if hemoglobin less than 7  Severe protein calorie malnutrition Albumin 2.2 Poor oral intake presently Continue oral supplement  CKD 3, stable Appears to be at her baseline with creatinine of 0.90 and GFR greater than 60  Avoid nephrotoxic agents/dehydration/hypotension Continue to monitor urine output >2000cc recorded in the last 24 hours  Endometrial cancer, terminal per oncology Did not tolerate chemotherapy well- Not a candidate for further chemotherapy Dr Lurlean Leyden following and has addressed goals of care with  family Palliative care following and possible meeting with family  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 Continue full dose Lovenox  Bilateral hydronephrosis secondary to ureteral  obstruction from tumor recurrence Previously declined nephrostomy tube placement  Ambulatory dysfunction/severe physical debility Hospice care requested by the patient's son who is also medical power of attorney      Procedures:  None  Consultations:  Palliative care team  Discharge Exam: BP (!) 166/111 Comment: prn IV Hydralazine  Pulse 75   Temp 99.2 F (37.3 C) (Oral)   Resp 20   Ht '5\' 4"'  (1.626 m)   Wt 92.9 kg   SpO2 (!) 83%   BMI 35.16 kg/m  . General: 67 y.o. year-old female well developed well nourished in no acute distress.  Alert and oriented x3. . Cardiovascular: Regular rate and rhythm with no rubs or gallops.  No thyromegaly or JVD noted.   Marland Kitchen Respiratory: Clear to auscultation with no wheezes or rales. Good inspiratory effort. . Abdomen: Soft nontender nondistended with normal bowel sounds x4 quadrants. . Musculoskeletal: No lower extremity edema. 2/4 pulses in all 4 extremities. . Skin: No ulcerative lesions noted or rashes, . Psychiatry: Mood is appropriate for condition and setting  Discharge Instructions You were cared for by a hospitalist during your hospital stay. If you have any questions about your discharge medications or the care you received while you were in the hospital after you are discharged, you can call the unit and asked to speak with the hospitalist on call if the hospitalist that took care of you is not available. Once you are discharged, your primary care physician will handle any further medical issues. Please note that NO REFILLS for any discharge medications will be authorized once you are discharged, as it is imperative that you return to your primary care physician (or establish a relationship with a primary care physician if you do not have one) for your aftercare needs so that they can reassess your need for medications and monitor your lab values.   Allergies as of 09/06/2017      Reactions   Armodafinil Other (See Comments)    Flu-like symptoms   Invokana [canagliflozin] Other (See Comments)   "shut down pancreas"    Lasix [furosemide] Diarrhea   Severe diarrhea   Statins    Other reaction(s): Myalgias (muscle pain)   Adhesive [tape] Itching, Rash   Pt has not tried paper tape      Medication List    STOP taking these medications   enoxaparin 100 MG/ML injection Commonly known as:  LOVENOX   insulin pump Soln     TAKE these medications   ALPRAZolam 1 MG tablet Commonly known as:  XANAX TAKE 1/2 TO 1 TABLET BY MOUTH AT BEDTIME AS NEEDED FOR ANXIETY What changed:  See the new instructions.   amLODipine 5 MG tablet Commonly known as:  NORVASC Take 1 tablet (5 mg total) by mouth daily. Start taking on:  09/07/2017   blood glucose meter kit and supplies Kit Dispense based on patient and insurance preference. Use up to four times daily as directed. (FOR ICD-9 250.00, 250.01).   fentaNYL 25 MCG/HR patch Commonly known as:  DURAGESIC - dosed mcg/hr Place 1 patch (25 mcg total) onto the skin every 3 (three) days.   GLUCAGON EMERGENCY 1 MG injection Generic drug:  glucagon Inject 1 mg into the muscle once as needed (low blood sugar).   insulin aspart 100 UNIT/ML injection Commonly known as:  novoLOG  Inject 3 Units into the skin 3 (three) times daily with meals.   insulin aspart 100 UNIT/ML injection Commonly known as:  novoLOG Inject 0-15 Units into the skin 3 (three) times daily with meals.   insulin glargine 100 UNIT/ML injection Commonly known as:  LANTUS Inject 0.12 mLs (12 Units total) into the skin at bedtime.   insulin starter kit- syringes Misc 1 kit by Other route once for 1 dose.   lidocaine-prilocaine cream Commonly known as:  EMLA Apply to affected area once   ondansetron 8 MG tablet Commonly known as:  ZOFRAN Take 1 tablet (8 mg total) by mouth every 8 (eight) hours as needed for refractory nausea / vomiting. Start on day 3 after chemo.   oxybutynin 5 MG/5ML  syrup Commonly known as:  DITROPAN Take 5 mLs (5 mg total) by mouth 2 (two) times daily.   potassium chloride SA 20 MEQ tablet Commonly known as:  K-DUR,KLOR-CON Take 2 tablets (40 mEq total) by mouth daily.   promethazine 25 MG tablet Commonly known as:  PHENERGAN Take 1 tablet (25 mg total) by mouth every 6 (six) hours as needed for nausea or vomiting.      Allergies  Allergen Reactions  . Armodafinil Other (See Comments)    Flu-like symptoms  . Invokana [Canagliflozin] Other (See Comments)    "shut down pancreas"   . Lasix [Furosemide] Diarrhea    Severe diarrhea  . Statins     Other reaction(s): Myalgias (muscle pain)  . Adhesive [Tape] Itching and Rash    Pt has not tried paper tape      The results of significant diagnostics from this hospitalization (including imaging, microbiology, ancillary and laboratory) are listed below for reference.    Significant Diagnostic Studies: Ct Head Wo Contrast  Result Date: 09/03/2017 CLINICAL DATA:  Encephalopathy.  History of stomach cancer. EXAM: CT HEAD WITHOUT CONTRAST TECHNIQUE: Contiguous axial images were obtained from the base of the skull through the vertex without intravenous contrast. COMPARISON:  None. FINDINGS: Brain: No intra-axial mass, edema or midline shift. No large vascular territory infarct. Mild involutional changes of the brain with minimal small vessel ischemic disease. Midline fourth ventricle and basal cisterns without effacement. Vascular: No hyperdense vessel sign or unexpected calcifications. Skull: No aggressive osseous lesions.  Fracture. Sinuses/Orbits: No acute finding. Other: None. IMPRESSION: Minimal small vessel ischemic disease a periventricular white matter. No acute intracranial abnormality. Electronically Signed   By: Ashley Royalty M.D.   On: 09/03/2017 01:28   Ct Abdomen Pelvis W Contrast  Result Date: 08/31/2017 CLINICAL DATA:  67 year old female with history of endometrial carcinoma. Chemotherapy  discontinued 1 month ago. Radiation therapy planned for the morning. Nausea and vomiting. 13 pound weight loss over the past 3 weeks. Decreased appetite. Evaluate for recurrent disease. EXAM: CT ABDOMEN AND PELVIS WITH CONTRAST TECHNIQUE: Multidetector CT imaging of the abdomen and pelvis was performed using the standard protocol following bolus administration of intravenous contrast. CONTRAST:  167m OMNIPAQUE IOHEXOL 300 MG/ML SOLN, 330mISOVUE-300 IOPAMIDOL (ISOVUE-300) INJECTION 61% COMPARISON:  CT the abdomen and pelvis 06/24/2017. FINDINGS: Lower chest: Unremarkable. Hepatobiliary: Perfusion anomaly adjacent to the falciform ligament in the left lobe of the liver, similar to prior examinations (a benign finding). No suspicious cystic or solid hepatic lesions. Very mild intrahepatic biliary ductal dilatation, similar to the prior study. No common bile duct dilatation. Gallbladder is moderately distended. No gallstones. No surrounding pericholecystic fluid or inflammatory changes. Pancreas: No pancreatic mass. No pancreatic ductal dilatation. No  pancreatic or peripancreatic fluid or inflammatory changes. Spleen: Unremarkable. Adrenals/Urinary Tract: Moderate to severe bilateral hydroureteronephrosis. On the right, this appears related to obstruction of the mid right ureter related to retroperitoneal nodal masses which are centrally low-attenuation (likely internally necrotic) and have increased in size, with the largest of these currently measuring 3.0 x 2.1 x 5.8 cm (axial image 56 of series 2 and coronal image 45 of series 4). On the left, the ureteral obstruction is in the distal third of the ureter related to masses along the pelvic sidewall (discussed below) which appear to exert mass effect upon the distal ureter. No suspicious renal lesions. Bilateral adrenal glands are normal in appearance. Urinary bladder is normal in appearance. Stomach/Bowel: Normal appearance of the stomach. No pathologic dilatation  of small bowel or colon. Normal appendix. Vascular/Lymphatic: Aortic atherosclerosis. No aneurysm or dissection noted in the abdominal or pelvic vasculature. Large filling defect in the inferior vena cava measuring 1.7 x 2.1 x 1.1 cm (axial image 40 of series 2 and coronal image 46 of series 4), compatible with a nonocclusive thrombus. Increased number and size of centrally low-attenuation lesions in the retroperitoneum bilaterally and along the pelvic sidewalls (left greater than right), favored to reflect necrotic nodal metastases. The largest of these is in the left retroperitoneum where it partially invades the left psoas muscle (axial image 56 of series 2 and coronal image 48 of series 4) measuring 2.5 x 4.9 x 3.6 cm. Several lesions are also noted along the left pelvic sidewall in the distribution of the left external iliac lymph nodes measuring 2.1 x 2.4 cm (axial image 76 of series 2) and 3.1 x 2.3 cm (axial image 71 of series 2). Reproductive: Status post hysterectomy. Ovaries are not confidently identified may be surgically absent or atrophic. Other: In addition to the presumed nodal metastases discussed above, there are other lesions, including a 1.8 x 2.6 cm lesion in the inferior aspect of the right psoas muscle (axial image 65 of series 2), and a lesion in the left lower quadrant (axial image 67 of series 2) measuring 1.9 x 2.3 cm, which likely reflects a peritoneal implant. Extensive thickening of the presacral soft tissues, new compared to the prior study, potentially treatment related if the patient has had radiation therapy, although this may simply be related to edema from lymphatic congestion. No significant volume of ascites. No pneumoperitoneum. Musculoskeletal: No aggressive appearing lytic or blastic lesions are noted in the visualized portions of the skeleton. IMPRESSION: 1. Findings are highly concerning for progression of disease, with worsening necrotic nodal metastasis in the  retroperitoneum and along the pelvic sidewall bilaterally (left greater than right), soft tissue metastasis in the low right psoas muscle, and intraperitoneal metastatic lesion in the left lower quadrant. This retroperitoneal lymphadenopathy is now associated with obstruction of the ureters bilaterally resulting in moderate to severe bilateral hydroureteronephrosis. 2. 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 This places the patient at substantial risk for devastating pulmonary embolism. 3. Additional incidental findings, similar to prior studies, as above. Critical Value/emergent results were called by telephone at the time of interpretation on 08/31/2017 at 8:22 pm to Dr. Earlie Server, who verbally acknowledged these results. Electronically Signed   By: Vinnie Langton M.D.   On: 08/31/2017 20:22   Dg Abdomen Acute W/chest  Result Date: 09/03/2017 CLINICAL DATA:  Gastric cancer.  Abdominal pain. EXAM: DG ABDOMEN ACUTE W/ 1V CHEST COMPARISON:  CT abdomen pelvis 08/31/2017 FINDINGS: There is a  right chest wall power-injectable Port-A-Cath with tip in the lower SVC via a right internal jugular vein approach. There is no evidence of dilated bowel loops or free intraperitoneal air. No radiopaque calculi or other significant radiographic abnormality is seen. Heart size and mediastinal contours are within normal limits. Both lungs are clear. IMPRESSION: Negative abdominal radiographs.  No acute cardiopulmonary disease. Electronically Signed   By: Ulyses Jarred M.D.   On: 09/03/2017 00:47   Dg Abdomen Acute W/chest  Result Date: 08/29/2017 CLINICAL DATA:  Nausea and vomiting for 3 weeks.  History of cancer. EXAM: DG ABDOMEN ACUTE W/ 1V CHEST COMPARISON:  Abdominal radiograph July 12, 2017 FINDINGS: Cardiomediastinal silhouette is normal. Single-lumen RIGHT chest Port-A-Cath distal tip projecting in proximal superior vena cava. Lungs are clear, no pleural effusions. No pneumothorax.  Soft tissue planes and included osseous structures are non suspicious. Mildly elevated RIGHT hemidiaphragm. Bowel gas pattern is nondilated and nonobstructive. No significant retained large bowel stool volume. 15 mm linear metallic foreign body projecting at LEFT iliac crest on single view. Phleboliths project in the pelvis. No intra-abdominal mass effect, pathologic calcifications or free air. Soft tissue planes and included osseous structures are non-suspicious. IMPRESSION: 1. No acute cardiopulmonary process. 2. Normal bowel gas pattern. 3. Needle-like fragment projecting over LEFT iliac crest on single view, potentially external to patient. Electronically Signed   By: Elon Alas M.D.   On: 08/29/2017 16:43    Microbiology: Recent Results (from the past 240 hour(s))  Culture, blood (routine x 2)     Status: None   Collection Time: 08/29/17  4:22 PM  Result Value Ref Range Status   Specimen Description   Final    BLOOD PORTA CATH Performed at [facility], [place] [address], [place], [place] [address]    Special Requests   Final    BOTTLES DRAWN AEROBIC AND ANAEROBIC Blood Culture adequate volume Performed at [facility] [address] [place], [place] [address]    Culture   Final    NO GROWTH 5 DAYS Performed at [facility], [place] [address], [place], [place] [address]    Report Status 09/03/2017 FINAL  Final  Culture, blood (routine x 2)     Status: None   Collection Time: 08/29/17  4:22 PM  Result Value Ref Range Status   Specimen Description   Final    BLOOD BLOOD LEFT ARM Performed at [facility] [address], [place], [place] [address]    Special Requests   Final    BOTTLES DRAWN AEROBIC AND ANAEROBIC Blood Culture results may not be optimal due to an excessive volume of blood received in culture bottles Performed at [facility] [address] [place], [place] [address]    Culture    Final    NO GROWTH 5 DAYS Performed at [facility], [place] [address], [place], [place] [address]    Report Status 09/03/2017 FINAL  Final  Urine culture     Status: None   Collection Time: 08/30/17  7:13 AM  Result Value Ref Range Status   Specimen Description   Final    URINE, CATHETERIZED Performed at [facility] [address] [place], [place] [address]    Special Requests   Final    Normal Performed at [facility], [place] [address], [place], [place] [address]    Culture   Final    NO GROWTH Performed at  [facility], [place] [address], [place], [place] [address]    Report Status 08/31/2017 FINAL  Final  MRSA PCR Screening     Status: None   Collection Time: 09/03/17  3:42 AM  Result Value Ref Range Status   MRSA by PCR NEGATIVE NEGATIVE Final    Comment:        The GeneXpert MRSA Assay (FDA approved for NASAL specimens only), is one component of a comprehensive MRSA colonization surveillance program. It is not intended to diagnose MRSA infection nor to guide or monitor treatment for MRSA infections. Performed at [facility], [place] [address] [place], [place] [address]      Labs: Basic Metabolic Panel: Recent Labs  Lab 09/01/17 0555  09/03/17 1805 09/04/17 0332 09/04/17 1507 09/05/17 0609 09/06/17 0636  NA 138   < > 139 136 143 141 137  K 4.6   < > 3.8 3.7 2.9* 2.8* 3.1*  CL 105   < > 114* 109 114* 112* 108  CO2 14*   < > 16* 15* 22 23 19*  GLUCOSE 314*   < > 296* 440* 131* 83 267*  BUN 7*   < > '8 8 9 ' 7* 8  CREATININE 1.18*   < > 1.00 1.11* 0.96 0.83 0.90  CALCIUM 8.5*   < > 8.0* 8.1* 8.1* 7.9* 7.9*  MG 1.7  --   --  2.1  --  1.8 1.9  PHOS  --   --   --  1.2*  --  1.5* 2.7   < > = values in this interval not displayed.   Liver Function Tests: Recent Labs  Lab 09/02/17 2331 09/04/17 0332  AST 11* 13*  ALT 8 8  ALKPHOS 99 80  BILITOT 1.8* 1.1  PROT 6.8 5.5*  ALBUMIN 2.9* 2.2*    Recent Labs  Lab 09/02/17 2331  LIPASE 17   No results for input(s): AMMONIA in the last 168 hours. CBC: Recent Labs  Lab 09/02/17 2331 09/03/17 0648 09/04/17 0332 09/05/17 0609 09/06/17 0636  WBC 12.1* 7.3 5.1 4.6 4.3  NEUTROABS 8.9  --   --   --   --   HGB 9.2* 7.4* 7.8* 7.6* 7.8*  HCT 30.2* 22.8* 23.4* 22.1* 23.1*  MCV 97.4 92.3 91.1 88.8 89.9  PLT 426* 238 244 218 212   Cardiac Enzymes: No results for input(s): CKTOTAL, CKMB, CKMBINDEX, TROPONINI in the last 168 hours. BNP: BNP (last 3 results) No results for input(s): BNP in the last 8760 hours.  ProBNP (last 3 results) No results for input(s): PROBNP in the last 8760 hours.  CBG: Recent Labs  Lab 09/05/17 1918 09/05/17 2242 09/06/17 0309 09/06/17 0724 09/06/17 1117  GLUCAP 115* 158* 223* 233* 219*       Signed:  Kayleen Memos, MD [facility] 09/06/2017, 1:56 PM

## 2017-09-06 NOTE — Progress Notes (Addendum)
[facility]  [facility] paperwork completed with Son. Physician aware.  D/C summary faxed to: [phone] [facility] to transport. Med. Nes. Complete.   2:44pm [facility] arranged for transport.   Kathrin Greathouse, Marlinda Mike, MSW Clinical Social Worker  [phone] 09/06/2017  2:27 PM

## 2017-09-06 NOTE — Progress Notes (Signed)
Called report to [facility]. Nurse aware that patient to be discharged from [facility] with accessed right port and also newly placed [facility] per family request.  All orders reviewed and questions answered.  RN given number to call in case of any questions.

## 2017-09-06 NOTE — Care Management Note (Signed)
Case Management Note  Patient Details  Name: Katie Palmer MRN: [id number] Date of Birth: 02-25-1950  Subjective/Objective:      Endometrial ca,pain,pt is somnolent but does arouse to calling of her name/ hgb 7.8, iv 1/2 ns at 75cc/hr, rec'd iv fentaNYL q1 hour for pain.              Action/Plan: [facility]ann is for snf versus home with hospice/ son is involved and attempting to become patient's POA-has filled out the paperwork.  Expected Discharge Date:  (unknown)               Expected Discharge Plan:  [place]  In-House Referral:  Hospice / Palliative Care  Discharge planning Services  CM Consult  Post Acute Care Choice:  Home Health Choice offered to:  Patient  DME Arranged:    DME Agency:     HH Arranged:  RN Story Agency:  [facility]  Status of Service:  In process, will continue to follow  If discussed at Long Length of Stay Meetings, dates discussed:    Additional Comments:  Leeroy Cha, RN 09/06/2017, 8:41 AM

## 2017-09-06 NOTE — Progress Notes (Signed)
PROGRESS NOTE  Katie Palmer TDD:[id number] DOB: [birth year] DOA: 09/02/2017 PCP: Laurey Morale, MD  HPI/Recap of past 24 hours:  Katie Palmer a 67 y.o.femalewith medical history significant ofendometrial cancer, most recent chemotherapy 3 weeks ago, hypertension, type 1 diabetes, anxiety and depression who was discharged and readmitted on same day 09/02/2017 after presenting with uncontrolled pain all over.  Also found to be in type I DKA.  Hospital course complicated by somnolence and poor oral intake. Dr. Alvy Bimler saw the patient and spoke with her son who is also medical power of attorney. He felt when he is ready he will transition her care to comfort measures.  09/06/2017: Patient seen and examined at her bedside.  She is more alert today and communicative.  She reports hurting all over.  Has a fentanyl patch 25 mcg in place.  Dose increased to 50 mcg every 72 hours.  Also nausea and vomiting.  IV Phenergan added as needed.  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[place])   Palliative care by specialist   Advance care planning  Resolved type I DKA secondary to insulin noncompliance Patient intentionally removed her insulin pump at home Continue Lantus 12 units nightly Start NovoLog 5 units before meals Encourage oral calorie intake when awake Hemoglobin A1c 7.1 on 09/04/2017 Repeat BMP in the morning  Type 1 diabetes uncontrolled with hypoglycemia Hypoglycemic on 09/05/2017 with Accu-Chek 51 Treated and back to normal Insulin dose decreased Continue to closely monitor  Resolved metabolic acidosis most likely secondary to DKA Continue half-normal saline at 75 cc/h  Hypophosphatemia, improving Phosphorus 2.7 from 1.2 Post  repletion  Hypokalemia/hypomagnesemia, improving Potassium 3.1 and magnesium 1.9 Repleted with IV KCl and IV magnesium  Chronic normocytic anemia Baseline hemoglobin 9 Hemoglobin 7.8 No sign of overt bleeding Obtain iron studies Transfuse if hemoglobin less than 7  Severe protein calorie malnutrition Albumin 2.2 Poor oral intake presently Continue oral supplement  CKD 3, stable Appears to be at her baseline with creatinine of 0.90 and GFR greater than 60  Avoid nephrotoxic agents/dehydration/hypotension Continue to monitor urine output >2000cc recorded in the last 24 hours  Endometrial cancer, terminal per oncology Did not tolerate chemotherapy well- Not a candidate for further chemotherapy Dr Lurlean Leyden following and has addressed goals of care with family Palliative care following and possible meeting with family  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 Continue full dose Lovenox  Bilateral hydronephrosis secondary to ureteral obstruction from tumor recurrence Previously declined nephrostomy tube placement     Code Status: DNR  Family Communication: Son and friend at bedside on 09/03/2017  Disposition Plan: Home versus SNF versus residential hospice   Consultants:  Oncology  Palliative care team  Procedures:  None  Antimicrobials:  None  DVT prophylaxis: Full dose Lovenox twice daily   Objective: Vitals:   09/06/17 0715 09/06/17 0800 09/06/17 1000 09/06/17 1110  BP:  137/66 (!) 166/111   Pulse:      Resp:  18 20   Temp: 98.6 F (37 C)   99.2 F (37.3 C)  TempSrc: Oral   Oral  SpO2:  97% (!) 83%   Weight:      Height:        Intake/Output Summary (Last 24 hours) at 09/06/2017 1335 Last data filed at  09/06/2017 1211 Gross per 24 hour  Intake 2351.35 ml  Output 1700 ml  Net 651.35 ml   Filed Weights   09/02/17 2243  Weight: 92.9 kg    Exam:  . General: 67 y.o. year-old female obese in no acute distress.  Alert and  communicative.   . Cardiovascular: Regular rate and rhythm with no rubs or gallops.  No JVD or thyromegaly noted. Marland Kitchen Respiratory: Clear to auscultation with no wheezes or rales. Good inspiratory effort. . Abdomen: Soft nontender nondistended with normal bowel sounds x4 quadrants. . Musculoskeletal: Trace edema lower extremities bilaterally. 2/4 pulses in all 4 extremities. Marland Kitchen Psychiatry: Mood is appropriate for condition and setting   Data Reviewed: CBC: Recent Labs  Lab 09/02/17 2331 09/03/17 0648 09/04/17 0332 09/05/17 0609 09/06/17 0636  WBC 12.1* 7.3 5.1 4.6 4.3  NEUTROABS 8.9  --   --   --   --   HGB 9.2* 7.4* 7.8* 7.6* 7.8*  HCT 30.2* 22.8* 23.4* 22.1* 23.1*  MCV 97.4 92.3 91.1 88.8 89.9  PLT 426* 238 244 218 373   Basic Metabolic Panel: Recent Labs  Lab 09/01/17 0555  09/03/17 1805 09/04/17 0332 09/04/17 1507 09/05/17 0609 09/06/17 0636  NA 138   < > 139 136 143 141 137  K 4.6   < > 3.8 3.7 2.9* 2.8* 3.1*  CL 105   < > 114* 109 114* 112* 108  CO2 14*   < > 16* 15* 22 23 19*  GLUCOSE 314*   < > 296* 440* 131* 83 267*  BUN 7*   < > 8 8 9  7* 8  CREATININE 1.18*   < > 1.00 1.11* 0.96 0.83 0.90  CALCIUM 8.5*   < > 8.0* 8.1* 8.1* 7.9* 7.9*  MG 1.7  --   --  2.1  --  1.8 1.9  PHOS  --   --   --  1.2*  --  1.5* 2.7   < > = values in this interval not displayed.   GFR: Estimated Creatinine Clearance: 66.1 mL/min (by C-G formula based on SCr of 0.9 mg/dL). Liver Function Tests: Recent Labs  Lab 09/02/17 2331 09/04/17 0332  AST 11* 13*  ALT 8 8  ALKPHOS 99 80  BILITOT 1.8* 1.1  PROT 6.8 5.5*  ALBUMIN 2.9* 2.2*   Recent Labs  Lab 09/02/17 2331  LIPASE 17   No results for input(s): AMMONIA in the last 168 hours. Coagulation Profile: No results for input(s): INR, PROTIME in the last 168 hours. Cardiac Enzymes: No results for input(s): CKTOTAL, CKMB, CKMBINDEX, TROPONINI in the last 168 hours. BNP (last 3 results) No results for input(s): PROBNP in the last  8760 hours. HbA1C: Recent Labs    09/04/17 0332  HGBA1C 7.1*   CBG: Recent Labs  Lab 09/05/17 1918 09/05/17 2242 09/06/17 0309 09/06/17 0724 09/06/17 1117  GLUCAP 115* 158* 223* 233* 219*   Lipid Profile: No results for input(s): CHOL, HDL, LDLCALC, TRIG, CHOLHDL, LDLDIRECT in the last 72 hours. Thyroid Function Tests: No results for input(s): TSH, T4TOTAL, FREET4, T3FREE, THYROIDAB in the last 72 hours. Anemia Panel: No results for input(s): VITAMINB12, FOLATE, FERRITIN, TIBC, IRON, RETICCTPCT in the last 72 hours. Urine analysis:    Component Value Date/Time   COLORURINE YELLOW 08/30/2017 [place] 08/30/2017 0713   LABSPEC 1.008 08/30/2017 0713   PHURINE 6.0 08/30/2017 0713   GLUCOSEU NEGATIVE 08/30/2017 0713   HGBUR NEGATIVE 08/30/2017 0713   HGBUR negative  02/06/2009 0915   BILIRUBINUR NEGATIVE 08/30/2017 0713   BILIRUBINUR 1+ 10/29/2016 1203   KETONESUR 20 (A) 08/30/2017 0713   PROTEINUR NEGATIVE 08/30/2017 0713   UROBILINOGEN 0.2 10/29/2016 1203   UROBILINOGEN 0.2 05/20/2014 0809   NITRITE NEGATIVE 08/30/2017 0713   LEUKOCYTESUR NEGATIVE 08/30/2017 0713   Sepsis Labs: @LABRCNTIP (procalcitonin:4,lacticidven:4)  ) Recent Results (from the past 240 hour(s))  Culture, blood (routine x 2)     Status: None   Collection Time: 08/29/17  4:22 PM  Result Value Ref Range Status   Specimen Description   Final    BLOOD PORTA CATH Performed at [facility], [place] [address], [place], [place] [address]    Special Requests   Final    BOTTLES DRAWN AEROBIC AND ANAEROBIC Blood Culture adequate volume Performed at [facility] [address], [place], [place] [address]    Culture   Final    NO GROWTH 5 DAYS Performed at [facility], [place] [address], [place], [place] [address]    Report Status 09/03/2017 FINAL  Final  Culture, blood (routine x 2)     Status: None   Collection Time: 08/29/17  4:22 PM  Result  Value Ref Range Status   Specimen Description   Final    BLOOD BLOOD LEFT ARM Performed at [facility] [address] [place], [place] [address]    Special Requests   Final    BOTTLES DRAWN AEROBIC AND ANAEROBIC Blood Culture results may not be optimal due to an excessive volume of blood received in culture bottles Performed at [place] [facility] [place] [address], [place], [place] [address]    Culture   Final    NO GROWTH 5 DAYS Performed at [facility], [place] [address], [place], [place] [address]    Report Status 09/03/2017 FINAL  Final  Urine culture     Status: None   Collection Time: 08/30/17  7:13 AM  Result Value Ref Range Status   Specimen Description   Final    URINE, CATHETERIZED Performed at [facility] [address] [place], [place] [address]    Special Requests   Final    Normal Performed at [facility], [place] [address] [place], [place] [address]    Culture   Final    NO GROWTH Performed at [facility], [place] [address] [place],  [address]    Report Status 08/31/2017 FINAL  Final  MRSA PCR Screening     Status: None   Collection Time: 09/03/17  3:42 AM  Result Value Ref Range Status   MRSA by PCR NEGATIVE NEGATIVE Final    Comment:        The GeneXpert MRSA Assay (FDA approved for NASAL specimens only), is one component of a comprehensive MRSA colonization surveillance program. It is not intended to diagnose MRSA infection nor to guide or monitor treatment for MRSA infections. Performed at [facility], [place] [address] [place],  [address]       Studies: No results found.  Scheduled Meds: . amLODipine  5 mg Oral Daily  . Chlorhexidine Gluconate Cloth  6 each Topical Q0600  . enoxaparin  1 mg/kg Subcutaneous Q12H  . feeding supplement  1 Container Oral TID BM  . [START ON September 27, 2017] fentaNYL  50 mcg Transdermal Q72H  . insulin  aspart  0-15 Units Subcutaneous Q4H  . insulin glargine  12 Units Subcutaneous  QHS  . mouth rinse  15 mL Mouth Rinse BID  . oxybutynin  5 mg Oral BID    Continuous Infusions: . sodium chloride 75 mL/hr at 09/06/17 1211  . sodium chloride 10 mL/hr at 09/04/17 1018  . magnesium sulfate 1 - 4 g bolus IVPB    . potassium chloride       LOS: 3 days     Kayleen Memos, MD [facility] Pager [phone]  If 7PM-7AM, please contact night-coverage [email or web address] Password [email or web address] 09/06/2017, 1:35 PM

## 2017-09-06 NOTE — Plan of Care (Signed)
Patients vitals remained stable.  Pain was difficult to control and may require more aggressive treatment today.  Still awaiting lab results.

## 2017-09-06 NOTE — Discharge Instructions (Signed)
Type 1 Diabetes Mellitus, Diagnosis, Adult Type 1 diabetes (type 1 diabetes mellitus) is a long-term (chronic) disease. It happens when your body does not make enough of a hormone called insulin. Insulin lets sugars (glucose) go into cells in the body. This gives you energy. If your body does not make enough insulin, sugars cannot get into cells. This causes high blood sugar (hyperglycemia). Your doctor will set treatment goals for you. Generally, you should have these blood sugar levels:  Before meals (preprandial): 80-130 mg/dL (4.4-7.2 mmol/L).  After meals (postprandial): below 180 mg/dL (10 mmol/L).  A1c (hemoglobin A1c) level: less than 7%.  Follow these instructions at home: Questions to Ask Your Doctor  You may want to ask these questions:  Do I need to meet with a diabetes educator?  Where can I find a support group for people with diabetes?  What equipment will I need to care for myself at home?  What diabetes medicines do I need? When should I take them?  How often do I need to check my blood sugar?  What number can I call if I have questions?  When is my next doctor's visit?  General instructions  Take over-the-counter and prescription medicines only as told by your doctor.  Keep all follow-up visits as told by your doctor. This is important. Contact a doctor if:  Your blood sugar is at or above 240 mg/dL (13.3 mmol/L) for 2 days in a row.  You have been sick or have had a fever for 2 days or more, and you are not getting better.  You have any of these problems for more than 6 hours: ? You cannot eat or drink. ? You feel sick to your stomach (nauseous). ? You throw up (vomit). ? You have watery poop (diarrhea). Get help right away if:  Your blood sugar is lower than 54 mg/dL (3 mmol/L).  You get confused.  You have trouble: ? Thinking clearly. ? Breathing.  You have moderate or large ketone levels in your pee (urine). This information is not  intended to replace advice given to you by your health care provider. Make sure you discuss any questions you have with your health care provider. Document Released: 05/06/2015 Document Revised: 06/20/2015 Document Reviewed: 02/15/2015 Elsevier Interactive Patient Education  2018 [employer].   Type 1 Diabetes Mellitus, Diagnosis, Adult Type 1 diabetes (type 1 diabetes mellitus) is a long-term (chronic) disease. It happens when your body does not make enough of a hormone called insulin. Insulin lets sugars (glucose) go into cells in the body. This gives you energy. If your body does not make enough insulin, sugars cannot get into cells. This causes high blood sugar (hyperglycemia). Your doctor will set treatment goals for you. Generally, you should have these blood sugar levels:  Before meals (preprandial): 80-130 mg/dL (4.4-7.2 mmol/L).  After meals (postprandial): below 180 mg/dL (10 mmol/L).  A1c (hemoglobin A1c) level: less than 7%.  Follow these instructions at home: Questions to Ask Your Doctor  You may want to ask these questions:  Do I need to meet with a diabetes educator?  Where can I find a support group for people with diabetes?  What equipment will I need to care for myself at home?  What diabetes medicines do I need? When should I take them?  How often do I need to check my blood sugar?  What number can I call if I have questions?  When is my next doctor's visit?  General instructions  Take over-the-counter and prescription medicines only as told by your doctor.  Keep all follow-up visits as told by your doctor. This is important. Contact a doctor if:  Your blood sugar is at or above 240 mg/dL (13.3 mmol/L) for 2 days in a row.  You have been sick or have had a fever for 2 days or more, and you are not getting better.  You have any of these problems for more than 6 hours: ? You cannot eat or drink. ? You feel sick to your stomach (nauseous). ? You throw  up (vomit). ? You have watery poop (diarrhea). Get help right away if:  Your blood sugar is lower than 54 mg/dL (3 mmol/L).  You get confused.  You have trouble: ? Thinking clearly. ? Breathing.  You have moderate or large ketone levels in your pee (urine). This information is not intended to replace advice given to you by your health care provider. Make sure you discuss any questions you have with your health care provider. Document Released: 05/06/2015 Document Revised: 06/20/2015 Document Reviewed: 02/15/2015 Elsevier Interactive Patient Education  [employer].

## 2017-09-09 ENCOUNTER — Ambulatory Visit: Payer: Medicare Other | Admitting: Radiation Oncology

## 2017-09-13 ENCOUNTER — Ambulatory Visit: Payer: Medicare Other | Admitting: Hematology and Oncology

## 2017-09-13 ENCOUNTER — Other Ambulatory Visit: Payer: Medicare Other

## 2017-09-13 ENCOUNTER — Ambulatory Visit: Payer: Medicare Other

## 2017-09-16 ENCOUNTER — Encounter: Payer: Medicare Other | Admitting: Nutrition

## 2017-09-16 ENCOUNTER — Ambulatory Visit: Payer: Medicare Other | Admitting: Radiation Oncology

## 2017-09-23 ENCOUNTER — Ambulatory Visit: Payer: Medicare Other | Admitting: Radiation Oncology

## 2017-09-26 DEATH — deceased

## 2017-09-30 ENCOUNTER — Ambulatory Visit: Payer: Medicare Other | Admitting: Radiation Oncology

## 2017-10-07 ENCOUNTER — Ambulatory Visit: Payer: Medicare Other | Admitting: Radiation Oncology

## 2017-10-07 ENCOUNTER — Telehealth: Payer: Self-pay | Admitting: Oncology

## 2017-10-07 NOTE — Telephone Encounter (Signed)
Called [facility] about patient's status.  They said she passed away on September 17, 2017.

## 2017-10-21 DIAGNOSIS — Z515 Encounter for palliative care: Secondary | ICD-10-CM

## 2017-12-09 ENCOUNTER — Ambulatory Visit: Payer: Medicare Other

## 2018-07-20 IMAGING — CT CT IMAGE GUIDED DRAINAGE BY PERCUTANEOUS CATHETER
1 of 2 series · 14 of 32 positions shown, 18 images · non-contrast
Comparison: none

CLINICAL DATA: Endometrial adenocarcinoma status post hysterectomy
and lymph node biopsy. Postop retroperitoneal fluid collections.
Bilateral drain catheters have been placed. There is enlarging left
retroperitoneal collection at the level of the aortic bifurcation
and additional drainage requested.

[Series 2: i-spiral 5.0 b40f · axial · 0.98mm/px · z∈[+1013,+1216]mm · 14 of 64 slices shown, 18 images]
[im 3/64  soft-tissue]
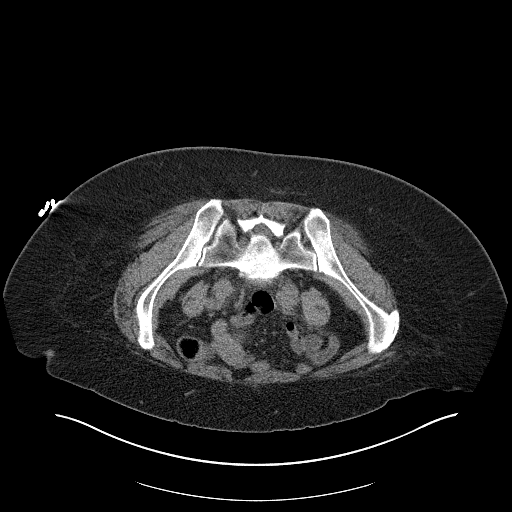
[im 3/64  bone]
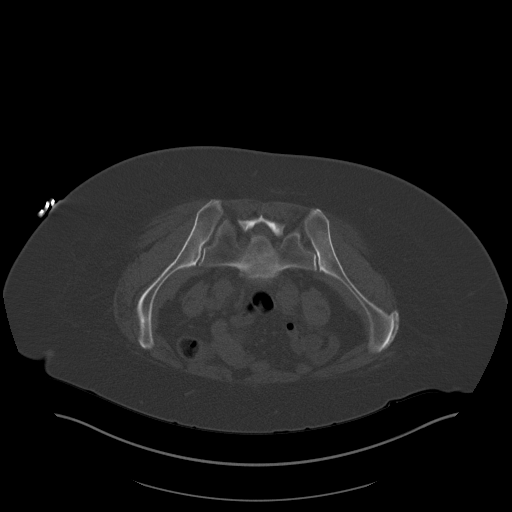
[im 9/64  soft-tissue]
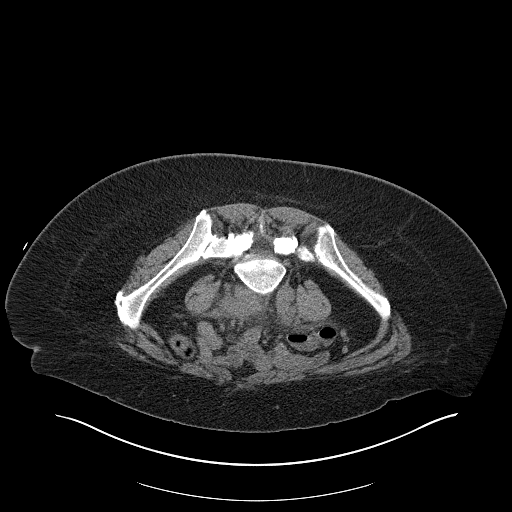
[im 15/64  soft-tissue]
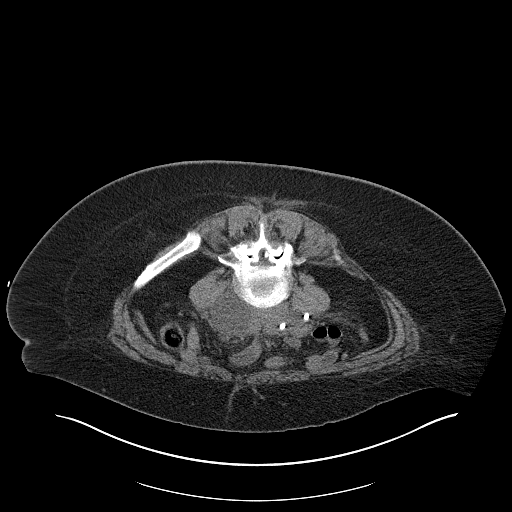
[im 21/64  soft-tissue]
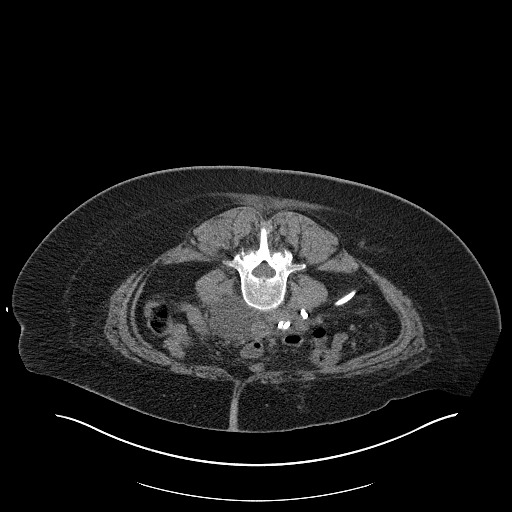
[im 23/64  soft-tissue]
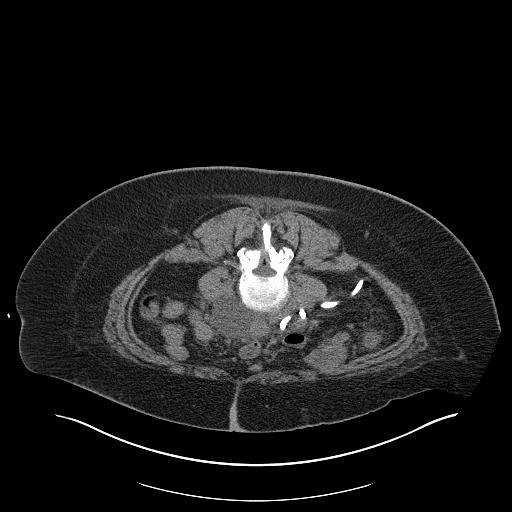
[im 29/64  soft-tissue]
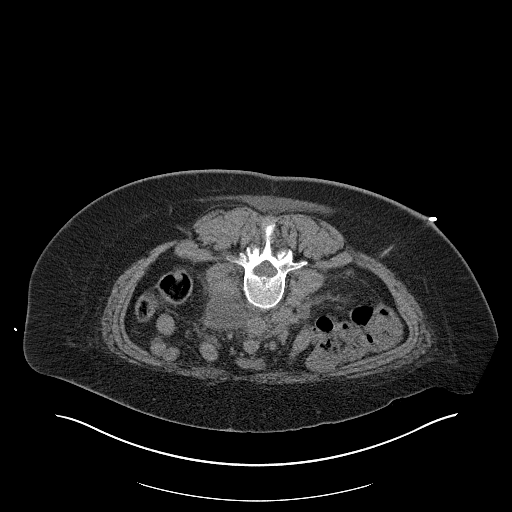
[im 35/64  soft-tissue]
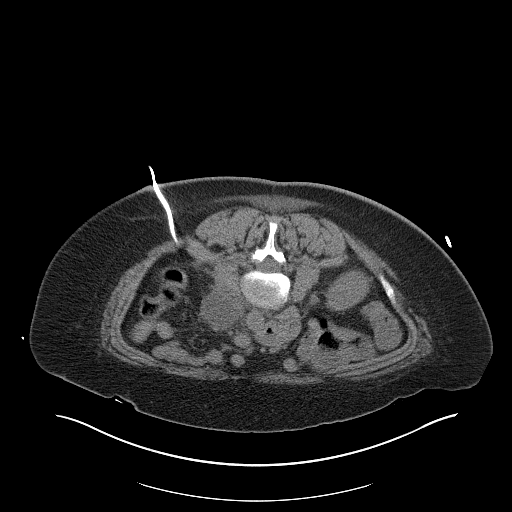
[im 41/64  soft-tissue]
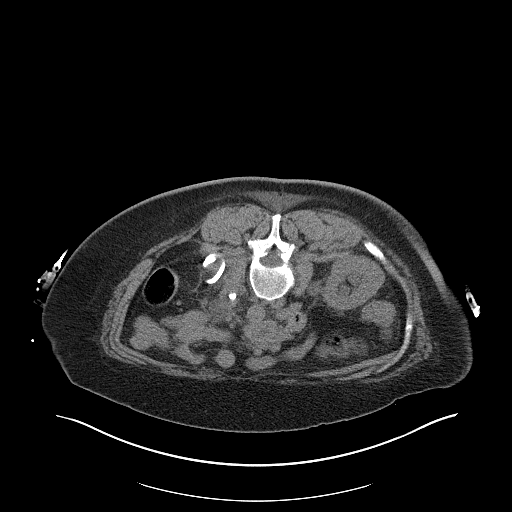
[im 43/64  soft-tissue]
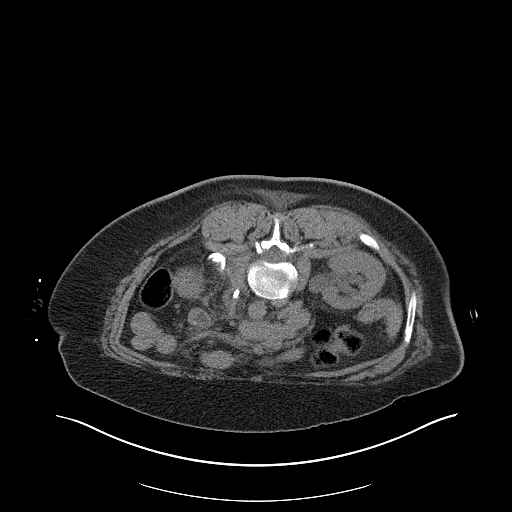
[im 43/64  bone]
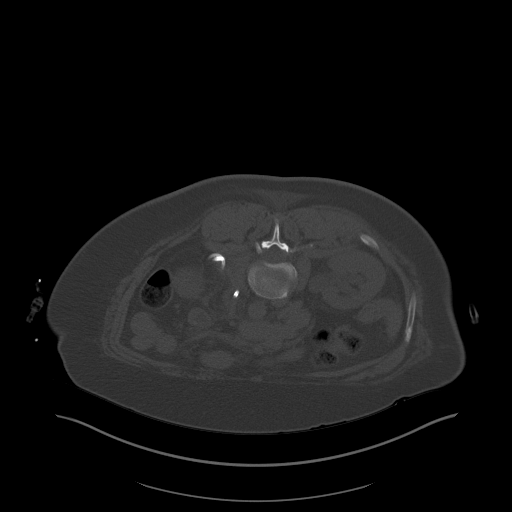
[im 49/64  soft-tissue]
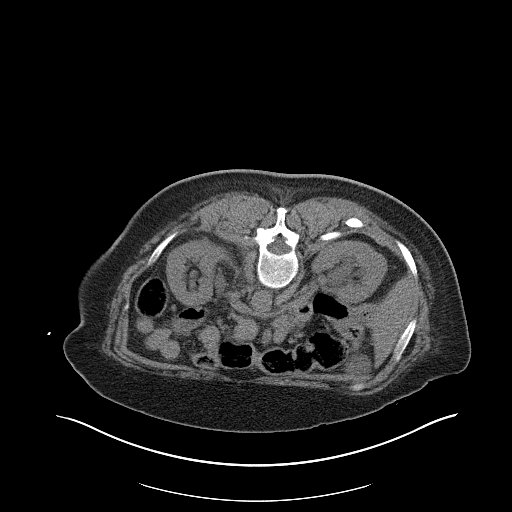
[im 52/64  lung]
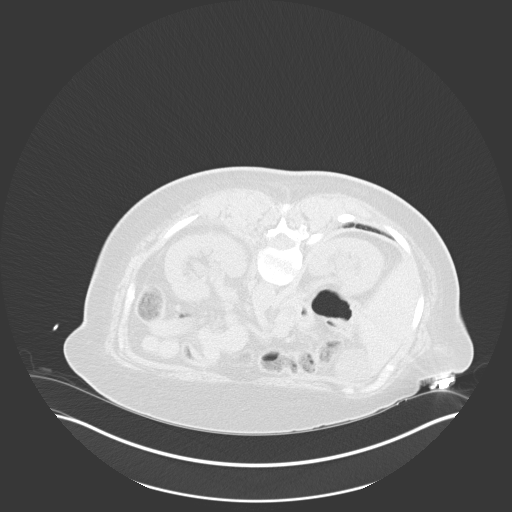
[im 55/64  soft-tissue]
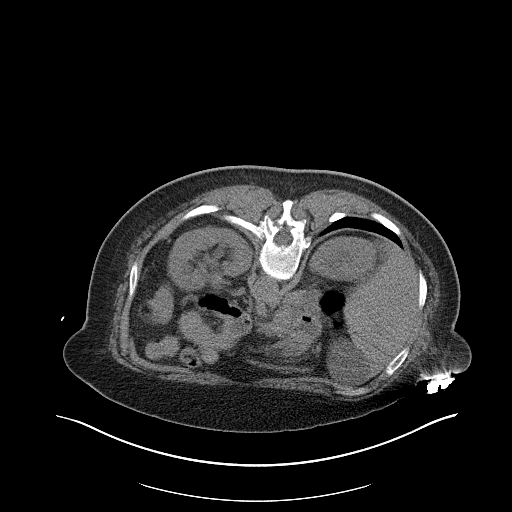
[im 55/64  lung]
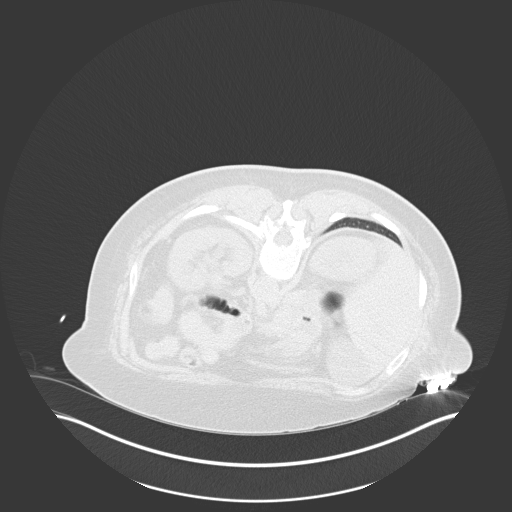
[im 58/64  lung]
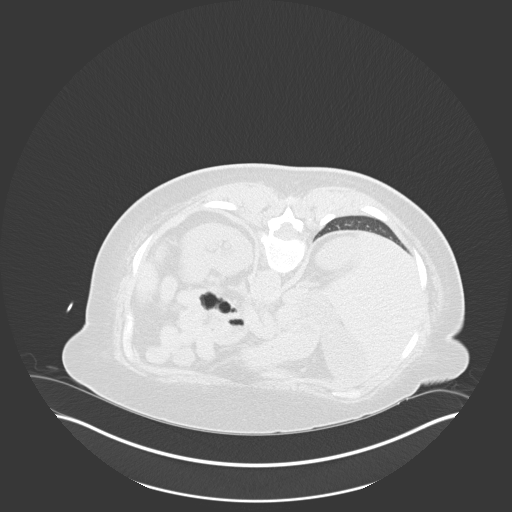
[im 61/64  soft-tissue]
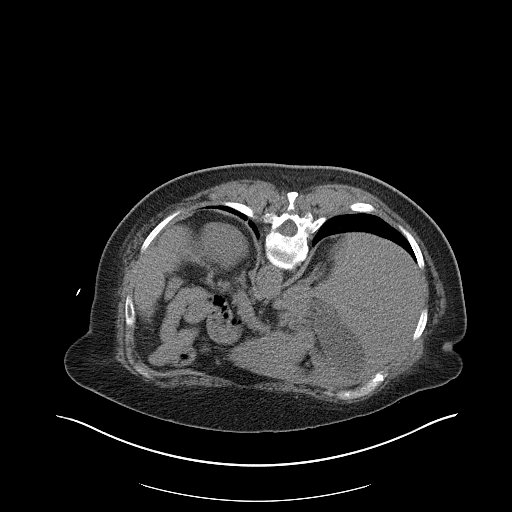
[im 61/64  lung]
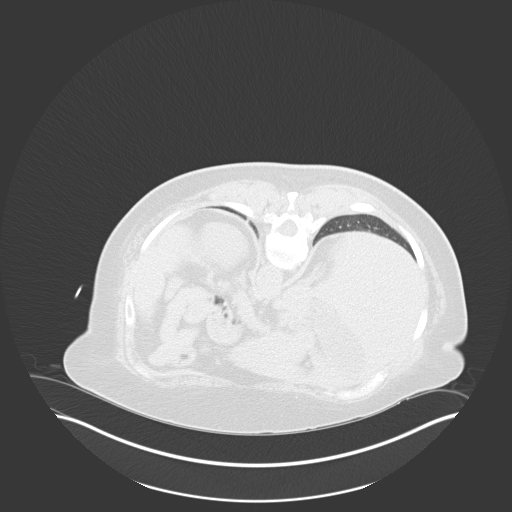

[14 of 32 positions shown; findings below may reference images not displayed]

EXAM:
CT GUIDED DRAINAGE OF LEFT RETROPERITONEAL ABSCESS

ANESTHESIA/SEDATION:
Intravenous Fentanyl and Versed were administered as conscious
sedation during continuous monitoring of the patient's level of
consciousness and physiological / cardiorespiratory status by the
radiology RN, with a total moderate sedation time of 17 minutes.

PROCEDURE:
The procedure, risks, benefits, and alternatives were explained to
the patient. Questions regarding the procedure were encouraged and
answered. The patient understands and consents to the procedure.

Patient placed prone. Select axial scans through the lower abdomen
obtained an the left enlarging retroperitoneal collection was
localized. An appropriate skin entry site was determined and marked.

The operative field was prepped with chlorhexidinein a sterile
fashion, and a sterile drape was applied covering the operative
field. A sterile gown and sterile gloves were used for the
procedure. Local anesthesia was provided with 1% Lidocaine.

Under CT fluoroscopic guidance, a 18 gauge trocar needle was was
advanced into the collection. Thin pale yellow fluid could be
aspirated. Amplatz guidewire advanced easily within the collection,
its position confirmed on CT. Tract dilated to facilitate placement
of a 12 French pigtail drain catheter, formed centrally within the
collection. CT confirms appropriate catheter position. A total of 50
mL thin pale yellow fluid were aspirated, sample sent for
creatinine. The catheter was then secured externally with 0 Prolene
suture and placed a bulb suction. The patient tolerated the
procedure well.

COMPLICATIONS:
None immediate
FINDINGS: The left retroperitoneal enlarging fluid collection was again
localized. 12 French drain catheter placed as above. 50 mL pale thin
yellow fluid were aspirated during the procedure, a sample sent for
creatinine.
IMPRESSION: 1. Technically successful left retroperitoneal drain catheter
placement.

PLAN:
Continue daily flushing and bulb suction of all  3 drains.

Follow-up in drain clinic in 2 weeks for CT and possible removal of
1 or more drains.

## 2018-08-23 IMAGING — CT CT ABD-PELV W/ CM
2 of 4 series · 15 of 46 positions shown, 17 images · IV contrast (iopamidol)
Comparison: 06/09/2017

CLINICAL DATA: Status post hysterectomy and lymph node dissection
for carcinosarcoma of the endometrium. Status post postoperative
retroperitoneal lymphocele drainage with placement of right and left
retroperitoneal drains on 04/30/2017 and additional left
retroperitoneal drainage catheter placement on 05/21/2017. Currently
with single remaining left-sided retroperitoneal drainage catheter.

EXAM:
CT ABDOMEN AND PELVIS WITH CONTRAST
TECHNIQUE: Multidetector CT imaging of the abdomen and pelvis was performed
using the standard protocol following bolus administration of
intravenous contrast.
CONTRAST:  100mL PGKSM4-0BB IOPAMIDOL ([id number]) INJECTION 61%

[Series 2: abd pelvis 5.00 br40 s3 ax · axial · 0.87mm/px · z∈[+1071,+1516]mm · 12 of 99 slices shown, 14 images]
[im 5/99  soft-tissue]
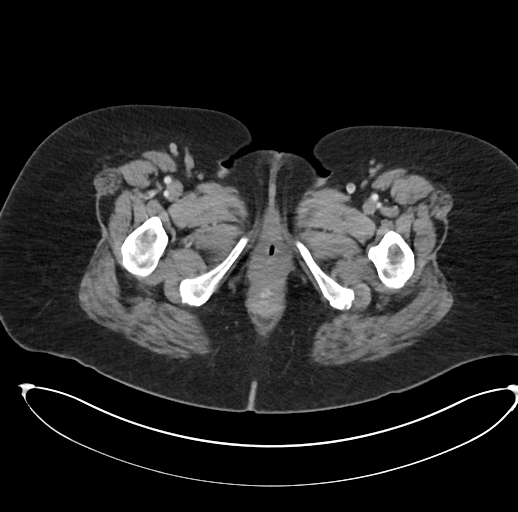
[im 5/99  bone]
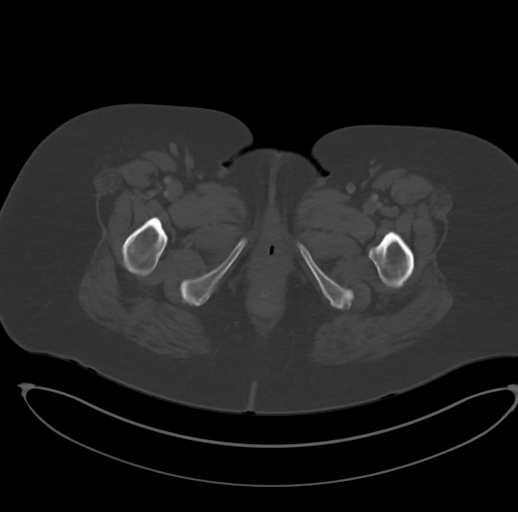
[im 14/99  soft-tissue]
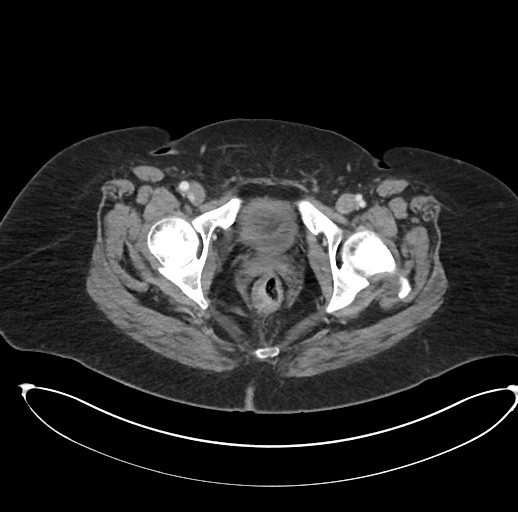
[im 23/99  soft-tissue]
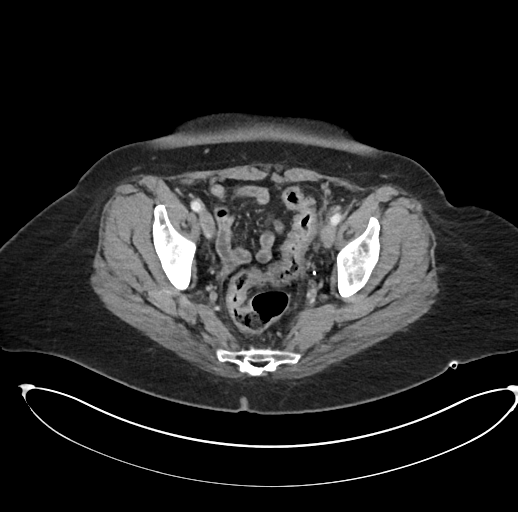
[im 32/99  soft-tissue]
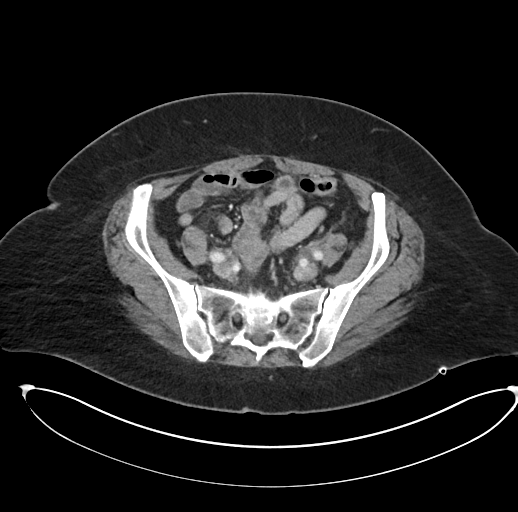
[im 36/99  soft-tissue]
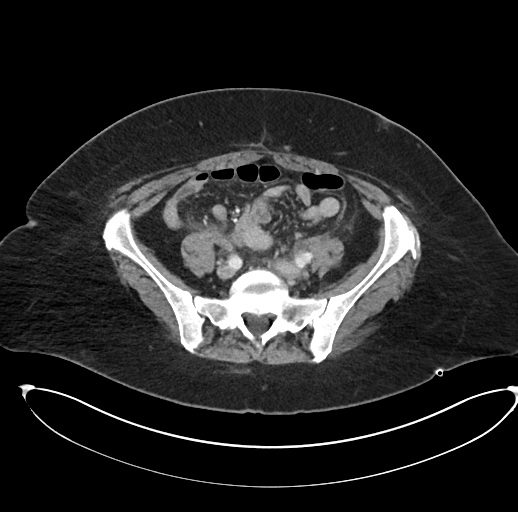
[im 45/99  soft-tissue]
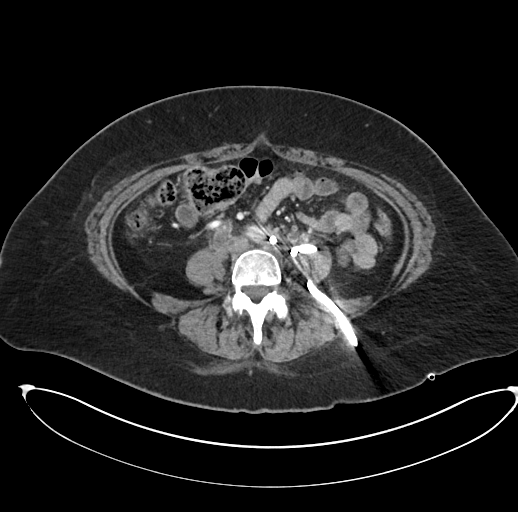
[im 54/99  soft-tissue]
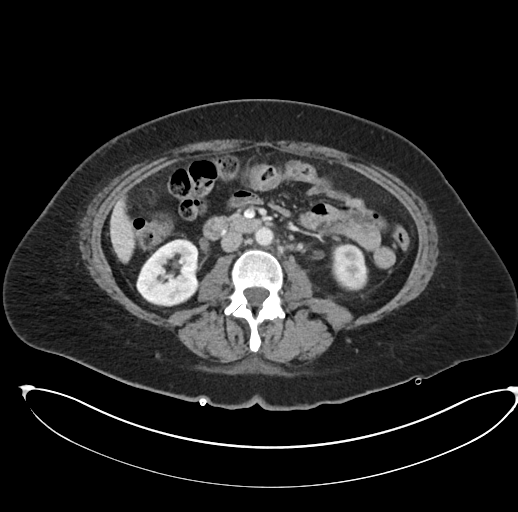
[im 63/99  soft-tissue]
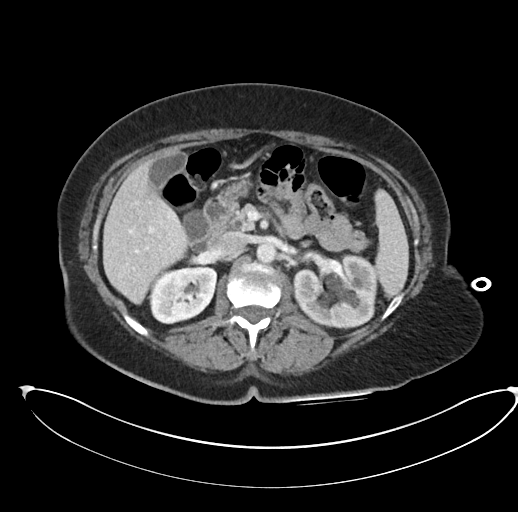
[im 67/99  soft-tissue]
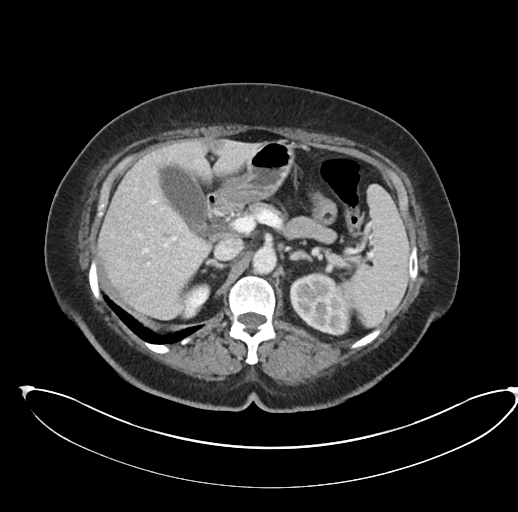
[im 67/99  bone]
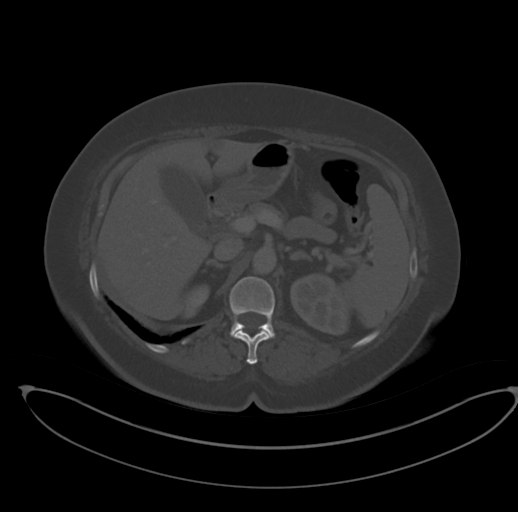
[im 76/99  soft-tissue]
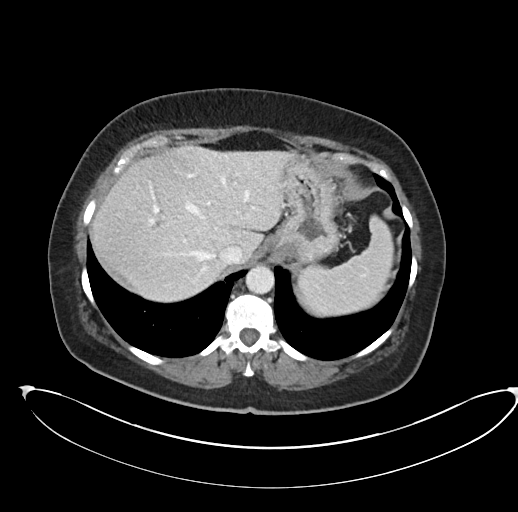
[im 85/99  soft-tissue]
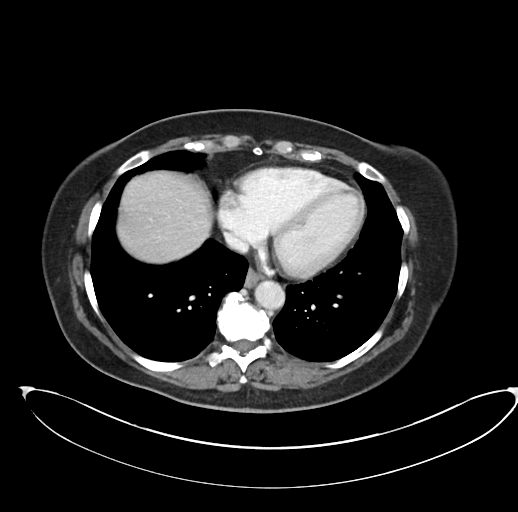
[im 94/99  soft-tissue]
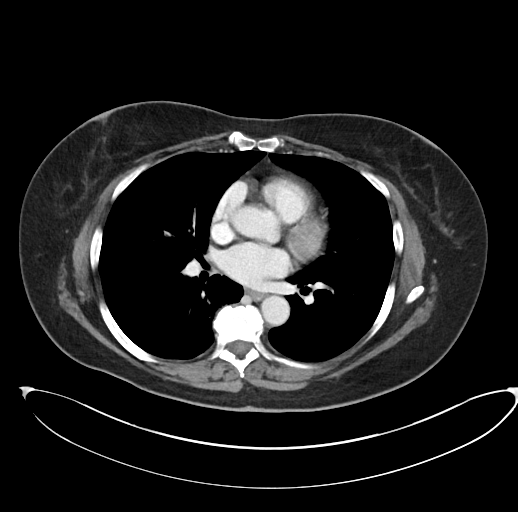

[Series 6: abd pelvis 2.00 br40 s3 cor · coronal · 0.88mm/px · 3 of 146 slices shown]
[im 49/146  soft-tissue]
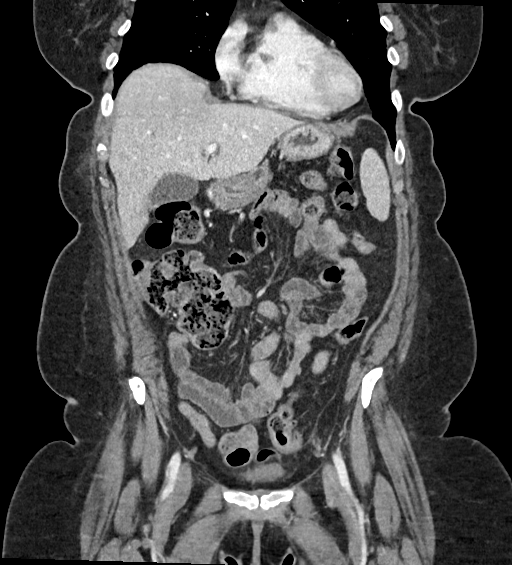
[im 65/146  soft-tissue]
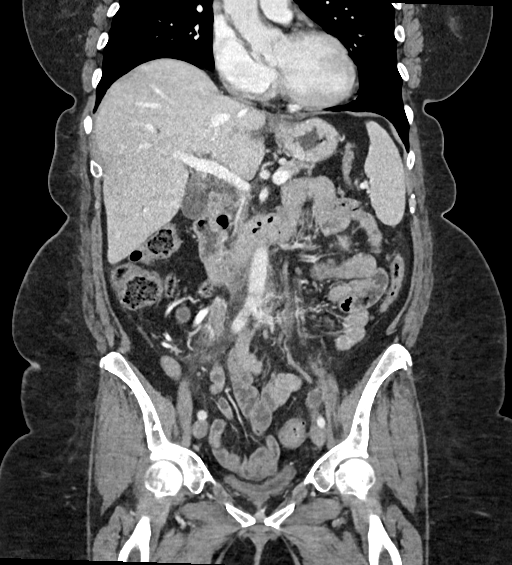
[im 81/146  soft-tissue]
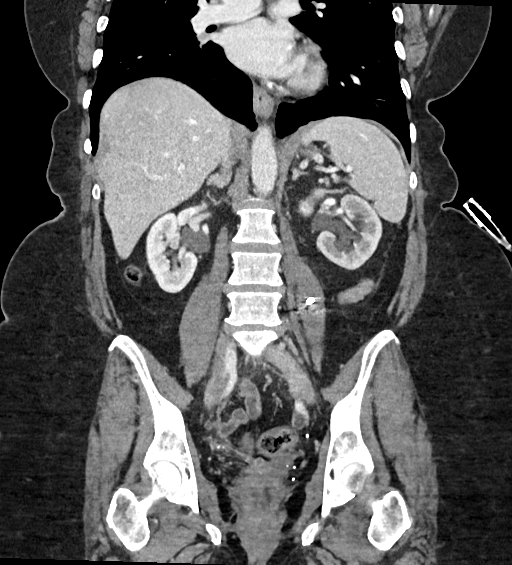

[15 of 46 positions shown; findings below may reference images not displayed]

FINDINGS: Lower chest: No acute abnormality.

Hepatobiliary: No focal liver abnormality is seen. No gallstones,
gallbladder wall thickening, or biliary dilatation.

Pancreas: Stable atrophic pancreas without evidence of mass or
inflammation.

Spleen: Normal in size without focal abnormality.

Adrenals/Urinary Tract: Adrenal glands are unremarkable. Stable mild
left hydronephrosis and hydroureter. No evidence of right-sided
hydronephrosis. Bladder is unremarkable.

Stomach/Bowel: No evidence of bowel obstruction or inflammation. No
free air.

Vascular/Lymphatic: At the level of the single remaining left-sided
retroperitoneal drain, no further residual left retroperitoneal
fluid collection is identified. There is recurrence of a small
right-sided retroperitoneal fluid collection lateral to the right
common iliac artery and iliac vein measuring approximately 1.9 cm in
greatest diameter at the level of the iliac crest. At the level of
the lower pole of the right kidney, a tiny 9 mm fluid collection is
seen just lateral to the proximal ureter. In the left pelvis, there
are 2 tiny residual lymphoceles adjacent to the left external iliac
artery measuring approximately 11 mm and 10 mm in respective
diameters.

Reproductive: Status post hysterectomy. No adnexal masses.

Other: No abdominal wall hernia or abnormality. No abdominopelvic
ascites.

Musculoskeletal: No acute or significant osseous findings.
IMPRESSION: Resolution of left-sided retroperitoneal lymphocele at the level of
the single remaining retroperitoneal drainage catheter. Small
residual lymphoceles are identified in the right retroperitoneum
lateral to the right common iliac artery and vein measuring
approximately 1.9 cm in greatest diameter and adjacent to the lower
pole of the right kidney measuring 9 mm. Two other small residual
lymphoceles are seen adjacent to the left external iliac artery
measuring 11 mm and 10 mm respectively. All of these residual small
collections are far smaller than the original collections present
postoperatively on the 04/29/2017 scan and are currently too small
to warrant drainage.

The left retroperitoneal drain was removed today. See separately
dictated clinic note in [REDACTED].
